# Patient Record
Sex: Female | Born: 1945
Health system: Southern US, Community
[De-identification: ages and names within clinical notes are randomized; demographics above are authoritative.]

## PROBLEM LIST (undated history)

## (undated) DIAGNOSIS — T7840XA Allergy, unspecified, initial encounter: Secondary | ICD-10-CM

## (undated) DIAGNOSIS — R112 Nausea with vomiting, unspecified: Secondary | ICD-10-CM

## (undated) DIAGNOSIS — I700 Atherosclerosis of aorta: Secondary | ICD-10-CM

## (undated) DIAGNOSIS — I10 Essential (primary) hypertension: Secondary | ICD-10-CM

## (undated) DIAGNOSIS — M1990 Unspecified osteoarthritis, unspecified site: Secondary | ICD-10-CM

## (undated) DIAGNOSIS — M5137 Other intervertebral disc degeneration, lumbosacral region: Secondary | ICD-10-CM

## (undated) DIAGNOSIS — M4800 Spinal stenosis, site unspecified: Secondary | ICD-10-CM

## (undated) DIAGNOSIS — Z8679 Personal history of other diseases of the circulatory system: Secondary | ICD-10-CM

## (undated) DIAGNOSIS — Z923 Personal history of irradiation: Secondary | ICD-10-CM

## (undated) DIAGNOSIS — C50919 Malignant neoplasm of unspecified site of unspecified female breast: Secondary | ICD-10-CM

## (undated) DIAGNOSIS — I517 Cardiomegaly: Secondary | ICD-10-CM

## (undated) DIAGNOSIS — Z9889 Other specified postprocedural states: Secondary | ICD-10-CM

## (undated) DIAGNOSIS — M51379 Other intervertebral disc degeneration, lumbosacral region without mention of lumbar back pain or lower extremity pain: Secondary | ICD-10-CM

## (undated) DIAGNOSIS — E0590 Thyrotoxicosis, unspecified without thyrotoxic crisis or storm: Secondary | ICD-10-CM

## (undated) DIAGNOSIS — Z9221 Personal history of antineoplastic chemotherapy: Secondary | ICD-10-CM

## (undated) DIAGNOSIS — I519 Heart disease, unspecified: Secondary | ICD-10-CM

## (undated) HISTORY — PX: OTHER SURGICAL HISTORY: SHX169

## (undated) HISTORY — DX: Unspecified osteoarthritis, unspecified site: M19.90

## (undated) HISTORY — PX: COLONOSCOPY: SHX174

## (undated) HISTORY — DX: Malignant neoplasm of unspecified site of unspecified female breast: C50.919

## (undated) HISTORY — DX: Allergy, unspecified, initial encounter: T78.40XA

---

## 1989-06-22 HISTORY — PX: LUMBAR LAMINECTOMY: SHX95

## 1990-06-22 HISTORY — PX: ABDOMINAL HYSTERECTOMY: SHX81

## 1996-06-22 HISTORY — PX: FOOT ARTHROPLASTY: SHX1657

## 1998-02-15 ENCOUNTER — Ambulatory Visit (HOSPITAL_COMMUNITY): Admission: RE | Admit: 1998-02-15 | Discharge: 1998-02-15 | Payer: Self-pay | Admitting: Podiatry

## 1999-08-22 ENCOUNTER — Other Ambulatory Visit: Admission: RE | Admit: 1999-08-22 | Discharge: 1999-08-22 | Payer: Self-pay | Admitting: Obstetrics & Gynecology

## 2000-07-22 ENCOUNTER — Ambulatory Visit (HOSPITAL_COMMUNITY): Admission: RE | Admit: 2000-07-22 | Discharge: 2000-07-22 | Payer: Self-pay | Admitting: Family Medicine

## 2000-07-22 ENCOUNTER — Encounter: Payer: Self-pay | Admitting: Family Medicine

## 2001-01-11 ENCOUNTER — Other Ambulatory Visit: Admission: RE | Admit: 2001-01-11 | Discharge: 2001-01-11 | Payer: Self-pay | Admitting: Obstetrics & Gynecology

## 2002-02-03 ENCOUNTER — Other Ambulatory Visit: Admission: RE | Admit: 2002-02-03 | Discharge: 2002-02-03 | Payer: Self-pay | Admitting: Obstetrics & Gynecology

## 2003-08-01 ENCOUNTER — Other Ambulatory Visit: Admission: RE | Admit: 2003-08-01 | Discharge: 2003-08-01 | Payer: Self-pay | Admitting: Obstetrics & Gynecology

## 2004-09-29 ENCOUNTER — Other Ambulatory Visit: Admission: RE | Admit: 2004-09-29 | Discharge: 2004-09-29 | Payer: Self-pay | Admitting: Obstetrics & Gynecology

## 2004-10-10 ENCOUNTER — Encounter: Admission: RE | Admit: 2004-10-10 | Discharge: 2004-10-10 | Payer: Self-pay | Admitting: Obstetrics & Gynecology

## 2005-12-09 ENCOUNTER — Encounter: Admission: RE | Admit: 2005-12-09 | Discharge: 2005-12-09 | Payer: Self-pay | Admitting: Obstetrics & Gynecology

## 2008-08-27 ENCOUNTER — Encounter: Admission: RE | Admit: 2008-08-27 | Discharge: 2008-08-27 | Payer: Self-pay | Admitting: Family Medicine

## 2008-09-11 ENCOUNTER — Encounter: Admission: RE | Admit: 2008-09-11 | Discharge: 2008-09-11 | Payer: Self-pay | Admitting: Family Medicine

## 2008-09-15 ENCOUNTER — Encounter: Admission: RE | Admit: 2008-09-15 | Discharge: 2008-09-15 | Payer: Self-pay | Admitting: Family Medicine

## 2009-04-26 ENCOUNTER — Encounter: Admission: RE | Admit: 2009-04-26 | Discharge: 2009-04-26 | Payer: Self-pay | Admitting: Family Medicine

## 2010-06-22 HISTORY — PX: KNEE ARTHROSCOPY: SHX127

## 2010-11-03 ENCOUNTER — Ambulatory Visit (HOSPITAL_BASED_OUTPATIENT_CLINIC_OR_DEPARTMENT_OTHER)
Admission: RE | Admit: 2010-11-03 | Discharge: 2010-11-03 | Disposition: A | Discharge status: Home / Self Care | Payer: BC Managed Care – PPO | Source: Ambulatory Visit | Attending: Specialist | Admitting: Specialist

## 2010-11-03 DIAGNOSIS — M23349 Other meniscus derangements, anterior horn of lateral meniscus, unspecified knee: Secondary | ICD-10-CM | POA: Insufficient documentation

## 2010-11-03 DIAGNOSIS — M23329 Other meniscus derangements, posterior horn of medial meniscus, unspecified knee: Secondary | ICD-10-CM | POA: Insufficient documentation

## 2010-11-03 DIAGNOSIS — M1710 Unilateral primary osteoarthritis, unspecified knee: Secondary | ICD-10-CM | POA: Insufficient documentation

## 2010-11-03 DIAGNOSIS — Z01810 Encounter for preprocedural cardiovascular examination: Secondary | ICD-10-CM | POA: Insufficient documentation

## 2010-11-03 DIAGNOSIS — Z79899 Other long term (current) drug therapy: Secondary | ICD-10-CM | POA: Insufficient documentation

## 2010-11-03 DIAGNOSIS — Z01812 Encounter for preprocedural laboratory examination: Secondary | ICD-10-CM | POA: Insufficient documentation

## 2010-11-03 LAB — POCT I-STAT 4, (NA,K, GLUC, HGB,HCT)
Hemoglobin: 13.9 g/dL (ref 12.0–15.0)
Sodium: 141 mEq/L (ref 135–145)

## 2010-11-09 NOTE — Op Note (Signed)
NAMECESAR, Monica [name]             ACCOUNT NO.:  [id number]  MEDICAL RECORD NO.:  [id number]            PATIENT TYPE:  LOCATION:                                FACILITY:  [facility]  PHYSICIAN:  Jene Every, M.D.    DATE OF BIRTH:  09/28/1945  DATE OF PROCEDURE:  11/03/2010 DATE OF DISCHARGE:                              OPERATIVE REPORT   PREOPERATIVE DIAGNOSES:  Medial and lateral meniscus tears, degenerative joint disease of the right knee.  POSTOPERATIVE DIAGNOSES:  Medial and lateral meniscus tears, degenerative joint disease of the right knee.  PROCEDURES PERFORMED: 1. Right knee arthroscopy. 2. Partial medial and lateral meniscectomy. 3. Chondroplasty of patellofemoral joint.  ANESTHESIA:  General.  ASSISTANT:  None.  BRIEF HISTORY:  This is a pleasant 65 year old female who has had knee pain, locking, and giving way.  MRI indicating meniscus tears both of the anterior horn of lateral meniscus and the medial meniscus with a flap tear.  She has had mechanical symptoms, large effusion, locking and giving way refractory to conservative treatment, indicated for arthroscopic debridement and partial meniscectomy.  Risk and benefits discussed including bleeding, infection, damage to neurovascular structures, no change in symptoms, worsening symptoms, need for repeat debridement, DVT, PE, anesthetic complications, etc.  TECHNIQUE:  The patient in supine position.  After induction of adequate general anesthesia, 2 g of Kefzol, the right lower extremity was prepped and draped in the usual sterile fashion.  A lateral parapatellar portal and a superomedial parapatellar portal was fashioned with a #11 blade. Ingress cannula atraumatically placed.  Irrigant was utilized to insufflate the joint.  Under direct visualization, medial parapatellar portal was fashioned with a #11 blade after localization with 18-gauge needle sparing the medial meniscus.  Noted  immediately was a tear  in the posterior and medial aspects of the medial meniscus.  This was resected with straight basket and further contoured with a 3.5 Cuda shaver to a stable base.  In addition, she had some couple of areas of a couple grade 3 lesions of the medial femoral condyle, near full- thickness measuring 1.5 x 1.5 cm.  We trimmed the edges of that to a stable base.  Next, after re-examining the compartment, the remnant of the meniscus was stable to probe palpation.  We examined the lateral compartment, was fairly tight in the lateral compartment, somewhat difficult to gain entry, we were able to do so though.  She had tearing along the anterior and medial aspects of the meniscus.  This was debrided with a shaver to a stable base.  We resected it to a stable base.  The remnant was stable to probe palpation.  She had some minor grade 3 changes on the femoral condyle and tibial plateau here as well, synovitis in the inner notch. Remnant of meniscus was stable to probe palpation.  Suprapatellar pouch with grade 3 changes of patella, normal patellofemoral tracking.  Light chondroplasty performed here.  We copiously lavaged the knee.  All compartments reexamined.  No further pathology amenable to arthroscopic intervention.  Therefore, we removed all instrumentation.  I flexed and extended the knee, attempted to perform a  McMurray, I had no locking or popping felt particularly in the lateral compartment or medial compartment, therefore closed the portals with 4-0 nylon simple sutures.  0.25% Marcaine with epinephrine was infiltrated in the joint.  Wound was dressed sterilely.  Awoken without difficulty and transported to recovery room in satisfactory condition.  The patient tolerated procedure well.  No complications.  No assistant.     Jene Every, M.D.     Cordelia Pen  D:  11/03/2010  T:  11/03/2010  Job:  [id number]  Electronically Signed by Jene Every M.D. on 11/09/2010 01:30:59 PM

## 2011-06-05 ENCOUNTER — Other Ambulatory Visit: Payer: Self-pay | Admitting: Family Medicine

## 2011-06-05 DIAGNOSIS — M79602 Pain in left arm: Secondary | ICD-10-CM

## 2011-06-09 ENCOUNTER — Ambulatory Visit
Admission: RE | Admit: 2011-06-09 | Discharge: 2011-06-09 | Disposition: A | Discharge status: Home / Self Care | Payer: BC Managed Care – PPO | Source: Ambulatory Visit | Attending: Family Medicine | Admitting: Family Medicine

## 2011-06-09 DIAGNOSIS — M79602 Pain in left arm: Secondary | ICD-10-CM

## 2013-06-22 DIAGNOSIS — Z923 Personal history of irradiation: Secondary | ICD-10-CM

## 2013-06-22 DIAGNOSIS — Z9221 Personal history of antineoplastic chemotherapy: Secondary | ICD-10-CM

## 2013-06-22 HISTORY — DX: Personal history of irradiation: Z92.3

## 2013-06-22 HISTORY — DX: Personal history of antineoplastic chemotherapy: Z92.21

## 2014-01-02 ENCOUNTER — Other Ambulatory Visit: Payer: Self-pay | Admitting: Obstetrics & Gynecology

## 2014-01-02 DIAGNOSIS — N63 Unspecified lump in unspecified breast: Secondary | ICD-10-CM

## 2014-01-05 ENCOUNTER — Other Ambulatory Visit: Payer: Self-pay | Admitting: Obstetrics & Gynecology

## 2014-01-05 ENCOUNTER — Ambulatory Visit
Admission: RE | Admit: 2014-01-05 | Discharge: 2014-01-05 | Disposition: A | Discharge status: Home / Self Care | Payer: Medicare (Managed Care) | Source: Ambulatory Visit | Attending: Obstetrics & Gynecology | Admitting: Obstetrics & Gynecology

## 2014-01-05 DIAGNOSIS — N63 Unspecified lump in unspecified breast: Secondary | ICD-10-CM

## 2014-01-05 DIAGNOSIS — N6320 Unspecified lump in the left breast, unspecified quadrant: Secondary | ICD-10-CM

## 2014-01-08 ENCOUNTER — Other Ambulatory Visit: Payer: Self-pay | Admitting: Obstetrics & Gynecology

## 2014-01-08 DIAGNOSIS — N6320 Unspecified lump in the left breast, unspecified quadrant: Secondary | ICD-10-CM

## 2014-01-10 ENCOUNTER — Ambulatory Visit
Admission: RE | Admit: 2014-01-10 | Discharge: 2014-01-10 | Disposition: A | Discharge status: Home / Self Care | Payer: Medicare (Managed Care) | Source: Ambulatory Visit | Attending: Obstetrics & Gynecology | Admitting: Obstetrics & Gynecology

## 2014-01-10 ENCOUNTER — Ambulatory Visit
Admission: RE | Admit: 2014-01-10 | Discharge: 2014-01-10 | Disposition: A | Discharge status: Home / Self Care | Payer: BC Managed Care – PPO | Source: Ambulatory Visit | Attending: Obstetrics & Gynecology | Admitting: Obstetrics & Gynecology

## 2014-01-10 DIAGNOSIS — N6320 Unspecified lump in the left breast, unspecified quadrant: Secondary | ICD-10-CM

## 2014-01-10 DIAGNOSIS — C50919 Malignant neoplasm of unspecified site of unspecified female breast: Secondary | ICD-10-CM

## 2014-01-10 HISTORY — DX: Malignant neoplasm of unspecified site of unspecified female breast: C50.919

## 2014-01-11 ENCOUNTER — Other Ambulatory Visit: Payer: Self-pay | Admitting: Obstetrics & Gynecology

## 2014-01-11 DIAGNOSIS — C50919 Malignant neoplasm of unspecified site of unspecified female breast: Secondary | ICD-10-CM

## 2014-01-12 ENCOUNTER — Telehealth: Payer: Self-pay | Admitting: *Deleted

## 2014-01-12 DIAGNOSIS — C50512 Malignant neoplasm of lower-outer quadrant of left female breast: Secondary | ICD-10-CM

## 2014-01-12 NOTE — Telephone Encounter (Signed)
Confirmed [facility] for 01/17/14 at 12N .  Instructions and contact information given. 

## 2014-01-16 ENCOUNTER — Ambulatory Visit
Admission: RE | Admit: 2014-01-16 | Discharge: 2014-01-16 | Disposition: A | Discharge status: Home / Self Care | Payer: Medicare (Managed Care) | Source: Ambulatory Visit | Attending: Obstetrics & Gynecology | Admitting: Obstetrics & Gynecology

## 2014-01-16 DIAGNOSIS — C50919 Malignant neoplasm of unspecified site of unspecified female breast: Secondary | ICD-10-CM

## 2014-01-16 MED ORDER — GADOBENATE DIMEGLUMINE 529 MG/ML IV SOLN
20.0000 mL | Freq: Once | INTRAVENOUS | Status: AC | PRN
  Administered 2014-01-16: 20 mL via INTRAVENOUS

## 2014-01-17 ENCOUNTER — Ambulatory Visit
Admission: RE | Admit: 2014-01-17 | Discharge: 2014-01-17 | Disposition: A | Discharge status: Home / Self Care | Payer: BC Managed Care – PPO | Source: Ambulatory Visit | Attending: Radiation Oncology | Admitting: Radiation Oncology

## 2014-01-17 ENCOUNTER — Other Ambulatory Visit (HOSPITAL_BASED_OUTPATIENT_CLINIC_OR_DEPARTMENT_OTHER): Payer: Medicare (Managed Care)

## 2014-01-17 ENCOUNTER — Encounter: Payer: Self-pay | Admitting: Oncology

## 2014-01-17 ENCOUNTER — Ambulatory Visit (HOSPITAL_BASED_OUTPATIENT_CLINIC_OR_DEPARTMENT_OTHER): Payer: BC Managed Care – PPO | Admitting: General Surgery

## 2014-01-17 ENCOUNTER — Ambulatory Visit: Payer: Medicare (Managed Care)

## 2014-01-17 ENCOUNTER — Encounter: Payer: Self-pay | Admitting: *Deleted

## 2014-01-17 ENCOUNTER — Ambulatory Visit (HOSPITAL_BASED_OUTPATIENT_CLINIC_OR_DEPARTMENT_OTHER): Payer: Medicare (Managed Care) | Admitting: Oncology

## 2014-01-17 ENCOUNTER — Ambulatory Visit: Payer: MEDICARE | Attending: General Surgery | Admitting: Physical Therapy

## 2014-01-17 VITALS — BP 148/70 | HR 68 | Temp 98.7°F | Resp 18 | Ht 66.5 in | Wt 253.9 lb

## 2014-01-17 DIAGNOSIS — C50512 Malignant neoplasm of lower-outer quadrant of left female breast: Secondary | ICD-10-CM

## 2014-01-17 DIAGNOSIS — I10 Essential (primary) hypertension: Secondary | ICD-10-CM | POA: Insufficient documentation

## 2014-01-17 DIAGNOSIS — R293 Abnormal posture: Secondary | ICD-10-CM | POA: Diagnosis not present

## 2014-01-17 DIAGNOSIS — IMO0001 Reserved for inherently not codable concepts without codable children: Secondary | ICD-10-CM | POA: Insufficient documentation

## 2014-01-17 DIAGNOSIS — C50519 Malignant neoplasm of lower-outer quadrant of unspecified female breast: Secondary | ICD-10-CM

## 2014-01-17 DIAGNOSIS — C50919 Malignant neoplasm of unspecified site of unspecified female breast: Secondary | ICD-10-CM | POA: Diagnosis not present

## 2014-01-17 DIAGNOSIS — M1710 Unilateral primary osteoarthritis, unspecified knee: Secondary | ICD-10-CM | POA: Insufficient documentation

## 2014-01-17 LAB — CBC WITH DIFFERENTIAL/PLATELET
BASO%: 0.8 % (ref 0.0–2.0)
Basophils Absolute: 0 10*3/uL (ref 0.0–0.1)
EOS ABS: 0.1 10*3/uL (ref 0.0–0.5)
EOS%: 2.3 % (ref 0.0–7.0)
HCT: 39.1 % (ref 34.8–46.6)
HGB: 13 g/dL (ref 11.6–15.9)
LYMPH%: 38.2 % (ref 14.0–49.7)
MCH: 27.7 pg (ref 25.1–34.0)
MCHC: 33.2 g/dL (ref 31.5–36.0)
MCV: 83.4 fL (ref 79.5–101.0)
MONO#: 0.5 10*3/uL (ref 0.1–0.9)
MONO%: 8.9 % (ref 0.0–14.0)
NEUT%: 49.8 % (ref 38.4–76.8)
NEUTROS ABS: 2.6 10*3/uL (ref 1.5–6.5)
Platelets: 286 10*3/uL (ref 145–400)
RBC: 4.69 10*6/uL (ref 3.70–5.45)
RDW: 14.8 % — AB (ref 11.2–14.5)
WBC: 5.2 10*3/uL (ref 3.9–10.3)
lymph#: 2 10*3/uL (ref 0.9–3.3)

## 2014-01-17 LAB — COMPREHENSIVE METABOLIC PANEL (CC13)
ALBUMIN: 3.3 g/dL — AB (ref 3.5–5.0)
ALK PHOS: 82 U/L (ref 40–150)
ALT: 14 U/L (ref 0–55)
AST: 20 U/L (ref 5–34)
Anion Gap: 8 mEq/L (ref 3–11)
BILIRUBIN TOTAL: 0.26 mg/dL (ref 0.20–1.20)
BUN: 12.7 mg/dL (ref 7.0–26.0)
CO2: 27 mEq/L (ref 22–29)
Calcium: 9.5 mg/dL (ref 8.4–10.4)
Chloride: 105 mEq/L (ref 98–109)
Creatinine: 0.9 mg/dL (ref 0.6–1.1)
GLUCOSE: 126 mg/dL (ref 70–140)
POTASSIUM: 3.3 meq/L — AB (ref 3.5–5.1)
SODIUM: 140 meq/L (ref 136–145)
TOTAL PROTEIN: 7.4 g/dL (ref 6.4–8.3)

## 2014-01-17 NOTE — Progress Notes (Signed)
Chief Complaint: New diagnosis of breast cancer  History:    Monica [name] is a 68 y.o. postmenopausal female referred by Dr. Shirlyn Goltz  for evaluation of recently diagnosed carcinoma of the left breast. She recently presented for a screening mamogram revealing 2 apparent masses in the lateral aspect of the left breast.  Subsequent imaging included diagnostic mamogram showing 2 adjacent oval poorly defined masses deep in the lower outer portion of the left breast. and ultrasound showing 2 irregular hypoechoic masses at the 4:00 position of the left breast one 20 cm from the nipple measuring 1.5 cm and another 16 cm from the nipple measuring 1 cm. 2 left axillary lymph nodes were seen with mild cortical thickening..   A ultrasound guided biopsy was performed on 01/12/2014 of the 2 breast masses and one abnormal axillary lymph node with pathology revealing infiltrating ductal carcinoma of the breast In both masses but axillary lymph node biopsy was negative for malignancy. Subsequent bilateral breast MRI was performed showing 2 masses corresponding to the known malignancies and postbiopsy change in the axilla with a mildly prominent left axillary node. The 2 masses or adjacent and total area encompassed is approximately 4 cm.. She is seen now in Breast multidisciplinary clinic for initial treatment planning.  She has experienced No breast symptoms personally..  She does not have a personal history of any previous breast problems.  Findings at that time were the following:  Tumor size: 1.4 and 1.9 cm encompassing a total of 4 cm  Tumor grade: 3  Estrogen Receptor: negative Progesterone Receptor: negative  Her-2 neu: negative  Lymph node status: negative  Past Medical History  Diagnosis Date  . Allergy codeine    rapid heart beat, cold sweat  . Arthritis   . Breast cancer     No past surgical history on file.  Current Outpatient Prescriptions  Medication Sig Dispense Refill  .  amLODipine (NORVASC) 5 MG tablet Take 5 mg by mouth daily.      . Cholecalciferol (VITAMIN D3) 2000 UNITS TABS Take 1 tablet by mouth daily.      [name] docusate sodium (COLACE) 100 MG capsule Take 100 mg by mouth 2 (two) times daily.      [name] estradiol (ESTRACE) 1 MG tablet Take 2 mg by mouth daily.      . Ginkgo Biloba 120 MG CAPS Take 1 capsule by mouth daily.      . meloxicam (MOBIC) 15 MG tablet Take 15 mg by mouth daily as needed for pain.      [name] UNABLE TO FIND Take 1 tablet by mouth daily. OMEGA XL      . valsartan-hydrochlorothiazide (DIOVAN-HCT) 320-25 MG per tablet Take 1 tablet by mouth daily.       No current facility-administered medications for this visit.    Family History  Problem Relation Age of Onset  . Cancer Sister   . Cancer Brother   . Cancer Maternal Aunt   . Cancer Paternal Aunt     History   Social History  . Marital Status: Married    Spouse Name: N/A    Number of Children: N/A  . Years of Education: N/A   Social History Main Topics  . Smoking status: Never Smoker   . Smokeless tobacco: Not on file  . Alcohol Use: No  . Drug Use: Not on file  . Sexual Activity: Not on file   Other Topics Concern  . Not on file   Social  History Narrative  . No narrative on file     Review of Systems Constitutional: negative Respiratory: negative Cardiovascular: negative Gastrointestinal: negative     Objective:  There were no vitals taken for this visit.  General: Alert, Obese African American female, in no distress Skin: Warm and dry without rash or infection. HEENT: No palpable masses or thyromegaly. Sclera nonicteric. Pupils equal round and reactive. Oropharynx clear. Breasts: There is a palpable mass in the extreme lateral aspect of the left breast measuring in total approximately 4-5 cm anterior to posterior. May represent some postbiopsy change. Lymph nodes: No cervical, supraclavicular, or inguinal nodes palpable. Lungs: Breath sounds clear and equal  without increased work of breathing Cardiovascular: Regular rate and rhythm without murmur. No JVD or edema. Peripheral pulses intact. Abdomen: Nondistended. Soft and nontender. No masses palpable. No organomegaly. No palpable hernias. Extremities: No edema or joint swelling or deformity. No chronic venous stasis changes. Neurologic: Alert and fully oriented. Gait normal.   Laboratory data:  CBC:  Lab Results  Component Value Date   WBC 5.2 01/17/2014   RBC 4.69 01/17/2014   HGB 13.0 01/17/2014   HGB 13.9 11/03/2010   HCT 39.1 01/17/2014   HCT 41.0 11/03/2010   PLT 286 01/17/2014  ]  [facility] Labs:  Lab Results  Component Value Date   NA 140 01/17/2014   NA 141 11/03/2010   K 3.3* 01/17/2014   K 3.3* 11/03/2010   CO2 27 01/17/2014   BUN 12.7 01/17/2014   CREATININE 0.9 01/17/2014   CALCIUM 9.5 01/17/2014   PROT 7.4 01/17/2014   BILITOT 0.26 01/17/2014   ALKPHOS 82 01/17/2014   AST 20 01/17/2014   ALT 14 01/17/2014     Assessment  68 y.o. female with a new diagnosis of cancer of the the left breast lower outer quadrant.  Clinical IIA, estrogen receptor negative, progesterone receptor negative and Her2/Neu protein/oncogene negative. Estrogen receptor is going to be repeated although was felt to likely be negative. I discussed with the patient and family members present today initial surgical treatment options. We discussed options of breast conservation with lumpectomy or total mastectomy and sentinal lymph node biopsy/dissection. Options for reconstruction were discussed. Although she has 2 separate masses they are small and adjacent and could be encompassed with a reasonable sized partial mastectomy in an area where there is plenty of breast tissue. After discussion they have elected to proceed with left partial mastectomy.  We discussed the indications and nature of the procedure, and expected recovery, in detail. Surgical risks including anesthetic complications, cardiorespiratory complications,  bleeding, infection, wound healing complications, blood clots, lymphedema, local and distant recurrence and possible need for further surgery based on the final pathology was discussed and understood.  Chemotherapy, hormonal therapy and radiation therapy have been discussed. They have been provided with literature regarding the treatment of breast cancer.  All questions were answered. They understand and agree to proceed and we will go ahead with scheduling.  Plan left partial mastectomy Left axillary sentinel lymph node biopsy. Will ask for the 2 lesions to be bracketed. She may require chemotherapy if she is triple negative and we discussed potentially placing a Port-A-Cath at the time of her breast surgery.  [name] MD, FACS  01/17/2014, 3:34 PM

## 2014-01-17 NOTE — Progress Notes (Signed)
Checked in new patient with no financial issues prior to seeing the dr. She has appt card and I advised of [employer] [facility]-she said may not qualify for it. She has not been out of the country and I gave her [facility] packet.

## 2014-01-17 NOTE — Progress Notes (Signed)
[place]  Telephone:(336) [phone] Fax:(336) [phone]     ID: SAHEJ HAUSWIRTH DOB: 10-01-1945  MR#: [id number]  HBZ#:[id number]  Patient Care Team: Maury Dus, MD as PCP - General (Family Medicine) [name], MD as Consulting Physician (General Surgery) Chauncey Cruel, MD as Consulting Physician (Oncology) Marye Round, MD as Consulting Physician (Radiation Oncology) Maisie Fus, MD as Consulting Physician (Obstetrics and Gynecology)   CHIEF COMPLAINT: Newly diagnosed breast cancer  CURRENT TREATMENT: Awaiting definitive surgery   BREAST CANCER HISTORY: [facility] showing a possible mass in her left breast. On 01/05/2014 left diagnostic mammography and ultrasonography at the breast Center showed 2 adjacent poorly defined masses deep in the lower outer portion of the left breast. On physical exam these were palpable measuring approximately 3 cm altogether, at the 4:00 position 16 cm from the left nipple. There were no palpable left axillary lymph nodes. Ultrasound confirmed to and adjacent irregularly marginated hypoechoic masses in the area in question, measuring 1.5 and 1.0 cm respectively. The total area of by ultrasonography measured 2.6 cm. In addition, to left axillary lymph nodes showed mild cortical thickening in one of them showed loss of the normal fatty hilum.  On 01/10/2014 the patient underwent separate biopsies of both the left breast masses in question as well as the more suspicious lymph node. The final pathology ([id number]) showed the lymph node to be benign. (This was interpreted as concordant). Both of the breast masses, however, were positive for an invasive ductal carcinoma, which was HER-2 not amplified, with the signals ratio of 0.98 and the number per cell being 2.45. Estrogen and progesterone receptor determination is pending but looks negative at this point.  On  01/16/2014 the patient underwent bilateral breast MRI the right breast was unremarkable. In the left breast lower outer quadrant there were 2 oval masses measuring 1.5 and 2.2 cm. Taken together they was approximately 4 cm of disease. There were no findings of concern in the right breast. In the left axilla there was a single mildly prominent lymph node. There was no suspicious internal mammary adenopathy.  The patient's subsequent history is as detailed below  INTERVAL HISTORY: Marcelle was evaluated in the multidisciplinary breast cancer clinic 01/17/2014 accompanied by her husband [name] and her. God-daughter [name]. The patient's case was also discussed at the multidisciplinary breast cancer conference the same morning  REVIEW OF SYSTEMS: There were no specific symptoms leading to the original mammogram, which was routinely scheduled. The patient denies unusual headaches, visual changes, nausea, vomiting, stiff neck, dizziness, or gait imbalance. There has been no cough, phlegm production, or pleurisy, no chest pain or pressure, and no change in bowel or bladder habits. The patient denies fever, rash, bleeding, unexplained fatigue or unexplained weight loss. The patient admits to some joint pain "here and there", which is not more persistent or intense than prior. A detailed review of systems was otherwise entirely negative.  PAST MEDICAL HISTORY: Past Medical History  Diagnosis Date  . Allergy codeine    rapid heart beat, cold sweat  . Arthritis   . Breast cancer     PAST SURGICAL HISTORY: No past surgical history on file.  FAMILY HISTORY Family History  Problem Relation Age of Onset  . Cancer Sister   . Cancer Brother   . Cancer Maternal Aunt   . Cancer Paternal Aunt    the patient's father died at the age of 75 from  a stroke. The patient's mother died at the age of 43 from "multiple causes". The patient had 2 brothers and 2 sisters. The patient's sister was diagnosed with uterine  cancer at the age of 2, and the patient's brother [name] had "bone cancer" and died in his 43s. He was treated at the [facility]. There is no history of breast or ovarian cancer in the family to the patient's knowledge.  GYNECOLOGIC HISTORY:  No LMP recorded. Menarche age 70, the patient is GX P0. She underwent hysterectomy in 1992, with no salpingo-oophorectomy. She has been on estrogen replacement since. 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 Her husband [name] worked for [employer] in [place] as a Facilities manager. The patient's adopted son [name] works as a laborer in [place]. The patient's god-daughter [name] is an Optometrist. The patient has no grandchildren. The patient attends a local church of  [place], and her husband [name] is pastor    ADVANCED DIRECTIVES: In place   HEALTH MAINTENANCE: History  Substance Use Topics  . Smoking status: Never Smoker   . Smokeless tobacco: Not on file  . Alcohol Use: No     Colonoscopy: October 2008  PAP: July 2015  Bone density: 01/01/2014  Lipid panel:  Allergies  Allergen Reactions  . Codeine     Current Outpatient Prescriptions  Medication Sig Dispense Refill  . amLODipine (NORVASC) 5 MG tablet Take 5 mg by mouth daily.      . Cholecalciferol (VITAMIN D3) 2000 UNITS TABS Take 1 tablet by mouth daily.      [name] docusate sodium (COLACE) 100 MG capsule Take 100 mg by mouth 2 (two) times daily.      [name] estradiol (ESTRACE) 1 MG tablet Take 2 mg by mouth daily.      . Ginkgo Biloba 120 MG CAPS Take 1 capsule by mouth daily.      . meloxicam (MOBIC) 15 MG tablet Take 15 mg by mouth daily as needed for pain.      [name] UNABLE TO FIND Take 1 tablet by mouth daily. OMEGA XL      . valsartan-hydrochlorothiazide (DIOVAN-HCT) 320-25 MG per tablet Take 1 tablet by mouth daily.       No current  facility-administered medications for this visit.    OBJECTIVE: Middle-aged [place] American woman in no acute distress Filed Vitals:   01/17/14 1248  BP: 148/70  Pulse: 68  Temp: 98.7 F (37.1 C)  Resp: 18     Body mass index is 40.37 kg/(m^2).    ECOG FS:0 - Asymptomatic  Ocular: Sclerae unicteric, pupils equal, round and reactive to light Ear-nose-throat: Oropharynx clear and moist Lymphatic: No cervical or supraclavicular adenopathy Lungs no rales or rhonchi, good excursion bilaterally Heart regular rate and rhythm, no murmur appreciated Abd soft, obese, nontender, positive bowel sounds MSK no focal spinal tenderness, no joint edema Neuro: non-focal, well-oriented, appropriate affect Breasts: The right breast is unremarkable. There is a small ecchymosis in the left breast lower outer quadrant. I do not palpate a well-defined mass. There are no other skin or nipple changes of concern. The left axilla is benign.   LAB RESULTS:  CMP     Component Value Date/Time   NA 140 01/17/2014 1219   NA 141 11/03/2010 1352   K 3.3* 01/17/2014 1219   K 3.3* 11/03/2010 1352   CO2 27  01/17/2014 1219   GLUCOSE 126 01/17/2014 1219   GLUCOSE 83 11/03/2010 1352   BUN 12.7 01/17/2014 1219   CREATININE 0.9 01/17/2014 1219   CALCIUM 9.5 01/17/2014 1219   PROT 7.4 01/17/2014 1219   ALBUMIN 3.3* 01/17/2014 1219   AST 20 01/17/2014 1219   ALT 14 01/17/2014 1219   ALKPHOS 82 01/17/2014 1219   BILITOT 0.26 01/17/2014 1219    I No results found for this basename: SPEP,  UPEP,   kappa and lambda light chains    Lab Results  Component Value Date   WBC 5.2 01/17/2014   NEUTROABS 2.6 01/17/2014   HGB 13.0 01/17/2014   HCT 39.1 01/17/2014   MCV 83.4 01/17/2014   PLT 286 01/17/2014      Chemistry      Component Value Date/Time   NA 140 01/17/2014 1219   NA 141 11/03/2010 1352   K 3.3* 01/17/2014 1219   K 3.3* 11/03/2010 1352   CO2 27 01/17/2014 1219   BUN 12.7 01/17/2014 1219   CREATININE 0.9 01/17/2014 1219       Component Value Date/Time   CALCIUM 9.5 01/17/2014 1219   ALKPHOS 82 01/17/2014 1219   AST 20 01/17/2014 1219   ALT 14 01/17/2014 1219   BILITOT 0.26 01/17/2014 1219       No results found for this basename: LABCA2    No components found with this basename: LABCA125    No results found for this basename: INR,  in the last 168 hours  Urinalysis No results found for this basename: colorurine,  appearanceur,  labspec,  phurine,  glucoseu,  hgbur,  bilirubinur,  ketonesur,  proteinur,  urobilinogen,  nitrite,  leukocytesur    STUDIES: Mr Breast Bilateral W Wo Contrast  01/16/2014   ADDENDUM REPORT: 01/16/2014 12:16  ADDENDUM: The enhancing masses in the left breast measure 1.4 x 1.5 x 1.6 cm (more anterior lesion) and 1.9 x 2.2 x 1.6 cm (more posterior lesion). Measured together there is approximately 4.0 cm of disease measured anterior to posterior, inclusive of both lesions.   Electronically Signed   By: Shon Hale M.D.   On: 01/16/2014 12:16   01/16/2014   CLINICAL DATA:  Recent left breast biopsy shows grade 3 invasive ductal carcinoma at 2 sites in the left breast 4 o'clock location, 16 and 20 cm from the nipple. A left axillary lymph node was biopsied and was benign.  LABS:  BUN and creatinine were obtained on site at [facility] at  [address]  Results:  BUN 14 mg/dL,  Creatinine 0.9 mg/dL.  EXAM: BILATERAL BREAST MRI WITH AND WITHOUT CONTRAST  TECHNIQUE: Multiplanar, multisequence MR images of both breasts were obtained prior to and following the intravenous administration of 60m of MultiHance.  THREE-DIMENSIONAL MR IMAGE RENDERING ON INDEPENDENT WORKSTATION:  Three-dimensional MR images were rendered by post-processing of the original MR data on an independent workstation. The three-dimensional MR images were interpreted, and findings are reported in the following complete MRI report for this study. Three dimensional images were evaluated at the independent DynaCad  workstation  COMPARISON:  [facility] and earlier  FINDINGS: Breast composition: c:  Heterogeneous fibroglandular tissue  Background parenchymal enhancement: Moderate  Right breast: A skin lesion in the the lower central portion of the right breast shows enhancement, consistent with a skin lesion previously marked mammographically. No suspicious  parenchymal abnormality identified.  Left breast: Within the lower outer quadrant of the left breast there are 2 oval masses with irregular margins. Each of these demonstrates homogeneous internal enhancement and shows rapid wash-in and washout type enhancement kinetics. Each mass contains tissue marker clip artifact from recent core biopsies. No other suspicious enhancement identified within the left breast.  Lymph nodes: There is post biopsy change within the left axilla following recent ultrasound-guided core biopsy of a left axillary lymph node. There is a single mildly prominent left axillary lymph node. Other are nodes have normal morphology. No suspicious internal mammary nodes are identified.  Ancillary findings:  None.  IMPRESSION: 1. Two masses within the lower outer quadrant of the left breast consistent with known malignancy based on recent core biopsies. 2. No evidence for contralateral malignancy.  RECOMMENDATION: Treatment plan is recommended.  BI-RADS CATEGORY  6: Known biopsy-proven malignancy.  Electronically Signed: By: Shon Hale M.D. On: 01/16/2014 11:58   Mm Digital Diagnostic Unilat L  01/10/2014   CLINICAL DATA:  Post left breast ultrasound-guided core biopsies.  EXAM: DIAGNOSTIC left breast MAMMOGRAM POST ULTRASOUND BIOPSY  COMPARISON:  Previous examinations.  FINDINGS: Mammographic images were obtained following ultrasound guided biopsy of left breast masses located at the 4 o'clock position 16 cm and 20 cm from  the nipple. The ribbon shaped clip is in appropriate position associated with the mass located at the 4 o'clock position 16 cm from the nipple. The wing shaped clip is in appropriate position associated with the mass located at the 4 o'clock position 20 cm from the nipple.  IMPRESSION: Appropriate positioning of clips as discussed above following ultrasound-guided core biopsy.  Final Assessment: Post Procedure Mammograms for Marker Placement   Electronically Signed   By: Luberta Robertson M.D.   On: 01/10/2014 13:10   Mm Digital Diagnostic Unilat L  01/05/2014   CLINICAL DATA:  Possible left breast masses at recent screening mammography. The patient also reports that her physician felt a mass in the 5 o'clock position of the left breast following her screening mammogram.  EXAM: DIGITAL DIAGNOSTIC  LEFT MAMMOGRAM WITH CAD  ULTRASOUND LEFT BREAST  COMPARISON:  Previous examinations, including the screening mammogram performed at [facility] on 01/01/2014.  ACR Breast Density Category b: There are scattered areas of fibroglandular density.  FINDINGS: Spot compression, spot tangential and laterally exaggerated craniocaudal views of the left breast were obtained. These demonstrate two adjacent oval, poorly defined masses deep in the lower outer portion of the left breast.  Mammographic images were processed with CAD.  On physical exam, there is an approximately 3 x 1.5 cm palpable mass in the 4 o'clock position of the left breast, 16-20 cm from the nipple. There are no palpable left axillary lymph nodes.  Ultrasound is performed, showing two adjacent rounded, irregularly marginated hypoechoic masses in the 4 o'clock position of the left breast. One is 20 cm from the nipple and measures 1.5 x 1.4 x 1.2 cm. The other is 16 cm from the nipple and measures 1.0 x 0.8 x 0.8 cm. There are also 2 left axillary lymph nodes with mild diffuse cortical thickening. One of these demonstrates loss of the normal  fatty hilum.  IMPRESSION: Two left breast masses highly suspicious for malignancy and two left axillary lymph nodes suspicious for metastatic adenopathy.  RECOMMENDATION: Ultrasound-guided core needle biopsies of the two left breast masses and one of the left axillary lymph nodes. These have been scheduled for 10  a.m. on 01/10/2014.  I have discussed the findings and recommendations with the patient. Results were also provided in writing at the conclusion of the visit. If applicable, a reminder letter will be sent to the patient regarding the next appointment.  BI-RADS CATEGORY  5: Highly suggestive of malignancy.   Electronically Signed   By: Enrique Sack M.D.   On: 01/05/2014 08:31   US Breast Ltd Uni Left Inc Axilla  01/05/2014   CLINICAL DATA:  Possible left breast masses at recent screening mammography. The patient also reports that her physician felt a mass in the 5 o'clock position of the left breast following her screening mammogram.  EXAM: DIGITAL DIAGNOSTIC  LEFT MAMMOGRAM WITH CAD  ULTRASOUND LEFT BREAST  COMPARISON:  Previous examinations, including the screening mammogram performed at [facility] on 01/01/2014.  ACR Breast Density Category b: There are scattered areas of fibroglandular density.  FINDINGS: Spot compression, spot tangential and laterally exaggerated craniocaudal views of the left breast were obtained. These demonstrate two adjacent oval, poorly defined masses deep in the lower outer portion of the left breast.  Mammographic images were processed with CAD.  On physical exam, there is an approximately 3 x 1.5 cm palpable mass in the 4 o'clock position of the left breast, 16-20 cm from the nipple. There are no palpable left axillary lymph nodes.  Ultrasound is performed, showing two adjacent rounded, irregularly marginated hypoechoic masses in the 4 o'clock position of the left breast. One is 20 cm from the nipple and measures 1.5 x 1.4 x 1.2 cm. The other is 16 cm  from the nipple and measures 1.0 x 0.8 x 0.8 cm. There are also 2 left axillary lymph nodes with mild diffuse cortical thickening. One of these demonstrates loss of the normal fatty hilum.  IMPRESSION: Two left breast masses highly suspicious for malignancy and two left axillary lymph nodes suspicious for metastatic adenopathy.  RECOMMENDATION: Ultrasound-guided core needle biopsies of the two left breast masses and one of the left axillary lymph nodes. These have been scheduled for 10 a.m. on 01/10/2014.  I have discussed the findings and recommendations with the patient. Results were also provided in writing at the conclusion of the visit. If applicable, a reminder letter will be sent to the patient regarding the next appointment.  BI-RADS CATEGORY  5: Highly suggestive of malignancy.   Electronically Signed   By: Enrique Sack M.D.   On: 01/05/2014 08:31   [place] Lt Breast Bx W Loc Dev Ea Add Lesion Img Bx Spec US Guide  01/16/2014   ADDENDUM REPORT: 01/15/2014 08:12  ADDENDUM: Pathology revealed grade 3 invasive ductal carcinoma in the left breast 4:00, 16 cm from the nipple and 20 cm from the nipple masses. The left axillary lymph node that was biopsied was benign. This was found to be concordant by Dr. Curlene Dolphin. Pathology was discussed with the patient by telephone and her questions were answered. Post biopsy instructions and care were reviewed. She reported doing well after the biopsies with tenderness at the sites. Appointments have been made for a bilateral breast MRI on January 16, 2014 and [facility] on January 17, 2014. She was encouraged to come to [facility] for educational materials. My number was provided for additional concerns and questions.  Pathology results reported by Susa Raring RN, BSN on January 15, 2014.   Electronically Signed   By: Kathryne Sharper.D.  On: 01/15/2014 08:12   01/16/2014   CLINICAL DATA:  Left breast masses  located at the 4 o'clock position 16 and 20 cm from the nipple and possible left axillary adenopathy. Ultrasound-guided core biopsy recommended.  EXAM: ULTRASOUND GUIDED LEFT AXILLARY LYMPH NODE BIOPSY  COMPARISON:  Previous exams.  FINDINGS: I met with the patient and we discussed the procedure of ultrasound-guided biopsy, including benefits and alternatives. We discussed the high likelihood of a successful procedure. We discussed the risks of the procedure, including infection, bleeding, tissue injury, clip migration, and inadequate sampling. Informed written consent was given. The usual time-out protocol was performed immediately prior to the procedure.  Using sterile technique and 2% Lidocaine as local anesthetic, under direct ultrasound visualization, a 14 gauge spring-loaded device was used to perform biopsy of the prominent level 1 left axillary lymph node with cortical thickening using a lateral approach. No clip was deployed within the lymph node.  IMPRESSION: Ultrasound guided biopsy of the prominent left axillary lymph node (level 1) as discussed above. No apparent complications.  Electronically Signed: By: Luberta Robertson M.D. On: 01/10/2014 13:08    ASSESSMENT: 68 y.o. [place] woman status post left breast biopsy of 2 separate lower outer quadrant masses 01/10/2014 for a multifocal cT2 cNX, stage II invasive ductal carcinoma, grade 3, with an MIB-1 of 90%, HER-2 negative, estrogen and progesterone receptor likely negative but definitive results pending.  (1) a suspicious left axillary lymph node biopsied 01/10/2014 was negative (concordant)  PLAN: We spent the better part of today's hour-long appointment discussing the biology of breast cancer in general, and the specifics of the patient's tumor in particular. We reviewed the difference between local and systemic treatment. From the point of view of local therapy, the patient will need surgery and radiation.   She will need systemic  treatment, and is not a candidate for anti-HER-2 immunotherapy. If the tumor is estrogen receptor positive, we will send an Oncotype to help with the chemotherapy decision. This was discussed extensively with the patient and her family. However if the tumor proves to be triple negative, as we expect, she will definitely need chemotherapy as that would be her only option for systemic treatment.  Accordingly we are setting her up for "chemotherapy school". Once we have the Oncotype report, is it confirms estrogen and progesterone negativity, we will ask that a port be placed at the time of surgery. The patient will see me again in approximately one month. By then we should be able to make a definitive decision regarding her systemic therapy, which will most likely consist of doxorubicin and cyclophosphamide in dose dense fashion x4 followed by weekly paclitaxel and carboplatin x12.  The patient has a good understanding of the overall plan. She agrees with it. She knows the goal of treatment in her case is cure. She will call with any problems that may develop before her next visit here.  Chauncey Cruel, MD   01/17/2014 3:12 PM  Addendum (01/21/2014): The patient's final pathology report confirms her tumor is estrogen receptor negative, progesterone receptor "positive" at 3%, with weak staining intensity. This is essentially a triple negative tumor and will be treated as such. Accordingly Oncotype will not be sent and the patient will need a port at the time of her lumpectomy.

## 2014-01-18 ENCOUNTER — Telehealth: Payer: Self-pay | Admitting: *Deleted

## 2014-01-18 ENCOUNTER — Encounter: Payer: Self-pay | Admitting: Specialist

## 2014-01-18 NOTE — Telephone Encounter (Signed)
Spoke to pt about repeat ER/PR results. Informed pt ER- PR 2%. Discussed the need for chemotherapy per her discussion with Dr. Jana Hakim. Informed we will send her to chemo class and get an echo. Reiterated she will have surgery first then f/u with Dr. Jana Hakim to further discuss the need for chemotherapy, s/e, etc. Received verbal understanding. Pt denies questions or concerns regarding dx or treatment care plan. Encourage pt to call with needs. Contact information given.

## 2014-01-19 ENCOUNTER — Other Ambulatory Visit: Payer: Self-pay | Admitting: *Deleted

## 2014-01-19 DIAGNOSIS — C50512 Malignant neoplasm of lower-outer quadrant of left female breast: Secondary | ICD-10-CM

## 2014-01-22 ENCOUNTER — Telehealth: Payer: Self-pay | Admitting: Oncology

## 2014-01-22 ENCOUNTER — Other Ambulatory Visit: Payer: Self-pay | Admitting: *Deleted

## 2014-01-22 NOTE — Telephone Encounter (Signed)
per response from [facility] in [place] - no preauth needed for echo. called pt and s/w pt re appt for echo 8/12 @ 9am at [facility]. also confirmed appts for 8/12 ched and 8/28 GM. these appts were mailed earlier.

## 2014-01-22 NOTE — Telephone Encounter (Signed)
lmonvm for pt re appts for 8/12 and 8/28. scheduled mailed. echo to [facility] for [place]. appt for 8/28 scheduled at 1pm due to 230pm slot taken.

## 2014-01-23 ENCOUNTER — Other Ambulatory Visit (INDEPENDENT_AMBULATORY_CARE_PROVIDER_SITE_OTHER): Payer: Self-pay | Admitting: General Surgery

## 2014-01-23 DIAGNOSIS — C50912 Malignant neoplasm of unspecified site of left female breast: Secondary | ICD-10-CM

## 2014-01-24 NOTE — Progress Notes (Signed)
Radiation Oncology         ([phone]) [phone] ________________________________  Name: Monica [name] MRN: [id number]  Date: 01/17/2014  DOB: 1946-04-03  XL:KGMWN,UUVOZD ALEXANDER, MD  Hoxworth, Darene Lamer, MD     REFERRING PHYSICIAN: Excell Seltzer Darene Lamer, MD   DIAGNOSIS: The encounter diagnosis was Breast cancer of lower-outer quadrant of left female breast.   HISTORY OF PRESENT ILLNESS::Monica [name] is a 68 y.o. female who is seen for an initial consultation visit. The patient is seen today in multidisciplinary breast clinic for her new diagnosis of left-sided breast cancer. The patient was found to have 2 possible masses within the lateral aspect of the left breast on screening mammography. She therefore proceeded with diagnostic mammogram which showed 2 adjacent masses within the lower outer portion of the left breast. This was confirmed on ultrasound. 2 left axillary lymph nodes were also seen with some mild thickening of the cortex.  An ultrasound guided biopsy was performed. Both of the masses from the for clock position of the left breast returned positive for invasive ductal carcinoma. The biopsy of a left axillary lymph node was negative. Receptor studies are pending with respect to estrogen and progesterone receptor. HER-2/neu is negative.  The patient has proceeded with an MRI scan of the breasts bilaterally. She masses were seen within the lower outer quadrant of the left breast consistent with recent positive biopsies. No evidence of contralateral malignancy. Postbiopsy change was present within the left axilla. A single mildly prominent left axillary lymph node was present. No other suspicious findings with respect to lymph nodes was present.   PREVIOUS RADIATION THERAPY: No   PAST MEDICAL HISTORY:  has a past medical history of Allergy (codeine); Arthritis; and Breast cancer.     PAST SURGICAL HISTORY:No past surgical history on file.   FAMILY HISTORY: family history  includes Cancer in her brother, maternal aunt, paternal aunt, and sister.   SOCIAL HISTORY:  reports that she has never smoked. She does not have any smokeless tobacco history on file. She reports that she does not drink alcohol.   ALLERGIES: Codeine   MEDICATIONS:  Current Outpatient Prescriptions  Medication Sig Dispense Refill  . amLODipine (NORVASC) 5 MG tablet Take 5 mg by mouth daily.      . Cholecalciferol (VITAMIN D3) 2000 UNITS TABS Take 1 tablet by mouth daily.      [name] docusate sodium (COLACE) 100 MG capsule Take 100 mg by mouth 2 (two) times daily.      [name] estradiol (ESTRACE) 1 MG tablet Take 2 mg by mouth daily.      . Ginkgo Biloba 120 MG CAPS Take 1 capsule by mouth daily.      . meloxicam (MOBIC) 15 MG tablet Take 15 mg by mouth daily as needed for pain.      [name] UNABLE TO FIND Take 1 tablet by mouth daily. OMEGA XL      . valsartan-hydrochlorothiazide (DIOVAN-HCT) 320-25 MG per tablet Take 1 tablet by mouth daily.       No current facility-administered medications for this encounter.     REVIEW OF SYSTEMS:  A 15 point review of systems is documented in the electronic medical record. This was obtained by the nursing staff. However, I reviewed this with the patient to discuss relevant findings and make appropriate changes.  Pertinent items are noted in HPI.    PHYSICAL EXAM:  vitals were not taken for this visit.  ECOG = 1  0 - Asymptomatic (Fully  active, able to carry on all predisease activities without restriction)  1 - Symptomatic but completely ambulatory (Restricted in physically strenuous activity but ambulatory and able to carry out work of a light or sedentary nature. For example, light housework, office work)  2 - Symptomatic, <50% in bed during the day (Ambulatory and capable of all self care but unable to carry out any work activities. Up and about more than 50% of waking hours)  3 - Symptomatic, >50% in bed, but not bedbound (Capable of only limited  self-care, confined to bed or chair 50% or more of waking hours)  4 - Bedbound (Completely disabled. Cannot carry on any self-care. Totally confined to bed or chair)  5 - Death   Eustace Pen MM, Creech RH, Tormey DC, et al. [phone]). "Toxicity and response criteria of the [facility]". [place] Oncol. 5 (6): 649-55  General: Well-developed, in no acute distress HEENT: Normocephalic, atraumatic; oral cavity clear Neck: Supple without any lymphadenopathy Cardiovascular: Regular rate and rhythm Respiratory: Clear to auscultation bilaterally Breasts: Palpable mass is present within the lateral aspect of the left breast inferiorly. This measured approximately 4 cm. No left-sided lymphadenopathy is present. Negative breast exam on the right and no suspicious lymph nodes within the right axilla. GI: Soft, nontender, normal bowel sounds Extremities: No edema present Neuro: No focal deficits     LABORATORY DATA:  Lab Results  Component Value Date   WBC 5.2 01/17/2014   HGB 13.0 01/17/2014   HCT 39.1 01/17/2014   MCV 83.4 01/17/2014   PLT 286 01/17/2014   Lab Results  Component Value Date   NA 140 01/17/2014   K 3.3* 01/17/2014   CO2 27 01/17/2014   Lab Results  Component Value Date   ALT 14 01/17/2014   AST 20 01/17/2014   ALKPHOS 82 01/17/2014   BILITOT 0.26 01/17/2014      RADIOGRAPHY: Mr Breast Bilateral W Wo Contrast  01/16/2014   ADDENDUM REPORT: 01/16/2014 12:16  ADDENDUM: The enhancing masses in the left breast measure 1.4 x 1.5 x 1.6 cm (more anterior lesion) and 1.9 x 2.2 x 1.6 cm (more posterior lesion). Measured together there is approximately 4.0 cm of disease measured anterior to posterior, inclusive of both lesions.   Electronically Signed   By: Shon Hale M.D.   On: 01/16/2014 12:16   01/16/2014   CLINICAL DATA:  Recent left breast biopsy shows grade 3 invasive ductal carcinoma at 2 sites in the left breast 4 o'clock location, 16 and 20 cm from the nipple.  A left axillary lymph node was biopsied and was benign.  LABS:  BUN and creatinine were obtained on site at [facility] at  [address]  Results:  BUN 14 mg/dL,  Creatinine 0.9 mg/dL.  EXAM: BILATERAL BREAST MRI WITH AND WITHOUT CONTRAST  TECHNIQUE: Multiplanar, multisequence MR images of both breasts were obtained prior to and following the intravenous administration of 30m of MultiHance.  THREE-DIMENSIONAL MR IMAGE RENDERING ON INDEPENDENT WORKSTATION:  Three-dimensional MR images were rendered by post-processing of the original MR data on an independent workstation. The three-dimensional MR images were interpreted, and findings are reported in the following complete MRI report for this study. Three dimensional images were evaluated at the independent DynaCad workstation  COMPARISON:  [facility] and earlier  FINDINGS: Breast composition: c:  Heterogeneous fibroglandular tissue  Background parenchymal enhancement: Moderate  Right breast: A skin lesion in the the lower central portion of the right breast shows enhancement, consistent with a skin lesion previously marked mammographically. No suspicious parenchymal abnormality identified.  Left breast: Within the lower outer quadrant of the left breast there are 2 oval masses with irregular margins. Each of these demonstrates homogeneous internal enhancement and shows rapid wash-in and washout type enhancement kinetics. Each mass contains tissue marker clip artifact from recent core biopsies. No other suspicious enhancement identified within the left breast.  Lymph nodes: There is post biopsy change within the left axilla following recent ultrasound-guided core biopsy of a left axillary lymph node. There is a single mildly prominent left axillary lymph node. Other are nodes have normal morphology. No suspicious  internal mammary nodes are identified.  Ancillary findings:  None.  IMPRESSION: 1. Two masses within the lower outer quadrant of the left breast consistent with known malignancy based on recent core biopsies. 2. No evidence for contralateral malignancy.  RECOMMENDATION: Treatment plan is recommended.  BI-RADS CATEGORY  6: Known biopsy-proven malignancy.  Electronically Signed: By: Shon Hale M.D. On: 01/16/2014 11:58   Mm Digital Diagnostic Unilat L  01/10/2014   CLINICAL DATA:  Post left breast ultrasound-guided core biopsies.  EXAM: DIAGNOSTIC left breast MAMMOGRAM POST ULTRASOUND BIOPSY  COMPARISON:  Previous examinations.  FINDINGS: Mammographic images were obtained following ultrasound guided biopsy of left breast masses located at the 4 o'clock position 16 cm and 20 cm from the nipple. The ribbon shaped clip is in appropriate position associated with the mass located at the 4 o'clock position 16 cm from the nipple. The wing shaped clip is in appropriate position associated with the mass located at the 4 o'clock position 20 cm from the nipple.  IMPRESSION: Appropriate positioning of clips as discussed above following ultrasound-guided core biopsy.  Final Assessment: Post Procedure Mammograms for Marker Placement   Electronically Signed   By: Luberta Robertson M.D.   On: 01/10/2014 13:10   Mm Digital Diagnostic Unilat L  01/05/2014   CLINICAL DATA:  Possible left breast masses at recent screening mammography. The patient also reports that her physician felt a mass in the 5 o'clock position of the left breast following her screening mammogram.  EXAM: DIGITAL DIAGNOSTIC  LEFT MAMMOGRAM WITH CAD  ULTRASOUND LEFT BREAST  COMPARISON:  Previous examinations, including the screening mammogram performed at [facility] on 01/01/2014.  ACR Breast Density Category b: There are scattered areas of fibroglandular density.  FINDINGS: Spot compression, spot tangential and laterally exaggerated  craniocaudal views of the left breast were obtained. These demonstrate two adjacent oval, poorly defined masses deep in the lower outer portion of the left breast.  Mammographic images were processed with CAD.  On physical exam, there is an approximately 3 x 1.5 cm palpable mass in the 4 o'clock position of the left breast, 16-20 cm from the nipple. There are no palpable left axillary lymph nodes.  Ultrasound is performed, showing two adjacent rounded, irregularly marginated hypoechoic masses in the 4 o'clock position of the left breast. One is 20 cm from the nipple and measures 1.5 x 1.4 x 1.2 cm. The other is 16 cm from the nipple and measures 1.0 x 0.8 x 0.8 cm. There are also 2 left axillary lymph nodes with mild diffuse cortical thickening. One of these demonstrates loss of the normal fatty hilum.  IMPRESSION: Two left breast masses highly suspicious for malignancy and two left axillary  lymph nodes suspicious for metastatic adenopathy.  RECOMMENDATION: Ultrasound-guided core needle biopsies of the two left breast masses and one of the left axillary lymph nodes. These have been scheduled for 10 a.m. on 01/10/2014.  I have discussed the findings and recommendations with the patient. Results were also provided in writing at the conclusion of the visit. If applicable, a reminder letter will be sent to the patient regarding the next appointment.  BI-RADS CATEGORY  5: Highly suggestive of malignancy.   Electronically Signed   By: Enrique Sack M.D.   On: 01/05/2014 08:31   US Breast Ltd Uni Left Inc Axilla  01/05/2014   CLINICAL DATA:  Possible left breast masses at recent screening mammography. The patient also reports that her physician felt a mass in the 5 o'clock position of the left breast following her screening mammogram.  EXAM: DIGITAL DIAGNOSTIC  LEFT MAMMOGRAM WITH CAD  ULTRASOUND LEFT BREAST  COMPARISON:  Previous examinations, including the screening mammogram performed at [facility] on 01/01/2014.  ACR Breast Density Category b: There are scattered areas of fibroglandular density.  FINDINGS: Spot compression, spot tangential and laterally exaggerated craniocaudal views of the left breast were obtained. These demonstrate two adjacent oval, poorly defined masses deep in the lower outer portion of the left breast.  Mammographic images were processed with CAD.  On physical exam, there is an approximately 3 x 1.5 cm palpable mass in the 4 o'clock position of the left breast, 16-20 cm from the nipple. There are no palpable left axillary lymph nodes.  Ultrasound is performed, showing two adjacent rounded, irregularly marginated hypoechoic masses in the 4 o'clock position of the left breast. One is 20 cm from the nipple and measures 1.5 x 1.4 x 1.2 cm. The other is 16 cm from the nipple and measures 1.0 x 0.8 x 0.8 cm. There are also 2 left axillary lymph nodes with mild diffuse cortical thickening. One of these demonstrates loss of the normal fatty hilum.  IMPRESSION: Two left breast masses highly suspicious for malignancy and two left axillary lymph nodes suspicious for metastatic adenopathy.  RECOMMENDATION: Ultrasound-guided core needle biopsies of the two left breast masses and one of the left axillary lymph nodes. These have been scheduled for 10 a.m. on 01/10/2014.  I have discussed the findings and recommendations with the patient. Results were also provided in writing at the conclusion of the visit. If applicable, a reminder letter will be sent to the patient regarding the next appointment.  BI-RADS CATEGORY  5: Highly suggestive of malignancy.   Electronically Signed   By: Enrique Sack M.D.   On: 01/05/2014 08:31   [place] Lt Breast Bx W Loc Dev 1st Lesion Img Bx Spec US Guide  01/16/2014   ADDENDUM REPORT: 01/15/2014 08:08  ADDENDUM: Pathology revealed grade 3 invasive ductal carcinoma in the left breast 4:00, 16 cm from the nipple and 20 cm from the nipple masses. The left axillary  lymph node that was biopsied was benign. This was found to be concordant by Dr. Curlene Dolphin. Pathology was discussed with the patient by telephone and her questions were answered. Post biopsy instructions and care were reviewed. She reported doing well after the biopsies with tenderness at the sites. Appointments have been made for a bilateral breast MRI on January 16, 2014 and [facility] on January 17, 2014. She was encouraged to come to The [place] [facility] for educational materials. My number was  provided for additional concerns and questions.  Pathology results reported by Susa Raring RN, BSN on January 15, 2014.   Electronically Signed   By: Luberta Robertson M.D.   On: 01/15/2014 08:08   01/16/2014   CLINICAL DATA:  Left breast masses located at the 4 o'clock position 16 and 20 cm from the nipple and possible left axillary adenopathy. Ultrasound-guided core biopsy recommended.  EXAM: ULTRASOUND GUIDED left BREAST CORE NEEDLE BIOPSY  COMPARISON:  Previous exams.  FINDINGS: I met with the patient and we discussed the procedure of ultrasound-guided biopsy, including benefits and alternatives. We discussed the high likelihood of a successful procedure. We discussed the risks of the procedure, including infection, bleeding, tissue injury, clip migration, and inadequate sampling. Informed written consent was given. The usual time-out protocol was performed immediately prior to the procedure.  Using sterile technique and 2% Lidocaine as local anesthetic, under direct ultrasound visualization, a 14 gauge spring-loaded device was used to perform biopsy of the mass located within the left breast at 4 o'clock position 16 cm from the nipple using a lateral approach. At the conclusion of the procedure a ribbon shaped tissue marker clip was deployed into the biopsy cavity. Follow up 2 view mammogram was performed and dictated separately.  IMPRESSION: Ultrasound guided biopsy  of the left breast mass located at 4 o'clock position 16 cm from the nipple as discussed above. No apparent complications.  Electronically Signed: By: Luberta Robertson M.D. On: 01/10/2014 13:04   [place] Lt Breast Bx W Loc Dev Ea Add Lesion Img Bx Spec US Guide  01/16/2014   ADDENDUM REPORT: 01/15/2014 08:09  ADDENDUM: Pathology revealed grade 3 invasive ductal carcinoma in the left breast 4:00, 16 cm from the nipple and 20 cm from the nipple masses. The left axillary lymph node that was biopsied was benign. This was found to be concordant by Dr. Curlene Dolphin. Pathology was discussed with the patient by telephone and her questions were answered. Post biopsy instructions and care were reviewed. She reported doing well after the biopsies with tenderness at the sites. Appointments have been made for a bilateral breast MRI on January 16, 2014 and [facility] on January 17, 2014. She was encouraged to come to The [place] for educational materials. My number was provided for additional concerns and questions.  Pathology results reported by Susa Raring RN, BSN on January 15, 2014.   Electronically Signed   By: Luberta Robertson M.D.   On: 01/15/2014 08:09   01/16/2014   CLINICAL DATA:  Left breast masses located at the 4 o'clock position 16 and 20 cm from the nipple and possible left axillary adenopathy. Ultrasound-guided core biopsy recommended.  EXAM: ULTRASOUND GUIDED left BREAST CORE NEEDLE BIOPSY  COMPARISON:  Previous exams.  FINDINGS: I met with the patient and we discussed the procedure of ultrasound-guided biopsy, including benefits and alternatives. We discussed the high likelihood of a successful procedure. We discussed the risks of the procedure, including infection, bleeding, tissue injury, clip migration, and inadequate sampling. Informed written consent was given. The usual time-out protocol was performed immediately prior to the procedure.  Using sterile  technique and 2% Lidocaine as local anesthetic, under direct ultrasound visualization, a 14 gauge spring-loaded device was used to perform biopsy of the mass located within the left breast at the 4 o'clock position 20 cm from the nipple using a lateral approach. At the conclusion of the procedure a wing shaped tissue marker  clip was deployed into the biopsy cavity. Follow up 2 view mammogram was performed and dictated separately.  IMPRESSION: Ultrasound guided biopsy of the left breast mass located at the 4 o'clock position 20 cm from the nipple as discussed above. No apparent complications.  Electronically Signed: By: Luberta Robertson M.D. On: 01/10/2014 13:06   [place] Lt Breast Bx W Loc Dev Ea Add Lesion Img Bx Spec US Guide  01/16/2014   ADDENDUM REPORT: 01/15/2014 08:12  ADDENDUM: Pathology revealed grade 3 invasive ductal carcinoma in the left breast 4:00, 16 cm from the nipple and 20 cm from the nipple masses. The left axillary lymph node that was biopsied was benign. This was found to be concordant by Dr. Curlene Dolphin. Pathology was discussed with the patient by telephone and her questions were answered. Post biopsy instructions and care were reviewed. She reported doing well after the biopsies with tenderness at the sites. Appointments have been made for a bilateral breast MRI on January 16, 2014 and [facility] on January 17, 2014. She was encouraged to come to The [place] for educational materials. My number was provided for additional concerns and questions.  Pathology results reported by Susa Raring RN, BSN on January 15, 2014.   Electronically Signed   By: Luberta Robertson M.D.   On: 01/15/2014 08:12   01/16/2014   CLINICAL DATA:  Left breast masses located at the 4 o'clock position 16 and 20 cm from the nipple and possible left axillary adenopathy. Ultrasound-guided core biopsy recommended.  EXAM: ULTRASOUND GUIDED LEFT AXILLARY LYMPH NODE BIOPSY   COMPARISON:  Previous exams.  FINDINGS: I met with the patient and we discussed the procedure of ultrasound-guided biopsy, including benefits and alternatives. We discussed the high likelihood of a successful procedure. We discussed the risks of the procedure, including infection, bleeding, tissue injury, clip migration, and inadequate sampling. Informed written consent was given. The usual time-out protocol was performed immediately prior to the procedure.  Using sterile technique and 2% Lidocaine as local anesthetic, under direct ultrasound visualization, a 14 gauge spring-loaded device was used to perform biopsy of the prominent level 1 left axillary lymph node with cortical thickening using a lateral approach. No clip was deployed within the lymph node.  IMPRESSION: Ultrasound guided biopsy of the prominent left axillary lymph node (level 1) as discussed above. No apparent complications.  Electronically Signed: By: Luberta Robertson M.D. On: 01/10/2014 13:08       IMPRESSION: The patient has a new diagnosis of invasive ductal carcinoma of the left breast with 2 adjacent masses within the left lateral breast. Clinically this appears to represent a T2, N0, M0 tumor. The patient is seen today in multidisciplinary breast clinic. She is felt to be a good candidate for breast conservation treatment and Dr. Tobie Poet work as discussed proceeding with a lumpectomy and sentinel lymph node biopsy. The patient also has been seen by medical oncology. An Oncotype test has been recommended.  I discussed with the patient the recommendation to proceed with adjuvant radiation treatment at the appropriate time as part of her overall breast conservation treatment strategy. I discussed with the patient the rationale for treatment to improve local/regional control. We also discussed the possible side effects and risks of treatment as well. All of her questions were answered.   PLAN: The patient will proceed initially with a sentinel  lymph node biopsy/lumpectomy. In appetite test will be performed and I look forward to seeing  the patient postoperatively to further discuss and coordinate an anticipated course of adjuvant radiation treatment.    I spent 60 minutes face to face with the patient and more than 50% of that time was spent in counseling and/or coordination of care.    ________________________________   Jodelle Gross, MD, PhD   **Disclaimer: This note was dictated with voice recognition software. Similar sounding words can inadvertently be transcribed and this note may contain transcription errors which may not have been corrected upon publication of note.**

## 2014-01-31 ENCOUNTER — Other Ambulatory Visit: Payer: Medicare (Managed Care)

## 2014-01-31 ENCOUNTER — Encounter: Payer: Self-pay | Admitting: General Practice

## 2014-01-31 ENCOUNTER — Encounter: Payer: Self-pay | Admitting: *Deleted

## 2014-01-31 ENCOUNTER — Ambulatory Visit (HOSPITAL_COMMUNITY)
Admission: RE | Admit: 2014-01-31 | Discharge: 2014-01-31 | Disposition: A | Discharge status: Home / Self Care | Payer: MEDICARE | Source: Ambulatory Visit | Attending: Oncology | Admitting: Oncology

## 2014-01-31 DIAGNOSIS — C50519 Malignant neoplasm of lower-outer quadrant of unspecified female breast: Secondary | ICD-10-CM | POA: Diagnosis not present

## 2014-01-31 DIAGNOSIS — I369 Nonrheumatic tricuspid valve disorder, unspecified: Secondary | ICD-10-CM

## 2014-01-31 DIAGNOSIS — C50512 Malignant neoplasm of lower-outer quadrant of left female breast: Secondary | ICD-10-CM

## 2014-01-31 NOTE — Progress Notes (Signed)
  Echocardiogram 2D Echocardiogram has been performed.  Monica [name] 01/31/2014, 10:03 AM

## 2014-01-31 NOTE — Progress Notes (Signed)
Followed up with Monica [name] and her goddaughter in Chemo Class after meeting in Breast Clinic.  She was in good spirits and appreciative of pastoral presence and reminder of chaplain availability for ongoing spiritual/emotional support.  [place], [place]

## 2014-02-01 ENCOUNTER — Telehealth: Payer: Self-pay | Admitting: *Deleted

## 2014-02-01 NOTE — Telephone Encounter (Signed)
Pt called asking about being able to get shots in her knees q 3 wks x 3, stating that she has surgery for lumpectomy 8/24 & then see Dr Jana Hakim 02/16/14.  She also plans to go out of town in November & wants to make sure she can do this.  Encouraged her to discuss all of this with Dr. Jana Hakim when she sees him.

## 2014-02-01 NOTE — Telephone Encounter (Signed)
Received voice message from patient stating she had a question about a surgery on August 28 th. This RN has called and left two messages for patient to return my phone call. Instructed patient to call [phone].

## 2014-02-06 ENCOUNTER — Encounter (HOSPITAL_BASED_OUTPATIENT_CLINIC_OR_DEPARTMENT_OTHER): Payer: Self-pay | Admitting: *Deleted

## 2014-02-06 NOTE — Progress Notes (Signed)
To come in for ekg- 

## 2014-02-06 NOTE — Progress Notes (Signed)
02/06/14 1337  OBSTRUCTIVE SLEEP APNEA  Have you ever been diagnosed with sleep apnea through a sleep study? No  Do you snore loudly (loud enough to be heard through closed doors)?  1  Do you often feel tired, fatigued, or sleepy during the daytime? 0  Has anyone observed you stop breathing during your sleep? 0  Do you have, or are you being treated for high blood pressure? 1  BMI more than 35 kg/m2? 1  Age over 67 years old? 1  Neck circumference greater than 40 cm/16 inches? 1  Gender: 0  Obstructive Sleep Apnea Score 5  Score 4 or greater  Results sent to PCP

## 2014-02-07 ENCOUNTER — Encounter (HOSPITAL_BASED_OUTPATIENT_CLINIC_OR_DEPARTMENT_OTHER)
Admission: RE | Admit: 2014-02-07 | Discharge: 2014-02-07 | Disposition: A | Discharge status: Home / Self Care | Payer: MEDICARE | Source: Ambulatory Visit | Attending: General Surgery | Admitting: General Surgery

## 2014-02-07 DIAGNOSIS — C50519 Malignant neoplasm of lower-outer quadrant of unspecified female breast: Secondary | ICD-10-CM | POA: Insufficient documentation

## 2014-02-07 DIAGNOSIS — Z01818 Encounter for other preprocedural examination: Secondary | ICD-10-CM | POA: Diagnosis present

## 2014-02-12 ENCOUNTER — Ambulatory Visit (HOSPITAL_COMMUNITY): Payer: MEDICARE

## 2014-02-12 ENCOUNTER — Ambulatory Visit (HOSPITAL_BASED_OUTPATIENT_CLINIC_OR_DEPARTMENT_OTHER)
Admission: RE | Admit: 2014-02-12 | Discharge: 2014-02-12 | Disposition: A | Discharge status: Home / Self Care | Payer: MEDICARE | Source: Ambulatory Visit | Attending: General Surgery | Admitting: General Surgery

## 2014-02-12 ENCOUNTER — Encounter (HOSPITAL_BASED_OUTPATIENT_CLINIC_OR_DEPARTMENT_OTHER): Payer: MEDICARE | Admitting: Anesthesiology

## 2014-02-12 ENCOUNTER — Ambulatory Visit
Admission: RE | Admit: 2014-02-12 | Discharge: 2014-02-12 | Disposition: A | Discharge status: Home / Self Care | Payer: Medicare (Managed Care) | Source: Ambulatory Visit | Attending: General Surgery | Admitting: General Surgery

## 2014-02-12 ENCOUNTER — Encounter (HOSPITAL_BASED_OUTPATIENT_CLINIC_OR_DEPARTMENT_OTHER)
Admission: RE | Disposition: A | Discharge status: Home / Self Care | Payer: Self-pay | Source: Ambulatory Visit | Attending: General Surgery

## 2014-02-12 ENCOUNTER — Other Ambulatory Visit (INDEPENDENT_AMBULATORY_CARE_PROVIDER_SITE_OTHER): Payer: Self-pay | Admitting: General Surgery

## 2014-02-12 ENCOUNTER — Encounter (HOSPITAL_COMMUNITY)
Admission: RE | Admit: 2014-02-12 | Discharge: 2014-02-12 | Disposition: A | Discharge status: Home / Self Care | Payer: MEDICARE | Source: Ambulatory Visit | Attending: General Surgery | Admitting: General Surgery

## 2014-02-12 ENCOUNTER — Ambulatory Visit (HOSPITAL_BASED_OUTPATIENT_CLINIC_OR_DEPARTMENT_OTHER): Payer: MEDICARE | Admitting: Anesthesiology

## 2014-02-12 ENCOUNTER — Encounter (HOSPITAL_BASED_OUTPATIENT_CLINIC_OR_DEPARTMENT_OTHER): Payer: Self-pay | Admitting: Anesthesiology

## 2014-02-12 DIAGNOSIS — Z79899 Other long term (current) drug therapy: Secondary | ICD-10-CM | POA: Insufficient documentation

## 2014-02-12 DIAGNOSIS — Z171 Estrogen receptor negative status [ER-]: Secondary | ICD-10-CM | POA: Diagnosis not present

## 2014-02-12 DIAGNOSIS — C50519 Malignant neoplasm of lower-outer quadrant of unspecified female breast: Secondary | ICD-10-CM | POA: Insufficient documentation

## 2014-02-12 DIAGNOSIS — C50912 Malignant neoplasm of unspecified site of left female breast: Secondary | ICD-10-CM

## 2014-02-12 DIAGNOSIS — K4090 Unilateral inguinal hernia, without obstruction or gangrene, not specified as recurrent: Secondary | ICD-10-CM

## 2014-02-12 DIAGNOSIS — C50919 Malignant neoplasm of unspecified site of unspecified female breast: Secondary | ICD-10-CM

## 2014-02-12 DIAGNOSIS — C50512 Malignant neoplasm of lower-outer quadrant of left female breast: Secondary | ICD-10-CM

## 2014-02-12 DIAGNOSIS — I10 Essential (primary) hypertension: Secondary | ICD-10-CM | POA: Insufficient documentation

## 2014-02-12 DIAGNOSIS — Z8679 Personal history of other diseases of the circulatory system: Secondary | ICD-10-CM

## 2014-02-12 HISTORY — DX: Personal history of other diseases of the circulatory system: Z86.79

## 2014-02-12 HISTORY — PX: BREAST LUMPECTOMY: SHX2

## 2014-02-12 HISTORY — PX: PORTACATH PLACEMENT: SHX2246

## 2014-02-12 HISTORY — PX: BREAST LUMPECTOMY WITH NEEDLE LOCALIZATION AND AXILLARY SENTINEL LYMPH NODE BX: SHX5760

## 2014-02-12 HISTORY — DX: Essential (primary) hypertension: I10

## 2014-02-12 SURGERY — BREAST LUMPECTOMY WITH NEEDLE LOCALIZATION AND AXILLARY SENTINEL LYMPH NODE BX
Anesthesia: Regional | Site: Chest | Laterality: Right

## 2014-02-12 MED ORDER — CEFAZOLIN SODIUM-DEXTROSE 2-3 GM-% IV SOLR
2.0000 g | INTRAVENOUS | Status: AC
  Administered 2014-02-12: 2 g via INTRAVENOUS

## 2014-02-12 MED ORDER — HYDROMORPHONE HCL PF 1 MG/ML IJ SOLN
0.2500 mg | INTRAMUSCULAR | Status: DC | PRN
  Administered 2014-02-12 (×2): 0.5 mg via INTRAVENOUS

## 2014-02-12 MED ORDER — DEXAMETHASONE SODIUM PHOSPHATE 4 MG/ML IJ SOLN
INTRAMUSCULAR | Status: DC | PRN
  Administered 2014-02-12: 10 mg via INTRAVENOUS

## 2014-02-12 MED ORDER — LIDOCAINE HCL (CARDIAC) 20 MG/ML IV SOLN
INTRAVENOUS | Status: DC | PRN
  Administered 2014-02-12: 60 mg via INTRAVENOUS

## 2014-02-12 MED ORDER — CHLORHEXIDINE GLUCONATE 4 % EX LIQD: 1.0000 "application " | Freq: Once | CUTANEOUS | Status: DC

## 2014-02-12 MED ORDER — FENTANYL CITRATE 0.05 MG/ML IJ SOLN
INTRAMUSCULAR | Status: DC | PRN
  Administered 2014-02-12 (×3): 25 ug via INTRAVENOUS

## 2014-02-12 MED ORDER — OXYCODONE HCL 5 MG PO TABS: 5.0000 mg | ORAL_TABLET | Freq: Once | ORAL | Status: DC | PRN

## 2014-02-12 MED ORDER — MIDAZOLAM HCL 2 MG/2ML IJ SOLN
INTRAMUSCULAR | Status: AC
  Filled 2014-02-12: qty 2

## 2014-02-12 MED ORDER — LACTATED RINGERS IV SOLN
INTRAVENOUS | Status: DC
  Administered 2014-02-12 (×2): via INTRAVENOUS

## 2014-02-12 MED ORDER — PROMETHAZINE HCL 25 MG/ML IJ SOLN
INTRAMUSCULAR | Status: AC
  Filled 2014-02-12: qty 1

## 2014-02-12 MED ORDER — MIDAZOLAM HCL 2 MG/2ML IJ SOLN
1.0000 mg | INTRAMUSCULAR | Status: DC | PRN
  Administered 2014-02-12 (×2): 1 mg via INTRAVENOUS

## 2014-02-12 MED ORDER — ROPIVACAINE HCL 5 MG/ML IJ SOLN
INTRAMUSCULAR | Status: DC | PRN
  Administered 2014-02-12: 35 mL

## 2014-02-12 MED ORDER — PROMETHAZINE HCL 25 MG/ML IJ SOLN
6.2500 mg | Freq: Once | INTRAMUSCULAR | Status: AC
  Administered 2014-02-12: 6.25 mg via INTRAVENOUS

## 2014-02-12 MED ORDER — FENTANYL CITRATE 0.05 MG/ML IJ SOLN
INTRAMUSCULAR | Status: AC
  Filled 2014-02-12: qty 2

## 2014-02-12 MED ORDER — HEPARIN SOD (PORK) LOCK FLUSH 100 UNIT/ML IV SOLN
INTRAVENOUS | Status: DC | PRN
  Administered 2014-02-12: 500 [IU] via INTRAVENOUS

## 2014-02-12 MED ORDER — SCOPOLAMINE 1 MG/3DAYS TD PT72
1.0000 | MEDICATED_PATCH | Freq: Once | TRANSDERMAL | Status: DC
  Administered 2014-02-12: 1.5 mg via TRANSDERMAL

## 2014-02-12 MED ORDER — HYDROMORPHONE HCL PF 1 MG/ML IJ SOLN
INTRAMUSCULAR | Status: AC
  Filled 2014-02-12: qty 1

## 2014-02-12 MED ORDER — PROPOFOL 10 MG/ML IV BOLUS
INTRAVENOUS | Status: DC | PRN
  Administered 2014-02-12: 200 mg via INTRAVENOUS

## 2014-02-12 MED ORDER — FENTANYL CITRATE 0.05 MG/ML IJ SOLN
50.0000 ug | INTRAMUSCULAR | Status: DC | PRN
  Administered 2014-02-12 (×2): 50 ug via INTRAVENOUS

## 2014-02-12 MED ORDER — TECHNETIUM TC 99M SULFUR COLLOID FILTERED
1.0000 | Freq: Once | INTRAVENOUS | Status: AC | PRN
  Administered 2014-02-12: 1 via INTRADERMAL

## 2014-02-12 MED ORDER — CEFAZOLIN SODIUM-DEXTROSE 2-3 GM-% IV SOLR: 2.0000 g | INTRAVENOUS | Status: DC

## 2014-02-12 MED ORDER — FENTANYL CITRATE 0.05 MG/ML IJ SOLN
INTRAMUSCULAR | Status: AC
  Filled 2014-02-12: qty 4

## 2014-02-12 MED ORDER — CEFAZOLIN SODIUM-DEXTROSE 2-3 GM-% IV SOLR
INTRAVENOUS | Status: AC
  Filled 2014-02-12: qty 50

## 2014-02-12 MED ORDER — HEPARIN (PORCINE) IN NACL 2-0.9 UNIT/ML-% IJ SOLN
INTRAMUSCULAR | Status: DC | PRN
  Administered 2014-02-12: 1 via INTRAVENOUS

## 2014-02-12 MED ORDER — BUPIVACAINE-EPINEPHRINE 0.5% -1:200000 IJ SOLN
INTRAMUSCULAR | Status: DC | PRN
  Administered 2014-02-12: 16 mL

## 2014-02-12 MED ORDER — ONDANSETRON HCL 4 MG/2ML IJ SOLN
INTRAMUSCULAR | Status: DC | PRN
  Administered 2014-02-12: 4 mg via INTRAVENOUS

## 2014-02-12 MED ORDER — EPHEDRINE SULFATE 50 MG/ML IJ SOLN
INTRAMUSCULAR | Status: DC | PRN
  Administered 2014-02-12: 15 mg via INTRAVENOUS

## 2014-02-12 MED ORDER — SODIUM CHLORIDE 0.9 % IJ SOLN
INTRAMUSCULAR | Status: DC | PRN
  Administered 2014-02-12: 13:00:00

## 2014-02-12 MED ORDER — SCOPOLAMINE 1 MG/3DAYS TD PT72
MEDICATED_PATCH | TRANSDERMAL | Status: AC
  Filled 2014-02-12: qty 1

## 2014-02-12 MED ORDER — OXYCODONE HCL 5 MG/5ML PO SOLN: 5.0000 mg | Freq: Once | ORAL | Status: DC | PRN

## 2014-02-12 MED ORDER — 0.9 % SODIUM CHLORIDE (POUR BTL) OPTIME
TOPICAL | Status: DC | PRN
  Administered 2014-02-12: 500 mL

## 2014-02-12 MED ORDER — OXYCODONE HCL 5 MG PO TABS: 5.0000 mg | ORAL_TABLET | ORAL | Status: DC | PRN

## 2014-02-12 SURGICAL SUPPLY — 73 items
APPLIER CLIP 11 MED OPEN (CLIP)
BAG DECANTER FOR FLEXI CONT (MISCELLANEOUS) ×3 IMPLANT
BANDAGE ADH SHEER 1  50/CT (GAUZE/BANDAGES/DRESSINGS) ×3 IMPLANT
BLADE SURG 10 STRL SS (BLADE) ×3 IMPLANT
BLADE SURG 15 STRL LF DISP TIS (BLADE) ×4 IMPLANT
BLADE SURG 15 STRL SS (BLADE) ×2
CANISTER SUCT 1200ML W/VALVE (MISCELLANEOUS) ×3 IMPLANT
CHLORAPREP W/TINT 26ML (MISCELLANEOUS) ×6 IMPLANT
CLIP APPLIE 11 MED OPEN (CLIP) IMPLANT
CLIP TI WIDE RED SMALL 6 (CLIP) ×3 IMPLANT
COVER MAYO STAND STRL (DRAPES) ×6 IMPLANT
COVER PROBE W GEL 5X96 (DRAPES) ×3 IMPLANT
COVER TABLE BACK 60X90 (DRAPES) ×6 IMPLANT
DECANTER SPIKE VIAL GLASS SM (MISCELLANEOUS) IMPLANT
DERMABOND ADVANCED (GAUZE/BANDAGES/DRESSINGS) ×2
DERMABOND ADVANCED .7 DNX12 (GAUZE/BANDAGES/DRESSINGS) ×4 IMPLANT
DEVICE DUBIN W/COMP PLATE 8390 (MISCELLANEOUS) ×3 IMPLANT
DRAIN CHANNEL 19F RND (DRAIN) IMPLANT
DRAIN HEMOVAC 1/8 X 5 (WOUND CARE) IMPLANT
DRAPE C-ARM 42X72 X-RAY (DRAPES) ×3 IMPLANT
DRAPE LAPAROSCOPIC ABDOMINAL (DRAPES) ×3 IMPLANT
DRAPE LAPAROTOMY T 102X78X121 (DRAPES) ×3 IMPLANT
DRAPE UTILITY XL STRL (DRAPES) ×6 IMPLANT
ELECT COATED BLADE 2.86 ST (ELECTRODE) ×6 IMPLANT
ELECT REM PT RETURN 9FT ADLT (ELECTROSURGICAL) ×3
ELECTRODE REM PT RTRN 9FT ADLT (ELECTROSURGICAL) ×2 IMPLANT
EVACUATOR SILICONE 100CC (DRAIN) IMPLANT
GLOVE BIO SURGEON STRL SZ 6.5 (GLOVE) ×9 IMPLANT
GLOVE BIOGEL PI IND STRL 7.0 (GLOVE) ×2 IMPLANT
GLOVE BIOGEL PI IND STRL 8 (GLOVE) ×4 IMPLANT
GLOVE BIOGEL PI INDICATOR 7.0 (GLOVE) ×1
GLOVE BIOGEL PI INDICATOR 8 (GLOVE) ×2
GLOVE EXAM NITRILE MD LF STRL (GLOVE) ×3 IMPLANT
GLOVE SS BIOGEL STRL SZ 7.5 (GLOVE) ×6 IMPLANT
GLOVE SUPERSENSE BIOGEL SZ 7.5 (GLOVE) ×3
GOWN STRL REUS W/ TWL LRG LVL3 (GOWN DISPOSABLE) ×6 IMPLANT
GOWN STRL REUS W/ TWL XL LVL3 (GOWN DISPOSABLE) ×4 IMPLANT
GOWN STRL REUS W/TWL LRG LVL3 (GOWN DISPOSABLE) ×3
GOWN STRL REUS W/TWL XL LVL3 (GOWN DISPOSABLE) ×2
IV CATH PLACEMENT UNIT 16 GA (IV SOLUTION) IMPLANT
IV KIT MINILOC 20X1 SAFETY (NEEDLE) IMPLANT
KIT BARDPORT ISP (Port) IMPLANT
KIT MARKER MARGIN INK (KITS) ×3 IMPLANT
KIT PORT POWER 8FR ISP CVUE (Catheter) ×3 IMPLANT
NDL SAFETY ECLIPSE 18X1.5 (NEEDLE) ×2 IMPLANT
NEEDLE HYPO 18GX1.5 SHARP (NEEDLE) ×1
NEEDLE HYPO 22GX1.5 SAFETY (NEEDLE) ×6 IMPLANT
NEEDLE HYPO 25X1 1.5 SAFETY (NEEDLE) ×3 IMPLANT
NS IRRIG 1000ML POUR BTL (IV SOLUTION) ×3 IMPLANT
PACK BASIN DAY SURGERY FS (CUSTOM PROCEDURE TRAY) ×3 IMPLANT
PAD ALCOHOL SWAB (MISCELLANEOUS) ×6 IMPLANT
PENCIL BUTTON HOLSTER BLD 10FT (ELECTRODE) ×6 IMPLANT
PIN SAFETY STERILE (MISCELLANEOUS) IMPLANT
SET SHEATH INTRODUCER 10FR (MISCELLANEOUS) IMPLANT
SHEATH COOK PEEL AWAY SET 9F (SHEATH) IMPLANT
SLEEVE SCD COMPRESS KNEE MED (MISCELLANEOUS) ×3 IMPLANT
SPONGE LAP 18X18 X RAY DECT (DISPOSABLE) ×3 IMPLANT
SPONGE LAP 4X18 X RAY DECT (DISPOSABLE) ×3 IMPLANT
SUT ETHILON 3 0 FSL (SUTURE) IMPLANT
SUT MON AB 4-0 PC3 18 (SUTURE) ×9 IMPLANT
SUT MON AB 5-0 PS2 18 (SUTURE) IMPLANT
SUT PROLENE 2 0 CT2 30 (SUTURE) ×3 IMPLANT
SUT SILK 3 0 SH 30 (SUTURE) IMPLANT
SUT SILK 4 0 TIES 17X18 (SUTURE) IMPLANT
SUT VIC AB 3-0 SH 27 (SUTURE)
SUT VIC AB 3-0 SH 27X BRD (SUTURE) IMPLANT
SUT VICRYL 3-0 CR8 SH (SUTURE) ×3 IMPLANT
SYR 5ML LUER SLIP (SYRINGE) ×6 IMPLANT
SYR CONTROL 10ML LL (SYRINGE) ×9 IMPLANT
TOWEL OR 17X24 6PK STRL BLUE (TOWEL DISPOSABLE) ×6 IMPLANT
TOWEL OR NON WOVEN STRL DISP B (DISPOSABLE) ×3 IMPLANT
TUBE CONNECTING 20X1/4 (TUBING) ×3 IMPLANT
YANKAUER SUCT BULB TIP NO VENT (SUCTIONS) ×3 IMPLANT

## 2014-02-12 NOTE — H&P (View-Only) (Signed)
Chief Complaint: New diagnosis of breast cancer  History:    Monica [name] is a 67 y.o. postmenopausal female referred by Dr. F. Randoph Jackson  for evaluation of recently diagnosed carcinoma of the left breast. She recently presented for a screening mamogram revealing 2 apparent masses in the lateral aspect of the left breast.  Subsequent imaging included diagnostic mamogram showing 2 adjacent oval poorly defined masses deep in the lower outer portion of the left breast. and ultrasound showing 2 irregular hypoechoic masses at the 4:00 position of the left breast one 20 cm from the nipple measuring 1.5 cm and another 16 cm from the nipple measuring 1 cm. 2 left axillary lymph nodes were seen with mild cortical thickening..   A ultrasound guided biopsy was performed on 01/12/2014 of the 2 breast masses and one abnormal axillary lymph node with pathology revealing infiltrating ductal carcinoma of the breast In both masses but axillary lymph node biopsy was negative for malignancy. Subsequent bilateral breast MRI was performed showing 2 masses corresponding to the known malignancies and postbiopsy change in the axilla with a mildly prominent left axillary node. The 2 masses or adjacent and total area encompassed is approximately 4 cm.. She is seen now in Breast multidisciplinary clinic for initial treatment planning.  She has experienced No breast symptoms personally..  She does not have a personal history of any previous breast problems.  Findings at that time were the following:  Tumor size: 1.4 and 1.9 cm encompassing a total of 4 cm  Tumor grade: 3  Estrogen Receptor: negative Progesterone Receptor: negative  Her-2 neu: negative  Lymph node status: negative  Past Medical History  Diagnosis Date  . Allergy codeine    rapid heart beat, cold sweat  . Arthritis   . Breast cancer     No past surgical history on file.  Current Outpatient Prescriptions  Medication Sig Dispense Refill  .  amLODipine (NORVASC) 5 MG tablet Take 5 mg by mouth daily.      . Cholecalciferol (VITAMIN D3) 2000 UNITS TABS Take 1 tablet by mouth daily.      . docusate sodium (COLACE) 100 MG capsule Take 100 mg by mouth 2 (two) times daily.      . estradiol (ESTRACE) 1 MG tablet Take 2 mg by mouth daily.      . Ginkgo Biloba 120 MG CAPS Take 1 capsule by mouth daily.      . meloxicam (MOBIC) 15 MG tablet Take 15 mg by mouth daily as needed for pain.      . UNABLE TO FIND Take 1 tablet by mouth daily. OMEGA XL      . valsartan-hydrochlorothiazide (DIOVAN-HCT) 320-25 MG per tablet Take 1 tablet by mouth daily.       No current facility-administered medications for this visit.    Family History  Problem Relation Age of Onset  . Cancer Sister   . Cancer Brother   . Cancer Maternal Aunt   . Cancer Paternal Aunt     History   Social History  . Marital Status: Married    Spouse Name: N/A    Number of Children: N/A  . Years of Education: N/A   Social History Main Topics  . Smoking status: Never Smoker   . Smokeless tobacco: Not on file  . Alcohol Use: No  . Drug Use: Not on file  . Sexual Activity: Not on file   Other Topics Concern  . Not on file   Social   History Narrative  . No narrative on file     Review of Systems Constitutional: negative Respiratory: negative Cardiovascular: negative Gastrointestinal: negative     Objective:  There were no vitals taken for this visit.  General: Alert, Obese African American female, in no distress Skin: Warm and dry without rash or infection. HEENT: No palpable masses or thyromegaly. Sclera nonicteric. Pupils equal round and reactive. Oropharynx clear. Breasts: There is a palpable mass in the extreme lateral aspect of the left breast measuring in total approximately 4-5 cm anterior to posterior. May represent some postbiopsy change. Lymph nodes: No cervical, supraclavicular, or inguinal nodes palpable. Lungs: Breath sounds clear and equal  without increased work of breathing Cardiovascular: Regular rate and rhythm without murmur. No JVD or edema. Peripheral pulses intact. Abdomen: Nondistended. Soft and nontender. No masses palpable. No organomegaly. No palpable hernias. Extremities: No edema or joint swelling or deformity. No chronic venous stasis changes. Neurologic: Alert and fully oriented. Gait normal.   Laboratory data:  CBC:  Lab Results  Component Value Date   WBC 5.2 01/17/2014   RBC 4.69 01/17/2014   HGB 13.0 01/17/2014   HGB 13.9 11/03/2010   HCT 39.1 01/17/2014   HCT 41.0 11/03/2010   PLT 286 01/17/2014  ]  [facility] Labs:  Lab Results  Component Value Date   NA 140 01/17/2014   NA 141 11/03/2010   K 3.3* 01/17/2014   K 3.3* 11/03/2010   CO2 27 01/17/2014   BUN 12.7 01/17/2014   CREATININE 0.9 01/17/2014   CALCIUM 9.5 01/17/2014   PROT 7.4 01/17/2014   BILITOT 0.26 01/17/2014   ALKPHOS 82 01/17/2014   AST 20 01/17/2014   ALT 14 01/17/2014     Assessment  67 y.o. female with a new diagnosis of cancer of the the left breast lower outer quadrant.  Clinical IIA, estrogen receptor negative, progesterone receptor negative and Her2/Neu protein/oncogene negative. Estrogen receptor is going to be repeated although was felt to likely be negative. I discussed with the patient and family members present today initial surgical treatment options. We discussed options of breast conservation with lumpectomy or total mastectomy and sentinal lymph node biopsy/dissection. Options for reconstruction were discussed. Although she has 2 separate masses they are small and adjacent and could be encompassed with a reasonable sized partial mastectomy in an area where there is plenty of breast tissue. After discussion they have elected to proceed with left partial mastectomy.  We discussed the indications and nature of the procedure, and expected recovery, in detail. Surgical risks including anesthetic complications, cardiorespiratory complications,  bleeding, infection, wound healing complications, blood clots, lymphedema, local and distant recurrence and possible need for further surgery based on the final pathology was discussed and understood.  Chemotherapy, hormonal therapy and radiation therapy have been discussed. They have been provided with literature regarding the treatment of breast cancer.  All questions were answered. They understand and agree to proceed and we will go ahead with scheduling.  Plan left partial mastectomy Left axillary sentinel lymph node biopsy. Will ask for the 2 lesions to be bracketed. She may require chemotherapy if she is triple negative and we discussed potentially placing a Port-A-Cath at the time of her breast surgery.  Rosemond Lyttle T Aariyana Manz MD, FACS  01/17/2014, 3:34 PM 

## 2014-02-12 NOTE — Anesthesia Procedure Notes (Addendum)
Anesthesia Regional Block:  Pectoralis block  Pre-Anesthetic Checklist: ,, timeout performed, Correct Patient, Correct Site, Correct Laterality, Correct Procedure, Correct Position, site marked, Risks and benefits discussed, pre-op evaluation, post-op pain management  Laterality: Left  Prep: Maximum Sterile Barrier Precautions used and chloraprep       Needles:  Injection technique: Single-shot  Needle Type: Echogenic Stimulator Needle     Needle Length: 9cm 9 cm Needle Gauge: 21 and 21 G    Additional Needles:  Procedures: ultrasound guided (picture in chart) Pectoralis block Narrative:  Start time: 02/12/2014 11:57 AM End time: 02/12/2014 12:08 PM Injection made incrementally with aspirations every 5 mL. Anesthesiologist: Fitzgerald,MD  Additional Notes: 2% Lidocaine skin wheel.   Procedure Name: LMA Insertion Performed by: Terrance Mass Pre-anesthesia Checklist: Patient identified, Timeout performed, Emergency Drugs available, Suction available and Patient being monitored Patient Re-evaluated:Patient Re-evaluated prior to inductionOxygen Delivery Method: Circle system utilized Preoxygenation: Pre-oxygenation with 100% oxygen Intubation Type: IV induction Ventilation: Mask ventilation without difficulty LMA: LMA with gastric port inserted LMA Size: 4.0 Number of attempts: 1 Placement Confirmation: positive ETCO2 Tube secured with: Tape Dental Injury: Teeth and Oropharynx as per pre-operative assessment

## 2014-02-12 NOTE — Discharge Instructions (Signed)
[facility] °Office Phone Number [phone] ° °BREAST BIOPSY/ PARTIAL MASTECTOMY: POST OP INSTRUCTIONS ° °Always review your discharge instruction sheet given to you by the facility where your surgery was performed. ° °IF YOU HAVE DISABILITY OR FAMILY LEAVE FORMS, YOU MUST BRING THEM TO THE OFFICE FOR PROCESSING.  DO NOT GIVE THEM TO YOUR DOCTOR. ° °1. A prescription for pain medication may be given to you upon discharge.  Take your pain medication as prescribed, if needed.  If narcotic pain medicine is not needed, then you may take acetaminophen (Tylenol) or ibuprofen (Advil) as needed. °2. Take your usually prescribed medications unless otherwise directed °3. If you need a refill on your pain medication, please contact your pharmacy.  They will contact our office to request authorization.  Prescriptions will not be filled after 5pm or on week-ends. °4. You should eat very light the first 24 hours after surgery, such as soup, crackers, pudding, etc.  Resume your normal diet the day after surgery. °5. Most patients will experience some swelling and bruising in the breast.  Ice packs and a good support bra will help.  Swelling and bruising can take several days to resolve.  °6. It is common to experience some constipation if taking pain medication after surgery.  Increasing fluid intake and taking a stool softener will usually help or prevent this problem from occurring.  A mild laxative (Milk of Magnesia or Miralax) should be taken according to package directions if there are no bowel movements after 48 hours. °7. Unless discharge instructions indicate otherwise, you may remove your bandages 24-48 hours after surgery, and you may shower at that time.  You may have steri-strips (small skin tapes) in place directly over the incision.  These strips should be left on the skin for 7-10 days.  If your surgeon used skin glue on the incision, you may shower in 24 hours.  The glue will flake off over the  next 2-3 weeks.  Any sutures or staples will be removed at the office during your follow-up visit. °8. ACTIVITIES:  You may resume regular daily activities (gradually increasing) beginning the next day.  Wearing a good support bra or sports bra minimizes pain and swelling.  You may have sexual intercourse when it is comfortable. °a. You may drive when you no longer are taking prescription pain medication, you can comfortably wear a seatbelt, and you can safely maneuver your car and apply brakes. °b. RETURN TO WORK:  ______________________________________________________________________________________ °9. You should see your doctor in the office for a follow-up appointment approximately two weeks after your surgery.  Your doctor’s nurse will typically make your follow-up appointment when she calls you with your pathology report.  Expect your pathology report 2-3 business days after your surgery.  You may call to check if you do not hear from us after three days. °10. OTHER INSTRUCTIONS: _______________________________________________________________________________________________ _____________________________________________________________________________________________________________________________________ °_____________________________________________________________________________________________________________________________________ °_____________________________________________________________________________________________________________________________________ ° °WHEN TO CALL YOUR DOCTOR: °1. Fever over 101.0 °2. Nausea and/or vomiting. °3. Extreme swelling or bruising. °4. Continued bleeding from incision. °5. Increased pain, redness, or drainage from the incision. ° °The clinic staff is available to answer your questions during regular business hours.  Please don’t hesitate to call and ask to speak to one of the nurses for clinical concerns.  If you have a medical emergency, go to the nearest  emergency room or call 911.  A surgeon from [facility] is always on call at the hospital. ° °For further questions, please visit [email or web address]  ° ° ° ° °  PORT-A-CATH: POST OP INSTRUCTIONS ° °Always review your discharge instruction sheet given to you by the facility where your surgery was performed.  ° °1. A prescription for pain medication may be given to you upon discharge. Take your pain medication as prescribed, if needed. If narcotic pain medicine is not needed, then you make take acetaminophen (Tylenol) or ibuprofen (Advil) as needed.  °2. Take your usually prescribed medications unless otherwise directed. °3. If you need a refill on your pain medication, please contact our office. All narcotic pain medicine now requires a paper prescription.  Phoned in and fax refills are no longer allowed by law.  Prescriptions will not be filled after 5 pm or on weekends.  °4. You should follow a light diet for the remainder of the day after your procedure. °5. Most patients will experience some mild swelling and/or bruising in the area of the incision. It may take several days to resolve. °6. It is common to experience some constipation if taking pain medication after surgery. Increasing fluid intake and taking a stool softener (such as Colace) will usually help or prevent this problem from occurring. A mild laxative (Milk of Magnesia or Miralax) should be taken according to package directions if there are no bowel movements after 48 hours.  °7. Unless discharge instructions indicate otherwise, you may remove your bandages 48 hours after surgery, and you may shower at that time. You may have steri-strips (small white skin tapes) in place directly over the incision.  These strips should be left on the skin for 7-10 days.  If your surgeon used Dermabond (skin glue) on the incision, you may shower in 24 hours.  The glue will flake off over the next 2-3 weeks.  °8. If your port is left accessed at the end  of surgery (needle left in port), the dressing cannot get wet and should only by changed by a healthcare professional. When the port is no longer accessed (when the needle has been removed), follow step 7.   °9. ACTIVITIES:  Limit activity involving your arms for the next 72 hours. Do no strenuous exercise or activity for 1 week. You may drive when you are no longer taking prescription pain medication, you can comfortably wear a seatbelt, and you can maneuver your car. °10.You may need to see your doctor in the office for a follow-up appointment.  Please °      check with your doctor.  °11.When you receive a new Port-a-Cath, you will get a product guide and  °      ID card.  Please keep them in case you need them. ° °WHEN TO CALL YOUR DOCTOR ([phone]): °1. Fever over 101.0 °2. Chills °3. Continued bleeding from incision °4. Increased redness and tenderness at the site °5. Shortness of breath, difficulty breathing ° ° °The clinic staff is available to answer your questions during regular business hours. Please don’t hesitate to call and ask to speak to one of the nurses or medical assistants for clinical concerns. If you have a medical emergency, go to the nearest emergency room or call 911.  A surgeon from [facility] is always on call at the hospital.  ° ° ° °For further information, please visit [email or web address] ° ° ° ° °Post Anesthesia Home Care Instructions ° °Activity: °Get plenty of rest for the remainder of the day. A responsible adult should stay with you for 24 hours following the procedure.  °For the next 24 hours, DO NOT: °-Drive a car °-  Operate machinery °-Drink alcoholic beverages °-Take any medication unless instructed by your physician °-Make any legal decisions or sign important papers. ° °Meals: °Start with liquid foods such as gelatin or soup. Progress to regular foods as tolerated. Avoid greasy, spicy, heavy foods. If nausea and/or vomiting occur, drink only clear  liquids until the nausea and/or vomiting subsides. Call your physician if vomiting continues. ° °Special Instructions/Symptoms: °Your throat may feel dry or sore from the anesthesia or the breathing tube placed in your throat during surgery. If this causes discomfort, gargle with warm salt water. The discomfort should disappear within 24 hours. ° °

## 2014-02-12 NOTE — Transfer of Care (Signed)
Immediate Anesthesia Transfer of Care Note  Patient: Monica [name]  Procedure(s) Performed: Procedure(s): LEFT BREAST LUMPECTOMY WITH NEEDLE LOCALIZATION AND LEFT AXILLARY SENTINEL LYMPH NODE BX (Left) INSERTION PORT-A-CATH (Right)  Patient Location: PACU  Anesthesia Type:General  Level of Consciousness: awake and sedated  Airway & Oxygen Therapy: Patient Spontanous Breathing and Patient connected to face mask oxygen  Post-op Assessment: Report given to PACU RN and Post -op Vital signs reviewed and stable  Post vital signs: Reviewed and stable  Complications: No apparent anesthesia complications

## 2014-02-12 NOTE — Anesthesia Postprocedure Evaluation (Signed)
  Anesthesia Post-op Note  Patient: Monica [name]  Procedure(s) Performed: Procedure(s): LEFT BREAST LUMPECTOMY WITH NEEDLE LOCALIZATION AND LEFT AXILLARY SENTINEL LYMPH NODE BX (Left) INSERTION PORT-A-CATH (Right)  Patient Location: PACU  Anesthesia Type:General and block  Level of Consciousness: awake and alert   Airway and Oxygen Therapy: Patient Spontanous Breathing  Post-op Pain: moderate  Post-op Assessment: Post-op Vital signs reviewed, Patient's Cardiovascular Status Stable and Respiratory Function Stable  Post-op Vital Signs: Reviewed  Filed Vitals:   02/12/14 1545  BP: 127/67  Pulse: 70  Temp:   Resp: 17    Complications: No apparent anesthesia complications

## 2014-02-12 NOTE — Op Note (Signed)
Preoperative Diagnosis: left breast cancer  Postoprative Diagnosis: left breast cancer  Procedure: Procedure(s): Blue dye injection left breast, LEFT BREAST LUMPECTOMY WITH NEEDLE LOCALIZATION AND LEFT AXILLARY SENTINEL LYMPH NODE BX INSERTION PORT-A-CATH   Surgeon: Excell Seltzer T   Assistants: None  Anesthesia:  General LMA anesthesia  Indications: Patient is a 68 year old female with a recent diagnosis of multifocal triple negative invasive ductal carcinoma of the lower outer left breast. We have elected to proceed with needle localized left breast lumpectomy with left axillary sentinel lymph node biopsy and placement of Port-A-Cath as her initial surgical therapy. We discussed all these procedures in detail documented extensively elsewhere including indications the nature of the surgery and risks. Following successful needle localization and following injection of 1 mCi of technetium sulfur colloid intradermally around the left nipple prior to surgery the patient is brought to the operating room for these procedures.    Procedure Detail:  Patient was brought to the operating room, placed in the supine position on the operating table, a laryngeal mask general anesthesia induced. The left breast and axilla and upper arm were widely sterilely prepped and draped. Just prior to this under sterile technique after patient time out I injected 5 cc of dilute methylene blue subcutaneously beneath the left nipple and massaged this for several minutes. After full prepping and draping there were 2 localizing wires in the lower outer left breast bracketing and anterior and posterior mass the total area measuring about 4.5-5 cm. I made a curvilinear incision in the inferior breast between the 2 wire insertion sites and dissection was carried down into the subcutaneous tissue. Short skin and subcutaneous flaps were then raised superiorly and inferiorly and the localized wire brought into the incision. The  dissection was broadened in the lateral plane. The dissection was then deepened medially into the breast tissue in the anterior inferior and superior and posterior plane staying around both wires and at this point the 2 palpable masses. I continued medially in the halls well past the tips of the wires and the specimen was excised. The specimen mammogram was obtained showing the 2 intact localization wires and clips and 2 masses within the specimen. Grossly and by imaging the lateral margin clearly was the closest, particularly on the more inferior mass. I therefore further excised the complete lateral margin which was oriented with suture and sent as a separate margin. The excision did go down essentially to the chest wall and I took some posterior fascia as a permanent margin for permanent section. At this point the lateral margin was just skin and subcutaneous tissue. The wound was irrigated and complete hemostasis obtained. The cavity was marked with clips. The deep breast and subcutaneous tissue was closed with interrupted 3-0 Vicryl and the skin with subcuticular 4-0 Monocryl. Attention was turned to the sentinel node. A definite hot area in the left axilla was identified and a small transverse incision made over this point. Dissection was carried down to the subcutaneous tissue with cautery and the clavipectoral fascia incised and the axilla proper entered. Using the appropriate guide I dissected down onto a slightly enlarged blue node with very high counts. This was completely excised with cautery and ex vivo had counts of approximately 3000. There was still some background in the axilla to 300. Initially 2 small areas of tissue were excised and ex vivo had minimal counts and these were sent as axillary tissue. As I dissected little more superiorly I came down onto a node was slight  blue color and high counts and completely excise this with cautery. Ex vivo this node had counts of about 1000 and was sent as  hot blue axillary sentinel lymph node #2. At this point there was no significant background counts. No palpable adenopathy. Hemostasis was obtained in this wound after irrigation. The deep and subcutaneous tissues was closed with interrupted 3-0 Vicryl the skin with subcuticular 4-0 Monocryl and Dermabond on this and the breast incision.. The patient was then repositioned Arms tucked and a roll behind her shoulder blades for Port-A-Cath placement and the entire chest and neck were widely sterilely prepped and draped. Patient time was again performed and crit procedure verified. I initially approached the right subclavian vein but could not get blood return. I therefore approached the right internal jugular vein and this was cannulated with a needle and guidewire without difficulty confirmed by fluoroscopy with the guidewire going down below the diaphragm. The introducer was placed via the guidewire and the flush catheter placed via the introducer which was stripped away. The tip of the catheter was positioned in the distal superior vena cava. A site on the anterior chest wall was chosen for the port a small transverse incision made the saphenous pocket created. The catheter was tunneled subcutaneously to the pocket and trimmed to length and attached to the port which was sutured to the anterior chest wall with 2 interrupted 2-0 Prolene sutures. Fluoroscopy showed the catheter in good position. The subcutaneous was closed with interrupted 4-0 Monocryl the skin was with 4-0 Monocryl and Dermabond.The catheter flushed and aspirated easily and was left flushed with concentrated heparin solution.Sponge needle and Instrument counts were correct.    Findings: As above  Estimated Blood Loss:  Minimal         Drains: none  Blood Given: none          Specimens: #1 left breast lumpectomy    #2 further lateral margin oriented with suture     #3 further shaved not oriented posterior margin     #4 hot blue left  axillary sentinel lymph node    #5 left axillary tissue    #6 hot blue left axillary sentinel lymph node        Complications:  * No complications entered in OR log *         Disposition: PACU - hemodynamically stable.         Condition: stable

## 2014-02-12 NOTE — Progress Notes (Signed)
Assisted Dr. Ola Spurr with left, ultrasound guided, pectoralis block. Side rails up, monitors on throughout procedure. See vital signs in flow sheet. Tolerated Procedure well.

## 2014-02-12 NOTE — Anesthesia Preprocedure Evaluation (Addendum)
Anesthesia Evaluation  Patient identified by MRN, date of birth, ID band Patient awake    Reviewed: Allergy & Precautions, H&P , NPO status , Patient's Chart, lab work & pertinent test results  Airway Mallampati: II TM Distance: >3 FB Neck ROM: Full    Dental no notable dental hx. (+) Teeth Intact, Dental Advisory Given   Pulmonary neg pulmonary ROS,  breath sounds clear to auscultation  Pulmonary exam normal       Cardiovascular hypertension, Pt. on medications Rhythm:Regular Rate:Normal     Neuro/Psych negative neurological ROS  negative psych ROS   GI/Hepatic negative GI ROS, Neg liver ROS,   Endo/Other  Morbid obesity  Renal/GU negative Renal ROS  negative genitourinary   Musculoskeletal   Abdominal   Peds  Hematology negative hematology ROS (+)   Anesthesia Other Findings   Reproductive/Obstetrics negative OB ROS                          Anesthesia Physical Anesthesia Plan  ASA: III  Anesthesia Plan: General and Regional   Post-op Pain Management:    Induction: Intravenous  Airway Management Planned: LMA  Additional Equipment:   Intra-op Plan:   Post-operative Plan: Extubation in OR  Informed Consent: I have reviewed the patients History and Physical, chart, labs and discussed the procedure including the risks, benefits and alternatives for the proposed anesthesia with the patient or authorized representative who has indicated his/her understanding and acceptance.   Dental advisory given  Plan Discussed with: CRNA  Anesthesia Plan Comments:         Anesthesia Quick Evaluation

## 2014-02-12 NOTE — Interval H&P Note (Signed)
History and Physical Interval Note:  02/12/2014 11:39 AM  Monica [name]  has presented today for surgery, with the diagnosis of left breast cancer  The various methods of treatment have been discussed with the patient and family. After consideration of risks, benefits and other options for treatment, the patient has consented to  Procedure(s): LEFT BREAST LUMPECTOMY WITH NEEDLE LOCALIZATION AND LEFT AXILLARY SENTINEL LYMPH NODE BX (Left) INSERTION PORT-A-CATH (N/A) as a surgical intervention .  The patient's history has been reviewed, patient examined, no change in status, stable for surgery.  I have reviewed the patient's chart and labs.  Questions were answered to the patient's satisfaction.     Josie Mesa T

## 2014-02-13 ENCOUNTER — Encounter (HOSPITAL_BASED_OUTPATIENT_CLINIC_OR_DEPARTMENT_OTHER): Payer: Self-pay | Admitting: General Surgery

## 2014-02-15 ENCOUNTER — Telehealth (INDEPENDENT_AMBULATORY_CARE_PROVIDER_SITE_OTHER): Payer: Self-pay

## 2014-02-16 ENCOUNTER — Telehealth: Payer: Self-pay | Admitting: Oncology

## 2014-02-16 ENCOUNTER — Ambulatory Visit (HOSPITAL_BASED_OUTPATIENT_CLINIC_OR_DEPARTMENT_OTHER): Payer: Medicare (Managed Care) | Admitting: Oncology

## 2014-02-16 VITALS — BP 133/59 | HR 60 | Temp 98.1°F | Resp 18 | Ht 66.0 in | Wt 255.5 lb

## 2014-02-16 DIAGNOSIS — C50519 Malignant neoplasm of lower-outer quadrant of unspecified female breast: Secondary | ICD-10-CM

## 2014-02-16 DIAGNOSIS — C50512 Malignant neoplasm of lower-outer quadrant of left female breast: Secondary | ICD-10-CM

## 2014-02-16 DIAGNOSIS — Z171 Estrogen receptor negative status [ER-]: Secondary | ICD-10-CM

## 2014-02-16 MED ORDER — DEXAMETHASONE 4 MG PO TABS: ORAL_TABLET | ORAL | Status: DC

## 2014-02-16 MED ORDER — LIDOCAINE-PRILOCAINE 2.5-2.5 % EX CREA: 1.0000 "application " | TOPICAL_CREAM | CUTANEOUS | Status: DC | PRN

## 2014-02-16 MED ORDER — LORAZEPAM 0.5 MG PO TABS: 0.5000 mg | ORAL_TABLET | Freq: Every evening | ORAL | Status: DC | PRN

## 2014-02-16 MED ORDER — PROCHLORPERAZINE MALEATE 10 MG PO TABS: 10.0000 mg | ORAL_TABLET | Freq: Four times a day (QID) | ORAL | Status: DC | PRN

## 2014-02-16 MED ORDER — ONDANSETRON HCL 8 MG PO TABS: 8.0000 mg | ORAL_TABLET | Freq: Two times a day (BID) | ORAL | Status: DC | PRN

## 2014-02-16 NOTE — Progress Notes (Signed)
[place]  [phone] [phone]     ID: Monica [name] DOB: 1946/06/11  MR#: [id number]  DXA#:[id number]  Patient Care Team: Maury Dus, MD as PCP - General (Family Medicine) [name], MD as Consulting Physician (General Surgery) Chauncey Cruel, MD as Consulting Physician (Oncology) Marye Round, MD as Consulting Physician (Radiation Oncology) Maisie Fus, MD as Consulting Physician (Obstetrics and Gynecology)   CHIEF COMPLAINT: Newly diagnosed breast cancer  CURRENT TREATMENT: Starting adjuvant chemotherapy   BREAST CANCER HISTORY: [name] showing a possible mass in her left breast. On 01/05/2014 left diagnostic mammography and ultrasonography at the breast Center showed 2 adjacent poorly defined masses deep in the lower outer portion of the left breast. On physical exam these were palpable measuring approximately 3 cm altogether, at the 4:00 position 16 cm from the left nipple. There were no palpable left axillary lymph nodes. Ultrasound confirmed to and adjacent irregularly marginated hypoechoic masses in the area in question, measuring 1.5 and 1.0 cm respectively. The total area of by ultrasonography measured 2.6 cm. In addition, to left axillary lymph nodes showed mild cortical thickening in one of them showed loss of the normal fatty hilum.  On 01/10/2014 the patient underwent separate biopsies of both the left breast masses in question as well as the more suspicious lymph node. The final pathology ([id number]) showed the lymph node to be benign. (This was interpreted as concordant). Both of the breast masses, however, were positive for an invasive ductal carcinoma, which was HER-2 not amplified, with the signals ratio of 0.98 and the number per cell being 2.45. Estrogen and progesterone receptor determination is pending but looks negative at this point.  On  01/16/2014 the patient underwent bilateral breast MRI the right breast was unremarkable. In the left breast lower outer quadrant there were 2 oval masses measuring 1.5 and 2.2 cm. Taken together they was approximately 4 cm of disease. There were no findings of concern in the right breast. In the left axilla there was a single mildly prominent lymph node. There was no suspicious internal mammary adenopathy.  The patient's subsequent history is as detailed below  INTERVAL HISTORY: Monica [name] returns today for followup of her breast cancer accompanied by her husband Monica [name], sister Monica [name], and goddaughter Monica [name]. Since her last visit here she underwent left lumpectomy and sentinel lymph node sampling [on 02/12/2014]. The final pathology ([id number]) showed invasive ductal carcinoma in 2 separate nodules measuring 2.2 cm and 1.2 cm, both tumorsbeing grade 3. Both sentinel lymph nodes were negative. Margins were clear. HER-2/neu is being repeated in one of the lymph nodes but is not yet available  REVIEW OF SYSTEMS: Monica [name] did well with her surgery, without unusual pain, fever, or bleeding. The biggest problem she is having is knee discomfort. This keeps her from walking which has been her chief form of exercise. A detailed review of systems today was otherwise noncontributory  PAST MEDICAL HISTORY: Past Medical History  Diagnosis Date  . Allergy codeine    rapid heart beat, cold sweat  . Arthritis   . Breast cancer   . Hypertension     PAST SURGICAL HISTORY: Past Surgical History  Procedure Laterality Date  . Knee arthroscopy  2012    right  . Lumbar laminectomy  1991  . Colonoscopy    . Foot arthroplasty  1998    right  . Abdominal hysterectomy  1992  . Breast  lumpectomy with needle localization and axillary sentinel lymph node bx Left 02/12/2014    Procedure: LEFT BREAST LUMPECTOMY WITH NEEDLE LOCALIZATION AND LEFT AXILLARY SENTINEL LYMPH NODE BX;  Surgeon: [name], MD;   Location: [facility];  Service: General;  Laterality: Left;  . Portacath placement Right 02/12/2014    Procedure: INSERTION PORT-A-CATH;  Surgeon: [name], MD;  Location: [place];  Service: General;  Laterality: Right;    FAMILY HISTORY Family History  Problem Relation Age of Onset  . Cancer Sister   . Cancer Brother   . Cancer Maternal Aunt   . Cancer Paternal Aunt    the patient's father died at the age of 39 from a stroke. The patient's mother died at the age of 37 from "multiple causes". The patient had 2 brothers and 2 sisters. The patient's sister was diagnosed with uterine cancer at the age of 15, and the patient's brother Monica [name] had "bone cancer" and died in his 56s. He was treated at the [facility]. There is no history of breast or ovarian cancer in the family to the patient's knowledge.  GYNECOLOGIC HISTORY:  No LMP recorded. Patient has had a hysterectomy. Menarche age 64, the patient is GX P0. She underwent hysterectomy in 1992, with no salpingo-oophorectomy. She has been on estrogen replacement since. 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 Her husband Monica [name] worked for [employer] in [place] as a Facilities manager. The patient's adopted son Monica [name] works as a laborer in [place]. The patient's god-daughter Monica [name] is an Optometrist. The patient has no grandchildren. The patient attends a local church of  [employer], and her husband Monica [name] is pastor    ADVANCED DIRECTIVES: In place   HEALTH MAINTENANCE: History  Substance Use Topics  . Smoking status: Never Smoker   . Smokeless tobacco: Not on file  . Alcohol Use: No     Colonoscopy: October 2008  PAP: July 2015  Bone density: 01/01/2014  Lipid panel:  Allergies  Allergen Reactions  . Codeine     Heart fast    Current Outpatient  Prescriptions  Medication Sig Dispense Refill  . amLODipine (NORVASC) 5 MG tablet Take 5 mg by mouth daily.      . Cholecalciferol (VITAMIN D3) 2000 UNITS TABS Take 1 tablet by mouth daily.      [name] docusate sodium (COLACE) 100 MG capsule Take 100 mg by mouth 2 (two) times daily.      . Ginkgo Biloba 120 MG CAPS Take 1 capsule by mouth daily.      . meloxicam (MOBIC) 15 MG tablet Take 15 mg by mouth daily as needed for pain.      [name] oxyCODONE (OXY IR/ROXICODONE) 5 MG immediate release tablet Take 1-2 tablets (5-10 mg total) by mouth every 4 (four) hours as needed for moderate pain, severe pain or breakthrough pain.  40 tablet  0  . UNABLE TO FIND Take 1 tablet by mouth daily. OMEGA XL      . valsartan-hydrochlorothiazide (DIOVAN-HCT) 320-25 MG per tablet Take 1 tablet by mouth daily.       No current facility-administered medications for this visit.    OBJECTIVE: Middle-aged [place] American woman who appears stated age 19 Vitals:   02/16/14 1310  BP: 133/59  Pulse: 60  Temp: 98.1 F (36.7 C)  Resp: 18  Body mass index is 41.26 kg/(m^2).    ECOG FS:1 - Symptomatic but completely ambulatory  Ocular: Sclerae unicteric, EOMs intact Ear-nose-throat: Oropharynx clear, teeth in fair repair Lymphatic: No cervical or supraclavicular adenopathy Lungs no rales or rhonchi Heart regular rate and rhythm Abd soft, obese, nontender, positive bowel sounds MSK no focal spinal tenderness, no joint edema Neuro: non-focal, well-oriented, appropriate affect Breasts: The right breast is unremarkable. The left breast is status post recent lumpectomy and sentinel lymph node sampling. The incisions are healing nicely. The cosmetic result is excellent. The left axilla is benign.   LAB RESULTS:  CMP     Component Value Date/Time   NA 140 01/17/2014 1219   NA 141 11/03/2010 1352   K 3.3* 01/17/2014 1219   K 3.3* 11/03/2010 1352   CO2 27 01/17/2014 1219   GLUCOSE 126 01/17/2014 1219   GLUCOSE 83  11/03/2010 1352   BUN 12.7 01/17/2014 1219   CREATININE 0.9 01/17/2014 1219   CALCIUM 9.5 01/17/2014 1219   PROT 7.4 01/17/2014 1219   ALBUMIN 3.3* 01/17/2014 1219   AST 20 01/17/2014 1219   ALT 14 01/17/2014 1219   ALKPHOS 82 01/17/2014 1219   BILITOT 0.26 01/17/2014 1219    I No results found for this basename: SPEP,  UPEP,   kappa and lambda light chains    Lab Results  Component Value Date   WBC 5.2 01/17/2014   NEUTROABS 2.6 01/17/2014   HGB 13.0 01/17/2014   HCT 39.1 01/17/2014   MCV 83.4 01/17/2014   PLT 286 01/17/2014      Chemistry      Component Value Date/Time   NA 140 01/17/2014 1219   NA 141 11/03/2010 1352   K 3.3* 01/17/2014 1219   K 3.3* 11/03/2010 1352   CO2 27 01/17/2014 1219   BUN 12.7 01/17/2014 1219   CREATININE 0.9 01/17/2014 1219      Component Value Date/Time   CALCIUM 9.5 01/17/2014 1219   ALKPHOS 82 01/17/2014 1219   AST 20 01/17/2014 1219   ALT 14 01/17/2014 1219   BILITOT 0.26 01/17/2014 1219       No results found for this basename: LABCA2    No components found with this basename: LABCA125    No results found for this basename: INR,  in the last 168 hours  Urinalysis No results found for this basename: colorurine,  appearanceur,  labspec,  phurine,  glucoseu,  hgbur,  bilirubinur,  ketonesur,  proteinur,  urobilinogen,  nitrite,  leukocytesur    STUDIES: Nm Sentinel Node Inj-no Rpt (breast)  02/12/2014   CLINICAL DATA: Cancer left breast   Sulfur colloid was injected intradermally by the nuclear medicine  technologist for breast cancer sentinel node localization.    Mm Breast Surgical Specimen  02/12/2014   CLINICAL DATA:  Status post excision of 2 left breast masses falling mammographically guided needle localization.  EXAM: SPECIMEN RADIOGRAPH OF THE LEFT BREAST  COMPARISON:  Previous exam(s)  FINDINGS: Status post excision of the left breast. The wire tips and biopsy marker clips are present and are marked for pathology.  IMPRESSION: Specimen  radiograph of the left breast.   Electronically Signed   By: Lajean Manes M.D.   On: 02/12/2014 13:31   Dg Chest Port 1 View  02/12/2014   CLINICAL DATA:  Post port a cath placement.  EXAM: PORTABLE CHEST - 1 VIEW  COMPARISON:  None.  FINDINGS: Right IJ power Port-A-Cath is present. The distal tip projects  over the junction of the superior vena cava and right atrium. Heart size appears enlarged on this portable chest radiograph. Pulmonary vascularity is congested. Lung volumes are low. There is mild diffuse bilateral interstitial prominence. Streaky left basilar atelectasis. No visible pneumothorax. No evidence of pleural effusion.  Lucencies project over the soft tissues of the left axillary region/axillary tail of the left breast.  IMPRESSION: 1. Satisfactory position of right IJ power Port-A-Cath. 2. No visualized pneumothorax. 3. Possible subcutaneous gas along the contralateral (left) axillary region/axillary tail of the left breast. Suggest clinical correlation. 4. Cardiomegaly with pulmonary vascular congestion. Interstitial prominence may be accentuated by low lung volumes, but interstitial pulmonary edema is not excluded. These results will be called to the ordering clinician or representative by the Radiologist Assistant, and communication documented in the PACS or zVision Dashboard.   Electronically Signed   By: Curlene Dolphin M.D.   On: 02/12/2014 15:41   Dg Fluoro Guide Cv Line-no Report  02/12/2014   CLINICAL DATA: P-A-C   FLOURO GUIDE CV LINE  Fluoroscopy was utilized by the requesting physician.  No radiographic  interpretation.    Mm Lt Plc Breast Loc Dev   1st Lesion  Inc Mammo Guide  02/12/2014   CLINICAL DATA:  Patient presents for needle localization of 2 closely approximated invasive ductal carcinomas in the lateral left breast.  EXAM: NEEDLE LOCALIZATION OF THE LEFT BREAST WITH MAMMO GUIDANCE  COMPARISON:  Previous exams.  FINDINGS: Patient presents for needle localization prior to  surgical excision. I met with the patient and we discussed the procedure of needle localization including benefits and alternatives. We discussed the high likelihood of a successful procedure. We discussed the risks of the procedure, including infection, bleeding, tissue injury, and further surgery. Informed, written consent was given. The usual time-out protocol was performed immediately prior to the procedure.  Using mammographic guidance, sterile technique, 2% lidocaine and 2 7 cm modified Kopans needles, both biopsy clips in the lateral left breast were localized using lateral approach. The films were marked for Dr. Excell Seltzer.  IMPRESSION: Needle localization left breast. No apparent complications.   Electronically Signed   By: Lajean Manes M.D.   On: 02/12/2014 10:57   ASSESSMENT: 68 y.o. [place] woman status post left breast biopsy of 2 separate lower outer quadrant masses 01/10/2014 for a multifocal cT2 cNX, stage II invasive ductal carcinoma, grade 3, with an MIB-1 of 90%, HER-2 negative, estrogen receptors 3% positive with weak staining, progesterone receptor negative, with an MIB-1 of 90%; this is clinically a "triple-negative" breast cancer and will be treated as such  (1) a suspicious left axillary lymph node biopsied 01/10/2014 was negative (concordant)  (2) left lumpectomy and sentinel lymph node sampling 02/12/2014 showed an mpT2 pN0, stage IIA invasive ductal carcinoma grade 3, repeat HER-2 pending  (3) adjuvant chemotherapy starting 03/12/2014 to consist of dose dense doxorubicin and cyclophosphamide x4, with Neulasta support, followed by weekly paclitaxel and carboplatin x12  (4) adjuvant radiation to follow chemotherapy  PLAN: We went over Monica [name]'s situation in detail today. As previously discussed, we would not expect any significant benefit from antiestrogens in this case, and so her tumor is being treated as triple negative. The benefit of chemotherapy, as assessed through the  adjuvant! Program, shows a 7% absolute decrease in mortality and a 12% absolute decrease in risk of relapse with the current standard of care chemotherapy. The plan will be for dose dense doxorubicin and cyclophosphamide x4 followed by weekly paclitaxel and carboplatin  x12. This will be followed by radiation to complete her local therapy.  In general with triple-negative tumors we prefer to start adjuvant therapy within a month, so the target start dated September 21. Today I gave her a "roadmap" on how to take her supportive medications, and wrote all the relevant prescriptions for her. I entered the chemotherapy orders and I entered the followup appointments for the first 2 cycles. Monica [name] already had an echocardiogram with excellent ejection fraction, already came to chemotherapy school, and a ready has a port in place.  She has a good understanding of the overall plan. She agrees with it. She knows the goal of treatment in her case is cure. She will begin an exercise program to the level that she is capable of, and I gave her information on the [facility] program for her to explore. She will call with any problems that may develop before her next visit here.  Chauncey Cruel, MD   02/16/2014 1:39 PM

## 2014-02-16 NOTE — Telephone Encounter (Signed)
gv pt appt schedule for sept thru nov

## 2014-02-20 ENCOUNTER — Other Ambulatory Visit: Payer: Self-pay | Admitting: *Deleted

## 2014-02-21 ENCOUNTER — Encounter: Payer: Self-pay | Admitting: *Deleted

## 2014-02-23 ENCOUNTER — Telehealth (INDEPENDENT_AMBULATORY_CARE_PROVIDER_SITE_OTHER): Payer: Self-pay | Admitting: General Surgery

## 2014-02-23 NOTE — Telephone Encounter (Signed)
Call the patient and discussed pathology report. She is doing reasonably well from surgery with gradually decreasing pain.

## 2014-02-28 ENCOUNTER — Telehealth (INDEPENDENT_AMBULATORY_CARE_PROVIDER_SITE_OTHER): Payer: Self-pay

## 2014-02-28 NOTE — Telephone Encounter (Signed)
Please give patient a prescription for Vicodin 5/325 1- 2 every 4 hours as needed #30.  I will be in the hospital all week so someone will have to write it for me.

## 2014-02-28 NOTE — Telephone Encounter (Signed)
(  msg also in allscripts) pt called in requesting pain medication. She had a lumpectomy with SNLN bx on 8/24. She called and spoke to someone in triage last week and was told to take advil. She states the advil is not working. She has shooting pain in her breast going down to nipple and from axilla down her arm with tingling and numbness. Advised with her type of surgery this is common because the sensory nerves are in same area to get to the lymph nodes. It may get better but some patients will have tingling and numbness on and off permanelty. Pt still requesting Dr Excell Seltzer refill her pain medication. She was given oxy 5 mg after surgery but said she would take something not as strong if he was ok with it. Please advise.Marland KitchenMarland Kitchen

## 2014-03-12 ENCOUNTER — Ambulatory Visit (HOSPITAL_BASED_OUTPATIENT_CLINIC_OR_DEPARTMENT_OTHER): Payer: Medicare (Managed Care)

## 2014-03-12 ENCOUNTER — Ambulatory Visit (HOSPITAL_BASED_OUTPATIENT_CLINIC_OR_DEPARTMENT_OTHER): Payer: Medicare (Managed Care) | Admitting: Nurse Practitioner

## 2014-03-12 ENCOUNTER — Encounter: Payer: Self-pay | Admitting: Nurse Practitioner

## 2014-03-12 ENCOUNTER — Other Ambulatory Visit (HOSPITAL_BASED_OUTPATIENT_CLINIC_OR_DEPARTMENT_OTHER): Payer: Medicare (Managed Care)

## 2014-03-12 ENCOUNTER — Other Ambulatory Visit: Payer: Self-pay | Admitting: Oncology

## 2014-03-12 VITALS — BP 156/77 | HR 82 | Temp 99.4°F | Resp 18 | Ht 66.0 in | Wt 248.6 lb

## 2014-03-12 DIAGNOSIS — C50512 Malignant neoplasm of lower-outer quadrant of left female breast: Secondary | ICD-10-CM

## 2014-03-12 DIAGNOSIS — C50519 Malignant neoplasm of lower-outer quadrant of unspecified female breast: Secondary | ICD-10-CM

## 2014-03-12 DIAGNOSIS — Z5111 Encounter for antineoplastic chemotherapy: Secondary | ICD-10-CM

## 2014-03-12 DIAGNOSIS — Z171 Estrogen receptor negative status [ER-]: Secondary | ICD-10-CM

## 2014-03-12 LAB — CBC WITH DIFFERENTIAL/PLATELET
BASO%: 1.8 % (ref 0.0–2.0)
Basophils Absolute: 0.1 10*3/uL (ref 0.0–0.1)
EOS%: 2.5 % (ref 0.0–7.0)
Eosinophils Absolute: 0.1 10*3/uL (ref 0.0–0.5)
HCT: 41 % (ref 34.8–46.6)
HEMOGLOBIN: 13.1 g/dL (ref 11.6–15.9)
LYMPH#: 2 10*3/uL (ref 0.9–3.3)
LYMPH%: 35.9 % (ref 14.0–49.7)
MCH: 27.3 pg (ref 25.1–34.0)
MCHC: 31.9 g/dL (ref 31.5–36.0)
MCV: 85.6 fL (ref 79.5–101.0)
MONO#: 0.5 10*3/uL (ref 0.1–0.9)
MONO%: 8.1 % (ref 0.0–14.0)
NEUT#: 2.9 10*3/uL (ref 1.5–6.5)
NEUT%: 51.7 % (ref 38.4–76.8)
Platelets: 310 10*3/uL (ref 145–400)
RBC: 4.79 10*6/uL (ref 3.70–5.45)
RDW: 14.4 % (ref 11.2–14.5)
WBC: 5.6 10*3/uL (ref 3.9–10.3)

## 2014-03-12 LAB — COMPREHENSIVE METABOLIC PANEL (CC13)
ALBUMIN: 3.4 g/dL — AB (ref 3.5–5.0)
ALT: 19 U/L (ref 0–55)
ANION GAP: 9 meq/L (ref 3–11)
AST: 17 U/L (ref 5–34)
Alkaline Phosphatase: 83 U/L (ref 40–150)
BUN: 15.1 mg/dL (ref 7.0–26.0)
CO2: 26 mEq/L (ref 22–29)
CREATININE: 0.9 mg/dL (ref 0.6–1.1)
Calcium: 9.7 mg/dL (ref 8.4–10.4)
Chloride: 103 mEq/L (ref 98–109)
Glucose: 101 mg/dl (ref 70–140)
POTASSIUM: 3.7 meq/L (ref 3.5–5.1)
Sodium: 139 mEq/L (ref 136–145)
TOTAL PROTEIN: 7.9 g/dL (ref 6.4–8.3)
Total Bilirubin: 0.37 mg/dL (ref 0.20–1.20)

## 2014-03-12 MED ORDER — DEXAMETHASONE SODIUM PHOSPHATE 20 MG/5ML IJ SOLN
INTRAMUSCULAR | Status: AC
  Filled 2014-03-12: qty 5

## 2014-03-12 MED ORDER — PALONOSETRON HCL INJECTION 0.25 MG/5ML
INTRAVENOUS | Status: AC
  Filled 2014-03-12: qty 5

## 2014-03-12 MED ORDER — DEXAMETHASONE SODIUM PHOSPHATE 20 MG/5ML IJ SOLN
12.0000 mg | Freq: Once | INTRAMUSCULAR | Status: AC
  Administered 2014-03-12: 12 mg via INTRAVENOUS

## 2014-03-12 MED ORDER — SODIUM CHLORIDE 0.9 % IV SOLN
Freq: Once | INTRAVENOUS | Status: AC
  Administered 2014-03-12: 10:00:00 via INTRAVENOUS

## 2014-03-12 MED ORDER — SODIUM CHLORIDE 0.9 % IV SOLN
150.0000 mg | Freq: Once | INTRAVENOUS | Status: AC
  Administered 2014-03-12: 150 mg via INTRAVENOUS
  Filled 2014-03-12: qty 5

## 2014-03-12 MED ORDER — PALONOSETRON HCL INJECTION 0.25 MG/5ML
0.2500 mg | Freq: Once | INTRAVENOUS | Status: AC
  Administered 2014-03-12: 0.25 mg via INTRAVENOUS

## 2014-03-12 MED ORDER — DOXORUBICIN HCL CHEMO IV INJECTION 2 MG/ML
60.0000 mg/m2 | Freq: Once | INTRAVENOUS | Status: AC
  Administered 2014-03-12: 140 mg via INTRAVENOUS
  Filled 2014-03-12: qty 70

## 2014-03-12 MED ORDER — SODIUM CHLORIDE 0.9 % IV SOLN
600.0000 mg/m2 | Freq: Once | INTRAVENOUS | Status: AC
  Administered 2014-03-12: 1400 mg via INTRAVENOUS
  Filled 2014-03-12: qty 70

## 2014-03-12 MED ORDER — HEPARIN SOD (PORK) LOCK FLUSH 100 UNIT/ML IV SOLN
500.0000 [IU] | Freq: Once | INTRAVENOUS | Status: DC | PRN
  Filled 2014-03-12: qty 5

## 2014-03-12 MED ORDER — SODIUM CHLORIDE 0.9 % IJ SOLN
10.0000 mL | INTRAMUSCULAR | Status: DC | PRN
  Filled 2014-03-12: qty 10

## 2014-03-12 NOTE — Progress Notes (Signed)
[place]  [phone] [phone]     ID: Monica [name] DOB: 1946/04/27  MR#: [id number]  HYQ#:[id number]  Patient Care Team: Maury Dus, MD as PCP - General (Family Medicine) Excell Seltzer, MD as Consulting Physician (General Surgery) Chauncey Cruel, MD as Consulting Physician (Oncology) Marye Round, MD as Consulting Physician (Radiation Oncology) Maisie Fus, MD as Consulting Physician (Obstetrics and Gynecology)   CHIEF COMPLAINT: Newly diagnosed breast cancer  CURRENT TREATMENT: Starting adjuvant chemotherapy   BREAST CANCER HISTORY: [name] showing a possible mass in her left breast. On 01/05/2014 left diagnostic mammography and ultrasonography at the breast Center showed 2 adjacent poorly defined masses deep in the lower outer portion of the left breast. On physical exam these were palpable measuring approximately 3 cm altogether, at the 4:00 position 16 cm from the left nipple. There were no palpable left axillary lymph nodes. Ultrasound confirmed to and adjacent irregularly marginated hypoechoic masses in the area in question, measuring 1.5 and 1.0 cm respectively. The total area of by ultrasonography measured 2.6 cm. In addition, to left axillary lymph nodes showed mild cortical thickening in one of them showed loss of the normal fatty hilum.  On 01/10/2014 the patient underwent separate biopsies of both the left breast masses in question as well as the more suspicious lymph node. The final pathology ([id number]) showed the lymph node to be benign. (This was interpreted as concordant). Both of the breast masses, however, were positive for an invasive ductal carcinoma, which was HER-2 not amplified, with the signals ratio of 0.98 and the number per cell being 2.45. Estrogen and progesterone receptor determination is pending but looks negative at this point.  On  01/16/2014 the patient underwent bilateral breast MRI the right breast was unremarkable. In the left breast lower outer quadrant there were 2 oval masses measuring 1.5 and 2.2 cm. Taken together they was approximately 4 cm of disease. There were no findings of concern in the right breast. In the left axilla there was a single mildly prominent lymph node. There was no suspicious internal mammary adenopathy.  The patient's subsequent history is as detailed below  INTERVAL HISTORY: Monica [name] returns today for followup of her breast cancer accompanied by her goddaughter Monica [name]. Today is day 1, cycle 1 of 4 planned cycles doxorubicin and cyclophosphamide, with neulasta given on day 2 for granulocyte support.   REVIEW OF SYSTEMS: Monica [name] is still tender from her left lumpectomy. She is taking ibuprofen only PRN, the oxycodone is no longer necessary. A detailed review of systems today was otherwise noncontributory  PAST MEDICAL HISTORY: Past Medical History  Diagnosis Date  . Allergy codeine    rapid heart beat, cold sweat  . Arthritis   . Breast cancer   . Hypertension     PAST SURGICAL HISTORY: Past Surgical History  Procedure Laterality Date  . Knee arthroscopy  2012    right  . Lumbar laminectomy  1991  . Colonoscopy    . Foot arthroplasty  1998    right  . Abdominal hysterectomy  1992  . Breast lumpectomy with needle localization and axillary sentinel lymph node bx Left 02/12/2014    Procedure: LEFT BREAST LUMPECTOMY WITH NEEDLE LOCALIZATION AND LEFT AXILLARY SENTINEL LYMPH NODE BX;  Surgeon: [name], MD;  Location: [facility] [facility];  Service: General;  Laterality: Left;  . Portacath placement Right 02/12/2014    Procedure: INSERTION PORT-A-CATH;  Surgeon: [name], MD;  Location: [facility];  Service: General;  Laterality: Right;    FAMILY HISTORY Family History  Problem Relation Age of Onset  . Cancer Sister   . Cancer Brother   .  Cancer Maternal Aunt   . Cancer Paternal Aunt    the patient's father died at the age of 14 from a stroke. The patient's mother died at the age of 78 from "multiple causes". The patient had 2 brothers and 2 sisters. The patient's sister was diagnosed with uterine cancer at the age of 41, and the patient's brother [name] had "bone cancer" and died in his 90s. He was treated at the [facility]. There is no history of breast or ovarian cancer in the family to the patient's knowledge.  GYNECOLOGIC HISTORY:  No LMP recorded. Patient has had a hysterectomy. Menarche age 7, the patient is GX P0. She underwent hysterectomy in 1992, with no salpingo-oophorectomy. She has been on estrogen replacement since. 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 Her husband Monica [name] worked for [employer] in [place] as a Facilities manager. 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 The patient's god-daughter Monica [name] is an Optometrist. The patient has no grandchildren. The patient attends a local church of  [place], and her husband Monica [name] is pastor    ADVANCED DIRECTIVES: In place   HEALTH MAINTENANCE: History  Substance Use Topics  . Smoking status: Never Smoker   . Smokeless tobacco: Not on file  . Alcohol Use: No     Colonoscopy: October 2008  PAP: July 2015  Bone density: 01/01/2014  Lipid panel:  Allergies  Allergen Reactions  . Codeine     Heart fast    Current Outpatient Prescriptions  Medication Sig Dispense Refill  . amLODipine (NORVASC) 5 MG tablet Take 5 mg by mouth daily.      . Cholecalciferol (VITAMIN D3) 2000 UNITS TABS Take 1 tablet by mouth daily.      [name] dexamethasone (DECADRON) 4 MG tablet Take 2 tablets by mouth once a day on the day after chemotherapy and then take 2 tablets two times a day for 2 days. Take with food.  30  tablet  1  . docusate sodium (COLACE) 100 MG capsule Take 100 mg by mouth 2 (two) times daily.      [name] lidocaine-prilocaine (EMLA) cream Apply 1 application topically as needed. Apply over port site 1-2 hours before chemo and cover with plastic wrap  30 g  0  . LORazepam (ATIVAN) 0.5 MG tablet Take 1 tablet (0.5 mg total) by mouth at bedtime as needed (Nausea or vomiting).  30 tablet  0  . nystatin-triamcinolone ointment (MYCOLOG)       . ondansetron (ZOFRAN) 8 MG tablet Take 1 tablet (8 mg total) by mouth 2 (two) times daily as needed. Start on the third day after chemotherapy.  30 tablet  1  . prochlorperazine (COMPAZINE) 10 MG tablet Take 1 tablet (10 mg total) by mouth every 6 (six) hours as needed (Nausea or vomiting).  30 tablet  1  . UNABLE TO FIND Take 1 tablet by mouth daily. OMEGA XL      . valsartan-hydrochlorothiazide (DIOVAN-HCT) 320-25 MG per tablet Take 1 tablet by mouth daily.      . Ginkgo Biloba 120 MG CAPS Take 1 capsule by  mouth daily.      [name] HYDROcodone-acetaminophen (NORCO/VICODIN) 5-325 MG per tablet       . meloxicam (MOBIC) 15 MG tablet Take 15 mg by mouth daily as needed for pain.      [name] oxyCODONE (OXY IR/ROXICODONE) 5 MG immediate release tablet Take 1-2 tablets (5-10 mg total) by mouth every 4 (four) hours as needed for moderate pain, severe pain or breakthrough pain.  40 tablet  0   No current facility-administered medications for this visit.    OBJECTIVE: Middle-aged [place] American woman who appears stated age 24 Vitals:   03/12/14 0901  BP: 156/77  Pulse: 82  Temp: 99.4 F (37.4 C)  Resp: 18     Body mass index is 40.14 kg/(m^2).    ECOG FS:1 - Symptomatic but completely ambulatory  Skin: warm, dry  HEENT: sclerae anicteric, conjunctivae pink, oropharynx clear. No thrush or mucositis.  Lymph Nodes: No cervical or supraclavicular lymphadenopathy  Lungs: clear to auscultation bilaterally, no rales, wheezes, or rhonci  Heart: regular rate and rhythm    Abdomen: round, soft, non tender, positive bowel sounds  Musculoskeletal: No focal spinal tenderness, no peripheral edema  Neuro: non focal, well oriented, positive affect  Breasts: deferred  LAB RESULTS:  CMP     Component Value Date/Time   NA 139 03/12/2014 0846   NA 141 11/03/2010 1352   K 3.7 03/12/2014 0846   K 3.3* 11/03/2010 1352   CO2 26 03/12/2014 0846   GLUCOSE 101 03/12/2014 0846   GLUCOSE 83 11/03/2010 1352   BUN 15.1 03/12/2014 0846   CREATININE 0.9 03/12/2014 0846   CALCIUM 9.7 03/12/2014 0846   PROT 7.9 03/12/2014 0846   ALBUMIN 3.4* 03/12/2014 0846   AST 17 03/12/2014 0846   ALT 19 03/12/2014 0846   ALKPHOS 83 03/12/2014 0846   BILITOT 0.37 03/12/2014 0846    I No results found for this basename: SPEP,  UPEP,   kappa and lambda light chains    Lab Results  Component Value Date   WBC 5.6 03/12/2014   NEUTROABS 2.9 03/12/2014   HGB 13.1 03/12/2014   HCT 41.0 03/12/2014   MCV 85.6 03/12/2014   PLT 310 03/12/2014      Chemistry      Component Value Date/Time   NA 139 03/12/2014 0846   NA 141 11/03/2010 1352   K 3.7 03/12/2014 0846   K 3.3* 11/03/2010 1352   CO2 26 03/12/2014 0846   BUN 15.1 03/12/2014 0846   CREATININE 0.9 03/12/2014 0846      Component Value Date/Time   CALCIUM 9.7 03/12/2014 0846   ALKPHOS 83 03/12/2014 0846   AST 17 03/12/2014 0846   ALT 19 03/12/2014 0846   BILITOT 0.37 03/12/2014 0846       No results found for this basename: LABCA2    No components found with this basename: LABCA125    No results found for this basename: INR,  in the last 168 hours  Urinalysis No results found for this basename: colorurine,  appearanceur,  labspec,  phurine,  glucoseu,  hgbur,  bilirubinur,  ketonesur,  proteinur,  urobilinogen,  nitrite,  leukocytesur    STUDIES: Nm Sentinel Node Inj-no Rpt (breast)  02/12/2014   CLINICAL DATA: Cancer left breast   Sulfur colloid was injected intradermally by the nuclear medicine  technologist for breast cancer sentinel  node localization.    Mm Breast Surgical Specimen  02/12/2014   CLINICAL DATA:  Status post excision of 2  left breast masses falling mammographically guided needle localization.  EXAM: SPECIMEN RADIOGRAPH OF THE LEFT BREAST  COMPARISON:  Previous exam(s)  FINDINGS: Status post excision of the left breast. The wire tips and biopsy marker clips are present and are marked for pathology.  IMPRESSION: Specimen radiograph of the left breast.   Electronically Signed   By: Lajean Manes M.D.   On: 02/12/2014 13:31   Dg Chest Port 1 View  02/12/2014   CLINICAL DATA:  Post port a cath placement.  EXAM: PORTABLE CHEST - 1 VIEW  COMPARISON:  None.  FINDINGS: Right IJ power Port-A-Cath is present. The distal tip projects over the junction of the superior vena cava and right atrium. Heart size appears enlarged on this portable chest radiograph. Pulmonary vascularity is congested. Lung volumes are low. There is mild diffuse bilateral interstitial prominence. Streaky left basilar atelectasis. No visible pneumothorax. No evidence of pleural effusion.  Lucencies project over the soft tissues of the left axillary region/axillary tail of the left breast.  IMPRESSION: 1. Satisfactory position of right IJ power Port-A-Cath. 2. No visualized pneumothorax. 3. Possible subcutaneous gas along the contralateral (left) axillary region/axillary tail of the left breast. Suggest clinical correlation. 4. Cardiomegaly with pulmonary vascular congestion. Interstitial prominence may be accentuated by low lung volumes, but interstitial pulmonary edema is not excluded. These results will be called to the ordering clinician or representative by the Radiologist Assistant, and communication documented in the PACS or zVision Dashboard.   Electronically Signed   By: Curlene Dolphin M.D.   On: 02/12/2014 15:41   Dg Fluoro Guide Cv Line-no Report  02/12/2014   CLINICAL DATA: P-A-C   FLOURO GUIDE CV LINE  Fluoroscopy was utilized by the requesting  physician.  No radiographic  interpretation.    Mm Lt Plc Breast Loc Dev   1st Lesion  Inc Mammo Guide  02/12/2014   CLINICAL DATA:  Patient presents for needle localization of 2 closely approximated invasive ductal carcinomas in the lateral left breast.  EXAM: NEEDLE LOCALIZATION OF THE LEFT BREAST WITH MAMMO GUIDANCE  COMPARISON:  Previous exams.  FINDINGS: Patient presents for needle localization prior to surgical excision. I met with the patient and we discussed the procedure of needle localization including benefits and alternatives. We discussed the high likelihood of a successful procedure. We discussed the risks of the procedure, including infection, bleeding, tissue injury, and further surgery. Informed, written consent was given. The usual time-out protocol was performed immediately prior to the procedure.  Using mammographic guidance, sterile technique, 2% lidocaine and 2 7 cm modified Kopans needles, both biopsy clips in the lateral left breast were localized using lateral approach. The films were marked for Dr. Excell Seltzer.  IMPRESSION: Needle localization left breast. No apparent complications.   Electronically Signed   By: Lajean Manes M.D.   On: 02/12/2014 10:57   ASSESSMENT: 67 y.o. [place] woman status post left breast biopsy of 2 separate lower outer quadrant masses 01/10/2014 for a multifocal cT2 cNX, stage II invasive ductal carcinoma, grade 3, with an MIB-1 of 90%, HER-2 negative, estrogen receptors 3% positive with weak staining, progesterone receptor negative, with an MIB-1 of 90%; this is clinically a "triple-negative" breast cancer and will be treated as such  (1) a suspicious left axillary lymph node biopsied 01/10/2014 was negative (concordant)  (2) left lumpectomy and sentinel lymph node sampling 02/12/2014 showed an mpT2 pN0, stage IIA invasive ductal carcinoma grade 3, repeat HER-2 pending  (3) adjuvant chemotherapy starting 03/12/2014 to consist  of dose dense doxorubicin  and cyclophosphamide x4, with Neulasta support, followed by weekly paclitaxel and carboplatin x12  (4) adjuvant radiation to follow chemotherapy  PLAN: Ronneisha has significant support from her goddaughter who has typed up her antiemetic schedule into a calendar that also details her chemotherapy appointments. They both feel confident in following these instructions and are ready to begin with treatment today. The labs were reviewed in detail and were stable.   We will proceed with day 1, cycle 1 of doxorubicin and cyclophosphamide today. They will return next week for a nadir visit with Dr. Jana Hakim. Equilla understands and agrees with this plan. She knows the goal of treatment in her case is cure. She has been encouraged to call with any issues that might arise before her next visit here.   Marcelino Duster, NP   03/12/2014 9:48 AM

## 2014-03-12 NOTE — Patient Instructions (Signed)
[facility] Discharge Instructions for Patients Receiving Chemotherapy  Today you received the following chemotherapy agents  Ardiamycin and Cytoxan  To help prevent nausea and vomiting after your treatment, we encourage you to take your nausea medication    Compazine and Zofran as directedIf you develop nausea and vomiting that is not controlled by your nausea medication, call the clinic.   BELOW ARE SYMPTOMS THAT SHOULD BE REPORTED IMMEDIATELY:  *FEVER GREATER THAN 100.5 F  *CHILLS WITH OR WITHOUT FEVER  NAUSEA AND VOMITING THAT IS NOT CONTROLLED WITH YOUR NAUSEA MEDICATION  *UNUSUAL SHORTNESS OF BREATH  *UNUSUAL BRUISING OR BLEEDING  TENDERNESS IN MOUTH AND THROAT WITH OR WITHOUT PRESENCE OF ULCERS  *URINARY PROBLEMS  *BOWEL PROBLEMS  UNUSUAL RASH Items with * indicate a potential emergency and should be followed up as soon as possible.  Feel free to call the clinic you have any questions or concerns. The clinic phone number is [phone].

## 2014-03-13 ENCOUNTER — Telehealth: Payer: Self-pay | Admitting: *Deleted

## 2014-03-13 ENCOUNTER — Ambulatory Visit (HOSPITAL_BASED_OUTPATIENT_CLINIC_OR_DEPARTMENT_OTHER): Payer: Medicare (Managed Care)

## 2014-03-13 VITALS — BP 157/68 | HR 87 | Temp 98.2°F

## 2014-03-13 DIAGNOSIS — Z5189 Encounter for other specified aftercare: Secondary | ICD-10-CM

## 2014-03-13 DIAGNOSIS — C50519 Malignant neoplasm of lower-outer quadrant of unspecified female breast: Secondary | ICD-10-CM

## 2014-03-13 DIAGNOSIS — C50512 Malignant neoplasm of lower-outer quadrant of left female breast: Secondary | ICD-10-CM

## 2014-03-13 MED ORDER — PEGFILGRASTIM INJECTION 6 MG/0.6ML
6.0000 mg | Freq: Once | SUBCUTANEOUS | Status: AC
  Administered 2014-03-13: 6 mg via SUBCUTANEOUS
  Filled 2014-03-13: qty 0.6

## 2014-03-13 NOTE — Patient Instructions (Signed)
Pegfilgrastim injection What is this medicine? PEGFILGRASTIM (peg fil GRA stim) is a long-acting granulocyte colony-stimulating factor that stimulates the growth of neutrophils, a type of white blood cell important in the body's fight against infection. It is used to reduce the incidence of fever and infection in patients with certain types of cancer who are receiving chemotherapy that affects the bone marrow. This medicine may be used for other purposes; ask your health care provider or pharmacist if you have questions. COMMON BRAND NAME(S): Neulasta What should I tell my health care provider before I take this medicine? They need to know if you have any of these conditions: -latex allergy -ongoing radiation therapy -sickle cell disease -skin reactions to acrylic adhesives (On-Body Injector only) -an unusual or allergic reaction to pegfilgrastim, filgrastim, other medicines, foods, dyes, or preservatives -pregnant or trying to get pregnant -breast-feeding How should I use this medicine? This medicine is for injection under the skin. If you get this medicine at home, you will be taught how to prepare and give the pre-filled syringe or how to use the On-body Injector. Refer to the patient Instructions for Use for detailed instructions. Use exactly as directed. Take your medicine at regular intervals. Do not take your medicine more often than directed. It is important that you put your used needles and syringes in a special sharps container. Do not put them in a trash can. If you do not have a sharps container, call your pharmacist or healthcare provider to get one. Talk to your pediatrician regarding the use of this medicine in children. Special care may be needed. Overdosage: If you think you have taken too much of this medicine contact a poison control center or emergency room at once. NOTE: This medicine is only for you. Do not share this medicine with others. What if I miss a dose? It is  important not to miss your dose. Call your doctor or health care professional if you miss your dose. If you miss a dose due to an On-body Injector failure or leakage, a new dose should be administered as soon as possible using a single prefilled syringe for manual use. What may interact with this medicine? Interactions have not been studied. Give your health care provider a list of all the medicines, herbs, non-prescription drugs, or dietary supplements you use. Also tell them if you smoke, drink alcohol, or use illegal drugs. Some items may interact with your medicine. This list may not describe all possible interactions. Give your health care provider a list of all the medicines, herbs, non-prescription drugs, or dietary supplements you use. Also tell them if you smoke, drink alcohol, or use illegal drugs. Some items may interact with your medicine. What should I watch for while using this medicine? You may need blood work done while you are taking this medicine. If you are going to need a MRI, CT scan, or other procedure, tell your doctor that you are using this medicine (On-Body Injector only). What side effects may I notice from receiving this medicine? Side effects that you should report to your doctor or health care professional as soon as possible: -allergic reactions like skin rash, itching or hives, swelling of the face, lips, or tongue -dizziness -fever -pain, redness, or irritation at site where injected -pinpoint red spots on the skin -shortness of breath or breathing problems -stomach or side pain, or pain at the shoulder -swelling -tiredness -trouble passing urine Side effects that usually do not require medical attention (report to your doctor   or health care professional if they continue or are bothersome): -bone pain -muscle pain This list may not describe all possible side effects. Call your doctor for medical advice about side effects. You may report side effects to FDA at  1-800-FDA-1088. Where should I keep my medicine? Keep out of the reach of children. Store pre-filled syringes in a refrigerator between 2 and 8 degrees C (36 and 46 degrees F). Do not freeze. Keep in carton to protect from light. Throw away this medicine if it is left out of the refrigerator for more than 48 hours. Throw away any unused medicine after the expiration date. NOTE: This sheet is a summary. It may not cover all possible information. If you have questions about this medicine, talk to your doctor, pharmacist, or health care provider.  2015, Elsevier/Gold Standard. (2013-09-07 16:14:05)  

## 2014-03-13 NOTE — Telephone Encounter (Signed)
Telecia here for Neulasta injection following 1st adria/cyt chemo treatment.  States that she is doing well.  Had some queasiness this morning and took her antiemetic which relieved it.  No vomiting or diarrhea.  Is drinking her fluids and eating some.  All questions answered.  Knows to call if she has any problems or concerns.

## 2014-03-15 ENCOUNTER — Telehealth: Payer: Self-pay | Admitting: Nurse Practitioner

## 2014-03-15 NOTE — Telephone Encounter (Signed)
, °

## 2014-03-20 ENCOUNTER — Other Ambulatory Visit (HOSPITAL_BASED_OUTPATIENT_CLINIC_OR_DEPARTMENT_OTHER): Payer: Medicare (Managed Care)

## 2014-03-20 ENCOUNTER — Other Ambulatory Visit: Payer: Self-pay | Admitting: *Deleted

## 2014-03-20 ENCOUNTER — Ambulatory Visit (HOSPITAL_BASED_OUTPATIENT_CLINIC_OR_DEPARTMENT_OTHER): Payer: Medicare (Managed Care) | Admitting: Oncology

## 2014-03-20 VITALS — BP 164/69 | HR 80 | Temp 99.1°F | Resp 18 | Ht 66.0 in | Wt 248.9 lb

## 2014-03-20 DIAGNOSIS — B370 Candidal stomatitis: Secondary | ICD-10-CM

## 2014-03-20 DIAGNOSIS — Z171 Estrogen receptor negative status [ER-]: Secondary | ICD-10-CM

## 2014-03-20 DIAGNOSIS — C50519 Malignant neoplasm of lower-outer quadrant of unspecified female breast: Secondary | ICD-10-CM

## 2014-03-20 DIAGNOSIS — C50512 Malignant neoplasm of lower-outer quadrant of left female breast: Secondary | ICD-10-CM

## 2014-03-20 LAB — CBC WITH DIFFERENTIAL/PLATELET
BASO%: 0.6 % (ref 0.0–2.0)
BASOS ABS: 0 10*3/uL (ref 0.0–0.1)
EOS ABS: 0.1 10*3/uL (ref 0.0–0.5)
EOS%: 3.4 % (ref 0.0–7.0)
HCT: 38 % (ref 34.8–46.6)
HEMOGLOBIN: 12.2 g/dL (ref 11.6–15.9)
LYMPH#: 1 10*3/uL (ref 0.9–3.3)
LYMPH%: 55.2 % — ABNORMAL HIGH (ref 14.0–49.7)
MCH: 27.4 pg (ref 25.1–34.0)
MCHC: 32.2 g/dL (ref 31.5–36.0)
MCV: 85.3 fL (ref 79.5–101.0)
MONO#: 0.1 10*3/uL (ref 0.1–0.9)
MONO%: 6.6 % (ref 0.0–14.0)
NEUT#: 0.6 10*3/uL — ABNORMAL LOW (ref 1.5–6.5)
NEUT%: 34.2 % — ABNORMAL LOW (ref 38.4–76.8)
Platelets: 160 10*3/uL (ref 145–400)
RBC: 4.46 10*6/uL (ref 3.70–5.45)
RDW: 14 % (ref 11.2–14.5)
WBC: 1.8 10*3/uL — AB (ref 3.9–10.3)

## 2014-03-20 LAB — COMPREHENSIVE METABOLIC PANEL (CC13)
ALT: 14 U/L (ref 0–55)
AST: 12 U/L (ref 5–34)
Albumin: 3.1 g/dL — ABNORMAL LOW (ref 3.5–5.0)
Alkaline Phosphatase: 97 U/L (ref 40–150)
Anion Gap: 6 mEq/L (ref 3–11)
BUN: 14.1 mg/dL (ref 7.0–26.0)
CHLORIDE: 101 meq/L (ref 98–109)
CO2: 31 mEq/L — ABNORMAL HIGH (ref 22–29)
CREATININE: 0.9 mg/dL (ref 0.6–1.1)
Calcium: 9.8 mg/dL (ref 8.4–10.4)
GLUCOSE: 137 mg/dL (ref 70–140)
Potassium: 3.9 mEq/L (ref 3.5–5.1)
Sodium: 138 mEq/L (ref 136–145)
Total Bilirubin: 0.45 mg/dL (ref 0.20–1.20)
Total Protein: 7 g/dL (ref 6.4–8.3)

## 2014-03-20 MED ORDER — FLUCONAZOLE 100 MG PO TABS: 100.0000 mg | ORAL_TABLET | Freq: Every day | ORAL | Status: DC

## 2014-03-20 NOTE — Progress Notes (Signed)
[place]  Telephone:(336) [phone] Fax:(336) [phone]     ID: Monica [name] DOB: 1946-05-24  MR#: [id number]  VHQ#:[id number]  Patient Care Team: Maury Dus, MD as PCP - General (Family Medicine) Excell Seltzer, MD as Consulting Physician (General Surgery) Chauncey Cruel, MD as Consulting Physician (Oncology) Marye Round, MD as Consulting Physician (Radiation Oncology) Maisie Fus, MD as Consulting Physician (Obstetrics and Gynecology)   CHIEF COMPLAINT: Newly diagnosed breast cancer  CURRENT TREATMENT:  On adjuvant chemotherapy   BREAST CANCER HISTORY: From the original intake note:  [facility]ica [name] [facility] showing a possible mass in her left breast. On 01/05/2014 left diagnostic mammography and ultrasonography at the breast Center showed 2 adjacent poorly defined masses deep in the lower outer portion of the left breast. On physical exam these were palpable measuring approximately 3 cm altogether, at the 4:00 position 16 cm from the left nipple. There were no palpable left axillary lymph nodes. Ultrasound confirmed to and adjacent irregularly marginated hypoechoic masses in the area in question, measuring 1.5 and 1.0 cm respectively. The total area of by ultrasonography measured 2.6 cm. In addition, to left axillary lymph nodes showed mild cortical thickening in one of them showed loss of the normal fatty hilum.  On 01/10/2014 the patient underwent separate biopsies of both the left breast masses in question as well as the more suspicious lymph node. The final pathology ([id number]) showed the lymph node to be benign. (This was interpreted as concordant). Both of the breast masses, however, were positive for an invasive ductal carcinoma, which was HER-2 not amplified, with the signals ratio of 0.98 and the number per cell being 2.45. Estrogen and progesterone receptor determination is pending but looks negative  at this point.  On 01/16/2014 the patient underwent bilateral breast MRI the right breast was unremarkable. In the left breast lower outer quadrant there were 2 oval masses measuring 1.5 and 2.2 cm. Taken together they was approximately 4 cm of disease. There were no findings of concern in the right breast. In the left axilla there was a single mildly prominent lymph node. There was no suspicious internal mammary adenopathy.  The patient's subsequent history is as detailed below  INTERVAL HISTORY: Monica [name] returns today for followup of her breast cancer accompanied by her goddaughter [name]. Today is day 8, cycle 1 of 4 planned cycles doxorubicin and cyclophosphamide, with neulasta given on day 2 for granulocyte support, to be followed by 12 doses of weekly paclitaxel/carboplatin.[name]   REVIEW OF SYSTEMS: [name] did very well the first 3 days of treatment. On day 4, the day she stopped the Decadron in the morning, she started feeling fatigued. She had nausea, although no vomiting. The nausea persists. She also experienced some "chills". She is having hot flashes, of course, but the chills or different. They are not quite rigors, and she never had a temperature above 99.1. She did have muscle pain from the Neulasta. This is keeping her from sleeping. She did take her Claritin as instructed. She is not using the ondansetron for late onset nausea. In addition her mouth feels "horrible" and she has significantly prefer to taste. She is having perhaps 3 or 4 soft bowel movements a day, which is a change for her. There has been no blood and no black or tarry looking stools. A detailed review of systems was otherwise stable  PAST MEDICAL HISTORY: Past Medical History  Diagnosis Date  . Allergy codeine  rapid heart beat, cold sweat  . Arthritis   . Breast cancer   . Hypertension     PAST SURGICAL HISTORY: Past Surgical History  Procedure Laterality Date  . Knee arthroscopy  2012    right  . Lumbar  laminectomy  1991  . Colonoscopy    . Foot arthroplasty  1998    right  . Abdominal hysterectomy  1992  . Breast lumpectomy with needle localization and axillary sentinel lymph node bx Left 02/12/2014    Procedure: LEFT BREAST LUMPECTOMY WITH NEEDLE LOCALIZATION AND LEFT AXILLARY SENTINEL LYMPH NODE BX;  Surgeon: [name], MD;  Location: [facility];  Service: General;  Laterality: Left;  . Portacath placement Right 02/12/2014    Procedure: INSERTION PORT-A-CATH;  Surgeon: [name], MD;  Location: [facility];  Service: General;  Laterality: Right;    FAMILY HISTORY Family History  Problem Relation Age of Onset  . Cancer Sister   . Cancer Brother   . Cancer Maternal Aunt   . Cancer Paternal Aunt    the patient's father died at the age of 67 from a stroke. The patient's mother died at the age of 27 from "multiple causes". The patient had 2 brothers and 2 sisters. The patient's sister was diagnosed with uterine cancer at the age of 52, and the patient's brother [name] had "bone cancer" and died in his 11s. He was treated at the [facility]. There is no history of breast or ovarian cancer in the family to the patient's knowledge.  GYNECOLOGIC HISTORY:  No LMP recorded. Patient has had a hysterectomy. Menarche age 8, the patient is GX P0. She underwent hysterectomy in 1992, with no salpingo-oophorectomy. She has been on estrogen replacement since. 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 Her husband [name] worked for [employer] in [place] as a Facilities manager. The patient's adopted son [name] works as a laborer in [place]. The patient's god-daughter [name] is an Optometrist. The patient has no grandchildren. The patient attends a local church of  [place], and her husband [name] is pastor    ADVANCED  DIRECTIVES: In place   HEALTH MAINTENANCE: History  Substance Use Topics  . Smoking status: Never Smoker   . Smokeless tobacco: Not on file  . Alcohol Use: No     Colonoscopy: October 2008  PAP: July 2015  Bone density: 01/01/2014  Lipid panel:  Allergies  Allergen Reactions  . Codeine     Heart fast    Current Outpatient Prescriptions  Medication Sig Dispense Refill  . amLODipine (NORVASC) 5 MG tablet Take 5 mg by mouth daily.      . Cholecalciferol (VITAMIN D3) 2000 UNITS TABS Take 1 tablet by mouth daily.      [name] dexamethasone (DECADRON) 4 MG tablet Take 2 tablets by mouth once a day on the day after chemotherapy and then take 2 tablets two times a day for 2 days. Take with food.  30 tablet  1  . docusate sodium (COLACE) 100 MG capsule Take 100 mg by mouth 2 (two) times daily.      . Ginkgo Biloba 120 MG CAPS Take 1 capsule by mouth daily.      [name] HYDROcodone-acetaminophen (NORCO/VICODIN) 5-325 MG per tablet       . lidocaine-prilocaine (EMLA) cream Apply 1 application topically as needed. Apply over  port site 1-2 hours before chemo and cover with plastic wrap  30 g  0  . LORazepam (ATIVAN) 0.5 MG tablet Take 1 tablet (0.5 mg total) by mouth at bedtime as needed (Nausea or vomiting).  30 tablet  0  . meloxicam (MOBIC) 15 MG tablet Take 15 mg by mouth daily as needed for pain.      [name] nystatin-triamcinolone ointment (MYCOLOG)       . ondansetron (ZOFRAN) 8 MG tablet Take 1 tablet (8 mg total) by mouth 2 (two) times daily as needed. Start on the third day after chemotherapy.  30 tablet  1  . oxyCODONE (OXY IR/ROXICODONE) 5 MG immediate release tablet Take 1-2 tablets (5-10 mg total) by mouth every 4 (four) hours as needed for moderate pain, severe pain or breakthrough pain.  40 tablet  0  . prochlorperazine (COMPAZINE) 10 MG tablet Take 1 tablet (10 mg total) by mouth every 6 (six) hours as needed (Nausea or vomiting).  30 tablet  1  . UNABLE TO FIND Take 1 tablet by mouth daily.  OMEGA XL      . valsartan-hydrochlorothiazide (DIOVAN-HCT) 320-25 MG per tablet Take 1 tablet by mouth daily.       No current facility-administered medications for this visit.    OBJECTIVE: Middle-aged [place] American woman who appears stated age 67 Vitals:   03/20/14 0932  BP: 164/69  Pulse: 80  Temp: 99.1 F (37.3 C)  Resp: 18     Body mass index is 40.19 kg/(m^2).    ECOG FS:1 - Symptomatic but completely ambulatory  Sclerae unicteric, pupils are round and equal Oropharynx shows obvious thrush No cervical or supraclavicular adenopathy Lungs no rales or rhonchi Heart regular rate and rhythm Abd soft, nontender, positive bowel sounds MSK no focal spinal tenderness, no upper extremity lymphedema Neuro: nonfocal, well oriented, appropriate affect Breasts: Deferred    LAB RESULTS:  CMP     Component Value Date/Time   NA 139 03/12/2014 0846   NA 141 11/03/2010 1352   K 3.7 03/12/2014 0846   K 3.3* 11/03/2010 1352   CO2 26 03/12/2014 0846   GLUCOSE 101 03/12/2014 0846   GLUCOSE 83 11/03/2010 1352   BUN 15.1 03/12/2014 0846   CREATININE 0.9 03/12/2014 0846   CALCIUM 9.7 03/12/2014 0846   PROT 7.9 03/12/2014 0846   ALBUMIN 3.4* 03/12/2014 0846   AST 17 03/12/2014 0846   ALT 19 03/12/2014 0846   ALKPHOS 83 03/12/2014 0846   BILITOT 0.37 03/12/2014 0846    I No results found for this basename: SPEP,  UPEP,   kappa and lambda light chains    Lab Results  Component Value Date   WBC 1.8* 03/20/2014   NEUTROABS 0.6* 03/20/2014   HGB 12.2 03/20/2014   HCT 38.0 03/20/2014   MCV 85.3 03/20/2014   PLT 160 03/20/2014      Chemistry      Component Value Date/Time   NA 139 03/12/2014 0846   NA 141 11/03/2010 1352   K 3.7 03/12/2014 0846   K 3.3* 11/03/2010 1352   CO2 26 03/12/2014 0846   BUN 15.1 03/12/2014 0846   CREATININE 0.9 03/12/2014 0846      Component Value Date/Time   CALCIUM 9.7 03/12/2014 0846   ALKPHOS 83 03/12/2014 0846   AST 17 03/12/2014 0846   ALT 19 03/12/2014 0846    BILITOT 0.37 03/12/2014 0846       No results found for this basename: LABCA2  No components found with this basename: LABCA125    No results found for this basename: INR,  in the last 168 hours  Urinalysis No results found for this basename: colorurine,  appearanceur,  labspec,  phurine,  glucoseu,  hgbur,  bilirubinur,  ketonesur,  proteinur,  urobilinogen,  nitrite,  leukocytesur    STUDIES: No results found.  ASSESSMENT: 68 y.o. [place] woman status post left breast biopsy of 2 separate lower outer quadrant masses 01/10/2014 for a multifocal cT2 cNX, stage II invasive ductal carcinoma, grade 3, with an MIB-1 of 90%, HER-2 negative, estrogen receptors 3% positive with weak staining, progesterone receptor negative, with an MIB-1 of 90%; this is clinically a "triple-negative" breast cancer and will be treated as such  (1) a suspicious left axillary lymph node biopsied 01/10/2014 was negative (concordant)  (2) left lumpectomy and sentinel lymph node sampling 02/12/2014 showed an mpT2 pN0, stage IIA invasive ductal carcinoma grade 3, repeat HER-2 pending  (3) adjuvant chemotherapy starting 03/12/2014 to consist of dose dense doxorubicin and cyclophosphamide x4, with Neulasta support, followed by weekly paclitaxel and carboplatin x12  (4) adjuvant radiation to follow chemotherapy  PLAN: Vanette did moderately well with her first cycle but it was "rough". We want to do better with cycle 2. First of all I am putting in an order for Diflucan for her to take for the next several days until her oral thrush have completely cleared. Second I have asked her to take Zofran 1 tablet twice daily for the next 2 days or until her nausea completely results. For sleep she will try lorazepam at bedtime. We also discussed diet issues and she is going to try adding a few drops of lemon juice or vinegar to her food and see if that improves the taste.    Her neutrophil count today is 600. She is  already completing an antibiotic course prescribed by surgery. I don't think she needs additional coverage at this point. She will return October 5 for cycle 2. I am not making any changes on her chemotherapy orders but they do hope with the changes above we can get her from a "B." to an " A" with cycle 2    Rekha Hobbins C, MD   03/20/2014 9:34 AM

## 2014-03-26 ENCOUNTER — Ambulatory Visit (HOSPITAL_BASED_OUTPATIENT_CLINIC_OR_DEPARTMENT_OTHER): Payer: Medicare (Managed Care)

## 2014-03-26 ENCOUNTER — Encounter: Payer: Self-pay | Admitting: Nurse Practitioner

## 2014-03-26 ENCOUNTER — Ambulatory Visit (HOSPITAL_BASED_OUTPATIENT_CLINIC_OR_DEPARTMENT_OTHER): Payer: Medicare (Managed Care) | Admitting: Nurse Practitioner

## 2014-03-26 ENCOUNTER — Telehealth: Payer: Self-pay | Admitting: Nurse Practitioner

## 2014-03-26 ENCOUNTER — Other Ambulatory Visit (HOSPITAL_BASED_OUTPATIENT_CLINIC_OR_DEPARTMENT_OTHER): Payer: Medicare (Managed Care)

## 2014-03-26 VITALS — BP 151/71 | HR 90 | Temp 98.6°F | Resp 19 | Ht 66.0 in | Wt 248.6 lb

## 2014-03-26 DIAGNOSIS — R110 Nausea: Secondary | ICD-10-CM

## 2014-03-26 DIAGNOSIS — R12 Heartburn: Secondary | ICD-10-CM

## 2014-03-26 DIAGNOSIS — B370 Candidal stomatitis: Secondary | ICD-10-CM

## 2014-03-26 DIAGNOSIS — C50512 Malignant neoplasm of lower-outer quadrant of left female breast: Secondary | ICD-10-CM

## 2014-03-26 DIAGNOSIS — Z5111 Encounter for antineoplastic chemotherapy: Secondary | ICD-10-CM

## 2014-03-26 LAB — COMPREHENSIVE METABOLIC PANEL (CC13)
ALK PHOS: 94 U/L (ref 40–150)
ALT: 20 U/L (ref 0–55)
AST: 15 U/L (ref 5–34)
Albumin: 3.2 g/dL — ABNORMAL LOW (ref 3.5–5.0)
Anion Gap: 8 mEq/L (ref 3–11)
BUN: 11 mg/dL (ref 7.0–26.0)
CO2: 29 mEq/L (ref 22–29)
CREATININE: 0.9 mg/dL (ref 0.6–1.1)
Calcium: 9.9 mg/dL (ref 8.4–10.4)
Chloride: 104 mEq/L (ref 98–109)
Glucose: 182 mg/dl — ABNORMAL HIGH (ref 70–140)
Potassium: 3.7 mEq/L (ref 3.5–5.1)
Sodium: 140 mEq/L (ref 136–145)
Total Bilirubin: <0.2 mg/dL (ref 0.20–1.20)
Total Protein: 7.2 g/dL (ref 6.4–8.3)

## 2014-03-26 LAB — CBC WITH DIFFERENTIAL/PLATELET
BASO%: 1 % (ref 0.0–2.0)
BASOS ABS: 0.1 10*3/uL (ref 0.0–0.1)
EOS%: 0.3 % (ref 0.0–7.0)
Eosinophils Absolute: 0 10*3/uL (ref 0.0–0.5)
HCT: 37.8 % (ref 34.8–46.6)
HEMOGLOBIN: 12.2 g/dL (ref 11.6–15.9)
LYMPH#: 1.6 10*3/uL (ref 0.9–3.3)
LYMPH%: 25.5 % (ref 14.0–49.7)
MCH: 27.7 pg (ref 25.1–34.0)
MCHC: 32.3 g/dL (ref 31.5–36.0)
MCV: 85.9 fL (ref 79.5–101.0)
MONO#: 0.3 10*3/uL (ref 0.1–0.9)
MONO%: 5.2 % (ref 0.0–14.0)
NEUT#: 4.3 10*3/uL (ref 1.5–6.5)
NEUT%: 68 % (ref 38.4–76.8)
Platelets: 231 10*3/uL (ref 145–400)
RBC: 4.4 10*6/uL (ref 3.70–5.45)
RDW: 14.3 % (ref 11.2–14.5)
WBC: 6.3 10*3/uL (ref 3.9–10.3)

## 2014-03-26 MED ORDER — PALONOSETRON HCL INJECTION 0.25 MG/5ML
INTRAVENOUS | Status: AC
  Filled 2014-03-26: qty 5

## 2014-03-26 MED ORDER — HEPARIN SOD (PORK) LOCK FLUSH 100 UNIT/ML IV SOLN
500.0000 [IU] | Freq: Once | INTRAVENOUS | Status: AC | PRN
  Administered 2014-03-26: 500 [IU]
  Filled 2014-03-26: qty 5

## 2014-03-26 MED ORDER — DEXAMETHASONE SODIUM PHOSPHATE 20 MG/5ML IJ SOLN
12.0000 mg | Freq: Once | INTRAMUSCULAR | Status: AC
  Administered 2014-03-26: 12 mg via INTRAVENOUS

## 2014-03-26 MED ORDER — SODIUM CHLORIDE 0.9 % IV SOLN
Freq: Once | INTRAVENOUS | Status: AC
  Administered 2014-03-26: 10:00:00 via INTRAVENOUS

## 2014-03-26 MED ORDER — PALONOSETRON HCL INJECTION 0.25 MG/5ML
0.2500 mg | Freq: Once | INTRAVENOUS | Status: AC
  Administered 2014-03-26: 0.25 mg via INTRAVENOUS

## 2014-03-26 MED ORDER — DEXAMETHASONE SODIUM PHOSPHATE 20 MG/5ML IJ SOLN
INTRAMUSCULAR | Status: AC
  Filled 2014-03-26: qty 5

## 2014-03-26 MED ORDER — SODIUM CHLORIDE 0.9 % IV SOLN
600.0000 mg/m2 | Freq: Once | INTRAVENOUS | Status: AC
  Administered 2014-03-26: 1400 mg via INTRAVENOUS
  Filled 2014-03-26: qty 70

## 2014-03-26 MED ORDER — SODIUM CHLORIDE 0.9 % IV SOLN
150.0000 mg | Freq: Once | INTRAVENOUS | Status: AC
  Administered 2014-03-26: 150 mg via INTRAVENOUS
  Filled 2014-03-26: qty 5

## 2014-03-26 MED ORDER — SODIUM CHLORIDE 0.9 % IJ SOLN
10.0000 mL | INTRAMUSCULAR | Status: DC | PRN
  Administered 2014-03-26: 10 mL
  Filled 2014-03-26: qty 10

## 2014-03-26 MED ORDER — DOXORUBICIN HCL CHEMO IV INJECTION 2 MG/ML
60.0000 mg/m2 | Freq: Once | INTRAVENOUS | Status: AC
  Administered 2014-03-26: 140 mg via INTRAVENOUS
  Filled 2014-03-26: qty 70

## 2014-03-26 NOTE — Telephone Encounter (Signed)
, °

## 2014-03-26 NOTE — Progress Notes (Signed)
[place]  Telephone:(336) [phone] Fax:(336) [phone]     ID: Monica [name] DOB: 1946-03-17  MR#: [id number]  WLS#:[id number]  Patient Care Team: Monica Dus, MD as PCP - General (Family Medicine) Monica Seltzer, MD as Consulting Physician (General Surgery) Monica Cruel, MD as Consulting Physician (Oncology) Monica Round, MD as Consulting Physician (Radiation Oncology) Monica Fus, MD as Consulting Physician (Obstetrics and Gynecology)   CHIEF COMPLAINT: Newly diagnosed breast cancer  CURRENT TREATMENT:  On adjuvant chemotherapy   BREAST CANCER HISTORY: From the original intake note:  [name] showing a possible mass in her left breast. On 01/05/2014 left diagnostic mammography and ultrasonography at the breast Center showed 2 adjacent poorly defined masses deep in the lower outer portion of the left breast. On physical exam these were palpable measuring approximately 3 cm altogether, at the 4:00 position 16 cm from the left nipple. There were no palpable left axillary lymph nodes. Ultrasound confirmed to and adjacent irregularly marginated hypoechoic masses in the area in question, measuring 1.5 and 1.0 cm respectively. The total area of by ultrasonography measured 2.6 cm. In addition, to left axillary lymph nodes showed mild cortical thickening in one of them showed loss of the normal fatty hilum.  On 01/10/2014 the patient underwent separate biopsies of both the left breast masses in question as well as the more suspicious lymph node. The final pathology ([id number]) showed the lymph node to be benign. (This was interpreted as concordant). Both of the breast masses, however, were positive for an invasive ductal carcinoma, which was HER-2 not amplified, with the signals ratio of 0.98 and the number per cell being 2.45. Estrogen and progesterone receptor determination is pending but looks negative  at this point.  On 01/16/2014 the patient underwent bilateral breast MRI the right breast was unremarkable. In the left breast lower outer quadrant there were 2 oval masses measuring 1.5 and 2.2 cm. Taken together they was approximately 4 cm of disease. There were no findings of concern in the right breast. In the left axilla there was a single mildly prominent lymph node. There was no suspicious internal mammary adenopathy.  The patient's subsequent history is as detailed below  INTERVAL HISTORY: Monica [name] returns today for followup of her breast cancer accompanied by her niece. Today is day 1, cycle 2 of 4 planned cycles doxorubicin and cyclophosphamide, with neulasta given on day 2 for granulocyte support.  REVIEW OF SYSTEMS: Monica [name] is feeling better this week. She has no pain, fevers, chills, nausea, or vomiting currently. She had some diarrhea on day 7, but she stopped her stool softeners and this has resolved. She continues to have heartburn daily, but takes some baking soda mixed with water to relieve the acid. She continues to experience thrush, but still has a few days of her diflucan left. Her appetite is fine, but her sense of taste changed acutely after chemo. Her fatigue is manageable, however she has difficulty sleeping and has not tried the lorazepam yet. She has no shortness of breath, chest pain, cough, or fatigue.   PAST MEDICAL HISTORY: Past Medical History  Diagnosis Date  . Allergy codeine    rapid heart beat, cold sweat  . Arthritis   . Breast cancer   . Hypertension     PAST SURGICAL HISTORY: Past Surgical History  Procedure Laterality Date  . Knee arthroscopy  2012    right  . Lumbar laminectomy  1991  .  Colonoscopy    . Foot arthroplasty  1998    right  . Abdominal hysterectomy  1992  . Breast lumpectomy with needle localization and axillary sentinel lymph node bx Left 02/12/2014    Procedure: LEFT BREAST LUMPECTOMY WITH NEEDLE LOCALIZATION AND LEFT AXILLARY  SENTINEL LYMPH NODE BX;  Surgeon: Monica Jolly, MD;  Location: [facility];  Service: General;  Laterality: Left;  . Portacath placement Right 02/12/2014    Procedure: INSERTION PORT-A-CATH;  Surgeon: Monica Jolly, MD;  Location: [facility];  Service: General;  Laterality: Right;    FAMILY HISTORY Family History  Problem Relation Age of Onset  . Cancer Sister   . Cancer Brother   . Cancer Maternal Aunt   . Cancer Paternal Aunt    the patient's father died at the age of 58 from a stroke. The patient's mother died at the age of 33 from "multiple causes". The patient had 2 brothers and 2 sisters. The patient's sister was diagnosed with uterine cancer at the age of 33, and the patient's brother Monica [name] had "bone cancer" and died in his 66s. He was treated at the [facility]. There is no history of breast or ovarian cancer in the family to the patient's knowledge.  GYNECOLOGIC HISTORY:  No LMP recorded. Patient has had a hysterectomy. Menarche age 10, the patient is GX P0. She underwent hysterectomy in 1992, with no salpingo-oophorectomy. She has been on estrogen replacement since. 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 Her husband Monica [name] worked for [employer] in [place] as a Facilities manager. The patient's adopted son Monica [name] works as a laborer in [place]. The patient's god-daughter Monica [name] is an Optometrist. The patient has no grandchildren. The patient attends a local church of  [place], and her husband Monica [name] is pastor    ADVANCED DIRECTIVES: In place   HEALTH MAINTENANCE: History  Substance Use Topics  . Smoking status: Never Smoker   . Smokeless tobacco: Not on file  . Alcohol Use: No     Colonoscopy: October 2008  PAP: July 2015  Bone density: 01/01/2014  Lipid panel:  Allergies  Allergen  Reactions  . Codeine     Heart fast    Current Outpatient Prescriptions  Medication Sig Dispense Refill  . amLODipine (NORVASC) 5 MG tablet Take 5 mg by mouth daily.      . Cholecalciferol (VITAMIN D3) 2000 UNITS TABS Take 1 tablet by mouth daily.      [name] dexamethasone (DECADRON) 4 MG tablet Take 2 tablets by mouth once a day on the day after chemotherapy and then take 2 tablets two times a day for 2 days. Take with food.  30 tablet  1  . docusate sodium (COLACE) 100 MG capsule Take 100 mg by mouth 2 (two) times daily.      . fluconazole (DIFLUCAN) 100 MG tablet Take 1 tablet (100 mg total) by mouth daily.  10 tablet  0  . Ginkgo Biloba 120 MG CAPS Take 1 capsule by mouth daily.      [name] HYDROcodone-acetaminophen (NORCO/VICODIN) 5-325 MG per tablet       . lidocaine-prilocaine (EMLA) cream Apply 1 application topically as needed. Apply over port site 1-2 hours before chemo and cover with plastic wrap  30 g  0  . LORazepam (ATIVAN) 0.5 MG tablet Take 1 tablet (0.5 mg  total) by mouth at bedtime as needed (Nausea or vomiting).  30 tablet  0  . meloxicam (MOBIC) 15 MG tablet Take 15 mg by mouth daily as needed for pain.      [name] nystatin-triamcinolone ointment (MYCOLOG)       . ondansetron (ZOFRAN) 8 MG tablet Take 1 tablet (8 mg total) by mouth 2 (two) times daily as needed. Start on the third day after chemotherapy.  30 tablet  1  . oxyCODONE (OXY IR/ROXICODONE) 5 MG immediate release tablet Take 1-2 tablets (5-10 mg total) by mouth every 4 (four) hours as needed for moderate pain, severe pain or breakthrough pain.  40 tablet  0  . prochlorperazine (COMPAZINE) 10 MG tablet Take 1 tablet (10 mg total) by mouth every 6 (six) hours as needed (Nausea or vomiting).  30 tablet  1  . UNABLE TO FIND Take 1 tablet by mouth daily. OMEGA XL      . valsartan-hydrochlorothiazide (DIOVAN-HCT) 320-25 MG per tablet Take 1 tablet by mouth daily.       No current facility-administered medications for this visit.     OBJECTIVE: Middle-aged [place] American woman who appears stated age 68 Vitals:   03/26/14 0852  BP: 151/71  Pulse: 90  Temp: 98.6 F (37 C)  Resp: 19     Body mass index is 40.14 kg/(m^2).    ECOG FS:1 - Symptomatic but completely ambulatory  Skin: warm, dry  HEENT: sclerae anicteric, conjunctivae pink. Mild thrush on back of tongue Lymph Nodes: No cervical or supraclavicular lymphadenopathy  Lungs: clear to auscultation bilaterally, no rales, wheezes, or rhonci  Heart: regular rate and rhythm  Abdomen: [name], soft, non tender, positive bowel sounds  Musculoskeletal: No focal spinal tenderness, no peripheral edema  Neuro: non focal, well oriented, positive affect  Breasts: deferred  LAB RESULTS:  CMP     Component Value Date/Time   NA 138 03/20/2014 0906   NA 141 11/03/2010 1352   K 3.9 03/20/2014 0906   K 3.3* 11/03/2010 1352   CO2 31* 03/20/2014 0906   GLUCOSE 137 03/20/2014 0906   GLUCOSE 83 11/03/2010 1352   BUN 14.1 03/20/2014 0906   CREATININE 0.9 03/20/2014 0906   CALCIUM 9.8 03/20/2014 0906   PROT 7.0 03/20/2014 0906   ALBUMIN 3.1* 03/20/2014 0906   AST 12 03/20/2014 0906   ALT 14 03/20/2014 0906   ALKPHOS 97 03/20/2014 0906   BILITOT 0.45 03/20/2014 0906    I No results found for this basename: SPEP,  UPEP,   kappa and lambda light chains    Lab Results  Component Value Date   WBC 6.3 03/26/2014   NEUTROABS 4.3 03/26/2014   HGB 12.2 03/26/2014   HCT 37.8 03/26/2014   MCV 85.9 03/26/2014   PLT 231 03/26/2014      Chemistry      Component Value Date/Time   NA 138 03/20/2014 0906   NA 141 11/03/2010 1352   K 3.9 03/20/2014 0906   K 3.3* 11/03/2010 1352   CO2 31* 03/20/2014 0906   BUN 14.1 03/20/2014 0906   CREATININE 0.9 03/20/2014 0906      Component Value Date/Time   CALCIUM 9.8 03/20/2014 0906   ALKPHOS 97 03/20/2014 0906   AST 12 03/20/2014 0906   ALT 14 03/20/2014 0906   BILITOT 0.45 03/20/2014 0906       No results found for this basename: LABCA2     No components found with this basename: [id number]  No results found for this basename: INR,  in the last 168 hours  Urinalysis No results found for this basename: colorurine,  appearanceur,  labspec,  phurine,  glucoseu,  hgbur,  bilirubinur,  ketonesur,  proteinur,  urobilinogen,  nitrite,  leukocytesur    STUDIES: No results found.  ASSESSMENT: 68 y.o. [place] woman status post left breast biopsy of 2 separate lower outer quadrant masses 01/10/2014 for a multifocal cT2 cNX, stage II invasive ductal carcinoma, grade 3, with an MIB-1 of 90%, HER-2 negative, estrogen receptors 3% positive with weak staining, progesterone receptor negative, with an MIB-1 of 90%; this is clinically a "triple-negative" breast cancer and will be treated as such  (1) a suspicious left axillary lymph node biopsied 01/10/2014 was negative (concordant)  (2) left lumpectomy and sentinel lymph node sampling 02/12/2014 showed an mpT2 pN0, stage IIA invasive ductal carcinoma grade 3, repeat HER-2 pending  (3) adjuvant chemotherapy starting 03/12/2014 to consist of dose dense doxorubicin and cyclophosphamide x4, with Neulasta support, followed by weekly paclitaxel and carboplatin x12  (4) adjuvant radiation to follow chemotherapy  PLAN: The labs were reviewed in detail and Monica [name]'s ANC has rebounded nicely. She will extend her anti-emetic schedule to using ondansetron BID take care of her delayed nausea. She will continue the diflucan to get rid of her thrush. For sleep she will try lorazepam at bedtime.   Monica [name] will proceed with day 1, cycle 2 of AC today. She will return next week for her nadir visit. She understands and agrees with the plan. She knows the goal of treatment in her case is cure. She has been encouraged to call with any issues that might arise before her next visit here.    Marcelino Duster, NP   03/26/2014 9:12 AM

## 2014-03-26 NOTE — Patient Instructions (Signed)
[facility] Discharge Instructions for Patients Receiving Chemotherapy  Today you received the following chemotherapy agents:  Adriamycin and Cytoxan  To help prevent nausea and vomiting after your treatment, we encourage you to take your nausea medication as ordered per MD.   If you develop nausea and vomiting that is not controlled by your nausea medication, call the clinic.   BELOW ARE SYMPTOMS THAT SHOULD BE REPORTED IMMEDIATELY:  *FEVER GREATER THAN 100.5 F  *CHILLS WITH OR WITHOUT FEVER  NAUSEA AND VOMITING THAT IS NOT CONTROLLED WITH YOUR NAUSEA MEDICATION  *UNUSUAL SHORTNESS OF BREATH  *UNUSUAL BRUISING OR BLEEDING  TENDERNESS IN MOUTH AND THROAT WITH OR WITHOUT PRESENCE OF ULCERS  *URINARY PROBLEMS  *BOWEL PROBLEMS  UNUSUAL RASH Items with * indicate a potential emergency and should be followed up as soon as possible.  Feel free to call the clinic you have any questions or concerns. The clinic phone number is [phone].    

## 2014-03-27 ENCOUNTER — Ambulatory Visit (HOSPITAL_BASED_OUTPATIENT_CLINIC_OR_DEPARTMENT_OTHER): Payer: Medicare (Managed Care)

## 2014-03-27 VITALS — BP 154/73 | HR 84 | Temp 98.5°F

## 2014-03-27 DIAGNOSIS — Z5189 Encounter for other specified aftercare: Secondary | ICD-10-CM

## 2014-03-27 DIAGNOSIS — C50512 Malignant neoplasm of lower-outer quadrant of left female breast: Secondary | ICD-10-CM

## 2014-03-27 MED ORDER — PEGFILGRASTIM INJECTION 6 MG/0.6ML
6.0000 mg | Freq: Once | SUBCUTANEOUS | Status: AC
  Administered 2014-03-27: 6 mg via SUBCUTANEOUS
  Filled 2014-03-27: qty 0.6

## 2014-04-02 ENCOUNTER — Encounter: Payer: Self-pay | Admitting: Adult Health

## 2014-04-02 ENCOUNTER — Other Ambulatory Visit: Payer: Self-pay

## 2014-04-02 ENCOUNTER — Ambulatory Visit (HOSPITAL_BASED_OUTPATIENT_CLINIC_OR_DEPARTMENT_OTHER): Payer: MEDICARE | Admitting: Adult Health

## 2014-04-02 ENCOUNTER — Other Ambulatory Visit: Payer: Self-pay | Admitting: *Deleted

## 2014-04-02 ENCOUNTER — Other Ambulatory Visit (HOSPITAL_BASED_OUTPATIENT_CLINIC_OR_DEPARTMENT_OTHER): Payer: Medicare (Managed Care)

## 2014-04-02 ENCOUNTER — Telehealth: Payer: Self-pay | Admitting: *Deleted

## 2014-04-02 ENCOUNTER — Telehealth: Payer: Self-pay | Admitting: Adult Health

## 2014-04-02 VITALS — BP 134/66 | HR 89 | Temp 89.0°F | Resp 20 | Ht 66.0 in | Wt 249.4 lb

## 2014-04-02 DIAGNOSIS — C50512 Malignant neoplasm of lower-outer quadrant of left female breast: Secondary | ICD-10-CM

## 2014-04-02 DIAGNOSIS — K1230 Oral mucositis (ulcerative), unspecified: Secondary | ICD-10-CM

## 2014-04-02 DIAGNOSIS — G4700 Insomnia, unspecified: Secondary | ICD-10-CM

## 2014-04-02 DIAGNOSIS — R002 Palpitations: Secondary | ICD-10-CM

## 2014-04-02 LAB — COMPREHENSIVE METABOLIC PANEL (CC13)
ALT: 20 U/L (ref 0–55)
AST: 11 U/L (ref 5–34)
Albumin: 3.1 g/dL — ABNORMAL LOW (ref 3.5–5.0)
Alkaline Phosphatase: 120 U/L (ref 40–150)
Anion Gap: 8 mEq/L (ref 3–11)
BILIRUBIN TOTAL: 0.42 mg/dL (ref 0.20–1.20)
BUN: 20.1 mg/dL (ref 7.0–26.0)
CALCIUM: 9.6 mg/dL (ref 8.4–10.4)
CO2: 30 mEq/L — ABNORMAL HIGH (ref 22–29)
CREATININE: 0.9 mg/dL (ref 0.6–1.1)
Chloride: 101 mEq/L (ref 98–109)
Glucose: 143 mg/dl — ABNORMAL HIGH (ref 70–140)
Potassium: 3.7 mEq/L (ref 3.5–5.1)
Sodium: 138 mEq/L (ref 136–145)
Total Protein: 6.6 g/dL (ref 6.4–8.3)

## 2014-04-02 LAB — CBC WITH DIFFERENTIAL/PLATELET
BASO%: 0.9 % (ref 0.0–2.0)
Basophils Absolute: 0 10*3/uL (ref 0.0–0.1)
EOS%: 0.7 % (ref 0.0–7.0)
Eosinophils Absolute: 0 10*3/uL (ref 0.0–0.5)
HEMATOCRIT: 38.3 % (ref 34.8–46.6)
HEMOGLOBIN: 12.5 g/dL (ref 11.6–15.9)
LYMPH%: 33.2 % (ref 14.0–49.7)
MCH: 27.8 pg (ref 25.1–34.0)
MCHC: 32.7 g/dL (ref 31.5–36.0)
MCV: 85.2 fL (ref 79.5–101.0)
MONO#: 0.2 10*3/uL (ref 0.1–0.9)
MONO%: 7.1 % (ref 0.0–14.0)
NEUT#: 1.5 10*3/uL (ref 1.5–6.5)
NEUT%: 58.1 % (ref 38.4–76.8)
PLATELETS: 300 10*3/uL (ref 145–400)
RBC: 4.49 10*6/uL (ref 3.70–5.45)
RDW: 14.2 % (ref 11.2–14.5)
WBC: 2.6 10*3/uL — ABNORMAL LOW (ref 3.9–10.3)
lymph#: 0.9 10*3/uL (ref 0.9–3.3)

## 2014-04-02 MED ORDER — FIRST-DUKES MOUTHWASH MT SUSP: OROMUCOSAL | Status: DC

## 2014-04-02 MED ORDER — PROCHLORPERAZINE MALEATE 10 MG PO TABS: 10.0000 mg | ORAL_TABLET | Freq: Four times a day (QID) | ORAL | Status: DC | PRN

## 2014-04-02 NOTE — Progress Notes (Signed)
Monica [name]  [phone] [phone]     ID: Monica [name] DOB: 1946/02/21  MR#: [id number]  FAO#:[id number]  Patient Care Team: Monica Dus, MD as PCP - General (Family Medicine) Monica Seltzer, MD as Consulting Physician (General Surgery) Monica Cruel, MD as Consulting Physician (Oncology) Monica Round, MD as Consulting Physician (Radiation Oncology) Monica Fus, MD as Consulting Physician (Obstetrics and Gynecology)   CHIEF COMPLAINT: Newly diagnosed breast cancer  CURRENT TREATMENT:  On adjuvant chemotherapy   BREAST CANCER HISTORY: From the original intake note:  [name] showing a possible mass in her left breast. On 01/05/2014 left diagnostic mammography and ultrasonography at the breast Center showed 2 adjacent poorly defined masses deep in the lower outer portion of the left breast. On physical exam these were palpable measuring approximately 3 cm altogether, at the 4:00 position 16 cm from the left nipple. There were no palpable left axillary lymph nodes. Ultrasound confirmed to and adjacent irregularly marginated hypoechoic masses in the area in question, measuring 1.5 and 1.0 cm respectively. The total area of by ultrasonography measured 2.6 cm. In addition, to left axillary lymph nodes showed mild cortical thickening in one of them showed loss of the normal fatty hilum.  On 01/10/2014 the patient underwent separate biopsies of both the left breast masses in question as well as the more suspicious lymph node. The final pathology ([id number]) showed the lymph node to be benign. (This was interpreted as concordant). Both of the breast masses, however, were positive for an invasive ductal carcinoma, which was HER-2 not amplified, with the signals ratio of 0.98 and the number per cell being 2.45. Estrogen and progesterone receptor determination is pending but looks negative  at this point.  On 01/16/2014 the patient underwent bilateral breast MRI the right breast was unremarkable. In the left breast lower outer quadrant there were 2 oval masses measuring 1.5 and 2.2 cm. Taken together they was approximately 4 cm of disease. There were no findings of concern in the right breast. In the left axilla there was a single mildly prominent lymph node. There was no suspicious internal mammary adenopathy.  The patient's subsequent history is as detailed below  INTERVAL HISTORY: Monica [name] returns today for followup of her breast cancer accompanied by her god-daughter, Monica [name]. Today is day 8, cycle 2 of 4 planned cycles doxorubicin and cyclophosphamide, with neulasta given on day 2 for granulocyte support.  She tolerated chemotherapy moderately well.  She experienced extreme hot flashes after chemotherapy last week.  She developed bone pain on Tuesday of last week, this resolved, she did not take anything for this pain.  On Wednesday of last week, she began to experience chest fluttering.  This was accompanied by shortness of breath.  She did experience chest discomfort when this happened.  She was drinking "pretty good" with water.  She drinks 32 ounces BID, and drinks other liquids in between.  She did drink two cokes on her birthday, however doesn't drink excessive caffeine. She took Dexamethasone for two extra days following chemotherapy due to severe nausea she experienced after the first cycle.  She is also concerned about not sleeping.  This started on Thursday evening.  She is waking up in the middle of the night and is unable to go back to sleep.  She tried taking Hydrocodone which didn't help.  She also tried taking Lorazepam which didn't help.  Her mouth is also concerning.  She is taking Fluconazole 173m daily and it isn't helping.  Her tongue is becoming black and her nails are hyperpigmented.    REVIEW OF SYSTEMS: A 10 point review of systems was conducted and is  otherwise negative except for what is noted above.     PAST MEDICAL HISTORY: Past Medical History  Diagnosis Date  . Allergy codeine    rapid heart beat, cold sweat  . Arthritis   . Breast cancer   . Hypertension     PAST SURGICAL HISTORY: Past Surgical History  Procedure Laterality Date  . Knee arthroscopy  2012    right  . Lumbar laminectomy  1991  . Colonoscopy    . Foot arthroplasty  1998    right  . Abdominal hysterectomy  1992  . Breast lumpectomy with needle localization and axillary sentinel lymph node bx Left 02/12/2014    Procedure: LEFT BREAST LUMPECTOMY WITH NEEDLE LOCALIZATION AND LEFT AXILLARY SENTINEL LYMPH NODE BX;  Surgeon: BEdward Jolly MD;  [place] [facility]  Service: General;  Laterality: Left;  . Portacath placement Right 02/12/2014    Procedure: INSERTION PORT-A-CATH;  Surgeon: BEdward Jolly MD;  [place] [facility]  Service: General;  Laterality: Right;    FAMILY HISTORY Family History  Problem Relation Age of Onset  . Cancer Sister   . Cancer Brother   . Cancer Maternal Aunt   . Cancer Paternal Aunt    the patient's father died at the age of 855from a stroke. The patient's mother died at the age of 773from "multiple causes". The patient had 2 brothers and 2 sisters. The patient's sister was diagnosed with uterine cancer at the age of 751 and the patient's brother Monica [name] "bone cancer" and died in his 770s He [facility] There is no history of breast or ovarian cancer in the family to the patient's knowledge.  GYNECOLOGIC HISTORY:  No LMP recorded. Patient has had a hysterectomy. Menarche age 68 the patient is GX P0. She underwent hysterectomy in 1992, with no salpingo-oophorectomy. She has been on estrogen replacement since. 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 Her husband CJuanda Crumbleworked for [employer] a [place]ilities manager The patient's adopted son JKalman [name]"MRonalee [name] GRiki Ruskworks as a laborer [place] The patient's god-daughter CRosine Abeis an aOptometrist The patient has no grandchildren. The patient attends a local church of  [place] and her husband CJuanda Crumbleis pastor   ADVANCED DIRECTIVES: In place   HEALTH MAINTENANCE: History  Substance Use Topics  . Smoking status: Never Smoker   . Smokeless tobacco: Not on file  . Alcohol Use: No     Colonoscopy: October 2008  PAP: July 2015  Bone density: 01/01/2014  Lipid panel:  Allergies  Allergen Reactions  . Codeine     Heart fast    Current Outpatient Prescriptions  Medication Sig Dispense Refill  . amLODipine (NORVASC) 5 MG tablet Take 5 mg by mouth daily.      . Cholecalciferol (VITAMIN D3) 2000 UNITS TABS Take 1 tablet by mouth daily.      .Marland Kitchendexamethasone (DECADRON) 4 MG tablet Take 2 tablets by mouth once a day on the day after chemotherapy and then take 2 tablets two times a day for 2 days. Take with food.  30 tablet  1  .  docusate sodium (COLACE) 100 MG capsule Take 100 mg by mouth 2 (two) times daily.      . fluconazole (DIFLUCAN) 100 MG tablet Take 1 tablet (100 mg total) by mouth daily.  10 tablet  0  . Ginkgo Biloba 120 MG CAPS Take 1 capsule by mouth daily.      [name] HYDROcodone-acetaminophen (NORCO/VICODIN) 5-325 MG per tablet       . lidocaine-prilocaine (EMLA) cream Apply 1 application topically as needed. Apply over port site 1-2 hours before chemo and cover with plastic wrap  30 g  0  . LORazepam (ATIVAN) 0.5 MG tablet Take 1 tablet (0.5 mg total) by mouth at bedtime as needed (Nausea or vomiting).  30 tablet  0  . meloxicam (MOBIC) 15 MG tablet Take 15 mg by mouth daily as needed for pain.      [name] nystatin-triamcinolone ointment (MYCOLOG)       . ondansetron (ZOFRAN) 8 MG tablet Take 1 tablet (8 mg total) by mouth 2 (two) times daily as  needed. Start on the third day after chemotherapy.  30 tablet  1  . oxyCODONE (OXY IR/ROXICODONE) 5 MG immediate release tablet Take 1-2 tablets (5-10 mg total) by mouth every 4 (four) hours as needed for moderate pain, severe pain or breakthrough pain.  40 tablet  0  . prochlorperazine (COMPAZINE) 10 MG tablet Take 1 tablet (10 mg total) by mouth every 6 (six) hours as needed (Nausea or vomiting).  30 tablet  1  . UNABLE TO FIND Take 1 tablet by mouth daily. OMEGA XL      . valsartan-hydrochlorothiazide (DIOVAN-HCT) 320-25 MG per tablet Take 1 tablet by mouth daily.       No current facility-administered medications for this visit.    OBJECTIVE: Middle-aged [place] American woman who appears stated age 68 Vitals:   04/02/14 0925  BP: 134/66  Pulse: 89  Temp: 89 F (31.7 C)  Resp: 20     Body mass index is 40.27 kg/(m^2).    ECOG FS:1 - Symptomatic but completely ambulatory  GENERAL: Patient is a well appearing female in no acute distress HEENT:  Sclerae anicteric.  Oropharynx clear and moist. No thrush noted, mild breakdown in upper palate. Neck is supple.  NODES:  No cervical, supraclavicular, or axillary lymphadenopathy palpated.  BREAST EXAM:  Deferred. LUNGS:  Clear to auscultation bilaterally.  No wheezes or rhonchi. HEART:  Regular rate and rhythm. No murmur appreciated. ABDOMEN:  Soft, nontender.  Positive, normoactive bowel sounds. No organomegaly palpated. MSK:  No focal spinal tenderness to palpation. Full range of motion bilaterally in the upper extremities. EXTREMITIES:  No peripheral edema.   SKIN:  Clear with no obvious rashes or skin changes. No nail dyscrasia. NEURO:  Nonfocal. Well oriented.  Appropriate affect.   LAB RESULTS:  CMP     Component Value Date/Time   NA 138 04/02/2014 0915   NA 141 11/03/2010 1352   K 3.7 04/02/2014 0915   K 3.3* 11/03/2010 1352   CO2 30* 04/02/2014 0915   GLUCOSE 143* 04/02/2014 0915   GLUCOSE 83 11/03/2010 1352   BUN 20.1  04/02/2014 0915   CREATININE 0.9 04/02/2014 0915   CALCIUM 9.6 04/02/2014 0915   PROT 6.6 04/02/2014 0915   ALBUMIN 3.1* 04/02/2014 0915   AST 11 04/02/2014 0915   ALT 20 04/02/2014 0915   ALKPHOS 120 04/02/2014 0915   BILITOT 0.42 04/02/2014 0915    I No results found for this basename: SPEP,  UPEP,   kappa and lambda light chains    Lab Results  Component Value Date   WBC 2.6* 04/02/2014   NEUTROABS 1.5 04/02/2014   HGB 12.5 04/02/2014   HCT 38.3 04/02/2014   MCV 85.2 04/02/2014   PLT 300 04/02/2014      Chemistry      Component Value Date/Time   NA 138 04/02/2014 0915   NA 141 11/03/2010 1352   K 3.7 04/02/2014 0915   K 3.3* 11/03/2010 1352   CO2 30* 04/02/2014 0915   BUN 20.1 04/02/2014 0915   CREATININE 0.9 04/02/2014 0915      Component Value Date/Time   CALCIUM 9.6 04/02/2014 0915   ALKPHOS 120 04/02/2014 0915   AST 11 04/02/2014 0915   ALT 20 04/02/2014 0915   BILITOT 0.42 04/02/2014 0915       No results found for this basename: LABCA2    No components found with this basename: LABCA125    No results found for this basename: INR,  in the last 168 hours  Urinalysis No results found for this basename: colorurine,  appearanceur,  labspec,  phurine,  glucoseu,  hgbur,  bilirubinur,  ketonesur,  proteinur,  urobilinogen,  nitrite,  leukocytesur    STUDIES: No results found.  ASSESSMENT: 68 y.o. [place] woman status post left breast biopsy of 2 separate lower outer quadrant masses 01/10/2014 for a multifocal cT2 cNX, stage II invasive ductal carcinoma, grade 3, with an MIB-1 of 90%, HER-2 negative, estrogen receptors 3% positive with weak staining, progesterone receptor negative, with an MIB-1 of 90%; this is clinically a "triple-negative" breast cancer and will be treated as such  (1) a suspicious left axillary lymph node biopsied 01/10/2014 was negative (concordant)  (2) left lumpectomy and sentinel lymph node sampling 02/12/2014 showed an mpT2  pN0, stage IIA invasive ductal carcinoma grade 3, repeat HER-2 pending  (3) adjuvant chemotherapy starting 03/12/2014 to consist of dose dense doxorubicin and cyclophosphamide x4, with Neulasta support, followed by weekly paclitaxel and carboplatin x12  (4) adjuvant radiation to follow chemotherapy  PLAN: Jeffrey is doing moderately well following chemotherapy.  Her nausea is slightly better this time.  I reviewed her lab work with her in detail.  She is not neutropenic, or anemic due to chemotherapy.    Insomnia: This is likely due to stress and the Dexamethasone.  We discussed short walks a couple of times per day in addition to good sleep habits such as avoiding caffeine and TV at bed time.  She will take Lorazepam prior to sleep.    Nail dyscrasia: I recommended she apply tea tree oil to her nails BID.    Palpitations: EKG in office.  NSR, no abnormalities noted.  She does not appear dehydrated today.  She is taking a lot of anti-emetics and Dexamethasone which could possibly be the cause.  It could also be an element of stress and anxiety.  Due to her receiving Adriamycin, we will send her to cardiology to evaluate further.    Mucositis:  She has no thrush noted on exam.  She does have some mild mucosal and gum breakdown in her upper palate.  I prescribed Magic Mouthwash 54m swish and spit four times per day.  [place]    She understands and agrees with the plan. She knows the goal of treatment in her case is cure. She has been encouraged to call with any issues that might arise before her next visit here.  I spent 25 minutes counseling the patient face to face.  The total time spent in the appointment was 30 minutes.    Minette Headland, [place] [phone] 04/02/2014 10:27 AM

## 2014-04-02 NOTE — Patient Instructions (Signed)
Insomnia Insomnia is frequent trouble falling and/or staying asleep. Insomnia can be a long term problem or a short term problem. Both are common. Insomnia can be a short term problem when the wakefulness is related to a certain stress or worry. Long term insomnia is often related to ongoing stress during waking hours and/or poor sleeping habits. Overtime, sleep deprivation itself can make the problem worse. Every little thing feels more severe because you are overtired and your ability to cope is decreased. CAUSES   Stress, anxiety, and depression.  Poor sleeping habits.  Distractions such as TV in the bedroom.  Naps close to bedtime.  Engaging in emotionally charged conversations before bed.  Technical reading before sleep.  Alcohol and other sedatives. They may make the problem worse. They can hurt normal sleep patterns and normal dream activity.  Stimulants such as caffeine for several hours prior to bedtime.  Pain syndromes and shortness of breath can cause insomnia.  Exercise late at night.  Changing time zones may cause sleeping problems (jet lag). It is sometimes helpful to have someone observe your sleeping patterns. They should look for periods of not breathing during the night (sleep apnea). They should also look to see how long those periods last. If you live alone or observers are uncertain, you can also be observed at a sleep clinic where your sleep patterns will be professionally monitored. Sleep apnea requires a checkup and treatment. Give your caregivers your medical history. Give your caregivers observations your family has made about your sleep.  SYMPTOMS   Not feeling rested in the morning.  Anxiety and restlessness at bedtime.  Difficulty falling and staying asleep. TREATMENT   Your caregiver may prescribe treatment for an underlying medical disorders. Your caregiver can give advice or help if you are using alcohol or other drugs for self-medication. Treatment  of underlying problems will usually eliminate insomnia problems.  Medications can be prescribed for short time use. They are generally not recommended for lengthy use.  Over-the-counter sleep medicines are not recommended for lengthy use. They can be habit forming.  You can promote easier sleeping by making lifestyle changes such as:  Using relaxation techniques that help with breathing and reduce muscle tension.  Exercising earlier in the day.  Changing your diet and the time of your last meal. No night time snacks.  Establish a regular time to go to bed.  Counseling can help with stressful problems and worry.  Soothing music and white noise may be helpful if there are background noises you cannot remove.  Stop tedious detailed work at least one hour before bedtime. HOME CARE INSTRUCTIONS   Keep a diary. Inform your caregiver about your progress. This includes any medication side effects. See your caregiver regularly. Take note of:  Times when you are asleep.  Times when you are awake during the night.  The quality of your sleep.  How you feel the next day. This information will help your caregiver care for you.  Get out of bed if you are still awake after 15 minutes. Read or do some quiet activity. Keep the lights down. Wait until you feel sleepy and go back to bed.  Keep regular sleeping and waking hours. Avoid naps.  Exercise regularly.  Avoid distractions at bedtime. Distractions include watching television or engaging in any intense or detailed activity like attempting to balance the household checkbook.  Develop a bedtime ritual. Keep a familiar routine of bathing, brushing your teeth, climbing into bed at the same   time each night, listening to soothing music. Routines increase the success of falling to sleep faster.  Use relaxation techniques. This can be using breathing and muscle tension release routines. It can also include visualizing peaceful scenes. You can  also help control troubling or intruding thoughts by keeping your mind occupied with boring or repetitive thoughts like the old concept of counting sheep. You can make it more creative like imagining planting one beautiful flower after another in your backyard garden.  During your day, work to eliminate stress. When this is not possible use some of the previous suggestions to help reduce the anxiety that accompanies stressful situations. MAKE SURE YOU:   Understand these instructions.  Will watch your condition.  Will get help right away if you are not doing well or get worse. Document Released: 06/05/2000 Document Revised: 08/31/2011 Document Reviewed: 07/06/2007 ExitCare Patient Information 2015 [employer]. This information is not intended to replace advice given to you by your health care provider. Make sure you discuss any questions you have with your health care provider.  

## 2014-04-02 NOTE — Telephone Encounter (Signed)
per pof to sch pt appt-gave pt copy of sch °

## 2014-04-02 NOTE — Telephone Encounter (Signed)
Called to inform pt of [facility] Pt will have an echo@1 :00p and will see Dr. Aundra Dubin @2 :20p. Appt was confirmed with Nira Conn, RN at [facility]. Pt's goddaughter whom accompanied pt at appt this morning in our office verbalized understanding about this appt and said she will let pt know. Message to be forwarded to [place].

## 2014-04-03 ENCOUNTER — Other Ambulatory Visit (HOSPITAL_COMMUNITY): Payer: Self-pay | Admitting: Vascular Surgery

## 2014-04-03 ENCOUNTER — Ambulatory Visit (HOSPITAL_COMMUNITY)
Admission: RE | Admit: 2014-04-03 | Discharge: 2014-04-03 | Disposition: A | Discharge status: Home / Self Care | Payer: MEDICARE | Source: Ambulatory Visit | Attending: Oncology | Admitting: Oncology

## 2014-04-03 ENCOUNTER — Ambulatory Visit (HOSPITAL_BASED_OUTPATIENT_CLINIC_OR_DEPARTMENT_OTHER)
Admission: RE | Admit: 2014-04-03 | Discharge: 2014-04-03 | Disposition: A | Discharge status: Home / Self Care | Payer: MEDICARE | Source: Ambulatory Visit | Attending: Cardiology | Admitting: Cardiology

## 2014-04-03 VITALS — BP 116/64 | HR 87 | Ht 66.0 in | Wt 251.8 lb

## 2014-04-03 DIAGNOSIS — C50912 Malignant neoplasm of unspecified site of left female breast: Secondary | ICD-10-CM | POA: Diagnosis not present

## 2014-04-03 DIAGNOSIS — R002 Palpitations: Secondary | ICD-10-CM

## 2014-04-03 DIAGNOSIS — I5189 Other ill-defined heart diseases: Secondary | ICD-10-CM

## 2014-04-03 DIAGNOSIS — C50512 Malignant neoplasm of lower-outer quadrant of left female breast: Secondary | ICD-10-CM

## 2014-04-03 DIAGNOSIS — I10 Essential (primary) hypertension: Secondary | ICD-10-CM | POA: Insufficient documentation

## 2014-04-03 DIAGNOSIS — I517 Cardiomegaly: Secondary | ICD-10-CM

## 2014-04-03 HISTORY — DX: Cardiomegaly: I51.7

## 2014-04-03 HISTORY — DX: Other ill-defined heart diseases: I51.89

## 2014-04-03 NOTE — Progress Notes (Signed)
  Echocardiogram 2D Echocardiogram has been performed.  Donata Clay 04/03/2014, 2:41 PM

## 2014-04-03 NOTE — Patient Instructions (Signed)
Your physician has recommended that you wear a holter monitor. Holter monitors are medical devices that record the heart's electrical activity. Doctors most often use these monitors to diagnose arrhythmias. Arrhythmias are problems with the speed or rhythm of the heartbeat. The monitor is a small, portable device. You can wear one while you do your normal daily activities. This is usually used to diagnose what is causing palpitations/syncope (passing out).  Your physician recommends that you schedule a follow-up appointment in: 3 months with echocardiogram

## 2014-04-04 DIAGNOSIS — R002 Palpitations: Secondary | ICD-10-CM | POA: Insufficient documentation

## 2014-04-04 NOTE — Progress Notes (Signed)
Patient ID: ABBIGAEL DETLEFSEN, female   DOB: 1946/05/08, 68 y.o.   MRN: [id number] Primary oncologist: Dr. Jana Hakim  68 yo with HTN and breast cancer presents for cardiology evaluation of palpitations.  Patient has had no known cardiac problems.  She was diagnosed with breast cancer (ER+/PR-/HER2-) in 7/15 and has had lumpectomy.  She is now getting chemotherapy with doxorubicin and cyclophosphamide.  A couple of days after she gets her chemo infusion, she has been noting bothersome chest fluttering. This will go on for several days then resolve.  No lightheadedness, syncope, chest pain, or dyspnea.  Currently, she is noting any palpitations.  She has some generalized weakness but no exertional dyspnea.    ECG: NSR, normal  Labs (10/15): K 3.7, creatinine 0.9  PMH: 1. HTN 2. Breast cancer: Left breast mass found 7/15, biopsy showed adenocarcinoma (ER+/PR-/HER2-).  She had lumpectomy and is now getting adjuvant chemo with doxorubicin and cyclophosphamide.  She will be getting adjuvant radiation.  - Echo (8/15): EF 55-60%, GLS -19.6%.  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   FH: Father with CVA.   ROS: All systems reviewed and negative except as per HPI.   Current Outpatient Prescriptions  Medication Sig Dispense Refill  . amLODipine (NORVASC) 5 MG tablet Take 5 mg by mouth daily.      . Cholecalciferol (VITAMIN D3) 2000 UNITS TABS Take 1 tablet by mouth daily.      [name] dexamethasone (DECADRON) 4 MG tablet Take 2 tablets by mouth once a day on the day after chemotherapy and then take 2 tablets two times a day for 2 days. Take with food.  30 tablet  1  . Diphenhyd-Hydrocort-Nystatin (FIRST-DUKES MOUTHWASH) SUSP 5-10 ml qid PRN SWISH SWALLOW OR SPIT  240 mL  1  . docusate sodium (COLACE) 100 MG capsule Take 100 mg by mouth 2 (two) times daily.      . fluconazole (DIFLUCAN) 100 MG tablet Take 1  tablet (100 mg total) by mouth daily.  10 tablet  0  . Ginkgo Biloba 120 MG CAPS Take 1 capsule by mouth daily.      [name] HYDROcodone-acetaminophen (NORCO/VICODIN) 5-325 MG per tablet       . lidocaine-prilocaine (EMLA) cream Apply 1 application topically as needed. Apply over port site 1-2 hours before chemo and cover with plastic wrap  30 g  0  . LORazepam (ATIVAN) 0.5 MG tablet Take 1 tablet (0.5 mg total) by mouth at bedtime as needed (Nausea or vomiting).  30 tablet  0  . meloxicam (MOBIC) 15 MG tablet Take 15 mg by mouth daily as needed for pain.      [name] nystatin-triamcinolone ointment (MYCOLOG)       . ondansetron (ZOFRAN) 8 MG tablet Take 1 tablet (8 mg total) by mouth 2 (two) times daily as needed. Start on the third day after chemotherapy.  30 tablet  1  . oxyCODONE (OXY IR/ROXICODONE) 5 MG immediate release tablet Take 1-2 tablets (5-10 mg total) by mouth every 4 (four) hours as needed for moderate pain, severe pain or breakthrough pain.  40 tablet  0  . prochlorperazine (COMPAZINE) 10 MG tablet Take 1 tablet (10 mg total) by mouth every 6 (six) hours as needed (Nausea or vomiting).  30 tablet  1  . UNABLE TO FIND Take 3 tablets by mouth 2 (two) times daily. OMEGA XL      .  valsartan-hydrochlorothiazide (DIOVAN-HCT) 320-25 MG per tablet Take 1 tablet by mouth daily.       No current facility-administered medications for this encounter.    BP 116/64  Pulse 87  Ht _0  (1.676 m)  Wt 251 lb 12.8 oz (114.216 kg)  BMI 40.66 kg/m2  SpO2 97% General: NAD Neck: No JVD, no thyromegaly or thyroid nodule.  Lungs: Clear to auscultation bilaterally with normal respiratory effort. CV: Nondisplaced PMI.  Heart regular S1/S2, no S3/S4, no murmur.  No peripheral edema.  No carotid bruit.  Normal pedal pulses.  Abdomen: Soft, nontender, no hepatosplenomegaly, no distention.  Skin: Intact without lesions or rashes.  Neurologic: Alert and oriented x 3.  Psych: Normal affect. Extremities: No  clubbing or cyanosis.  HEENT: Normal.   Assessment/Plan: 1. Palpitations: Patient will get palpitations for several days starting about 2 days after chemotherapy infusion.  These sound like PACs versus PVCs.  I will have her wear a 48 hour holter starting 2 days after her next chemo session next week.   2. HTN: BP is under good control.  3. Doxorubicin-based chemotherapy: Normal baseline echo, normal echo today after 2 cycles of chemo (4 total).  I will repeat an echo in 3 months after she has completed doxorubicin to reassess EF given risk of cardiac toxicity with doxorubicin.   Loralie Champagne 04/04/2014

## 2014-04-09 ENCOUNTER — Other Ambulatory Visit: Payer: Medicare (Managed Care)

## 2014-04-09 ENCOUNTER — Encounter: Payer: Self-pay | Admitting: Nurse Practitioner

## 2014-04-09 ENCOUNTER — Other Ambulatory Visit: Payer: Self-pay | Admitting: *Deleted

## 2014-04-09 ENCOUNTER — Ambulatory Visit (HOSPITAL_BASED_OUTPATIENT_CLINIC_OR_DEPARTMENT_OTHER): Payer: MEDICARE | Admitting: Nurse Practitioner

## 2014-04-09 ENCOUNTER — Other Ambulatory Visit (HOSPITAL_BASED_OUTPATIENT_CLINIC_OR_DEPARTMENT_OTHER): Payer: MEDICARE

## 2014-04-09 ENCOUNTER — Ambulatory Visit (HOSPITAL_BASED_OUTPATIENT_CLINIC_OR_DEPARTMENT_OTHER): Payer: MEDICARE

## 2014-04-09 DIAGNOSIS — H579 Unspecified disorder of eye and adnexa: Secondary | ICD-10-CM | POA: Insufficient documentation

## 2014-04-09 DIAGNOSIS — C50512 Malignant neoplasm of lower-outer quadrant of left female breast: Secondary | ICD-10-CM

## 2014-04-09 DIAGNOSIS — R6889 Other general symptoms and signs: Secondary | ICD-10-CM

## 2014-04-09 DIAGNOSIS — R002 Palpitations: Secondary | ICD-10-CM

## 2014-04-09 DIAGNOSIS — G4700 Insomnia, unspecified: Secondary | ICD-10-CM

## 2014-04-09 DIAGNOSIS — Z5111 Encounter for antineoplastic chemotherapy: Secondary | ICD-10-CM

## 2014-04-09 LAB — CBC WITH DIFFERENTIAL/PLATELET
BASO%: 1.4 % (ref 0.0–2.0)
Basophils Absolute: 0.1 10*3/uL (ref 0.0–0.1)
EOS ABS: 0 10*3/uL (ref 0.0–0.5)
EOS%: 0.1 % (ref 0.0–7.0)
HEMATOCRIT: 36.2 % (ref 34.8–46.6)
HGB: 11.7 g/dL (ref 11.6–15.9)
LYMPH%: 12.2 % — AB (ref 14.0–49.7)
MCH: 27.8 pg (ref 25.1–34.0)
MCHC: 32.2 g/dL (ref 31.5–36.0)
MCV: 86.2 fL (ref 79.5–101.0)
MONO#: 0.9 10*3/uL (ref 0.1–0.9)
MONO%: 9.1 % (ref 0.0–14.0)
NEUT%: 77.2 % — AB (ref 38.4–76.8)
NEUTROS ABS: 7.5 10*3/uL — AB (ref 1.5–6.5)
PLATELETS: 251 10*3/uL (ref 145–400)
RBC: 4.2 10*6/uL (ref 3.70–5.45)
RDW: 14.6 % — ABNORMAL HIGH (ref 11.2–14.5)
WBC: 9.7 10*3/uL (ref 3.9–10.3)
lymph#: 1.2 10*3/uL (ref 0.9–3.3)

## 2014-04-09 LAB — COMPREHENSIVE METABOLIC PANEL (CC13)
ALK PHOS: 92 U/L (ref 40–150)
ALT: 25 U/L (ref 0–55)
AST: 17 U/L (ref 5–34)
Albumin: 3.2 g/dL — ABNORMAL LOW (ref 3.5–5.0)
Anion Gap: 9 mEq/L (ref 3–11)
BILIRUBIN TOTAL: 0.2 mg/dL (ref 0.20–1.20)
BUN: 12.2 mg/dL (ref 7.0–26.0)
CO2: 27 meq/L (ref 22–29)
Calcium: 9.9 mg/dL (ref 8.4–10.4)
Chloride: 106 mEq/L (ref 98–109)
Creatinine: 0.9 mg/dL (ref 0.6–1.1)
GLUCOSE: 114 mg/dL (ref 70–140)
Potassium: 3.8 mEq/L (ref 3.5–5.1)
SODIUM: 141 meq/L (ref 136–145)
TOTAL PROTEIN: 6.8 g/dL (ref 6.4–8.3)

## 2014-04-09 MED ORDER — SODIUM CHLORIDE 0.9 % IV SOLN
600.0000 mg/m2 | Freq: Once | INTRAVENOUS | Status: AC
  Administered 2014-04-09: 1400 mg via INTRAVENOUS
  Filled 2014-04-09: qty 70

## 2014-04-09 MED ORDER — PALONOSETRON HCL INJECTION 0.25 MG/5ML
INTRAVENOUS | Status: AC
  Filled 2014-04-09: qty 5

## 2014-04-09 MED ORDER — FOSAPREPITANT DIMEGLUMINE INJECTION 150 MG
150.0000 mg | Freq: Once | INTRAVENOUS | Status: AC
Start: 2014-04-09 — End: 2014-04-09
  Administered 2014-04-09: 150 mg via INTRAVENOUS
  Filled 2014-04-09: qty 5

## 2014-04-09 MED ORDER — SODIUM CHLORIDE 0.9 % IV SOLN
Freq: Once | INTRAVENOUS | Status: AC
  Administered 2014-04-09: 10:00:00 via INTRAVENOUS

## 2014-04-09 MED ORDER — DOXORUBICIN HCL CHEMO IV INJECTION 2 MG/ML
60.0000 mg/m2 | Freq: Once | INTRAVENOUS | Status: AC
  Administered 2014-04-09: 140 mg via INTRAVENOUS
  Filled 2014-04-09: qty 70

## 2014-04-09 MED ORDER — DEXAMETHASONE 4 MG PO TABS: ORAL_TABLET | ORAL | Status: DC

## 2014-04-09 MED ORDER — DEXAMETHASONE SODIUM PHOSPHATE 20 MG/5ML IJ SOLN
INTRAMUSCULAR | Status: AC
  Filled 2014-04-09: qty 5

## 2014-04-09 MED ORDER — HEPARIN SOD (PORK) LOCK FLUSH 100 UNIT/ML IV SOLN
500.0000 [IU] | Freq: Once | INTRAVENOUS | Status: AC | PRN
  Administered 2014-04-09: 500 [IU]
  Filled 2014-04-09: qty 5

## 2014-04-09 MED ORDER — LORAZEPAM 0.5 MG PO TABS: 0.5000 mg | ORAL_TABLET | Freq: Every evening | ORAL | Status: DC | PRN

## 2014-04-09 MED ORDER — PALONOSETRON HCL INJECTION 0.25 MG/5ML
0.2500 mg | Freq: Once | INTRAVENOUS | Status: AC
  Administered 2014-04-09: 0.25 mg via INTRAVENOUS

## 2014-04-09 MED ORDER — DEXAMETHASONE SODIUM PHOSPHATE 20 MG/5ML IJ SOLN
12.0000 mg | Freq: Once | INTRAMUSCULAR | Status: AC
  Administered 2014-04-09: 12 mg via INTRAVENOUS

## 2014-04-09 MED ORDER — SODIUM CHLORIDE 0.9 % IJ SOLN
10.0000 mL | INTRAMUSCULAR | Status: DC | PRN
  Administered 2014-04-09: 10 mL
  Filled 2014-04-09: qty 10

## 2014-04-09 NOTE — Progress Notes (Signed)
[facility]  Telephone:([phone]) [phone] Fax:([phone]) [phone]     ID: Monica [name] DOB: March 29, 1946  MR#: [id number]  JYN#:[id number]  Patient Care Team: Maury Dus, MD as PCP - General (Family Medicine) Excell Seltzer, MD as Consulting Physician (General Surgery) Chauncey Cruel, MD as Consulting Physician (Oncology) Marye Round, MD as Consulting Physician (Radiation Oncology) Maisie Fus, MD as Consulting Physician (Obstetrics and Gynecology)   CHIEF COMPLAINT: Newly diagnosed breast cancer  CURRENT TREATMENT:  On adjuvant chemotherapy   BREAST CANCER HISTORY: From the original intake note:  Monica [name] showing a possible mass in her left breast. On 01/05/2014 left diagnostic mammography and ultrasonography at the breast Center showed 2 adjacent poorly defined masses deep in the lower outer portion of the left breast. On physical exam these were palpable measuring approximately 3 cm altogether, at the 4:00 position 16 cm from the left nipple. There were no palpable left axillary lymph nodes. Ultrasound confirmed to and adjacent irregularly marginated hypoechoic masses in the area in question, measuring 1.5 and 1.0 cm respectively. The total area of by ultrasonography measured 2.6 cm. In addition, to left axillary lymph nodes showed mild cortical thickening in one of them showed loss of the normal fatty hilum.  On 01/10/2014 the patient underwent separate biopsies of both the left breast masses in question as well as the more suspicious lymph node. The final pathology ([id number]) showed the lymph node to be benign. (This was interpreted as concordant). Both of the breast masses, however, were positive for an invasive ductal carcinoma, which was HER-2 not amplified, with the signals ratio of 0.98 and the number per cell being 2.45. Estrogen and progesterone receptor determination is pending but looks negative  at this point.  On 01/16/2014 the patient underwent bilateral breast MRI the right breast was unremarkable. In the left breast lower outer quadrant there were 2 oval masses measuring 1.5 and 2.2 cm. Taken together they was approximately 4 cm of disease. There were no findings of concern in the right breast. In the left axilla there was a single mildly prominent lymph node. There was no suspicious internal mammary adenopathy.  The patient's subsequent history is as detailed below  INTERVAL HISTORY: Monica [name] returns today for followup of her breast cancer accompanied by her god-daughter, [name]. Today is day 1, cycle 3 of 4 planned cycles doxorubicin and cyclophosphamide, with neulasta given on day 2 for granulocyte support. The interval history is significant for work up of her post-chemotherapy palpitations by Dr. Aundra Dubin. She will return this Wednesday for the placement of a holter monitory to check for irregular rhythms. Her ejection fraction is well preserved at 60-65%  REVIEW OF SYSTEMS: Monica [name] denies fevers, chills, nausea, vomiting, or changes in bowel or bladder habits. She stays well hydrated and her appetite is healthy. Her mucositis has healed with the use of magic mouth wash. She continues to struggle with insomnia, sleeping on average 4hrs per night, despite experiencing fatigue. She sometimes becomes short of breath when the palpations hit, but she denies chest pain or cough. Her eyes are red and irritated, possibly due to allergies during the change in season.  A detailed review of systems is otherwise noncontributory.   PAST MEDICAL HISTORY: Past Medical History  Diagnosis Date  . Allergy codeine    rapid heart beat, cold sweat  . Arthritis   . Breast cancer   . Hypertension     PAST SURGICAL HISTORY:  Past Surgical History  Procedure Laterality Date  . Knee arthroscopy  2012    right  . Lumbar laminectomy  1991  . Colonoscopy    . Foot arthroplasty  1998    right  .  Abdominal hysterectomy  1992  . Breast lumpectomy with needle localization and axillary sentinel lymph node bx Left 02/12/2014    Procedure: LEFT BREAST LUMPECTOMY WITH NEEDLE LOCALIZATION AND LEFT AXILLARY SENTINEL LYMPH NODE BX;  Surgeon: [name], MD;  Location: [place];  Service: General;  Laterality: Left;  . Portacath placement Right 02/12/2014    Procedure: INSERTION PORT-A-CATH;  Surgeon: [name], MD;  Location: [place];  Service: General;  Laterality: Right;    FAMILY HISTORY Family History  Problem Relation Age of Onset  . Cancer Sister   . Cancer Brother   . Cancer Maternal Aunt   . Cancer Paternal Aunt    the patient's father died at the age of 69 from a stroke. The patient's mother died at the age of 14 from "multiple causes". The patient had 2 brothers and 2 sisters. The patient's sister was diagnosed with uterine cancer at the age of 84, and the patient's brother [name] had "bone cancer" and died in his 18s. He was treated at the [facility]. There is no history of breast or ovarian cancer in the family to the patient's knowledge.  GYNECOLOGIC HISTORY:  No LMP recorded. Patient has had a hysterectomy. Menarche age 87, the patient is GX P0. She underwent hysterectomy in 1992, with no salpingo-oophorectomy. She has been on estrogen replacement since. 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 Her husband Monica [name] worked for [employer] in [place] as a Facilities manager. The patient's adopted son Monica [name] works as a laborer in [place]. The patient's god-daughter Monica [name] is an Optometrist. The patient has no grandchildren. The patient attends a local church of  [place], and her husband Monica [name] is pastor   ADVANCED DIRECTIVES: In place   HEALTH MAINTENANCE: History  Substance Use Topics  .  Smoking status: Never Smoker   . Smokeless tobacco: Not on file  . Alcohol Use: No     Colonoscopy: October 2008  PAP: July 2015  Bone density: 01/01/2014  Lipid panel:  Allergies  Allergen Reactions  . Codeine     Heart fast    Current Outpatient Prescriptions  Medication Sig Dispense Refill  . amLODipine (NORVASC) 5 MG tablet Take 5 mg by mouth daily.      . Cholecalciferol (VITAMIN D3) 2000 UNITS TABS Take 1 tablet by mouth daily.      . Diphenhyd-Hydrocort-Nystatin (FIRST-DUKES MOUTHWASH) SUSP 5-10 ml qid PRN SWISH SWALLOW OR SPIT  240 mL  1  . docusate sodium (COLACE) 100 MG capsule Take 100 mg by mouth 2 (two) times daily.      . Ginkgo Biloba 120 MG CAPS Take 1 capsule by mouth daily.      [name] HYDROcodone-acetaminophen (NORCO/VICODIN) 5-325 MG per tablet       . lidocaine-prilocaine (EMLA) cream Apply 1 application topically as needed. Apply over port site 1-2 hours before chemo and cover with plastic wrap  30 g  0  . ondansetron (ZOFRAN) 8 MG tablet Take 1 tablet (8 mg total) by mouth 2 (two) times daily as needed. Start on the third day after chemotherapy.  Encinal  tablet  1  . prochlorperazine (COMPAZINE) 10 MG tablet Take 1 tablet (10 mg total) by mouth every 6 (six) hours as needed (Nausea or vomiting).  30 tablet  1  . UNABLE TO FIND Take 3 tablets by mouth 2 (two) times daily. OMEGA XL      . valsartan-hydrochlorothiazide (DIOVAN-HCT) 320-25 MG per tablet Take 1 tablet by mouth daily.      [name] dexamethasone (DECADRON) 4 MG tablet Take 2 tablets by mouth once a day on the day after chemotherapy and then take 2 tablets two times a day for 2 days. Take with food.  30 tablet  1  . fluconazole (DIFLUCAN) 100 MG tablet Take 1 tablet (100 mg total) by mouth daily.  10 tablet  0  . LORazepam (ATIVAN) 0.5 MG tablet Take 1 tablet (0.5 mg total) by mouth at bedtime as needed (Nausea or vomiting).  30 tablet  0  . meloxicam (MOBIC) 15 MG tablet Take 15 mg by mouth daily as needed for pain.       [name] nystatin-triamcinolone ointment (MYCOLOG)       . oxyCODONE (OXY IR/ROXICODONE) 5 MG immediate release tablet Take 1-2 tablets (5-10 mg total) by mouth every 4 (four) hours as needed for moderate pain, severe pain or breakthrough pain.  40 tablet  0   No current facility-administered medications for this visit.    OBJECTIVE: Middle-aged [place] American woman who appears stated age There were no vitals filed for this visit.   There is no weight on file to calculate BMI.    ECOG FS:1 - Symptomatic but completely ambulatory  GENERAL: Patient is a well appearing female in no acute distress Skin: warm, dry HEENT: sclerae pink. No thrush or mucositis.  Lymph Nodes: No cervical or supraclavicular lymphadenopathy  Lungs: clear to auscultation bilaterally, no rales, wheezes, or rhonci  Heart: regular rate and rhythm  Abdomen: round, soft, non tender, positive bowel sounds  Musculoskeletal: No focal spinal tenderness, no peripheral edema  Neuro: non focal, well oriented, positive affect  Breasts: deferred, bilateral axillae benign  LAB RESULTS:  CMP     Component Value Date/Time   NA 141 04/09/2014 0818   NA 141 11/03/2010 1352   K 3.8 04/09/2014 0818   K 3.3* 11/03/2010 1352   CO2 27 04/09/2014 0818   GLUCOSE 114 04/09/2014 0818   GLUCOSE 83 11/03/2010 1352   BUN 12.2 04/09/2014 0818   CREATININE 0.9 04/09/2014 0818   CALCIUM 9.9 04/09/2014 0818   PROT 6.8 04/09/2014 0818   ALBUMIN 3.2* 04/09/2014 0818   AST 17 04/09/2014 0818   ALT 25 04/09/2014 0818   ALKPHOS 92 04/09/2014 0818   BILITOT 0.20 04/09/2014 0818    I No results found for this basename: SPEP,  UPEP,   kappa and lambda light chains    Lab Results  Component Value Date   WBC 9.7 04/09/2014   NEUTROABS 7.5* 04/09/2014   HGB 11.7 04/09/2014   HCT 36.2 04/09/2014   MCV 86.2 04/09/2014   PLT 251 04/09/2014      Chemistry      Component Value Date/Time   NA 141 04/09/2014 0818   NA 141 11/03/2010 1352    K 3.8 04/09/2014 0818   K 3.3* 11/03/2010 1352   CO2 27 04/09/2014 0818   BUN 12.2 04/09/2014 0818   CREATININE 0.9 04/09/2014 0818      Component Value Date/Time   CALCIUM 9.9 04/09/2014 0818   ALKPHOS 92 04/09/2014 0818  AST 17 04/09/2014 0818   ALT 25 04/09/2014 0818   BILITOT 0.20 04/09/2014 0818       No results found for this basename: LABCA2    No components found with this basename: LABCA125    No results found for this basename: INR,  in the last 168 hours  Urinalysis No results found for this basename: colorurine,  appearanceur,  labspec,  phurine,  glucoseu,  hgbur,  bilirubinur,  ketonesur,  proteinur,  urobilinogen,  nitrite,  leukocytesur    STUDIES: Most recent echocardiogram on 140/13/15 showed a well preserved ejection fraction of 60-65%   ASSESSMENT: 68 y.o. [place] woman status post left breast biopsy of 2 separate lower outer quadrant masses 01/10/2014 for a multifocal cT2 cNX, stage II invasive ductal carcinoma, grade 3, with an MIB-1 of 90%, HER-2 negative, estrogen receptors 3% positive with weak staining, progesterone receptor negative, with an MIB-1 of 90%; this is clinically a "triple-negative" breast cancer and will be treated as such  (1) a suspicious left axillary lymph node biopsied 01/10/2014 was negative (concordant)  (2) left lumpectomy and sentinel lymph node sampling 02/12/2014 showed an mpT2 pN0, stage IIA invasive ductal carcinoma grade 3, repeat HER-2 pending  (3) adjuvant chemotherapy starting 03/12/2014 to consist of dose dense doxorubicin and cyclophosphamide x4, with Neulasta support, followed by weekly paclitaxel and carboplatin x12  (4) adjuvant radiation to follow chemotherapy  PLAN: Solaris is doing well today. The labs were reviewed in detail and were entirely stable, no neutropenia or anemia noted. We will proceed with day 1, cycle 3 doxorubicin and cyclophosphamide today.   We discussed her insomnia and I suggested she try  melatonin 7m QHS as a natural option for sleep. At this time she was uninterested in a prescription strength intervention. She will try extending her Claritin use daily for her itchy red eyes, and will start using Refresh Allergy drops for additional relief.   EShenellwill return next week for her nadir visit. She understands and agrees with this plan. She knows the goal of treatment in her case is cure. She has been encouraged to call with any issues that might arise before her next visit here.    FMarcelino Duster [id number]/19/2015 9:29 AM

## 2014-04-09 NOTE — Patient Instructions (Signed)
[facility] Discharge Instructions for Patients Receiving Chemotherapy  Today you received the following chemotherapy agents Adriamycin,cytoxan To help prevent nausea and vomiting after your treatment, we encourage you to take your nausea medication as prescribed. If you develop nausea and vomiting that is not controlled by your nausea medication, call the clinic.   BELOW ARE SYMPTOMS THAT SHOULD BE REPORTED IMMEDIATELY:  *FEVER GREATER THAN 100.5 F  *CHILLS WITH OR WITHOUT FEVER  NAUSEA AND VOMITING THAT IS NOT CONTROLLED WITH YOUR NAUSEA MEDICATION  *UNUSUAL SHORTNESS OF BREATH  *UNUSUAL BRUISING OR BLEEDING  TENDERNESS IN MOUTH AND THROAT WITH OR WITHOUT PRESENCE OF ULCERS  *URINARY PROBLEMS  *BOWEL PROBLEMS  UNUSUAL RASH Items with * indicate a potential emergency and should be followed up as soon as possible.  Feel free to call the clinic you have any questions or concerns. The clinic phone number is [phone].    

## 2014-04-10 ENCOUNTER — Ambulatory Visit (HOSPITAL_BASED_OUTPATIENT_CLINIC_OR_DEPARTMENT_OTHER): Payer: MEDICARE

## 2014-04-10 VITALS — BP 143/75 | HR 87 | Temp 97.8°F

## 2014-04-10 DIAGNOSIS — Z5189 Encounter for other specified aftercare: Secondary | ICD-10-CM

## 2014-04-10 DIAGNOSIS — C50512 Malignant neoplasm of lower-outer quadrant of left female breast: Secondary | ICD-10-CM

## 2014-04-10 MED ORDER — PEGFILGRASTIM INJECTION 6 MG/0.6ML
6.0000 mg | Freq: Once | SUBCUTANEOUS | Status: AC
  Administered 2014-04-10: 6 mg via SUBCUTANEOUS
  Filled 2014-04-10: qty 0.6

## 2014-04-10 NOTE — Patient Instructions (Signed)
Pegfilgrastim injection What is this medicine? PEGFILGRASTIM (peg fil GRA stim) is a long-acting granulocyte colony-stimulating factor that stimulates the growth of neutrophils, a type of white blood cell important in the body's fight against infection. It is used to reduce the incidence of fever and infection in patients with certain types of cancer who are receiving chemotherapy that affects the bone marrow. This medicine may be used for other purposes; ask your health care provider or pharmacist if you have questions. COMMON BRAND NAME(S): Neulasta What should I tell my health care provider before I take this medicine? They need to know if you have any of these conditions: -latex allergy -ongoing radiation therapy -sickle cell disease -skin reactions to acrylic adhesives (On-Body Injector only) -an unusual or allergic reaction to pegfilgrastim, filgrastim, other medicines, foods, dyes, or preservatives -pregnant or trying to get pregnant -breast-feeding How should I use this medicine? This medicine is for injection under the skin. If you get this medicine at home, you will be taught how to prepare and give the pre-filled syringe or how to use the On-body Injector. Refer to the patient Instructions for Use for detailed instructions. Use exactly as directed. Take your medicine at regular intervals. Do not take your medicine more often than directed. It is important that you put your used needles and syringes in a special sharps container. Do not put them in a trash can. If you do not have a sharps container, call your pharmacist or healthcare provider to get one. Talk to your pediatrician regarding the use of this medicine in children. Special care may be needed. Overdosage: If you think you have taken too much of this medicine contact a poison control center or emergency room at once. NOTE: This medicine is only for you. Do not share this medicine with others. What if I miss a dose? It is  important not to miss your dose. Call your doctor or health care professional if you miss your dose. If you miss a dose due to an On-body Injector failure or leakage, a new dose should be administered as soon as possible using a single prefilled syringe for manual use. What may interact with this medicine? Interactions have not been studied. Give your health care provider a list of all the medicines, herbs, non-prescription drugs, or dietary supplements you use. Also tell them if you smoke, drink alcohol, or use illegal drugs. Some items may interact with your medicine. This list may not describe all possible interactions. Give your health care provider a list of all the medicines, herbs, non-prescription drugs, or dietary supplements you use. Also tell them if you smoke, drink alcohol, or use illegal drugs. Some items may interact with your medicine. What should I watch for while using this medicine? You may need blood work done while you are taking this medicine. If you are going to need a MRI, CT scan, or other procedure, tell your doctor that you are using this medicine (On-Body Injector only). What side effects may I notice from receiving this medicine? Side effects that you should report to your doctor or health care professional as soon as possible: -allergic reactions like skin rash, itching or hives, swelling of the face, lips, or tongue -dizziness -fever -pain, redness, or irritation at site where injected -pinpoint red spots on the skin -shortness of breath or breathing problems -stomach or side pain, or pain at the shoulder -swelling -tiredness -trouble passing urine Side effects that usually do not require medical attention (report to your doctor   or health care professional if they continue or are bothersome): -bone pain -muscle pain This list may not describe all possible side effects. Call your doctor for medical advice about side effects. You may report side effects to FDA at  1-800-FDA-1088. Where should I keep my medicine? Keep out of the reach of children. Store pre-filled syringes in a refrigerator between 2 and 8 degrees C (36 and 46 degrees F). Do not freeze. Keep in carton to protect from light. Throw away this medicine if it is left out of the refrigerator for more than 48 hours. Throw away any unused medicine after the expiration date. NOTE: This sheet is a summary. It may not cover all possible information. If you have questions about this medicine, talk to your doctor, pharmacist, or health care provider.  2015, Elsevier/Gold Standard. (2013-09-07 16:14:05)  

## 2014-04-11 ENCOUNTER — Encounter: Payer: Self-pay | Admitting: *Deleted

## 2014-04-11 ENCOUNTER — Encounter (INDEPENDENT_AMBULATORY_CARE_PROVIDER_SITE_OTHER): Payer: MEDICARE

## 2014-04-11 DIAGNOSIS — R002 Palpitations: Secondary | ICD-10-CM

## 2014-04-11 NOTE — Progress Notes (Signed)
Patient ID: Monica [name], female   DOB: 1946-03-18, 68 y.o.   MRN: [id number] [facility] 48 hour holter monitor applied to patient.

## 2014-04-15 ENCOUNTER — Inpatient Hospital Stay (HOSPITAL_COMMUNITY)
Admission: EM | Admit: 2014-04-15 | Discharge: 2014-04-18 | DRG: 809 | Disposition: A | Discharge status: Home / Self Care | Payer: MEDICARE | Attending: Internal Medicine | Admitting: Internal Medicine

## 2014-04-15 ENCOUNTER — Emergency Department (HOSPITAL_COMMUNITY): Payer: MEDICARE

## 2014-04-15 ENCOUNTER — Encounter (HOSPITAL_COMMUNITY): Payer: Self-pay | Admitting: Emergency Medicine

## 2014-04-15 DIAGNOSIS — C50512 Malignant neoplasm of lower-outer quadrant of left female breast: Secondary | ICD-10-CM | POA: Diagnosis present

## 2014-04-15 DIAGNOSIS — R110 Nausea: Secondary | ICD-10-CM

## 2014-04-15 DIAGNOSIS — M1990 Unspecified osteoarthritis, unspecified site: Secondary | ICD-10-CM | POA: Diagnosis present

## 2014-04-15 DIAGNOSIS — R002 Palpitations: Secondary | ICD-10-CM

## 2014-04-15 DIAGNOSIS — I10 Essential (primary) hypertension: Secondary | ICD-10-CM | POA: Diagnosis present

## 2014-04-15 DIAGNOSIS — R509 Fever, unspecified: Secondary | ICD-10-CM | POA: Diagnosis not present

## 2014-04-15 DIAGNOSIS — D61810 Antineoplastic chemotherapy induced pancytopenia: Secondary | ICD-10-CM | POA: Diagnosis present

## 2014-04-15 DIAGNOSIS — N390 Urinary tract infection, site not specified: Secondary | ICD-10-CM

## 2014-04-15 DIAGNOSIS — R12 Heartburn: Secondary | ICD-10-CM

## 2014-04-15 DIAGNOSIS — D709 Neutropenia, unspecified: Secondary | ICD-10-CM | POA: Diagnosis not present

## 2014-04-15 DIAGNOSIS — E876 Hypokalemia: Secondary | ICD-10-CM | POA: Diagnosis present

## 2014-04-15 DIAGNOSIS — Z79899 Other long term (current) drug therapy: Secondary | ICD-10-CM

## 2014-04-15 DIAGNOSIS — R5081 Fever presenting with conditions classified elsewhere: Secondary | ICD-10-CM | POA: Diagnosis present

## 2014-04-15 DIAGNOSIS — J069 Acute upper respiratory infection, unspecified: Secondary | ICD-10-CM

## 2014-04-15 DIAGNOSIS — R6889 Other general symptoms and signs: Secondary | ICD-10-CM

## 2014-04-15 DIAGNOSIS — B370 Candidal stomatitis: Secondary | ICD-10-CM

## 2014-04-15 LAB — URINALYSIS, ROUTINE W REFLEX MICROSCOPIC
Bilirubin Urine: NEGATIVE
GLUCOSE, UA: NEGATIVE mg/dL
HGB URINE DIPSTICK: NEGATIVE
KETONES UR: NEGATIVE mg/dL
Leukocytes, UA: NEGATIVE
Nitrite: NEGATIVE
PH: 7 (ref 5.0–8.0)
Protein, ur: NEGATIVE mg/dL
SPECIFIC GRAVITY, URINE: 1.017 (ref 1.005–1.030)
Urobilinogen, UA: 1 mg/dL (ref 0.0–1.0)

## 2014-04-15 LAB — CBC WITH DIFFERENTIAL/PLATELET
BASOS ABS: 0 10*3/uL (ref 0.0–0.1)
Basophils Relative: 0 % (ref 0–1)
EOS ABS: 0 10*3/uL (ref 0.0–0.7)
Eosinophils Relative: 1 % (ref 0–5)
HEMATOCRIT: 32.6 % — AB (ref 36.0–46.0)
Hemoglobin: 10.9 g/dL — ABNORMAL LOW (ref 12.0–15.0)
Lymphocytes Relative: 27 % (ref 12–46)
Lymphs Abs: 0.3 10*3/uL — ABNORMAL LOW (ref 0.7–4.0)
MCH: 28.2 pg (ref 26.0–34.0)
MCHC: 33.4 g/dL (ref 30.0–36.0)
MCV: 84.2 fL (ref 78.0–100.0)
Monocytes Absolute: 0 10*3/uL — ABNORMAL LOW (ref 0.1–1.0)
Monocytes Relative: 3 % (ref 3–12)
Neutro Abs: 0.8 10*3/uL — ABNORMAL LOW (ref 1.7–7.7)
Neutrophils Relative %: 69 % (ref 43–77)
Platelets: 110 10*3/uL — ABNORMAL LOW (ref 150–400)
RBC: 3.87 MIL/uL (ref 3.87–5.11)
RDW: 15.2 % (ref 11.5–15.5)
WBC: 1.1 10*3/uL — CL (ref 4.0–10.5)

## 2014-04-15 LAB — COMPREHENSIVE METABOLIC PANEL
ALBUMIN: 2.9 g/dL — AB (ref 3.5–5.2)
ALK PHOS: 111 U/L (ref 39–117)
ALT: 17 U/L (ref 0–35)
AST: 12 U/L (ref 0–37)
Anion gap: 13 (ref 5–15)
BUN: 15 mg/dL (ref 6–23)
CO2: 26 mEq/L (ref 19–32)
CREATININE: 0.77 mg/dL (ref 0.50–1.10)
Calcium: 9.2 mg/dL (ref 8.4–10.5)
Chloride: 95 mEq/L — ABNORMAL LOW (ref 96–112)
GFR calc Af Amer: >90 mL/min (ref >90)
GFR calc non Af Amer: 86 mL/min — ABNORMAL LOW (ref >90)
Glucose, Bld: 235 mg/dL — ABNORMAL HIGH (ref 70–99)
POTASSIUM: 3.1 meq/L — AB (ref 3.7–5.3)
Sodium: 134 mEq/L — ABNORMAL LOW (ref 137–147)
TOTAL PROTEIN: 6.4 g/dL (ref 6.0–8.3)
Total Bilirubin: 0.6 mg/dL (ref 0.3–1.2)

## 2014-04-15 LAB — I-STAT CG4 LACTIC ACID, ED: Lactic Acid, Venous: 2.36 mmol/L — ABNORMAL HIGH (ref 0.5–2.2)

## 2014-04-15 MED ORDER — SODIUM CHLORIDE 0.9 % IV SOLN
Freq: Once | INTRAVENOUS | Status: AC
  Administered 2014-04-15: 19:00:00 via INTRAVENOUS

## 2014-04-15 MED ORDER — ONDANSETRON HCL 4 MG/2ML IJ SOLN: 4.0000 mg | Freq: Four times a day (QID) | INTRAMUSCULAR | Status: DC | PRN

## 2014-04-15 MED ORDER — VITAMIN D3 25 MCG (1000 UNIT) PO TABS
2000.0000 [IU] | ORAL_TABLET | Freq: Every day | ORAL | Status: DC
  Administered 2014-04-16 – 2014-04-18 (×3): 2000 [IU] via ORAL
  Filled 2014-04-15 (×3): qty 2

## 2014-04-15 MED ORDER — SODIUM CHLORIDE 0.9 % IV SOLN
INTRAVENOUS | Status: DC
  Administered 2014-04-16 – 2014-04-18 (×4): via INTRAVENOUS

## 2014-04-15 MED ORDER — ONDANSETRON HCL 4 MG PO TABS: 4.0000 mg | ORAL_TABLET | Freq: Four times a day (QID) | ORAL | Status: DC | PRN

## 2014-04-15 MED ORDER — ENOXAPARIN SODIUM 60 MG/0.6ML ~~LOC~~ SOLN
60.0000 mg | Freq: Every day | SUBCUTANEOUS | Status: DC
  Administered 2014-04-16 – 2014-04-17 (×3): 60 mg via SUBCUTANEOUS
  Filled 2014-04-15 (×5): qty 0.6

## 2014-04-15 MED ORDER — POTASSIUM CHLORIDE 20 MEQ/15ML (10%) PO LIQD
40.0000 meq | Freq: Once | ORAL | Status: AC
  Administered 2014-04-15: 40 meq via ORAL
  Filled 2014-04-15: qty 30

## 2014-04-15 MED ORDER — VANCOMYCIN HCL IN DEXTROSE 1-5 GM/200ML-% IV SOLN
1000.0000 mg | Freq: Once | INTRAVENOUS | Status: AC
  Administered 2014-04-15: 1000 mg via INTRAVENOUS
  Filled 2014-04-15: qty 200

## 2014-04-15 MED ORDER — IRBESARTAN 300 MG PO TABS
300.0000 mg | ORAL_TABLET | Freq: Every day | ORAL | Status: DC
  Administered 2014-04-16 – 2014-04-18 (×3): 300 mg via ORAL
  Filled 2014-04-15 (×3): qty 1

## 2014-04-15 MED ORDER — PIPERACILLIN-TAZOBACTAM 3.375 G IVPB: 3.3750 g | Freq: Once | INTRAVENOUS | Status: DC

## 2014-04-15 MED ORDER — ENOXAPARIN SODIUM 40 MG/0.4ML ~~LOC~~ SOLN: 40.0000 mg | SUBCUTANEOUS | Status: DC

## 2014-04-15 MED ORDER — VALSARTAN-HYDROCHLOROTHIAZIDE 320-25 MG PO TABS: 1.0000 | ORAL_TABLET | Freq: Every day | ORAL | Status: DC

## 2014-04-15 MED ORDER — ACETAMINOPHEN 325 MG PO TABS
650.0000 mg | ORAL_TABLET | Freq: Once | ORAL | Status: AC
  Administered 2014-04-15: 650 mg via ORAL
  Filled 2014-04-15: qty 2

## 2014-04-15 MED ORDER — MELATONIN 5 MG PO TABS: 1.0000 | ORAL_TABLET | Freq: Every evening | ORAL | Status: DC | PRN

## 2014-04-15 MED ORDER — DOCUSATE SODIUM 100 MG PO CAPS
100.0000 mg | ORAL_CAPSULE | Freq: Two times a day (BID) | ORAL | Status: DC
  Administered 2014-04-16 – 2014-04-17 (×5): 100 mg via ORAL

## 2014-04-15 MED ORDER — HYDROCHLOROTHIAZIDE 25 MG PO TABS
25.0000 mg | ORAL_TABLET | Freq: Every day | ORAL | Status: DC
  Administered 2014-04-16 – 2014-04-18 (×3): 25 mg via ORAL
  Filled 2014-04-15 (×3): qty 1

## 2014-04-15 MED ORDER — AMLODIPINE BESYLATE 5 MG PO TABS
5.0000 mg | ORAL_TABLET | Freq: Every day | ORAL | Status: DC
  Administered 2014-04-16 – 2014-04-18 (×3): 5 mg via ORAL
  Filled 2014-04-15 (×3): qty 1

## 2014-04-15 MED ORDER — VANCOMYCIN HCL IN DEXTROSE 1-5 GM/200ML-% IV SOLN
1000.0000 mg | Freq: Once | INTRAVENOUS | Status: AC
  Administered 2014-04-16: 1000 mg via INTRAVENOUS
  Filled 2014-04-15: qty 200

## 2014-04-15 MED ORDER — VANCOMYCIN HCL IN DEXTROSE 1-5 GM/200ML-% IV SOLN
1000.0000 mg | Freq: Two times a day (BID) | INTRAVENOUS | Status: DC
  Administered 2014-04-16 – 2014-04-17 (×4): 1000 mg via INTRAVENOUS
  Filled 2014-04-15 (×5): qty 200

## 2014-04-15 MED ORDER — ACETAMINOPHEN 325 MG PO TABS
650.0000 mg | ORAL_TABLET | Freq: Four times a day (QID) | ORAL | Status: DC | PRN
  Administered 2014-04-16 – 2014-04-17 (×2): 650 mg via ORAL
  Filled 2014-04-15 (×2): qty 2

## 2014-04-15 MED ORDER — CEFEPIME HCL 2 G IJ SOLR
2.0000 g | Freq: Three times a day (TID) | INTRAMUSCULAR | Status: DC
  Administered 2014-04-15 – 2014-04-18 (×8): 2 g via INTRAVENOUS
  Filled 2014-04-15 (×10): qty 2

## 2014-04-15 MED ORDER — SODIUM CHLORIDE 0.9 % IV BOLUS (SEPSIS)
1000.0000 mL | Freq: Once | INTRAVENOUS | Status: AC
  Administered 2014-04-15: 1000 mL via INTRAVENOUS

## 2014-04-15 NOTE — ED Provider Notes (Signed)
See prior note   Janice Norrie, MD 04/15/14 2231

## 2014-04-15 NOTE — ED Notes (Signed)
Provided patient a cup of ice water, apple juice, and ham sandwich by permission from admitting physician.

## 2014-04-15 NOTE — H&P (Signed)
[facility] History and Physical  Monica [name] WFU:[id number] DOB: [birth year] DOA: 04/15/2014  Referring physician:  PCP: Vena Austria, MD  Specialists:  Chief Complaint: Fever of 1 day duration  HPI: Monica [name] is a 68 y.o. pleasant female with history of hypertension and newly diagnosed left breast cancer in July 2015 status post lumpectomy, now on CHEMOTHERAPY with doxorubicin and cyclophosphamide. Patient came to the emergency department today with complaints of general denies body aches, malaise, fever 101.43F, and the flulike symptoms. Patient's last chemotherapy was Monday (6 days ago), this is the third cycle, she started feeling weak and tired on Friday and today her temperature was 101.43F, she complained of some nasal congestion, sore throat and dry cough. She denies any chest pain or shortness of breath, she denies any pain or redness around the Mediport insertion site. She does have some headaches but relief with Tylenol on this time she is headache free. She denies bone pain. She does have some urinary frequency and urgency, but she denies dysuria, no hematuria. She had total abdominal hysterectomy in 1992. In the emergency department, she was found to have white cell count of 1.1 neutrophil count of 69%, Potassium was 3.1% which was replaced in the ED. Urinalysis is normal and her chest x-ray shows no acute cardiopulmonary disease.  General: The patient denies anorexia, weight loss Cardiac: Denies chest pain, syncope, palpitations, pedal edema  Respiratory: Denies cough, shortness of breath, wheezing GI: Denies severe indigestion/heartburn, abdominal pain, nausea, vomiting, diarrhea and constipation GU: Denies hematuria, incontinence, dysuria  Musculoskeletal: Denies arthritis  Skin: Denies suspicious skin lesions Neurologic: Denies focal weakness or numbness, change in vision  Past Medical History  Diagnosis Date  . Allergy codeine    rapid heart  beat, cold sweat  . Arthritis   . Breast cancer   . Hypertension     Past Surgical History  Procedure Laterality Date  . Knee arthroscopy  2012    right  . Lumbar laminectomy  1991  . Colonoscopy    . Foot arthroplasty  1998    right  . Abdominal hysterectomy  1992  . Breast lumpectomy with needle localization and axillary sentinel lymph node bx Left 02/12/2014    Procedure: LEFT BREAST LUMPECTOMY WITH NEEDLE LOCALIZATION AND LEFT AXILLARY SENTINEL LYMPH NODE BX;  Surgeon: [name], MD;  Location: [place];  Service: General;  Laterality: Left;  . Portacath placement Right 02/12/2014    Procedure: INSERTION PORT-A-CATH;  Surgeon: [name], MD;  Location: [place];  Service: General;  Laterality: Right;    Social History: She does not drink alcohol, she does not smoke cigarettes. Lives at home   Allergies  Allergen Reactions  . Codeine     Heart fast    Family History  Problem Relation Age of Onset  . Cancer Sister   . Cancer Brother   . Cancer Maternal Aunt   . Cancer Paternal Aunt       Prior to Admission medications   Medication Sig Start Date End Date Taking? Authorizing Provider  Alum & Mag Hydroxide-Simeth (MAGIC MOUTHWASH) SOLN Take 5-10 mLs by mouth 4 (four) times daily.   Yes Historical Provider, MD  amLODipine (NORVASC) 5 MG tablet Take 5 mg by mouth daily.   Yes Historical Provider, MD  Cholecalciferol (VITAMIN D3) 2000 UNITS TABS Take 1 tablet by mouth daily.   Yes Historical Provider, MD  dexamethasone (DECADRON) 4 MG tablet Take 4-8 mg by  mouth 2 (two) times daily. Take one tablet the day after chemo and 2 tabs for 4 days after   Yes Historical Provider, MD  docusate sodium (COLACE) 100 MG capsule Take 100 mg by mouth 2 (two) times daily.   Yes Historical Provider, MD  Ginkgo Biloba 120 MG CAPS Take 1 capsule by mouth daily.   Yes Historical Provider, MD  HYDROcodone-acetaminophen (NORCO/VICODIN) 5-325  MG per tablet Take 1 tablet by mouth every 4 (four) hours as needed for moderate pain.  03/02/14  Yes Historical Provider, MD  ibuprofen (ADVIL,MOTRIN) 200 MG tablet Take 400 mg by mouth daily.   Yes Historical Provider, MD  lidocaine-prilocaine (EMLA) cream Apply 1 application topically as needed. Apply over port site 1-2 hours before chemo and cover with plastic wrap 02/16/14  Yes Chauncey Cruel, MD  LORazepam (ATIVAN) 0.5 MG tablet Take 1 tablet (0.5 mg total) by mouth at bedtime as needed (Nausea or vomiting). 04/09/14  Yes Marcelino Duster, NP  Melatonin 5 MG TABS Take 1 tablet by mouth at bedtime as needed (sleep).   Yes Historical Provider, MD  nystatin-triamcinolone ointment Lilyan Gilford)  01/01/14  Yes Historical Provider, MD  ondansetron (ZOFRAN) 8 MG tablet Take 1 tablet (8 mg total) by mouth 2 (two) times daily as needed. Start on the third day after chemotherapy. 02/16/14  Yes Chauncey Cruel, MD  oxyCODONE (OXY IR/ROXICODONE) 5 MG immediate release tablet Take 1-2 tablets (5-10 mg total) by mouth every 4 (four) hours as needed for moderate pain, severe pain or breakthrough pain. 02/12/14  Yes Excell Seltzer, MD  prochlorperazine (COMPAZINE) 10 MG tablet Take 1 tablet (10 mg total) by mouth every 6 (six) hours as needed (Nausea or vomiting). 04/02/14  Yes Minette Headland, NP  UNABLE TO FIND Take 3 tablets by mouth 2 (two) times daily. OMEGA XL   Yes Historical Provider, MD  valsartan-hydrochlorothiazide (DIOVAN-HCT) 320-25 MG per tablet Take 1 tablet by mouth daily.   Yes Historical Provider, MD     Physical Exam: Filed Vitals:   04/15/14 1548 04/15/14 1726 04/15/14 1958 04/15/14 2119  BP: 139/69 140/71 129/69 110/71  Pulse: 103 93 93 94  Temp: 100.4 F (38 C)  99 F (37.2 C) 99.4 F (37.4 C)  TempSrc: Oral  Oral Oral  Resp: 20 18 18 18   SpO2: 96% 97% 95% 95%     General: Very pleasant middle-aged woman, febrile to touch, not pale and anicteric, not in respiratory  distress HEENT: Normocephalic and Atraumatic, Mucous membranes pink                PERRLA; EOM intact,                 Nares: Patent, Oropharynx: Clear, Fair Dentition                 Neck: FROM, no cervical lymphadenopathy, thyromegaly, carotid bruit or JVD;  Breasts: deferred CHEST WALL: MediPort in situ CHEST: Normal respiration, clear to auscultation bilaterally  HEART: Regular rate and rhythm; no murmurs rubs or gallops  BACK: No kyphosis or scoliosis; no CVA tenderness  ABDOMEN: Positive Bowel Sounds, soft, non-tender; no masses, no organomegaly Rectal Exam: deferred EXTREMITIES: No cyanosis, clubbing, or edema Genitalia: not examined  SKIN:  no rash or ulceration  CNS: Alert and Oriented x 4, Nonfocal exam, CN 2-12 intact  Labs on Admission:  Basic Metabolic Panel:  Recent Labs Lab 04/09/14 0818 04/15/14 1653  NA 141 134*  K 3.8  3.1*  CL  --  95*  CO2 27 26  GLUCOSE 114 235*  BUN 12.2 15  CREATININE 0.9 0.77  CALCIUM 9.9 9.2   Liver Function Tests:  Recent Labs Lab 04/09/14 0818 04/15/14 1653  AST 17 12  ALT 25 17  ALKPHOS 92 111  BILITOT 0.20 0.6  PROT 6.8 6.4  ALBUMIN 3.2* 2.9*   No results found for this basename: LIPASE, AMYLASE,  in the last 168 hours No results found for this basename: AMMONIA,  in the last 168 hours CBC:  Recent Labs Lab 04/09/14 0818 04/15/14 1653  WBC 9.7 1.1*  NEUTROABS 7.5* 0.8*  HGB 11.7 10.9*  HCT 36.2 32.6*  MCV 86.2 84.2  PLT 251 110*   Cardiac Enzymes: No results found for this basename: CKTOTAL, CKMB, CKMBINDEX, TROPONINI,  in the last 168 hours  BNP (last 3 results) No results found for this basename: PROBNP,  in the last 8760 hours CBG: No results found for this basename: GLUCAP,  in the last 168 hours  Radiological Exams on Admission: Dg Chest 2 View  04/15/2014   CLINICAL DATA:  Fever. Flu like symptoms. Receiving chemotherapy for breast cancer.  EXAM: CHEST  2 VIEW  COMPARISON:  Chest radiograph  02/12/2014  FINDINGS: Heart size is mildly prominent. Thoracic aorta and hilar contours are stable and normal. There is a right IJ power Port-A-Cath which terminates in the mid superior vena cava. Pulmonary vascularity is normal. The lungs are normally expanded and clear. Negative for airspace disease or pleural effusion. Trachea midline. No acute or suspicious bony abnormality. Upper abdomen unremarkable.  IMPRESSION: No active cardiopulmonary disease.   Electronically Signed   By: Curlene Dolphin M.D.   On: 04/15/2014 16:56    EKG: Independently reviewed.  Assessment/Plan Principal Problem:   Febrile neutropenia Active Problems:   Breast cancer of lower-outer quadrant of left female breast   Hypokalemia   Hypertension  Febrile neutropenia:   IV fluid  Obtain blood culture, urine culture,   IV antibiotics vancomycin and cefepime  Repeat CBC and differential in the morning  Neutropenic precautions  Tylenol when necessary for fever  Monitor very closely  Hypertension: Controlled  Continue home medications  Heart healthy diet  Hypokalemia:  Replaced  Repeat BMP in the morning  Breast cancer status post lumpectomy On CHEMOTHERAPY, third cycle complicated 7 days ago  Consulted: Possible Infectious Disease  Code Status: Full Family Communication: Discussed in detail with daughter at the bedside DVT Prophylaxis: Lovenox  Time spent: 90 min  Kian Gamarra, MD [facility]  If 7PM-7AM, please contact night-coverage [email or web address] 04/15/2014, 10:21 PM

## 2014-04-15 NOTE — ED Provider Notes (Signed)
CSN: [id number]     Arrival date & time 04/15/14  1538 History   First MD Initiated Contact with Patient 04/15/14 1543     Chief Complaint  Patient presents with  . Fever     (Consider location/radiation/quality/duration/timing/severity/associated sxs/prior Treatment) HPI Monica [name] is a 68 y.o. female history of hypertension and newly diagnosed breast cancer. Presents to emergency department with complaint of generalized malaise, fever, upper respiratory tract symptoms. Patient is currently on chemotherapy, last treatment 6 days ago. This was her third treatment. patient states her symptoms began 2 days ago. states having nasal congestion, sore throat, dry cough. Denies any chest pain or shortness of breath.denies any neck pain or stiffness, however is having headaches. She states she did not take her temperature until today, but has had chills for the last 2 days. Today when she checked her temperature her temperature was  101.5. She has not treated herself for the temperature. Patient denies any abdominal pain. No nausea or vomiting. Does admit to lower back pain. Admits to urinary frequency and mild stress incontinence. No diarrhea.  Past Medical History  Diagnosis Date  . Allergy codeine    rapid heart beat, cold sweat  . Arthritis   . Breast cancer   . Hypertension    Past Surgical History  Procedure Laterality Date  . Knee arthroscopy  2012    right  . Lumbar laminectomy  1991  . Colonoscopy    . Foot arthroplasty  1998    right  . Abdominal hysterectomy  1992  . Breast lumpectomy with needle localization and axillary sentinel lymph node bx Left 02/12/2014    Procedure: LEFT BREAST LUMPECTOMY WITH NEEDLE LOCALIZATION AND LEFT AXILLARY SENTINEL LYMPH NODE BX;  Surgeon: [name], MD;  Location: [place];  Service: General;  Laterality: Left;  . Portacath placement Right 02/12/2014    Procedure: INSERTION PORT-A-CATH;  Surgeon: [name], MD;  Location: [place];  Service: General;  Laterality: Right;   Family History  Problem Relation Age of Onset  . Cancer Sister   . Cancer Brother   . Cancer Maternal Aunt   . Cancer Paternal Aunt    History  Substance Use Topics  . Smoking status: Never Smoker   . Smokeless tobacco: Not on file  . Alcohol Use: No   OB History   Grav Para Term Preterm Abortions TAB SAB Ect Mult Living                 Review of Systems  Constitutional: Positive for fever and chills.  Respiratory: Negative for cough, chest tightness and shortness of breath.   Cardiovascular: Negative for chest pain, palpitations and leg swelling.  Gastrointestinal: Positive for abdominal pain. Negative for nausea, vomiting and diarrhea.  Genitourinary: Negative for dysuria, flank pain and pelvic pain.  Musculoskeletal: Positive for back pain and myalgias. Negative for neck pain and neck stiffness.  Skin: Negative for rash.  Neurological: Positive for headaches. Negative for dizziness and weakness.  All other systems reviewed and are negative.     Allergies  Codeine  Home Medications   Prior to Admission medications   Medication Sig Start Date End Date Taking? Authorizing Provider  amLODipine (NORVASC) 5 MG tablet Take 5 mg by mouth daily.    Historical Provider, MD  Cholecalciferol (VITAMIN D3) 2000 UNITS TABS Take 1 tablet by mouth daily.    Historical Provider, MD  dexamethasone (DECADRON) 4 MG tablet Take 2  tablets by mouth once a day on the day after chemotherapy and then take 2 tablets two times a day for 2 days. Take with food. 04/09/14   Marcelino Duster, NP  Diphenhyd-Hydrocort-Nystatin (FIRST-DUKES MOUTHWASH) SUSP 5-10 ml qid PRN SWISH SWALLOW OR SPIT 04/02/14   Minette Headland, NP  docusate sodium (COLACE) 100 MG capsule Take 100 mg by mouth 2 (two) times daily.    Historical Provider, MD  fluconazole (DIFLUCAN) 100 MG tablet Take 1 tablet (100 mg total) by mouth  daily. 03/20/14   Chauncey Cruel, MD  Ginkgo Biloba 120 MG CAPS Take 1 capsule by mouth daily.    Historical Provider, MD  HYDROcodone-acetaminophen (NORCO/VICODIN) 5-325 MG per tablet  03/02/14   Historical Provider, MD  lidocaine-prilocaine (EMLA) cream Apply 1 application topically as needed. Apply over port site 1-2 hours before chemo and cover with plastic wrap 02/16/14   Chauncey Cruel, MD  LORazepam (ATIVAN) 0.5 MG tablet Take 1 tablet (0.5 mg total) by mouth at bedtime as needed (Nausea or vomiting). 04/09/14   Marcelino Duster, NP  meloxicam (MOBIC) 15 MG tablet Take 15 mg by mouth daily as needed for pain.    Historical Provider, MD  nystatin-triamcinolone ointment Lilyan Gilford)  01/01/14   Historical Provider, MD  ondansetron (ZOFRAN) 8 MG tablet Take 1 tablet (8 mg total) by mouth 2 (two) times daily as needed. Start on the third day after chemotherapy. 02/16/14   Chauncey Cruel, MD  oxyCODONE (OXY IR/ROXICODONE) 5 MG immediate release tablet Take 1-2 tablets (5-10 mg total) by mouth every 4 (four) hours as needed for moderate pain, severe pain or breakthrough pain. 02/12/14   Excell Seltzer, MD  prochlorperazine (COMPAZINE) 10 MG tablet Take 1 tablet (10 mg total) by mouth every 6 (six) hours as needed (Nausea or vomiting). 04/02/14   Minette Headland, NP  UNABLE TO FIND Take 3 tablets by mouth 2 (two) times daily. OMEGA XL    Historical Provider, MD  valsartan-hydrochlorothiazide (DIOVAN-HCT) 320-25 MG per tablet Take 1 tablet by mouth daily.    Historical Provider, MD   BP 139/69  Pulse 103  Temp(Src) 100.4 F (38 C) (Oral)  Resp 20  SpO2 96% Physical Exam  Nursing note and vitals reviewed. Constitutional: She is oriented to person, place, and time. She appears well-developed and well-nourished. No distress.  HENT:  Head: Normocephalic and atraumatic.  Right Ear: External ear normal.  Left Ear: External ear normal.  Mouth/Throat: Oropharynx is clear and moist.  Eyes:  Conjunctivae are normal.  Neck: Normal range of motion. Neck supple.  Cardiovascular: Normal rate, regular rhythm and normal heart sounds.   Pulmonary/Chest: Effort normal and breath sounds normal. No respiratory distress. She has no wheezes. She has no rales.  Abdominal: Soft. Bowel sounds are normal. She exhibits no distension. There is no tenderness. There is no rebound.  Musculoskeletal: She exhibits no edema.  Lymphadenopathy:    She has no cervical adenopathy.  Neurological: She is alert and oriented to person, place, and time.  Skin: Skin is warm and dry.  Psychiatric: She has a normal mood and affect. Her behavior is normal.    ED Course  Procedures (including critical care time) Labs Review Labs Reviewed  CBC WITH DIFFERENTIAL - Abnormal; Notable for the following:    WBC 1.1 (*)    Hemoglobin 10.9 (*)    HCT 32.6 (*)    Platelets 110 (*)    Neutro Abs 0.8 (*)  Lymphs Abs 0.3 (*)    Monocytes Absolute 0.0 (*)    All other components within normal limits  COMPREHENSIVE METABOLIC PANEL - Abnormal; Notable for the following:    Sodium 134 (*)    Potassium 3.1 (*)    Chloride 95 (*)    Glucose, Bld 235 (*)    Albumin 2.9 (*)    GFR calc non Af Amer 86 (*)    All other components within normal limits  URINALYSIS, ROUTINE W REFLEX MICROSCOPIC - Abnormal; Notable for the following:    APPearance CLOUDY (*)    All other components within normal limits  I-STAT CG4 LACTIC ACID, ED - Abnormal; Notable for the following:    Lactic Acid, Venous 2.36 (*)    All other components within normal limits  CULTURE, BLOOD (ROUTINE X 2)  CULTURE, BLOOD (ROUTINE X 2)  URINE CULTURE  INFLUENZA PANEL BY PCR (TYPE A & B, H1N1)    Imaging Review Dg Chest 2 View  04/15/2014   CLINICAL DATA:  Fever. Flu like symptoms. Receiving chemotherapy for breast cancer.  EXAM: CHEST  2 VIEW  COMPARISON:  Chest radiograph 02/12/2014  FINDINGS: Heart size is mildly prominent. Thoracic aorta and  hilar contours are stable and normal. There is a right IJ power Port-A-Cath which terminates in the mid superior vena cava. Pulmonary vascularity is normal. The lungs are normally expanded and clear. Negative for airspace disease or pleural effusion. Trachea midline. No acute or suspicious bony abnormality. Upper abdomen unremarkable.  IMPRESSION: No active cardiopulmonary disease.   Electronically Signed   By: Curlene Dolphin M.D.   On: 04/15/2014 16:56     EKG Interpretation None      MDM   Final diagnoses:  Fever  Neutropenic fever  URI (upper respiratory infection)    Patient on chemotherapy for breast cancer, immunocompromise, last treatment 6 days ago.here with fever, chills, URI symptoms. Generalized weakness. Blood labs, lactic acid, blood cultures, IV fluids and Tylenol ordered for her temperature. At this time mild tachycardia, temperature is100.4 here. Patient states temperature was 101.5 at home.   Pt's WBC 1.1. Lactic acid slightly elevated at 2.36. CXR and Urine clear. Influenza panel sent. Given neutropenic fever, antibiotics started.cefepime and vancomycin ordered.patient's blood pressure heart rate remained normal. She has no complaints at this time. Discussed with tried hospitalist will admit.  Filed Vitals:   04/15/14 1548 04/15/14 1726 04/15/14 1958  BP: 139/69 140/71 129/69  Pulse: 103 93 93  Temp: 100.4 F (38 C)  99 F (37.2 C)  TempSrc: Oral  Oral  Resp: 20 18 18   SpO2: 96% 97% 95%     Renold Genta, PA-C 04/15/14 2114

## 2014-04-15 NOTE — Progress Notes (Signed)
Rx Brief Lovenox note Rx adjusted Lovenox to 60mg  daily in pt with BMI =40 and Crcl~88 ml/min  Thanks, Dorrene German 04/15/2014 11:44 PM

## 2014-04-15 NOTE — Progress Notes (Signed)
PHARMACIST - PHYSICIAN ORDER COMMUNICATION  CONCERNING: P&T Medication Policy on Herbal Medications  DESCRIPTION:  This patient's order for: Melatonin  has been noted.  This product(s) is classified as an "herbal" or natural product. Due to a lack of definitive safety studies or FDA approval, nonstandard manufacturing practices, plus the potential risk of unknown drug-drug interactions while on inpatient medications, the Pharmacy and Therapeutics Committee does not permit the use of "herbal" or natural products of this type within [facility]   ACTION TAKEN: The pharmacy department is unable to verify this order at this time and your patient has been informed of this safety policy. Please reevaluate patient's clinical condition at discharge and address if the herbal or natural product(s) should be resumed at that time.    Dorrene German 04/15/2014 11:21 PM

## 2014-04-15 NOTE — ED Provider Notes (Signed)
Pt had chemotherapy 6 days ago. She reports fever up to 101.5 that started 2 days ago with right flank pain, no dysuria, + frequency with chills and headache.   Pt is awake and alert. She has right CVAT but no abdominal pain to palpation.   We discussed she is neutropenic with ANC less than 800 and would need admission.    Medical screening examination/treatment/procedure(s) were conducted as a shared visit with non-physician practitioner(s) and myself.  I personally evaluated the patient during the encounter.   EKG Interpretation None       Rolland Porter, MD, Abram Sander    Janice Norrie, MD 04/15/14 [phone]

## 2014-04-15 NOTE — Progress Notes (Signed)
ANTIBIOTIC CONSULT NOTE - INITIAL  Pharmacy Consult for Vacomycin Indication: Bacteremia/Febrile neutropenia  Allergies  Allergen Reactions  . Codeine     Heart fast    Patient Measurements: Height: 5\' 6"  (167.6 cm) Weight: 248 lb (112.492 kg) IBW/kg (Calculated) : 59.3   Vital Signs: Temp: 100.2 F (37.9 C) (10/25 2226) Temp Source: Oral (10/25 2226) BP: 140/68 mmHg (10/25 2226) Pulse Rate: 86 (10/25 2226) Intake/Output from previous day:   Intake/Output from this shift:    Labs:  Recent Labs  04/15/14 1653  WBC 1.1*  HGB 10.9*  PLT 110*  CREATININE 0.77   Estimated Creatinine Clearance: 88 ml/min (by C-G formula based on Cr of 0.77). No results found for this basename: VANCOTROUGH, VANCOPEAK, VANCORANDOM, GENTTROUGH, GENTPEAK, GENTRANDOM, TOBRATROUGH, TOBRAPEAK, TOBRARND, AMIKACINPEAK, AMIKACINTROU, AMIKACIN,  in the last 72 hours   Microbiology: No results found for this or any previous visit (from the past 720 hour(s)).  Medical History: Past Medical History  Diagnosis Date  . Allergy codeine    rapid heart beat, cold sweat  . Arthritis   . Breast cancer   . Hypertension     Medications:  Scheduled:  . [START ON 04/16/2014] amLODipine  5 mg Oral Daily  . ceFEPime (MAXIPIME) IV  2 g Intravenous 3 times per day  . [START ON 04/16/2014] cholecalciferol  2,000 Units Oral Daily  . docusate sodium  100 mg Oral BID  . enoxaparin (LOVENOX) injection  60 mg Subcutaneous QHS  . [START ON 04/16/2014] irbesartan  300 mg Oral Daily   And  . [START ON 04/16/2014] hydrochlorothiazide  25 mg Oral Daily  . vancomycin  1,000 mg Intravenous Once   Infusions:  . sodium chloride     Assessment: 20 yoF with breast Ca s/p chemo now with febrile neutropenia. Cefepime per MD and Vancomycin per Rx.   Goal of Therapy:  Vancomycin trough level 15-20 mcg/ml  Plan:   Vancomycin 1Gm x1 in ED ~ 2030 then additional 1Gm ~ 0000 = 2Gm load then 1Gm IV q12h  F/u  Scr/levels/cultures as needed  Lawana Pai R 04/15/2014,11:34 PM

## 2014-04-15 NOTE — ED Notes (Addendum)
Applied restriction bractlet to left wrist.

## 2014-04-15 NOTE — ED Notes (Signed)
Attempted to call report to Minette Headland, primary nurse for [phone]. She reports being in a contact room and will give 10 minutes before call back to give report.

## 2014-04-15 NOTE — ED Notes (Signed)
She c/o nagging, low-grade generalized h/a, plus "not feeling good"x 2-3 days.  She states she took her temperature earlier today and found it to be 101.  She is awake and alert and in no distress.

## 2014-04-16 DIAGNOSIS — I10 Essential (primary) hypertension: Secondary | ICD-10-CM

## 2014-04-16 DIAGNOSIS — R5081 Fever presenting with conditions classified elsewhere: Secondary | ICD-10-CM

## 2014-04-16 DIAGNOSIS — C50512 Malignant neoplasm of lower-outer quadrant of left female breast: Secondary | ICD-10-CM

## 2014-04-16 DIAGNOSIS — E876 Hypokalemia: Secondary | ICD-10-CM

## 2014-04-16 DIAGNOSIS — D709 Neutropenia, unspecified: Principal | ICD-10-CM

## 2014-04-16 LAB — COMPREHENSIVE METABOLIC PANEL
ALK PHOS: 90 U/L (ref 39–117)
ALT: 15 U/L (ref 0–35)
AST: 12 U/L (ref 0–37)
Albumin: 2.6 g/dL — ABNORMAL LOW (ref 3.5–5.2)
Anion gap: 11 (ref 5–15)
BILIRUBIN TOTAL: 0.5 mg/dL (ref 0.3–1.2)
BUN: 11 mg/dL (ref 6–23)
CHLORIDE: 97 meq/L (ref 96–112)
CO2: 25 meq/L (ref 19–32)
Calcium: 8.4 mg/dL (ref 8.4–10.5)
Creatinine, Ser: 0.66 mg/dL (ref 0.50–1.10)
GFR calc non Af Amer: >90 mL/min (ref >90)
GLUCOSE: 133 mg/dL — AB (ref 70–99)
POTASSIUM: 3.1 meq/L — AB (ref 3.7–5.3)
Sodium: 133 mEq/L — ABNORMAL LOW (ref 137–147)
TOTAL PROTEIN: 5.8 g/dL — AB (ref 6.0–8.3)

## 2014-04-16 LAB — CBC
HEMATOCRIT: 29.2 % — AB (ref 36.0–46.0)
Hemoglobin: 9.9 g/dL — ABNORMAL LOW (ref 12.0–15.0)
MCH: 28.1 pg (ref 26.0–34.0)
MCHC: 33.9 g/dL (ref 30.0–36.0)
MCV: 83 fL (ref 78.0–100.0)
Platelets: 115 10*3/uL — ABNORMAL LOW (ref 150–400)
RBC: 3.52 MIL/uL — ABNORMAL LOW (ref 3.87–5.11)
RDW: 15 % (ref 11.5–15.5)
WBC: 1.1 10*3/uL — CL (ref 4.0–10.5)

## 2014-04-16 LAB — INFLUENZA PANEL BY PCR (TYPE A & B)
H1N1 flu by pcr: NOT DETECTED
INFLBPCR: NEGATIVE
Influenza A By PCR: NEGATIVE

## 2014-04-16 MED ORDER — POTASSIUM CHLORIDE CRYS ER 20 MEQ PO TBCR
40.0000 meq | EXTENDED_RELEASE_TABLET | Freq: Once | ORAL | Status: AC
  Administered 2014-04-16: 40 meq via ORAL
  Filled 2014-04-16: qty 2

## 2014-04-16 MED ORDER — MAGIC MOUTHWASH
5.0000 mL | Freq: Four times a day (QID) | ORAL | Status: DC | PRN
  Administered 2014-04-16 – 2014-04-17 (×2): 5 mL via ORAL
  Filled 2014-04-16: qty 10

## 2014-04-16 NOTE — Progress Notes (Signed)
Utilization review completed.  

## 2014-04-16 NOTE — Consult Note (Signed)
[place]  Telephone:([phone]) [phone] [phone]     ID: HAIDY KACKLEY DOB: [birth year]  MR#: [id number]  QQI#:[id number]  Patient Care Team: Maury Dus, MD as PCP - General (Family Medicine) Excell Seltzer, MD as Consulting Physician (General Surgery) Chauncey Cruel, MD as Consulting Physician (Oncology) Marye Round, MD as Consulting Physician (Radiation Oncology) Maisie Fus, MD as Consulting Physician (Obstetrics and Gynecology) MD:  Day 8 cycle 3 chemotherapy  Day 2 ceftazidime   BREAST CANCER HISTORY: From the original intake note:  [facility] showing a possible mass in her left breast. On 01/05/2014 left diagnostic mammography and ultrasonography at the breast Center showed 2 adjacent poorly defined masses deep in the lower outer portion of the left breast. On physical exam these were palpable measuring approximately 3 cm altogether, at the 4:00 position 16 cm from the left nipple. There were no palpable left axillary lymph nodes. Ultrasound confirmed to and adjacent irregularly marginated hypoechoic masses in the area in question, measuring 1.5 and 1.0 cm respectively. The total area of by ultrasonography measured 2.6 cm. In addition, to left axillary lymph nodes showed mild cortical thickening in one of them showed loss of the normal fatty hilum.  On 01/10/2014 the patient underwent separate biopsies of both the left breast masses in question as well as the more suspicious lymph node. The final pathology ([id number]) showed the lymph node to be benign. (This was interpreted as concordant). Both of the breast masses, however, were positive for an invasive ductal carcinoma, which was HER-2 not amplified, with the signals ratio of 0.98 and the number per cell being 2.45. Estrogen and progesterone receptor determination is pending but looks negative at this point.  On 01/16/2014 the patient  underwent bilateral breast MRI the right breast was unremarkable. In the left breast lower outer quadrant there were 2 oval masses measuring 1.5 and 2.2 cm. Taken together they was approximately 4 cm of disease. There were no findings of concern in the right breast. In the left axilla there was a single mildly prominent lymph node. There was no suspicious internal mammary adenopathy.  The patient's subsequent history is as detailed below  INTERVAL HISTORY: Taydem received cycle 3 of cyclophosphamide/ doxorubicin 04/09/2014, with neulasta the next day. She did well initially but on 04/13/2014 started to feel "tired." She had no other symptoms initially. She continued to feel poorly but did not take her temperature until 04/15/2014, when she found it to be >101. She was evaluated in the ED and found to be neutropenic. She was admitted, with CXR clear and cultures so far negative.  REVIEW OF SYSTEMS: She has had a dull headache, not accompanied by visual changes, dizzyness or stiff neck. She has had frequence and urinary incontinence, which is new for her. The rest of a detailed ROS today was negative except as noted above.  PAST MEDICAL HISTORY: Past Medical History  Diagnosis Date  . Allergy codeine    rapid heart beat, cold sweat  . Arthritis   . Breast cancer   . Hypertension     PAST SURGICAL HISTORY: Past Surgical History  Procedure Laterality Date  . Knee arthroscopy  2012    right  . Lumbar laminectomy  1991  . Colonoscopy    . Foot arthroplasty  1998    right  . Abdominal hysterectomy  1992  . Breast lumpectomy with needle localization and axillary sentinel lymph node bx Left  02/12/2014    Procedure: LEFT BREAST LUMPECTOMY WITH NEEDLE LOCALIZATION AND LEFT AXILLARY SENTINEL LYMPH NODE BX;  Surgeon: [name], MD;  Location: [facility];  Service: General;  Laterality: Left;  . Portacath placement Right 02/12/2014    Procedure: INSERTION PORT-A-CATH;   Surgeon: [name], MD;  Location: [facility];  Service: General;  Laterality: Right;    FAMILY HISTORY Family History  Problem Relation Age of Onset  . Cancer Sister   . Cancer Brother   . Cancer Maternal Aunt   . Cancer Paternal Aunt   the patient's father died at the age of 54 from a stroke. The patient's mother died at the age of 45 from "multiple causes". The patient had 2 brothers and 2 sisters. The patient's sister was diagnosed with uterine cancer at the age of 56, and the patient's brother [name] had "bone cancer" and died in his 64s. He was treated at the [facility]. There is no history of breast or ovarian cancer in the family to the patient's knowledge.   GYNECOLOGIC HISTORY:  No LMP recorded. Patient has had a hysterectomy. Menarche age 45, the patient is GX P0. She underwent hysterectomy in 1992, with no salpingo-oophorectomy. She has been on estrogen replacement since. 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 Her husband [name] worked for [employer] in [place] as a Facilities manager. The patient's adopted son [name] works as a laborer in [place]. The patient's god-daughter [name] is an Optometrist. The patient has no grandchildren. The patient attends a local church of [employer], and her husband [name] is pastor     ADVANCED DIRECTIVES: in place   HEALTH MAINTENANCE: History  Substance Use Topics  . Smoking status: Never Smoker   . Smokeless tobacco: Never Used  . Alcohol Use: No     Allergies  Allergen Reactions  . Codeine     Heart fast    Current Facility-Administered Medications  Medication Dose Route Frequency Provider Last Rate Last Dose  . 0.9 %  sodium chloride infusion   Intravenous Continuous Tresa Garter, MD 125 mL/hr at 04/16/14 0050    . acetaminophen (TYLENOL) tablet 650 mg   650 mg Oral Q6H PRN Tresa Garter, MD   650 mg at 04/16/14 0518  . amLODipine (NORVASC) tablet 5 mg  5 mg Oral Daily Olugbemiga E Jegede, MD      . ceFEPIme (MAXIPIME) 2 g in dextrose 5 % 50 mL IVPB  2 g Intravenous 3 times per day Tatyana A Kirichenko, PA-C 100 mL/hr at 04/16/14 0518 2 g at 04/16/14 0518  . cholecalciferol (VITAMIN D) tablet 2,000 Units  2,000 Units Oral Daily Tresa Garter, MD   2,000 Units at 04/16/14 1100  . docusate sodium (COLACE) capsule 100 mg  100 mg Oral BID Tresa Garter, MD   100 mg at 04/16/14 1100  . enoxaparin (LOVENOX) injection 60 mg  60 mg Subcutaneous QHS Tresa Garter, MD   60 mg at 04/16/14 0050  . irbesartan (AVAPRO) tablet 300 mg  300 mg Oral Daily Tresa Garter, MD   300 mg at 04/16/14 1100   And  . hydrochlorothiazide (HYDRODIURIL) tablet 25 mg  25 mg Oral Daily Tresa Garter, MD   25 mg at 04/16/14 1100  . ondansetron (ZOFRAN) tablet 4 mg  4 mg  Oral Q6H PRN Tresa Garter, MD       Or  . ondansetron (ZOFRAN) injection 4 mg  4 mg Intravenous Q6H PRN Tresa Garter, MD      . vancomycin (VANCOCIN) IVPB 1000 mg/200 mL premix  1,000 mg Intravenous Q12H Dorrene German, RPH   1,000 mg at 04/16/14 1100    OBJECTIVE: middle aged [place] American woman examined in bed Filed Vitals:   04/16/14 0542  BP: 131/65  Pulse: 87  Temp: 101.7 F (38.7 C)  Resp: 19     Body mass index is 40.05 kg/(m^2).    ECOG FS:2 - Symptomatic, <50% confined to bed  Ocular: Sclerae unicteric, EOMs intact Ear-nose-throat: Oropharynx clear Lymphatic: No cervical or supraclavicular adenopathy Lungs no rales or rhonchi,auscultated anterolaterally Heart regular rate and rhythm Abd soft, obese, nontender, positive bowel sounds MSK no focal spinal tenderness, no joint edema Neuro: non-focal, well-oriented, appropriate affect Breasts: deferred   LAB RESULTS:  CMP     Component Value Date/Time   NA 133* 04/16/2014 0515   NA  141 04/09/2014 0818   K 3.1* 04/16/2014 0515   K 3.8 04/09/2014 0818   CL 97 04/16/2014 0515   CO2 25 04/16/2014 0515   CO2 27 04/09/2014 0818   GLUCOSE 133* 04/16/2014 0515   GLUCOSE 114 04/09/2014 0818   BUN 11 04/16/2014 0515   BUN 12.2 04/09/2014 0818   CREATININE 0.66 04/16/2014 0515   CREATININE 0.9 04/09/2014 0818   CALCIUM 8.4 04/16/2014 0515   CALCIUM 9.9 04/09/2014 0818   PROT 5.8* 04/16/2014 0515   PROT 6.8 04/09/2014 0818   ALBUMIN 2.6* 04/16/2014 0515   ALBUMIN 3.2* 04/09/2014 0818   AST 12 04/16/2014 0515   AST 17 04/09/2014 0818   ALT 15 04/16/2014 0515   ALT 25 04/09/2014 0818   ALKPHOS 90 04/16/2014 0515   ALKPHOS 92 04/09/2014 0818   BILITOT 0.5 04/16/2014 0515   BILITOT 0.20 04/09/2014 0818   GFRNONAA >90 04/16/2014 0515   GFRAA >90 04/16/2014 0515    I No results found for this basename: SPEP, UPEP,  kappa and lambda light chains    Lab Results  Component Value Date   WBC 1.1* 04/16/2014   NEUTROABS 0.8* 04/15/2014   HGB 9.9* 04/16/2014   HCT 29.2* 04/16/2014   MCV 83.0 04/16/2014   PLT 115* 04/16/2014    _0 @  No results found for this basename: LABCA2    No components found with this basename: LABCA125    No results found for this basename: INR,  in the last 168 hours  Urinalysis    Component Value Date/Time   COLORURINE YELLOW 04/15/2014 1814   APPEARANCEUR CLOUDY* 04/15/2014 1814   LABSPEC 1.017 04/15/2014 1814   PHURINE 7.0 04/15/2014 1814   GLUCOSEU NEGATIVE 04/15/2014 1814   HGBUR NEGATIVE 04/15/2014 1814   BILIRUBINUR NEGATIVE 04/15/2014 1814   KETONESUR NEGATIVE 04/15/2014 1814   PROTEINUR NEGATIVE 04/15/2014 1814   UROBILINOGEN 1.0 04/15/2014 1814   NITRITE NEGATIVE 04/15/2014 1814   LEUKOCYTESUR NEGATIVE 04/15/2014 1814    STUDIES: Dg Chest 2 View  04/15/2014   CLINICAL DATA:  Fever. Flu like symptoms. Receiving chemotherapy for breast cancer.  EXAM: CHEST  2 VIEW  COMPARISON:  Chest radiograph  02/12/2014  FINDINGS: Heart size is mildly prominent. Thoracic aorta and hilar contours are stable and normal. There is a right IJ power Port-A-Cath which terminates in the mid superior vena cava. Pulmonary vascularity is normal. The lungs are  normally expanded and clear. Negative for airspace disease or pleural effusion. Trachea midline. No acute or suspicious bony abnormality. Upper abdomen unremarkable.  IMPRESSION: No active cardiopulmonary disease.   Electronically Signed   By: Curlene Dolphin M.D.   On: 04/15/2014 16:56     ASSESSMENT: 68 y.o. [place] woman currently day 8 cycle 3 chemotherapy, admitted 04/15/2014 with febrile neutropenia, likely urinary source  (0) status post left breast biopsy of 2 separate lower outer quadrant masses 01/10/2014 for a multifocal cT2 cNX, stage II invasive ductal carcinoma, grade 3, with an MIB-1 of 90%, HER-2 negative, estrogen receptors 3% positive with weak staining, progesterone receptor negative, with an MIB-1 of 90%; this is clinically a "triple-negative" breast cancer and will be treated as such  (1) a suspicious left axillary lymph node biopsied 01/10/2014 was negative (concordant)  (2) left lumpectomy and sentinel lymph node sampling 02/12/2014 showed an mpT2 pN0, stage IIA invasive ductal carcinoma grade 3, repeat HER-2 pending  (3) adjuvant chemotherapy starting 03/12/2014 to consist of dose dense doxorubicin and cyclophosphamide x4, with Neulasta support, followed by weekly paclitaxel and carboplatin x12  (4) adjuvant radiation to follow chemotherapy  PLAN: Patient received neulasta 04/10/2014 and I anticipate her ANC will rise >1000 within 24-48 hours. It is not unusual for fever to take 24-72 hours to resolve in these cases and so long as she is clinically stable, as is the case, I would not change or add antibiotics.  She was supposed to be treated again a week from today. We will keep that appointment but her chemotherapy will be delayed until  a week after that and her dose will be cut by 20%.  I discussed all this with Clarabel, who has a good understanding of the overall plan. She agrees with it. She knows the goal of treatment in her case is cure. I will follow with you while hospitalized.  Appreciate your excellent care to this patient!  Chauncey Cruel, MD   04/16/2014 1:26 PM

## 2014-04-16 NOTE — Progress Notes (Signed)
[facility]                                                                              Patient Demographics  Monica [name], is a 68 y.o. female, DOB - 01-Jan-1946, LKG:[id number]  Admit date - 04/15/2014   Admitting Physician Tresa Garter, MD  Outpatient Primary MD for the patient is Vena Austria, MD  LOS - 1   Chief Complaint  Patient presents with  . Fever      Interim history 68 year old female with a history of left breast cancer in 2015 currently on chemotherapy presented with body aches, malaise, fever, flulike symptoms. Patient was admitted for neutropenic fever and started on broad-spectrum antibiotics. Workup still pending. Patient is followed by Dr. Marjie Skiff.  Assessment & Plan   Neutropenic fever -Patient started on broad-spectrum antibiotics with vancomycin and cefepime -Likely secondary to recent Neulasta therapy on 04/10/2014. -CXR: No active cardiopulmonary disease -UA also negative for infection -Blood cultures pending -Oncology consultation appreciated. -Oncology, and is not unusual for fever to take 24-72 hours to resolve. -Will continue antibiotics, Tylenol for fever.  Breast cancer -Oncology consultation appreciated -Patient is scheduled for chemotherapy in 7 days, however this will be postponed.  Hypertension -Continue home medications.  Hypokalemia -Will replace and continue to monitor  Pancytopenia -Likely secondary to chemotherapy -Continue monitor CBC  Code Status: Full  Family Communication: None at bedside  Disposition Plan: Admitted  Time Spent in minutes   30 minutes  Procedures  None  Consults   Oncology  DVT Prophylaxis  Lovenox  Lab Results  Component Value Date   PLT 115* 04/16/2014    Medications  Scheduled Meds: . amLODipine  5 mg Oral Daily  . ceFEPime (MAXIPIME) IV  2 g Intravenous 3 times per day  . cholecalciferol  2,000 Units Oral Daily  . docusate sodium  100 mg Oral BID  .  enoxaparin (LOVENOX) injection  60 mg Subcutaneous QHS  . irbesartan  300 mg Oral Daily   And  . hydrochlorothiazide  25 mg Oral Daily  . vancomycin  1,000 mg Intravenous Q12H   Continuous Infusions: . sodium chloride 125 mL/hr at 04/16/14 0050   PRN Meds:.acetaminophen, ondansetron (ZOFRAN) IV, ondansetron  Antibiotics    Anti-infectives   Start     Dose/Rate Route Frequency Ordered Stop   04/16/14 1000  vancomycin (VANCOCIN) IVPB 1000 mg/200 mL premix     1,000 mg 200 mL/hr over 60 Minutes Intravenous Every 12 hours 04/15/14 2340     04/15/14 2330  vancomycin (VANCOCIN) IVPB 1000 mg/200 mL premix     1,000 mg 200 mL/hr over 60 Minutes Intravenous  Once 04/15/14 2301 04/16/14 0150   04/15/14 2200  ceFEPIme (MAXIPIME) 2 g in dextrose 5 % 50 mL IVPB     2 g 100 mL/hr over 30 Minutes Intravenous 3 times per day 04/15/14 1803     04/15/14 1800  vancomycin (VANCOCIN) IVPB 1000 mg/200 mL premix     1,000 mg 200 mL/hr over 60 Minutes Intravenous  Once 04/15/14 1758 04/15/14 2055   04/15/14 1800  piperacillin-tazobactam (ZOSYN) IVPB 3.375 g  Status:  Discontinued     3.375 g 12.5  mL/hr over 240 Minutes Intravenous  Once 04/15/14 1758 04/15/14 1802        Subjective:   Monica [name] seen and examined today.  Patient denies any cough or shortness of breath, Denies any chest pain, abdominal pain, recent ill contacts or travel. Patient states that she no longer feels feverish. She would like to postpone her upcoming chemotherapy for another week.  Objective:   Filed Vitals:   04/15/14 2226 04/15/14 2302 04/16/14 0542 04/16/14 1357  BP: 140/68  131/65 160/74  Pulse: 86  87 93  Temp: 100.2 F (37.9 C)  101.7 F (38.7 C) 99.8 F (37.7 C)  TempSrc: Oral  Oral Oral  Resp: 18  19 18   Height:  5\' 6"  (1.676 m)    Weight:  112.492 kg (248 lb)    SpO2: 98%  98% 99%    Wt Readings from Last 3 Encounters:  04/15/14 112.492 kg (248 lb)  04/03/14 114.216 kg (251 lb 12.8 oz)    04/02/14 113.127 kg (249 lb 6.4 oz)     Intake/Output Summary (Last 24 hours) at 04/16/14 1429 Last data filed at 04/16/14 1300  Gross per 24 hour  Intake   1360 ml  Output    800 ml  Net    560 ml    Exam  General: Well developed, well nourished, NAD, appears stated age  HEENT: NCAT, PERRLA, EOMI, Anicteic Sclera, mucous membranes moist.   Cardiovascular: S1 S2 auscultated, no rubs, murmurs or gallops. Regular rate and rhythm.  Respiratory: Clear to auscultation bilaterally with equal chest rise  Abdomen: Soft, nontender, nondistended, + bowel sounds  Extremities: warm dry without cyanosis clubbing or edema  Neuro: AAOx3, cranial nerves grossly intact. Strength 5/5 in patient's upper and lower extremities bilaterally  Skin: Without rashes exudates or nodules  Psych: Normal affect and demeanor with intact judgement and insight  Data Review   Micro Results No results found for this or any previous visit (from the past 240 hour(s)).  Radiology Reports Dg Chest 2 View  04/15/2014   CLINICAL DATA:  Fever. Flu like symptoms. Receiving chemotherapy for breast cancer.  EXAM: CHEST  2 VIEW  COMPARISON:  Chest radiograph 02/12/2014  FINDINGS: Heart size is mildly prominent. Thoracic aorta and hilar contours are stable and normal. There is a right IJ power Port-A-Cath which terminates in the mid superior vena cava. Pulmonary vascularity is normal. The lungs are normally expanded and clear. Negative for airspace disease or pleural effusion. Trachea midline. No acute or suspicious bony abnormality. Upper abdomen unremarkable.  IMPRESSION: No active cardiopulmonary disease.   Electronically Signed   By: Curlene Dolphin M.D.   On: 04/15/2014 16:56    CBC  Recent Labs Lab 04/15/14 1653 04/16/14 0515  WBC 1.1* 1.1*  HGB 10.9* 9.9*  HCT 32.6* 29.2*  PLT 110* 115*  MCV 84.2 83.0  MCH 28.2 28.1  MCHC 33.4 33.9  RDW 15.2 15.0  LYMPHSABS 0.3*  --   MONOABS 0.0*  --   EOSABS 0.0   --   BASOSABS 0.0  --     Chemistries   Recent Labs Lab 04/15/14 1653 04/16/14 0515  NA 134* 133*  K 3.1* 3.1*  CL 95* 97  CO2 26 25  GLUCOSE 235* 133*  BUN 15 11  CREATININE 0.77 0.66  CALCIUM 9.2 8.4  AST 12 12  ALT 17 15  ALKPHOS 111 90  BILITOT 0.6 0.5   ------------------------------------------------------------------------------------------------------------------ estimated creatinine clearance is 88 ml/min (by C-G  formula based on Cr of 0.66). ------------------------------------------------------------------------------------------------------------------ No results found for this basename: HGBA1C,  in the last 72 hours ------------------------------------------------------------------------------------------------------------------ No results found for this basename: CHOL, HDL, LDLCALC, TRIG, CHOLHDL, LDLDIRECT,  in the last 72 hours ------------------------------------------------------------------------------------------------------------------ No results found for this basename: TSH, T4TOTAL, FREET3, T3FREE, THYROIDAB,  in the last 72 hours ------------------------------------------------------------------------------------------------------------------ No results found for this basename: VITAMINB12, FOLATE, FERRITIN, TIBC, IRON, RETICCTPCT,  in the last 72 hours  Coagulation profile No results found for this basename: INR, PROTIME,  in the last 168 hours  No results found for this basename: DDIMER,  in the last 72 hours  Cardiac Enzymes No results found for this basename: CK, CKMB, TROPONINI, MYOGLOBIN,  in the last 168 hours ------------------------------------------------------------------------------------------------------------------ No components found with this basename: POCBNP,     Monica [name] D.O. on 04/16/2014 at 2:29 PM  Between 7am to 7pm - Pager - [phone]  After 7pm go to [email or web address] - password [email or web address]  And look for the night  coverage person covering for me after hours  [facility] Office  [phone]

## 2014-04-17 ENCOUNTER — Other Ambulatory Visit: Payer: Medicare (Managed Care)

## 2014-04-17 ENCOUNTER — Ambulatory Visit: Payer: Medicare (Managed Care) | Admitting: Adult Health

## 2014-04-17 DIAGNOSIS — N390 Urinary tract infection, site not specified: Secondary | ICD-10-CM

## 2014-04-17 LAB — CBC WITH DIFFERENTIAL/PLATELET
Basophils Absolute: 0.1 10*3/uL (ref 0.0–0.1)
Basophils Relative: 3 % — ABNORMAL HIGH (ref 0–1)
Eosinophils Absolute: 0.1 10*3/uL (ref 0.0–0.7)
Eosinophils Relative: 3 % (ref 0–5)
HCT: 28.7 % — ABNORMAL LOW (ref 36.0–46.0)
Hemoglobin: 9.6 g/dL — ABNORMAL LOW (ref 12.0–15.0)
LYMPHS ABS: 0.5 10*3/uL — AB (ref 0.7–4.0)
Lymphocytes Relative: 25 % (ref 12–46)
MCH: 28.2 pg (ref 26.0–34.0)
MCHC: 33.4 g/dL (ref 30.0–36.0)
MCV: 84.2 fL (ref 78.0–100.0)
MONO ABS: 0.2 10*3/uL (ref 0.1–1.0)
Monocytes Relative: 12 % (ref 3–12)
NEUTROS PCT: 57 % (ref 43–77)
Neutro Abs: 0.9 10*3/uL — ABNORMAL LOW (ref 1.7–7.7)
PLATELETS: 118 10*3/uL — AB (ref 150–400)
RBC: 3.41 MIL/uL — ABNORMAL LOW (ref 3.87–5.11)
RDW: 14.7 % (ref 11.5–15.5)
WBC: 1.8 10*3/uL — AB (ref 4.0–10.5)

## 2014-04-17 LAB — URINE CULTURE
Colony Count: NO GROWTH
Culture: NO GROWTH

## 2014-04-17 LAB — BASIC METABOLIC PANEL
ANION GAP: 12 (ref 5–15)
BUN: 8 mg/dL (ref 6–23)
CALCIUM: 8.6 mg/dL (ref 8.4–10.5)
CO2: 24 mEq/L (ref 19–32)
CREATININE: 0.65 mg/dL (ref 0.50–1.10)
Chloride: 99 mEq/L (ref 96–112)
GFR calc Af Amer: >90 mL/min (ref >90)
Glucose, Bld: 144 mg/dL — ABNORMAL HIGH (ref 70–99)
Potassium: 3.3 mEq/L — ABNORMAL LOW (ref 3.7–5.3)
Sodium: 135 mEq/L — ABNORMAL LOW (ref 137–147)

## 2014-04-17 MED ORDER — POTASSIUM CHLORIDE CRYS ER 20 MEQ PO TBCR
40.0000 meq | EXTENDED_RELEASE_TABLET | Freq: Once | ORAL | Status: AC
  Administered 2014-04-17: 40 meq via ORAL
  Filled 2014-04-17: qty 2

## 2014-04-17 MED ORDER — ZOLPIDEM TARTRATE 5 MG PO TABS
5.0000 mg | ORAL_TABLET | Freq: Every evening | ORAL | Status: DC | PRN
  Administered 2014-04-18: 5 mg via ORAL
  Filled 2014-04-17: qty 1

## 2014-04-17 NOTE — Progress Notes (Signed)
PROGRESS NOTE  KISSY CIELO ZWC:[id number] DOB: Nov 13, 1945 DOA: 04/15/2014 PCP: Vena Austria, MD  Assessment/Plan: Febrile Neutropenia -ANC 1026 -Continue broad-spectrum antibiotics with vancomycin and cefepime  -Likely secondary to recent Neulasta therapy on 04/10/2014.  -CXR: No active cardiopulmonary disease  -UA--no pyuria due to no WBC to mount response -source of fever is urine -Blood cultures--neg to date -Oncology consultation appreciated.  -Will continue antibiotics, Tylenol for fever.  Bacteruria/UTI -urine culture with 100K GNR -continue cefepime pending final culture -d/c vanco in 24 hrs if blood culture remains neg Breast cancer  -Oncology consultation appreciated  -Patient is scheduled for chemotherapy in 7 days, however this will be postponed.  Hypertension  -Continue home medications.  Hypokalemia  -Will replace and continue to monitor  -am mag Pancytopenia  -Likely secondary to chemotherapy  -Continue monitor CBC  Code Status: Full  Family Communication: None at bedside  Disposition Plan: Admitted         Procedures/Studies: Dg Chest 2 View  04/15/2014   CLINICAL DATA:  Fever. Flu like symptoms. Receiving chemotherapy for breast cancer.  EXAM: CHEST  2 VIEW  COMPARISON:  Chest radiograph 02/12/2014  FINDINGS: Heart size is mildly prominent. Thoracic aorta and hilar contours are stable and normal. There is a right IJ power Port-A-Cath which terminates in the mid superior vena cava. Pulmonary vascularity is normal. The lungs are normally expanded and clear. Negative for airspace disease or pleural effusion. Trachea midline. No acute or suspicious bony abnormality. Upper abdomen unremarkable.  IMPRESSION: No active cardiopulmonary disease.   Electronically Signed   By: Curlene Dolphin M.D.   On: 04/15/2014 16:56         Subjective: Patient denies fevers, chills, headache, chest pain, dyspnea, nausea, vomiting, diarrhea,  abdominal pain, dysuria, hematuria Urine urgency is better.  Objective: Filed Vitals:   04/16/14 1357 04/16/14 2125 04/17/14 0530 04/17/14 1500  BP: 160/74 129/60 135/62 113/52  Pulse: 93 87 74 88  Temp: 99.8 F (37.7 C) 99.3 F (37.4 C) 98.7 F (37.1 C) 98.8 F (37.1 C)  TempSrc: Oral Oral Oral Oral  Resp: 18 18 17 18   Height:      Weight:      SpO2: 99% 100% 100% 100%    Intake/Output Summary (Last 24 hours) at 04/17/14 1835 Last data filed at 04/17/14 1400  Gross per 24 hour  Intake   3105 ml  Output   3150 ml  Net    -45 ml   Weight change:  Exam:   General:  Pt is alert, follows commands appropriately, not in acute distress  HEENT: No icterus, No thrush,  [place]/AT  Cardiovascular: RRR, S1/S2, no rubs, no gallops  Respiratory: CTA bilaterally, no wheezing, no crackles, no rhonchi  Abdomen: Soft/+BS, non tender, non distended, no guarding  Extremities: Trace LE edema, No lymphangitis, No petechiae, No rashes, no synovitis  Data Reviewed: Basic Metabolic Panel:  Recent Labs Lab 04/15/14 1653 04/16/14 0515 04/17/14 0448  NA 134* 133* 135*  K 3.1* 3.1* 3.3*  CL 95* 97 99  CO2 26 25 24   GLUCOSE 235* 133* 144*  BUN 15 11 8   CREATININE 0.77 0.66 0.65  CALCIUM 9.2 8.4 8.6   Liver Function Tests:  Recent Labs Lab 04/15/14 1653 04/16/14 0515  AST 12 12  ALT 17 15  ALKPHOS 111 90  BILITOT 0.6 0.5  PROT 6.4 5.8*  ALBUMIN 2.9* 2.6*   No results found for this  basename: LIPASE, AMYLASE,  in the last 168 hours No results found for this basename: AMMONIA,  in the last 168 hours CBC:  Recent Labs Lab 04/15/14 1653 04/16/14 0515 04/17/14 0448  WBC 1.1* 1.1* 1.8*  NEUTROABS 0.8*  --  0.9*  HGB 10.9* 9.9* 9.6*  HCT 32.6* 29.2* 28.7*  MCV 84.2 83.0 84.2  PLT 110* 115* 118*   Cardiac Enzymes: No results found for this basename: CKTOTAL, CKMB, CKMBINDEX, TROPONINI,  in the last 168 hours BNP: No components found with this basename: POCBNP,   CBG: No results found for this basename: GLUCAP,  in the last 168 hours  Recent Results (from the past 240 hour(s))  CULTURE, BLOOD (ROUTINE X 2)     Status: None   Collection Time    04/15/14  4:53 PM      Result Value Ref Range Status   Specimen Description BLOOD RIGHT FOREARM   Final   Special Requests BOTTLES DRAWN AEROBIC AND ANAEROBIC 5CC   Final   Culture  Setup Time     Final   Value: 04/16/2014 01:42     Performed at [facility]   Culture     Final   Value:        BLOOD CULTURE RECEIVED NO GROWTH TO DATE CULTURE WILL BE HELD FOR 5 DAYS BEFORE ISSUING A FINAL NEGATIVE REPORT     Performed at [facility]   Report Status PENDING   Incomplete  CULTURE, BLOOD (ROUTINE X 2)     Status: None   Collection Time    04/15/14  5:12 PM      Result Value Ref Range Status   Specimen Description BLOOD RIGHT ARM  5 ML IN [facility] BOTTLE   Final   Special Requests NONE   Final   Culture  Setup Time     Final   Value: 04/16/2014 01:42     Performed at [facility]   Culture     Final   Value:        BLOOD CULTURE RECEIVED NO GROWTH TO DATE CULTURE WILL BE HELD FOR 5 DAYS BEFORE ISSUING A FINAL NEGATIVE REPORT     Performed at [facility]   Report Status PENDING   Incomplete  URINE CULTURE     Status: None   Collection Time    04/15/14  6:14 PM      Result Value Ref Range Status   Specimen Description URINE, CLEAN CATCH   Final   Special Requests NONE   Final   Culture  Setup Time     Final   Value: 04/16/2014 02:08     Performed at [place]     Final   Value: >=100,000 COLONIES/ML     Performed at [facility]   Culture     Final   Value: [place]     Performed at [facility]   Report Status PENDING   Incomplete  URINE CULTURE     Status: None   Collection Time    04/16/14 12:28 AM      Result Value Ref Range Status   Specimen Description URINE, CLEAN CATCH   Final   Special Requests NONE    Final   Culture  Setup Time     Final   Value: 04/16/2014 08:16     Performed at [place]     Final  Value: NO GROWTH     Performed at [facility]     Final   Value: NO GROWTH     Performed at [facility]   Report Status 04/17/2014 FINAL   Final     Scheduled Meds: . amLODipine  5 mg Oral Daily  . ceFEPime (MAXIPIME) IV  2 g Intravenous 3 times per day  . cholecalciferol  2,000 Units Oral Daily  . docusate sodium  100 mg Oral BID  . enoxaparin (LOVENOX) injection  60 mg Subcutaneous QHS  . irbesartan  300 mg Oral Daily   And  . hydrochlorothiazide  25 mg Oral Daily  . vancomycin  1,000 mg Intravenous Q12H   Continuous Infusions: . sodium chloride 125 mL/hr at 04/17/14 0607     Taylie Helder, DO  [facility] Pager [phone]  If 7PM-7AM, please contact night-coverage [email or web address] Password [email or web address] 04/17/2014, 6:35 PM   LOS: 2 days

## 2014-04-18 DIAGNOSIS — N390 Urinary tract infection, site not specified: Secondary | ICD-10-CM

## 2014-04-18 LAB — CBC WITH DIFFERENTIAL/PLATELET
BASOS ABS: 0.1 10*3/uL (ref 0.0–0.1)
Basophils Relative: 2 % — ABNORMAL HIGH (ref 0–1)
EOS ABS: 0 10*3/uL (ref 0.0–0.7)
Eosinophils Relative: 1 % (ref 0–5)
HCT: 27.9 % — ABNORMAL LOW (ref 36.0–46.0)
Hemoglobin: 9.5 g/dL — ABNORMAL LOW (ref 12.0–15.0)
LYMPHS PCT: 25 % (ref 12–46)
Lymphs Abs: 0.6 10*3/uL — ABNORMAL LOW (ref 0.7–4.0)
MCH: 28 pg (ref 26.0–34.0)
MCHC: 34.1 g/dL (ref 30.0–36.0)
MCV: 82.3 fL (ref 78.0–100.0)
Monocytes Absolute: 0.4 10*3/uL (ref 0.1–1.0)
Monocytes Relative: 15 % — ABNORMAL HIGH (ref 3–12)
NEUTROS PCT: 57 % (ref 43–77)
Neutro Abs: 1.4 10*3/uL — ABNORMAL LOW (ref 1.7–7.7)
PLATELETS: 164 10*3/uL (ref 150–400)
RBC: 3.39 MIL/uL — ABNORMAL LOW (ref 3.87–5.11)
RDW: 14.7 % (ref 11.5–15.5)
WBC: 2.5 10*3/uL — ABNORMAL LOW (ref 4.0–10.5)

## 2014-04-18 LAB — BASIC METABOLIC PANEL
Anion gap: 11 (ref 5–15)
BUN: 6 mg/dL (ref 6–23)
CO2: 24 mEq/L (ref 19–32)
Calcium: 8.7 mg/dL (ref 8.4–10.5)
Chloride: 106 mEq/L (ref 96–112)
Creatinine, Ser: 0.66 mg/dL (ref 0.50–1.10)
Glucose, Bld: 127 mg/dL — ABNORMAL HIGH (ref 70–99)
POTASSIUM: 3.7 meq/L (ref 3.7–5.3)
SODIUM: 141 meq/L (ref 137–147)

## 2014-04-18 LAB — MAGNESIUM: Magnesium: 1.7 mg/dL (ref 1.5–2.5)

## 2014-04-18 LAB — URINE CULTURE

## 2014-04-18 MED ORDER — CIPROFLOXACIN HCL 500 MG PO TABS: 500.0000 mg | ORAL_TABLET | Freq: Two times a day (BID) | ORAL | Status: DC

## 2014-04-18 MED ORDER — CIPROFLOXACIN HCL 500 MG PO TABS
500.0000 mg | ORAL_TABLET | Freq: Two times a day (BID) | ORAL | Status: DC
  Administered 2014-04-18: 500 mg via ORAL
  Filled 2014-04-18 (×2): qty 1

## 2014-04-18 NOTE — Progress Notes (Signed)
Discharged from floor via w/c, family with pt. No changes in assessment. Monica [name]  

## 2014-04-18 NOTE — Discharge Summary (Signed)
Physician Discharge Summary  Monica [name] UXN:[id number] DOB: March 07, 1946 DOA: 04/15/2014  PCP: Vena Austria, MD  Admit date: 04/15/2014 Discharge date: 04/18/2014  Recommendations for Outpatient Follow-up:  1. Pt will need to follow up with PCP in 2 weeks post discharge 2. Dr. Jana Hakim in 1-2 weeks 3. CBC in one week   Discharge Diagnoses:  Febrile Neutropenia  -ANC 1425 on day of discharge  -Initially started broad-spectrum antibiotics with vancomycin and cefepime  -vanco was stopped when blood cultures remained neg -cefepime-->ciprofloxacin x 5 more days to complete 7 days of therapy -recent Neulasta therapy on 04/10/2014.  -CXR: No active cardiopulmonary disease  -UA--no pyuria due to no WBC to mount response  -source of fever is urine  -Blood cultures--neg to date  -Oncology consultation appreciated.  -Will continue antibiotics, Tylenol for fever.  Bacteruria/UTI  -urine culture with 100K Citrobacter  -continue cefepime pending final culture---> ciprofloxacin x 5 more days to complete 7 days of therapy Breast cancer  -Oncology consultation appreciated  -Patient is scheduled for chemotherapy in 7 days, however this will be postponed.  Hypertension  -Continue home medications.  -Patient will go home with amlodipine, valsartan, HCTZ Hypokalemia  -Will replace and continue to monitor  -am mag--1.7 Pancytopenia  -Likely secondary to chemotherapy  -hemoglobin remained stable  -WBC continues to recover  - ANC remained greater than 100048 hours prior to discharge  Code Status: Full     Discharge Condition: stable  Disposition:  discharge home   Diet: Heart healthy  Wt Readings from Last 3 Encounters:  04/15/14 112.492 kg (248 lb)  04/03/14 114.216 kg (251 lb 12.8 oz)  04/02/14 113.127 kg (249 lb 6.4 oz)    History of present illness:  68 y.o. pleasant female with history of hypertension and newly diagnosed left breast cancer in July 2015 status  post lumpectomy, now on CHEMOTHERAPY with doxorubicin and cyclophosphamide. Patient came to the emergency department today with complaints of general denies body aches, malaise, fever 101.68F, and the flulike symptoms. Patient's last chemotherapy was 6 days prior to admission.  this is the third cycle, she started feeling weak and tired on Friday and today her temperature was 101.68F, she complained of some nasal congestion, sore throat and dry cough.   blood cultures were obtained and were negative. Chest x-ray was negative for infiltrates. Initial urinalysis negative for pyuria. However the patient urine culture which grew Citrobacter. As the patient was leukopenic, she was not able to mount a inflammatory response. As result, there was no significant pyuria. Nevertheless, the patient was treated for a urinary tract infection. The patient experienced significant clinical improvement on intravenous antibiotics. The patient remained afebrile. Her ANC remained greater than 1000 x 48 hours. The patient was discharged home in stable condition with ciprofloxacin for 5 additional days which will complete one week of therapy.   Consultants: MedOnc--Magrinat  Discharge Exam: Filed Vitals:   04/18/14 0600  BP: 135/74  Pulse: 80  Temp: 99.1 F (37.3 C)  Resp: 16   Filed Vitals:   04/17/14 0530 04/17/14 1500 04/17/14 2222 04/18/14 0600  BP: 135/62 113/52 116/57 135/74  Pulse: 74 88 81 80  Temp: 98.7 F (37.1 C) 98.8 F (37.1 C) 99.1 F (37.3 C) 99.1 F (37.3 C)  TempSrc: Oral Oral Oral Oral  Resp: 17 18 17 16   Height:      Weight:      SpO2: 100% 100% 100% 100%   General: A&O x 3, NAD, pleasant, cooperative Cardiovascular: RRR,  no rub, no gallop, no S3 Respiratory: CTAB, no wheeze, no rhonchi Abdomen:soft, nontender, nondistended, positive bowel sounds Extremities: No edema, No lymphangitis, no petechiae  Discharge Instructions      Discharge Instructions   Diet - low sodium heart  healthy    Complete by:  As directed      Increase activity slowly    Complete by:  As directed             Medication List         amLODipine 5 MG tablet  Commonly known as:  NORVASC  Take 5 mg by mouth daily.     ciprofloxacin 500 MG tablet  Commonly known as:  CIPRO  Take 1 tablet (500 mg total) by mouth 2 (two) times daily.     dexamethasone 4 MG tablet  Commonly known as:  DECADRON  Take 4-8 mg by mouth 2 (two) times daily. Take one tablet the day after chemo and 2 tabs for 4 days after     docusate sodium 100 MG capsule  Commonly known as:  COLACE  Take 100 mg by mouth 2 (two) times daily.     Ginkgo Biloba 120 MG Caps  Take 1 capsule by mouth daily.     HYDROcodone-acetaminophen 5-325 MG per tablet  Commonly known as:  NORCO/VICODIN  Take 1 tablet by mouth every 4 (four) hours as needed for moderate pain.     ibuprofen 200 MG tablet  Commonly known as:  ADVIL,MOTRIN  Take 400 mg by mouth daily.     lidocaine-prilocaine cream  Commonly known as:  EMLA  Apply 1 application topically as needed. Apply over port site 1-2 hours before chemo and cover with plastic wrap     LORazepam 0.5 MG tablet  Commonly known as:  ATIVAN  Take 1 tablet (0.5 mg total) by mouth at bedtime as needed (Nausea or vomiting).     magic mouthwash Soln  Take 5-10 mLs by mouth 4 (four) times daily.     Melatonin 5 MG Tabs  Take 1 tablet by mouth at bedtime as needed (sleep).     nystatin-triamcinolone ointment  Commonly known as:  MYCOLOG     ondansetron 8 MG tablet  Commonly known as:  ZOFRAN  Take 1 tablet (8 mg total) by mouth 2 (two) times daily as needed. Start on the third day after chemotherapy.     oxyCODONE 5 MG immediate release tablet  Commonly known as:  Oxy IR/ROXICODONE  Take 1-2 tablets (5-10 mg total) by mouth every 4 (four) hours as needed for moderate pain, severe pain or breakthrough pain.     prochlorperazine 10 MG tablet  Commonly known as:  COMPAZINE    Take 1 tablet (10 mg total) by mouth every 6 (six) hours as needed (Nausea or vomiting).     UNABLE TO FIND  Take 3 tablets by mouth 2 (two) times daily. OMEGA XL     valsartan-hydrochlorothiazide 320-25 MG per tablet  Commonly known as:  DIOVAN-HCT  Take 1 tablet by mouth daily.     Vitamin D3 2000 UNITS Tabs  Take 1 tablet by mouth daily.         The results of significant diagnostics from this hospitalization (including imaging, microbiology, ancillary and laboratory) are listed below for reference.    Significant Diagnostic Studies: Dg Chest 2 View  04/15/2014   CLINICAL DATA:  Fever. Flu like symptoms. Receiving chemotherapy for breast cancer.  EXAM: CHEST  2 VIEW  COMPARISON:  Chest radiograph 02/12/2014  FINDINGS: Heart size is mildly prominent. Thoracic aorta and hilar contours are stable and normal. There is a right IJ power Port-A-Cath which terminates in the mid superior vena cava. Pulmonary vascularity is normal. The lungs are normally expanded and clear. Negative for airspace disease or pleural effusion. Trachea midline. No acute or suspicious bony abnormality. Upper abdomen unremarkable.  IMPRESSION: No active cardiopulmonary disease.   Electronically Signed   By: Curlene Dolphin M.D.   On: 04/15/2014 16:56     Microbiology: Recent Results (from the past 240 hour(s))  CULTURE, BLOOD (ROUTINE X 2)     Status: None   Collection Time    04/15/14  4:53 PM      Result Value Ref Range Status   Specimen Description BLOOD RIGHT FOREARM   Final   Special Requests BOTTLES DRAWN AEROBIC AND ANAEROBIC 5CC   Final   Culture  Setup Time     Final   Value: 04/16/2014 01:42     Performed at [facility]   Culture     Final   Value:        BLOOD CULTURE RECEIVED NO GROWTH TO DATE CULTURE WILL BE HELD FOR 5 DAYS BEFORE ISSUING A FINAL NEGATIVE REPORT     Performed at [facility]   Report Status PENDING   Incomplete  CULTURE, BLOOD (ROUTINE X 2)     Status: None    Collection Time    04/15/14  5:12 PM      Result Value Ref Range Status   Specimen Description BLOOD RIGHT ARM  5 ML IN [facility] BOTTLE   Final   Special Requests NONE   Final   Culture  Setup Time     Final   Value: 04/16/2014 01:42     Performed at [facility]   Culture     Final   Value:        BLOOD CULTURE RECEIVED NO GROWTH TO DATE CULTURE WILL BE HELD FOR 5 DAYS BEFORE ISSUING A FINAL NEGATIVE REPORT     Performed at [facility]   Report Status PENDING   Incomplete  URINE CULTURE     Status: None   Collection Time    04/15/14  6:14 PM      Result Value Ref Range Status   Specimen Description URINE, CLEAN CATCH   Final   Special Requests NONE   Final   Culture  Setup Time     Final   Value: 04/16/2014 02:08     Performed at [place]     Final   Value: >=100,000 COLONIES/ML     Performed at [facility]   Culture     Final   Value: CITROBACTER KOSERI     Performed at [facility]   Report Status 04/18/2014 FINAL   Final   Organism ID, Bacteria CITROBACTER KOSERI   Final  URINE CULTURE     Status: None   Collection Time    04/16/14 12:28 AM      Result Value Ref Range Status   Specimen Description URINE, CLEAN CATCH   Final   Special Requests NONE   Final   Culture  Setup Time     Final   Value: 04/16/2014 08:16     Performed at [place]     Final   Value: NO GROWTH  Performed at [facility]     Final   Value: NO GROWTH     Performed at [facility]   Report Status 04/17/2014 FINAL   Final     Labs: Basic Metabolic Panel:  Recent Labs Lab 04/15/14 1653 04/16/14 0515 04/17/14 0448 04/18/14 0444  NA 134* 133* 135* 141  K 3.1* 3.1* 3.3* 3.7  CL 95* 97 99 106  CO2 26 25 24 24   GLUCOSE 235* 133* 144* 127*  BUN 15 11 8 6   CREATININE 0.77 0.66 0.65 0.66  CALCIUM 9.2 8.4 8.6 8.7  MG  --   --   --  1.7   Liver Function Tests:  Recent  Labs Lab 04/15/14 1653 04/16/14 0515  AST 12 12  ALT 17 15  ALKPHOS 111 90  BILITOT 0.6 0.5  PROT 6.4 5.8*  ALBUMIN 2.9* 2.6*   No results found for this basename: LIPASE, AMYLASE,  in the last 168 hours No results found for this basename: AMMONIA,  in the last 168 hours CBC:  Recent Labs Lab 04/15/14 1653 04/16/14 0515 04/17/14 0448 04/18/14 0444  WBC 1.1* 1.1* 1.8* 2.5*  NEUTROABS 0.8*  --  0.9* 1.4*  HGB 10.9* 9.9* 9.6* 9.5*  HCT 32.6* 29.2* 28.7* 27.9*  MCV 84.2 83.0 84.2 82.3  PLT 110* 115* 118* 164   Cardiac Enzymes: No results found for this basename: CKTOTAL, CKMB, CKMBINDEX, TROPONINI,  in the last 168 hours BNP: No components found with this basename: POCBNP,  CBG: No results found for this basename: GLUCAP,  in the last 168 hours  Time coordinating discharge:  Greater than 30 minutes  Signed:  Jaiyanna Safran, DO [facility] Pager: [phone] 04/18/2014, 1:53 PM

## 2014-04-19 ENCOUNTER — Other Ambulatory Visit: Payer: Self-pay | Admitting: Emergency Medicine

## 2014-04-19 NOTE — Progress Notes (Signed)
Discharge summary sent to payer through [facility]  

## 2014-04-22 LAB — CULTURE, BLOOD (ROUTINE X 2)
CULTURE: NO GROWTH
Culture: NO GROWTH

## 2014-04-23 ENCOUNTER — Telehealth: Payer: Self-pay | Admitting: *Deleted

## 2014-04-23 ENCOUNTER — Other Ambulatory Visit: Payer: Medicare (Managed Care)

## 2014-04-23 ENCOUNTER — Encounter: Payer: Self-pay | Admitting: Nurse Practitioner

## 2014-04-23 ENCOUNTER — Ambulatory Visit (HOSPITAL_BASED_OUTPATIENT_CLINIC_OR_DEPARTMENT_OTHER): Payer: MEDICARE | Admitting: Nurse Practitioner

## 2014-04-23 ENCOUNTER — Other Ambulatory Visit (HOSPITAL_BASED_OUTPATIENT_CLINIC_OR_DEPARTMENT_OTHER): Payer: MEDICARE

## 2014-04-23 ENCOUNTER — Ambulatory Visit: Payer: Medicare (Managed Care)

## 2014-04-23 ENCOUNTER — Telehealth: Payer: Self-pay | Admitting: Nurse Practitioner

## 2014-04-23 VITALS — BP 131/71 | HR 79 | Temp 98.5°F | Resp 20 | Ht 66.0 in | Wt 246.2 lb

## 2014-04-23 DIAGNOSIS — C50512 Malignant neoplasm of lower-outer quadrant of left female breast: Secondary | ICD-10-CM

## 2014-04-23 LAB — COMPREHENSIVE METABOLIC PANEL (CC13)
ALK PHOS: 96 U/L (ref 40–150)
ALT: 44 U/L (ref 0–55)
AST: 29 U/L (ref 5–34)
Albumin: 2.9 g/dL — ABNORMAL LOW (ref 3.5–5.0)
Anion Gap: 10 mEq/L (ref 3–11)
BILIRUBIN TOTAL: 0.2 mg/dL (ref 0.20–1.20)
BUN: 7.6 mg/dL (ref 7.0–26.0)
CO2: 28 mEq/L (ref 22–29)
Calcium: 9.7 mg/dL (ref 8.4–10.4)
Chloride: 104 mEq/L (ref 98–109)
Creatinine: 0.8 mg/dL (ref 0.6–1.1)
GLUCOSE: 138 mg/dL (ref 70–140)
Potassium: 3.3 mEq/L — ABNORMAL LOW (ref 3.5–5.1)
SODIUM: 142 meq/L (ref 136–145)
Total Protein: 6.5 g/dL (ref 6.4–8.3)

## 2014-04-23 LAB — CBC WITH DIFFERENTIAL/PLATELET
BASO%: 0.8 % (ref 0.0–2.0)
Basophils Absolute: 0.1 10*3/uL (ref 0.0–0.1)
EOS%: 0.3 % (ref 0.0–7.0)
Eosinophils Absolute: 0 10*3/uL (ref 0.0–0.5)
HCT: 32.9 % — ABNORMAL LOW (ref 34.8–46.6)
HGB: 10.6 g/dL — ABNORMAL LOW (ref 11.6–15.9)
LYMPH%: 15 % (ref 14.0–49.7)
MCH: 27.5 pg (ref 25.1–34.0)
MCHC: 32.2 g/dL (ref 31.5–36.0)
MCV: 85.5 fL (ref 79.5–101.0)
MONO#: 0.8 10*3/uL (ref 0.1–0.9)
MONO%: 11.7 % (ref 0.0–14.0)
NEUT#: 5.1 10*3/uL (ref 1.5–6.5)
NEUT%: 72.2 % (ref 38.4–76.8)
PLATELETS: 307 10*3/uL (ref 145–400)
RBC: 3.85 10*6/uL (ref 3.70–5.45)
RDW: 15.8 % — ABNORMAL HIGH (ref 11.2–14.5)
WBC: 7.1 10*3/uL (ref 3.9–10.3)
lymph#: 1.1 10*3/uL (ref 0.9–3.3)

## 2014-04-23 NOTE — Telephone Encounter (Signed)
Per staff message and POF I have scheduled appts. Advised scheduler of appts. JMW  

## 2014-04-23 NOTE — Progress Notes (Signed)
[place]  [phone] [phone]) [phone]     ID: KIMARIE COOR DOB: 1945/07/03  MR#: [id number]  ONG#:[id number]  Patient Care Team: Maury Dus, MD as PCP - General (Family Medicine) Excell Seltzer, MD as Consulting Physician (General Surgery) Chauncey Cruel, MD as Consulting Physician (Oncology) Marye Round, MD as Consulting Physician (Radiation Oncology) Maisie Fus, MD as Consulting Physician (Obstetrics and Gynecology)   CHIEF COMPLAINT: Newly diagnosed breast cancer  CURRENT TREATMENT:  On adjuvant chemotherapy   BREAST CANCER HISTORY: From the original intake note:  [facility]ica [name] showing a possible mass in her left breast. On 01/05/2014 left diagnostic mammography and ultrasonography at the breast Center showed 2 adjacent poorly defined masses deep in the lower outer portion of the left breast. On physical exam these were palpable measuring approximately 3 cm altogether, at the 4:00 position 16 cm from the left nipple. There were no palpable left axillary lymph nodes. Ultrasound confirmed to and adjacent irregularly marginated hypoechoic masses in the area in question, measuring 1.5 and 1.0 cm respectively. The total area of by ultrasonography measured 2.6 cm. In addition, to left axillary lymph nodes showed mild cortical thickening in one of them showed loss of the normal fatty hilum.  On 01/10/2014 the patient underwent separate biopsies of both the left breast masses in question as well as the more suspicious lymph node. The final pathology ([id number]) showed the lymph node to be benign. (This was interpreted as concordant). Both of the breast masses, however, were positive for an invasive ductal carcinoma, which was HER-2 not amplified, with the signals ratio of 0.98 and the number per cell being 2.45. Estrogen and progesterone receptor determination is pending but looks negative  at this point.  On 01/16/2014 the patient underwent bilateral breast MRI the right breast was unremarkable. In the left breast lower outer quadrant there were 2 oval masses measuring 1.5 and 2.2 cm. Taken together they was approximately 4 cm of disease. There were no findings of concern in the right breast. In the left axilla there was a single mildly prominent lymph node. There was no suspicious internal mammary adenopathy.  The patient's subsequent history is as detailed below  INTERVAL HISTORY: Monica [name] returns today for follow up of her breast cancer. She spent 48 hrs in the hospital from 10/25-10/26 with neutropenic fevers and a UTI. Her last dose of cipro PO is today, and her symptoms are much improved. She experiences some nausea with the antibiotic, but none otherwise. She is regaining her strength as the fatigue subsides.  REVIEW OF SYSTEMS: Since her discharge, Monica [name] denies pain, fevers, or chills. She was constipated this past week, but stool softeners are helping. She is using drops of lemon juice and vinegar to help improve her sense of taste, but this is minimally helpful. Despite the circumstances she remains well hydrated and her appetite is healthy. She denies mouth sores or rashes. She felt numbness to the bottom of her bilateral feet while admitted to the hospital, but this has since resolved. She denies shortness of breath, chest pain, cough, or palpitations this week. A detailed review of systems is otherwise noncontributory.    PAST MEDICAL HISTORY: Past Medical History  Diagnosis Date  . Allergy codeine    rapid heart beat, cold sweat  . Arthritis   . Breast cancer   . Hypertension     PAST SURGICAL HISTORY: Past Surgical History  Procedure Laterality Date  .  Knee arthroscopy  2012    right  . Lumbar laminectomy  1991  . Colonoscopy    . Foot arthroplasty  1998    right  . Abdominal hysterectomy  1992  . Breast lumpectomy with needle localization and axillary  sentinel lymph node bx Left 02/12/2014    Procedure: LEFT BREAST LUMPECTOMY WITH NEEDLE LOCALIZATION AND LEFT AXILLARY SENTINEL LYMPH NODE BX;  Surgeon: [name], MD;  Location: [facility];  Service: General;  Laterality: Left;  . Portacath placement Right 02/12/2014    Procedure: INSERTION PORT-A-CATH;  Surgeon: [name], MD;  Location: [facility];  Service: General;  Laterality: Right;    FAMILY HISTORY Family History  Problem Relation Age of Onset  . Cancer Sister   . Cancer Brother   . Cancer Maternal Aunt   . Cancer Paternal Aunt    the patient's father died at the age of 88 from a stroke. The patient's mother died at the age of 82 from "multiple causes". The patient had 2 brothers and 2 sisters. The patient's sister was diagnosed with uterine cancer at the age of 62, and the patient's brother [name] had "bone cancer" and died in his 65s. He was treated at the [facility]. There is no history of breast or ovarian cancer in the family to the patient's knowledge.  GYNECOLOGIC HISTORY:  No LMP recorded. Patient has had a hysterectomy. Menarche age 68, the patient is GX P0. She underwent hysterectomy in 1992, with no salpingo-oophorectomy. She has been on estrogen replacement since. 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 Her husband Monica [name] worked for [employer] in [place] as a Facilities manager. The patient's adopted son Monica [name] "[name]" Monica [name] works as a laborer in [place]. The patient's god-daughter Monica [name] is an Optometrist. The patient has no grandchildren. The patient attends a local church of  [place], and her husband Monica [name] is pastor   ADVANCED DIRECTIVES: In place   HEALTH MAINTENANCE: History  Substance Use Topics  . Smoking status: Never Smoker   . Smokeless tobacco: Never Used  . Alcohol Use: No      Colonoscopy: October 2008  PAP: July 2015  Bone density: 01/01/2014  Lipid panel:  Allergies  Allergen Reactions  . Codeine     Heart fast    Current Outpatient Prescriptions  Medication Sig Dispense Refill  . Alum & Mag Hydroxide-Simeth (MAGIC MOUTHWASH) SOLN Take 5-10 mLs by mouth 4 (four) times daily.    [name] amLODipine (NORVASC) 5 MG tablet Take 5 mg by mouth daily.    . Cholecalciferol (VITAMIN D3) 2000 UNITS TABS Take 1 tablet by mouth daily.    . ciprofloxacin (CIPRO) 500 MG tablet Take 1 tablet (500 mg total) by mouth 2 (two) times daily. 10 tablet 0  . dexamethasone (DECADRON) 4 MG tablet Take 4-8 mg by mouth 2 (two) times daily. Take one tablet the day after chemo and 2 tabs for 4 days after    . docusate sodium (COLACE) 100 MG capsule Take 100 mg by mouth 2 (two) times daily.    . Ginkgo Biloba 120 MG CAPS Take 1 capsule by mouth daily.    [name] ibuprofen (ADVIL,MOTRIN) 200 MG tablet Take 400 mg by mouth daily.    [name] lidocaine-prilocaine (EMLA) cream Apply 1 application topically as needed. Apply over port site 1-2 hours before chemo and  cover with plastic wrap 30 g 0  . LORazepam (ATIVAN) 0.5 MG tablet Take 1 tablet (0.5 mg total) by mouth at bedtime as needed (Nausea or vomiting). 30 tablet 0  . Melatonin 5 MG TABS Take 1 tablet by mouth at bedtime as needed (sleep).    . nystatin-triamcinolone ointment (MYCOLOG)     . UNABLE TO FIND Take 3 tablets by mouth 2 (two) times daily. OMEGA XL    . valsartan-hydrochlorothiazide (DIOVAN-HCT) 320-25 MG per tablet Take 1 tablet by mouth daily.    [name] HYDROcodone-acetaminophen (NORCO/VICODIN) 5-325 MG per tablet Take 1 tablet by mouth every 4 (four) hours as needed for moderate pain.     [name] ondansetron (ZOFRAN) 8 MG tablet Take 1 tablet (8 mg total) by mouth 2 (two) times daily as needed. Start on the third day after chemotherapy. 30 tablet 1  . oxyCODONE (OXY IR/ROXICODONE) 5 MG immediate release tablet Take 1-2 tablets (5-10 mg total) by  mouth every 4 (four) hours as needed for moderate pain, severe pain or breakthrough pain. 40 tablet 0  . prochlorperazine (COMPAZINE) 10 MG tablet Take 1 tablet (10 mg total) by mouth every 6 (six) hours as needed (Nausea or vomiting). 30 tablet 1   No current facility-administered medications for this visit.    OBJECTIVE: Middle-aged [place] American woman who appears stated age 49 Vitals:   04/23/14 0835  BP: 131/71  Pulse: 79  Temp: 98.5 F (36.9 C)  Resp: 20     Body mass index is 39.76 kg/(m^2).    ECOG FS:1 - Symptomatic but completely ambulatory  GENERAL: Patient is a well appearing female in no acute distress Skin warm, hand excessively dry and hyperpigmented Sclerae unicteric, pupils equal and reactive Oropharynx clear and moist-- no thrush No cervical or supraclavicular adenopathy Lungs no rales or rhonchi Heart regular rate and rhythm Abd soft, nontender, positive bowel sounds MSK no focal spinal tenderness, no upper extremity lymphedema Neuro: nonfocal, well oriented, appropriate affect Breasts:deferred  LAB RESULTS:  CMP     Component Value Date/Time   NA 141 04/18/2014 0444   NA 141 04/09/2014 0818   K 3.7 04/18/2014 0444   K 3.8 04/09/2014 0818   CL 106 04/18/2014 0444   CO2 24 04/18/2014 0444   CO2 27 04/09/2014 0818   GLUCOSE 127* 04/18/2014 0444   GLUCOSE 114 04/09/2014 0818   BUN 6 04/18/2014 0444   BUN 12.2 04/09/2014 0818   CREATININE 0.66 04/18/2014 0444   CREATININE 0.9 04/09/2014 0818   CALCIUM 8.7 04/18/2014 0444   CALCIUM 9.9 04/09/2014 0818   PROT 5.8* 04/16/2014 0515   PROT 6.8 04/09/2014 0818   ALBUMIN 2.6* 04/16/2014 0515   ALBUMIN 3.2* 04/09/2014 0818   AST 12 04/16/2014 0515   AST 17 04/09/2014 0818   ALT 15 04/16/2014 0515   ALT 25 04/09/2014 0818   ALKPHOS 90 04/16/2014 0515   ALKPHOS 92 04/09/2014 0818   BILITOT 0.5 04/16/2014 0515   BILITOT 0.20 04/09/2014 0818   GFRNONAA >90 04/18/2014 0444   GFRAA >90 04/18/2014  0444    I No results found for: SPEP  Lab Results  Component Value Date   WBC 7.1 04/23/2014   NEUTROABS 5.1 04/23/2014   HGB 10.6* 04/23/2014   HCT 32.9* 04/23/2014   MCV 85.5 04/23/2014   PLT 307 04/23/2014      Chemistry      Component Value Date/Time   NA 141 04/18/2014 0444   NA 141 04/09/2014 0818  K 3.7 04/18/2014 0444   K 3.8 04/09/2014 0818   CL 106 04/18/2014 0444   CO2 24 04/18/2014 0444   CO2 27 04/09/2014 0818   BUN 6 04/18/2014 0444   BUN 12.2 04/09/2014 0818   CREATININE 0.66 04/18/2014 0444   CREATININE 0.9 04/09/2014 0818      Component Value Date/Time   CALCIUM 8.7 04/18/2014 0444   CALCIUM 9.9 04/09/2014 0818   ALKPHOS 90 04/16/2014 0515   ALKPHOS 92 04/09/2014 0818   AST 12 04/16/2014 0515   AST 17 04/09/2014 0818   ALT 15 04/16/2014 0515   ALT 25 04/09/2014 0818   BILITOT 0.5 04/16/2014 0515   BILITOT 0.20 04/09/2014 0818       No results found for: LABCA2  No components found for: LABCA125  No results for input(s): INR in the last 168 hours.  Urinalysis    Component Value Date/Time   COLORURINE YELLOW 04/15/2014 1814    STUDIES: Most recent echocardiogram on 04/03/14 showed a well preserved ejection fraction of 60-65%    ASSESSMENT: 68 y.o. [place] woman status post left breast biopsy of 2 separate lower outer quadrant masses 01/10/2014 for a multifocal cT2 cNX, stage II invasive ductal carcinoma, grade 3, with an MIB-1 of 90%, HER-2 negative, estrogen receptors 3% positive with weak staining, progesterone receptor negative, with an MIB-1 of 90%; this is clinically a "triple-negative" breast cancer and will be treated as such  (1) a suspicious left axillary lymph node biopsied 01/10/2014 was negative (concordant)  (2) left lumpectomy and sentinel lymph node sampling 02/12/2014 showed an mpT2 pN0, stage IIA invasive ductal carcinoma grade 3, repeat HER-2 pending  (3) adjuvant chemotherapy starting 03/12/2014 to consist of  dose dense doxorubicin and cyclophosphamide x4, with Neulasta support, followed by weekly paclitaxel and carboplatin x12  (4) adjuvant radiation to follow chemotherapy  PLAN:  Emali is much improved this week. The CBC was reviewed in detail and was stable. Her ANC and treatment related anemia is trending upwards. Per Dr. Jana Hakim we will hold this week of chemotherapy and dose reduce by 20% when we resume next week.   Lurline will return next week for labs, an office visit, and her 4th and final dose of doxorubicin and cyclophosphamide. Then she will begin paclitaxel and carboplatin weekly x 12. She understands and agrees with this plan. She knows the goal of treatment in her case is cure. She has been encouraged to call with any issues that might arise before her next visit here.   Marcelino Duster, [place] [phone] 04/23/2014 9:07 AM

## 2014-04-23 NOTE — Telephone Encounter (Signed)
, °

## 2014-04-24 ENCOUNTER — Ambulatory Visit: Payer: Medicare (Managed Care)

## 2014-05-02 ENCOUNTER — Other Ambulatory Visit: Payer: Self-pay | Admitting: Oncology

## 2014-05-02 ENCOUNTER — Ambulatory Visit (HOSPITAL_BASED_OUTPATIENT_CLINIC_OR_DEPARTMENT_OTHER): Payer: MEDICARE | Admitting: Nurse Practitioner

## 2014-05-02 ENCOUNTER — Other Ambulatory Visit (HOSPITAL_BASED_OUTPATIENT_CLINIC_OR_DEPARTMENT_OTHER): Payer: MEDICARE

## 2014-05-02 ENCOUNTER — Telehealth: Payer: Self-pay | Admitting: Oncology

## 2014-05-02 ENCOUNTER — Encounter: Payer: Self-pay | Admitting: Nurse Practitioner

## 2014-05-02 ENCOUNTER — Ambulatory Visit (HOSPITAL_BASED_OUTPATIENT_CLINIC_OR_DEPARTMENT_OTHER): Payer: MEDICARE

## 2014-05-02 VITALS — BP 144/64 | HR 87 | Temp 98.7°F | Resp 18 | Ht 66.0 in | Wt 246.3 lb

## 2014-05-02 DIAGNOSIS — Z5111 Encounter for antineoplastic chemotherapy: Secondary | ICD-10-CM

## 2014-05-02 DIAGNOSIS — E876 Hypokalemia: Secondary | ICD-10-CM

## 2014-05-02 DIAGNOSIS — G4700 Insomnia, unspecified: Secondary | ICD-10-CM

## 2014-05-02 DIAGNOSIS — C50512 Malignant neoplasm of lower-outer quadrant of left female breast: Secondary | ICD-10-CM

## 2014-05-02 LAB — COMPREHENSIVE METABOLIC PANEL (CC13)
ALT: 28 U/L (ref 0–55)
ANION GAP: 7 meq/L (ref 3–11)
AST: 20 U/L (ref 5–34)
Albumin: 3 g/dL — ABNORMAL LOW (ref 3.5–5.0)
Alkaline Phosphatase: 78 U/L (ref 40–150)
BILIRUBIN TOTAL: 0.2 mg/dL (ref 0.20–1.20)
BUN: 11.9 mg/dL (ref 7.0–26.0)
CO2: 29 meq/L (ref 22–29)
CREATININE: 1 mg/dL (ref 0.6–1.1)
Calcium: 9.5 mg/dL (ref 8.4–10.4)
Chloride: 104 mEq/L (ref 98–109)
Glucose: 213 mg/dl — ABNORMAL HIGH (ref 70–140)
Potassium: 3.3 mEq/L — ABNORMAL LOW (ref 3.5–5.1)
Sodium: 140 mEq/L (ref 136–145)
Total Protein: 6.3 g/dL — ABNORMAL LOW (ref 6.4–8.3)

## 2014-05-02 LAB — CBC WITH DIFFERENTIAL/PLATELET
BASO%: 2.5 % — ABNORMAL HIGH (ref 0.0–2.0)
Basophils Absolute: 0.2 10*3/uL — ABNORMAL HIGH (ref 0.0–0.1)
EOS ABS: 0.1 10*3/uL (ref 0.0–0.5)
EOS%: 2.1 % (ref 0.0–7.0)
HEMATOCRIT: 33 % — AB (ref 34.8–46.6)
HGB: 10.8 g/dL — ABNORMAL LOW (ref 11.6–15.9)
LYMPH%: 16 % (ref 14.0–49.7)
MCH: 28 pg (ref 25.1–34.0)
MCHC: 32.8 g/dL (ref 31.5–36.0)
MCV: 85.3 fL (ref 79.5–101.0)
MONO#: 1 10*3/uL — AB (ref 0.1–0.9)
MONO%: 16.3 % — ABNORMAL HIGH (ref 0.0–14.0)
NEUT#: 3.8 10*3/uL (ref 1.5–6.5)
NEUT%: 63.1 % (ref 38.4–76.8)
Platelets: 568 10*3/uL — ABNORMAL HIGH (ref 145–400)
RBC: 3.86 10*6/uL (ref 3.70–5.45)
RDW: 16 % — ABNORMAL HIGH (ref 11.2–14.5)
WBC: 6 10*3/uL (ref 3.9–10.3)
lymph#: 1 10*3/uL (ref 0.9–3.3)

## 2014-05-02 MED ORDER — SODIUM CHLORIDE 0.9 % IV SOLN
500.0000 mg/m2 | Freq: Once | INTRAVENOUS | Status: AC
  Administered 2014-05-02: 1160 mg via INTRAVENOUS
  Filled 2014-05-02: qty 58

## 2014-05-02 MED ORDER — PALONOSETRON HCL INJECTION 0.25 MG/5ML
INTRAVENOUS | Status: AC
  Filled 2014-05-02: qty 5

## 2014-05-02 MED ORDER — DEXAMETHASONE SODIUM PHOSPHATE 20 MG/5ML IJ SOLN
12.0000 mg | Freq: Once | INTRAMUSCULAR | Status: AC
  Administered 2014-05-02: 12 mg via INTRAVENOUS

## 2014-05-02 MED ORDER — SODIUM CHLORIDE 0.9 % IV SOLN
150.0000 mg | Freq: Once | INTRAVENOUS | Status: AC
  Administered 2014-05-02: 150 mg via INTRAVENOUS
  Filled 2014-05-02: qty 5

## 2014-05-02 MED ORDER — SODIUM CHLORIDE 0.9 % IJ SOLN
10.0000 mL | INTRAMUSCULAR | Status: DC | PRN
  Administered 2014-05-02: 10 mL
  Filled 2014-05-02: qty 10

## 2014-05-02 MED ORDER — DOXORUBICIN HCL CHEMO IV INJECTION 2 MG/ML
50.0000 mg/m2 | Freq: Once | INTRAVENOUS | Status: AC
  Administered 2014-05-02: 116 mg via INTRAVENOUS
  Filled 2014-05-02: qty 58

## 2014-05-02 MED ORDER — TRAZODONE HCL 50 MG PO TABS: 50.0000 mg | ORAL_TABLET | Freq: Every day | ORAL | Status: DC

## 2014-05-02 MED ORDER — HEPARIN SOD (PORK) LOCK FLUSH 100 UNIT/ML IV SOLN
500.0000 [IU] | Freq: Once | INTRAVENOUS | Status: AC | PRN
  Administered 2014-05-02: 500 [IU]
  Filled 2014-05-02: qty 5

## 2014-05-02 MED ORDER — PALONOSETRON HCL INJECTION 0.25 MG/5ML
0.2500 mg | Freq: Once | INTRAVENOUS | Status: AC
  Administered 2014-05-02: 0.25 mg via INTRAVENOUS

## 2014-05-02 MED ORDER — SODIUM CHLORIDE 0.9 % IV SOLN
Freq: Once | INTRAVENOUS | Status: AC
  Administered 2014-05-02: 11:00:00 via INTRAVENOUS

## 2014-05-02 MED ORDER — POTASSIUM CHLORIDE ER 10 MEQ PO CPCR
20.0000 meq | ORAL_CAPSULE | Freq: Every day | ORAL | Status: DC
Start: 2014-05-02 — End: 2014-05-28

## 2014-05-02 MED ORDER — DEXAMETHASONE SODIUM PHOSPHATE 20 MG/5ML IJ SOLN
INTRAMUSCULAR | Status: AC
  Filled 2014-05-02: qty 5

## 2014-05-02 NOTE — Telephone Encounter (Signed)
pt came in to r/s inj...done...pt aware of new d.t

## 2014-05-02 NOTE — Progress Notes (Signed)
Monica [name]  [phone] [phone]     ID: Monica [name] DOB: July 04, 1945  MR#: [id number]  EQA#:[id number]  Patient Care Team: Monica Dus, MD as PCP - General (Family Medicine) Monica Seltzer, MD as Consulting Physician (General Surgery) Monica Cruel, MD as Consulting Physician (Oncology) Monica Round, MD as Consulting Physician (Radiation Oncology) Monica Fus, MD as Consulting Physician (Obstetrics and Gynecology)   CHIEF COMPLAINT: Newly diagnosed breast cancer  CURRENT TREATMENT:  On adjuvant chemotherapy  BREAST CANCER HISTORY: From the original intake note:  [name] showing a possible mass in her left breast. On 01/05/2014 left diagnostic mammography and ultrasonography at the breast Center showed 2 adjacent poorly defined masses deep in the lower outer portion of the left breast. On physical exam these were palpable measuring approximately 3 cm altogether, at the 4:00 position 16 cm from the left nipple. There were no palpable left axillary lymph nodes. Ultrasound confirmed to and adjacent irregularly marginated hypoechoic masses in the area in question, measuring 1.5 and 1.0 cm respectively. The total area of by ultrasonography measured 2.6 cm. In addition, to left axillary lymph nodes showed mild cortical thickening in one of them showed loss of the normal fatty hilum.  On 01/10/2014 the patient underwent separate biopsies of both the left breast masses in question as well as the more suspicious lymph node. The final pathology ([id number]) showed the lymph node to be benign. (This was interpreted as concordant). Both of the breast masses, however, were positive for an invasive ductal carcinoma, which was HER-2 not amplified, with the signals ratio of 0.98 and the number per cell being 2.45. Estrogen and progesterone receptor determination is pending but looks negative  at this point.  On 01/16/2014 the patient underwent bilateral breast MRI the right breast was unremarkable. In the left breast lower outer quadrant there were 2 oval masses measuring 1.5 and 2.2 cm. Taken together they was approximately 4 cm of disease. There were no findings of concern in the right breast. In the left axilla there was a single mildly prominent lymph node. There was no suspicious internal mammary adenopathy.  The patient's subsequent history is as detailed below  INTERVAL HISTORY: Monica [name] returns today for follow up of her breast cancer. Today she is due for her 4th and final cycle of doxorubicin and cyclophosphamide. The interval history is generally unremarkable. She just arrived back in town from a business trip to [place]. She is using "queezy pops" for her intermittent nausea. Her appetite has decreased, secondary to a change in taste. She finds most foods too salty. She uses a drop of vinegar to alleviate the taste in some foods with minimal success. She is staying well hydrated however. In addition, she has had tremendous insomnia. Melatonin and lorazepam have been virtually useless.   REVIEW OF SYSTEMS: Monica [name] denies fevers, chills, or changes in bowel or bladder habits. Her mouth sores have resolved with the use of magic mouthwash an Biotene. She has no rashes, headaches, dizziness, or vision changes. She denies shortness of breath, chest pain, cough, or palpitations, but has some fatigue. A detailed review of systems is otherwise noncontributory.    PAST MEDICAL HISTORY: Past Medical History  Diagnosis Date  . Allergy codeine    rapid heart beat, cold sweat  . Arthritis   . Breast cancer   . Hypertension     PAST SURGICAL HISTORY: Past Surgical History  Procedure  Laterality Date  . Knee arthroscopy  2012    right  . Lumbar laminectomy  1991  . Colonoscopy    . Foot arthroplasty  1998    right  . Abdominal hysterectomy  1992  . Breast lumpectomy with needle  localization and axillary sentinel lymph node bx Left 02/12/2014    Procedure: LEFT BREAST LUMPECTOMY WITH NEEDLE LOCALIZATION AND LEFT AXILLARY SENTINEL LYMPH NODE BX;  Surgeon: Monica Jolly, MD;  Location: [facility];  Service: General;  Laterality: Left;  . Portacath placement Right 02/12/2014    Procedure: INSERTION PORT-A-CATH;  Surgeon: Monica Jolly, MD;  Location: [facility];  Service: General;  Laterality: Right;    FAMILY HISTORY Family History  Problem Relation Age of Onset  . Cancer Sister   . Cancer Brother   . Cancer Maternal Aunt   . Cancer Paternal Aunt    the patient's father died at the age of 75 from a stroke. The patient's mother died at the age of 74 from "multiple causes". The patient had 2 brothers and 2 sisters. The patient's sister was diagnosed with uterine cancer at the age of 71, and the patient's brother Monica [name] had "bone cancer" and died in his 16s. He was treated at the [facility]. There is no history of breast or ovarian cancer in the family to the patient's knowledge.  GYNECOLOGIC HISTORY:  No LMP recorded. Patient has had a hysterectomy. Menarche age 77, the patient is GX P0. She underwent hysterectomy in 1992, with no salpingo-oophorectomy. She has been on estrogen replacement since. 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 Her husband Monica [name] worked for [employer] in [place] as a Facilities manager. The patient's adopted son Monica [name] works as a laborer in [place]. The patient's god-daughter Monica [name] is an Optometrist. The patient has no grandchildren. The patient attends a local church of  [place], and her husband Monica [name] is pastor   ADVANCED DIRECTIVES: In place   HEALTH MAINTENANCE: History  Substance Use Topics  . Smoking status: Never Smoker   . Smokeless tobacco: Never Used   . Alcohol Use: No     Colonoscopy: October 2008  PAP: July 2015  Bone density: 01/01/2014  Lipid panel:  Allergies  Allergen Reactions  . Codeine     Heart fast    Current Outpatient Prescriptions  Medication Sig Dispense Refill  . Alum & Mag Hydroxide-Simeth (MAGIC MOUTHWASH) SOLN Take 5-10 mLs by mouth 4 (four) times daily.    [name] amLODipine (NORVASC) 5 MG tablet Take 5 mg by mouth daily.    . Cholecalciferol (VITAMIN D3) 2000 UNITS TABS Take 1 tablet by mouth daily.    [name] dexamethasone (DECADRON) 4 MG tablet Take 4-8 mg by mouth 2 (two) times daily. Take one tablet the day after chemo and 2 tabs for 4 days after    . docusate sodium (COLACE) 100 MG capsule Take 100 mg by mouth 2 (two) times daily.    . Ginkgo Biloba 120 MG CAPS Take 1 capsule by mouth daily.    [name] ibuprofen (ADVIL,MOTRIN) 200 MG tablet Take 400 mg by mouth daily.    [name] lidocaine-prilocaine (EMLA) cream Apply 1 application topically as needed. Apply over port site 1-2 hours before chemo and cover with plastic wrap 30 g 0  . LORazepam (ATIVAN) 0.5 MG tablet Take 1 tablet (0.5  mg total) by mouth at bedtime as needed (Nausea or vomiting). 30 tablet 0  . Melatonin 5 MG TABS Take 1 tablet by mouth at bedtime as needed (sleep).    . nystatin-triamcinolone ointment (MYCOLOG)     . ondansetron (ZOFRAN) 8 MG tablet Take 1 tablet (8 mg total) by mouth 2 (two) times daily as needed. Start on the third day after chemotherapy. 30 tablet 1  . UNABLE TO FIND Take 3 tablets by mouth 2 (two) times daily. OMEGA XL    . valsartan-hydrochlorothiazide (DIOVAN-HCT) 320-25 MG per tablet Take 1 tablet by mouth daily.    . ciprofloxacin (CIPRO) 500 MG tablet Take 1 tablet (500 mg total) by mouth 2 (two) times daily. 10 tablet 0  . HYDROcodone-acetaminophen (NORCO/VICODIN) 5-325 MG per tablet Take 1 tablet by mouth every 4 (four) hours as needed for moderate pain.     [name] oxyCODONE (OXY IR/ROXICODONE) 5 MG immediate release tablet Take 1-2  tablets (5-10 mg total) by mouth every 4 (four) hours as needed for moderate pain, severe pain or breakthrough pain. 40 tablet 0  . prochlorperazine (COMPAZINE) 10 MG tablet Take 1 tablet (10 mg total) by mouth every 6 (six) hours as needed (Nausea or vomiting). 30 tablet 1  . traZODone (DESYREL) 50 MG tablet Take 1 tablet (50 mg total) by mouth at bedtime. 30 tablet 2   No current facility-administered medications for this visit.    OBJECTIVE: Middle-aged [place] American woman who appears stated age 7 Vitals:   05/02/14 0945  BP: 144/64  Pulse: 87  Temp: 98.7 F (37.1 C)  Resp: 18     Body mass index is 39.77 kg/(m^2).    ECOG FS:1 - Symptomatic but completely ambulatory  GENERAL: Patient is a well appearing female in no acute distress Skin: warm, face and hands excessively dry HEENT: sclerae anicteric, conjunctivae pink, oropharynx clear. No thrush or mucositis.  Lymph Nodes: No cervical or supraclavicular lymphadenopathy  Lungs: clear to auscultation bilaterally, no rales, wheezes, or rhonci  Heart: regular rate and rhythm  Abdomen: [name], soft, non tender, positive bowel sounds  Musculoskeletal: No focal spinal tenderness, no peripheral edema  Neuro: non focal, well oriented, positive affect  Breasts: deferred  LAB RESULTS:  CMP     Component Value Date/Time   NA 140 05/02/2014 0933   NA 141 04/18/2014 0444   K 3.3* 05/02/2014 0933   K 3.7 04/18/2014 0444   CL 106 04/18/2014 0444   CO2 29 05/02/2014 0933   CO2 24 04/18/2014 0444   GLUCOSE 213* 05/02/2014 0933   GLUCOSE 127* 04/18/2014 0444   BUN 11.9 05/02/2014 0933   BUN 6 04/18/2014 0444   CREATININE 1.0 05/02/2014 0933   CREATININE 0.66 04/18/2014 0444   CALCIUM 9.5 05/02/2014 0933   CALCIUM 8.7 04/18/2014 0444   PROT 6.3* 05/02/2014 0933   PROT 5.8* 04/16/2014 0515   ALBUMIN 3.0* 05/02/2014 0933   ALBUMIN 2.6* 04/16/2014 0515   AST 20 05/02/2014 0933   AST 12 04/16/2014 0515   ALT 28 05/02/2014 0933     ALT 15 04/16/2014 0515   ALKPHOS 78 05/02/2014 0933   ALKPHOS 90 04/16/2014 0515   BILITOT 0.20 05/02/2014 0933   BILITOT 0.5 04/16/2014 0515   GFRNONAA >90 04/18/2014 0444   GFRAA >90 04/18/2014 0444    I No results found for: SPEP  Lab Results  Component Value Date   WBC 6.0 05/02/2014   NEUTROABS 3.8 05/02/2014   HGB 10.8* 05/02/2014  HCT 33.0* 05/02/2014   MCV 85.3 05/02/2014   PLT 568* 05/02/2014      Chemistry      Component Value Date/Time   NA 140 05/02/2014 0933   NA 141 04/18/2014 0444   K 3.3* 05/02/2014 0933   K 3.7 04/18/2014 0444   CL 106 04/18/2014 0444   CO2 29 05/02/2014 0933   CO2 24 04/18/2014 0444   BUN 11.9 05/02/2014 0933   BUN 6 04/18/2014 0444   CREATININE 1.0 05/02/2014 0933   CREATININE 0.66 04/18/2014 0444      Component Value Date/Time   CALCIUM 9.5 05/02/2014 0933   CALCIUM 8.7 04/18/2014 0444   ALKPHOS 78 05/02/2014 0933   ALKPHOS 90 04/16/2014 0515   AST 20 05/02/2014 0933   AST 12 04/16/2014 0515   ALT 28 05/02/2014 0933   ALT 15 04/16/2014 0515   BILITOT 0.20 05/02/2014 0933   BILITOT 0.5 04/16/2014 0515       No results found for: LABCA2  No components found for: FFMBW466  No results for input(s): INR in the last 168 hours.  Urinalysis    Component Value Date/Time   COLORURINE YELLOW 04/15/2014 1814    STUDIES: Most recent echocardiogram on 04/03/14 showed a well preserved ejection fraction of 60-65%    ASSESSMENT: 68 y.o. [place] woman status post left breast biopsy of 2 separate lower outer quadrant masses 01/10/2014 for a multifocal cT2 cNX, stage II invasive ductal carcinoma, grade 3, with an MIB-1 of 90%, HER-2 negative, estrogen receptors 3% positive with weak staining, progesterone receptor negative, with an MIB-1 of 90%; this is clinically a "triple-negative" breast cancer and will be treated as such  (1) a suspicious left axillary lymph node biopsied 01/10/2014 was negative (concordant)  (2) left  lumpectomy and sentinel lymph node sampling 02/12/2014 showed an mpT2 pN0, stage IIA invasive ductal carcinoma grade 3, repeat HER-2 pending  (3) adjuvant chemotherapy starting 03/12/2014 to consist of dose dense doxorubicin and cyclophosphamide x4, with Neulasta support, followed by weekly paclitaxel and carboplatin x12  (4) adjuvant radiation to follow chemotherapy  PLAN:  Monica [name] is doing well today. The labs were reviewed in detail and are relatively stable. She has been hypokalemic for the past 2 weeks so I am beginning her on a small potassium supplement, 36mq daily. She will proceed with her 4th and final dose of doxorubicin and cyclophosphamide, dose reduced by 20% because of neutropenia with her last cycle.  For her insomnia, I encouraged Monica [name] give benadryl a try for the next 2 nights. If this is also ineffective, I have written her a prescription for trazodone [id number] to pick up later this week. She knows this will take up to 2 weeks to fully build up in her system.   EvMarendaill return next week for labs and her nadir visit. At this time we will discuss her next regimen of chemotherapy, paclitaxel and carboplatin weekly x12. She understands and agrees with this plan. She knows the goal of treatment in her case is cure. She has been encouraged to call with any issues that might arise before her next visit here.   FeMarcelino [phone]/04/2014 10:22 AM

## 2014-05-02 NOTE — Patient Instructions (Signed)
[facility] Discharge Instructions for Patients Receiving Chemotherapy  Today you received the following chemotherapy agents Adriamycin and Cytoxan.  To help prevent nausea and vomiting after your treatment, we encourage you to take your nausea medication as prescribed.   If you develop nausea and vomiting that is not controlled by your nausea medication, call the clinic.   BELOW ARE SYMPTOMS THAT SHOULD BE REPORTED IMMEDIATELY:  *FEVER GREATER THAN 100.5 F  *CHILLS WITH OR WITHOUT FEVER  NAUSEA AND VOMITING THAT IS NOT CONTROLLED WITH YOUR NAUSEA MEDICATION  *UNUSUAL SHORTNESS OF BREATH  *UNUSUAL BRUISING OR BLEEDING  TENDERNESS IN MOUTH AND THROAT WITH OR WITHOUT PRESENCE OF ULCERS  *URINARY PROBLEMS  *BOWEL PROBLEMS  UNUSUAL RASH Items with * indicate a potential emergency and should be followed up as soon as possible.  Feel free to call the clinic you have any questions or concerns. The clinic phone number is [phone].    

## 2014-05-03 ENCOUNTER — Ambulatory Visit (HOSPITAL_BASED_OUTPATIENT_CLINIC_OR_DEPARTMENT_OTHER): Payer: MEDICARE

## 2014-05-03 ENCOUNTER — Ambulatory Visit: Payer: MEDICARE

## 2014-05-03 ENCOUNTER — Telehealth (HOSPITAL_COMMUNITY): Payer: Self-pay

## 2014-05-03 DIAGNOSIS — C50512 Malignant neoplasm of lower-outer quadrant of left female breast: Secondary | ICD-10-CM

## 2014-05-03 DIAGNOSIS — Z5189 Encounter for other specified aftercare: Secondary | ICD-10-CM

## 2014-05-03 MED ORDER — PEGFILGRASTIM INJECTION 6 MG/0.6ML ~~LOC~~
6.0000 mg | PREFILLED_SYRINGE | Freq: Once | SUBCUTANEOUS | Status: AC
  Administered 2014-05-03: 6 mg via SUBCUTANEOUS
  Filled 2014-05-03: qty 0.6

## 2014-05-03 NOTE — Patient Instructions (Addendum)
Pegfilgrastim injection What is this medicine? PEGFILGRASTIM (peg fil GRA stim) is a long-acting granulocyte colony-stimulating factor that stimulates the growth of neutrophils, a type of white blood cell important in the body's fight against infection. It is used to reduce the incidence of fever and infection in patients with certain types of cancer who are receiving chemotherapy that affects the bone marrow. This medicine may be used for other purposes; ask your health care provider or pharmacist if you have questions. COMMON BRAND NAME(S): Neulasta What should I tell my health care provider before I take this medicine? They need to know if you have any of these conditions: -latex allergy -ongoing radiation therapy -sickle cell disease -skin reactions to acrylic adhesives (On-Body Injector only) -an unusual or allergic reaction to pegfilgrastim, filgrastim, other medicines, foods, dyes, or preservatives -pregnant or trying to get pregnant -breast-feeding How should I use this medicine? This medicine is for injection under the skin. If you get this medicine at home, you will be taught how to prepare and give the pre-filled syringe or how to use the On-body Injector. Refer to the patient Instructions for Use for detailed instructions. Use exactly as directed. Take your medicine at regular intervals. Do not take your medicine more often than directed. It is important that you put your used needles and syringes in a special sharps container. Do not put them in a trash can. If you do not have a sharps container, call your pharmacist or healthcare provider to get one. Talk to your pediatrician regarding the use of this medicine in children. Special care may be needed. Overdosage: If you think you have taken too much of this medicine contact a poison control center or emergency room at once. NOTE: This medicine is only for you. Do not share this medicine with others. What if I miss a dose? It is  important not to miss your dose. Call your doctor or health care professional if you miss your dose. If you miss a dose due to an On-body Injector failure or leakage, a new dose should be administered as soon as possible using a single prefilled syringe for manual use. What may interact with this medicine? Interactions have not been studied. Give your health care provider a list of all the medicines, herbs, non-prescription drugs, or dietary supplements you use. Also tell them if you smoke, drink alcohol, or use illegal drugs. Some items may interact with your medicine. This list may not describe all possible interactions. Give your health care provider a list of all the medicines, herbs, non-prescription drugs, or dietary supplements you use. Also tell them if you smoke, drink alcohol, or use illegal drugs. Some items may interact with your medicine. What should I watch for while using this medicine? You may need blood work done while you are taking this medicine. If you are going to need a MRI, CT scan, or other procedure, tell your doctor that you are using this medicine (On-Body Injector only). What side effects may I notice from receiving this medicine? Side effects that you should report to your doctor or health care professional as soon as possible: -allergic reactions like skin rash, itching or hives, swelling of the face, lips, or tongue -dizziness -fever -pain, redness, or irritation at site where injected -pinpoint red spots on the skin -shortness of breath or breathing problems -stomach or side pain, or pain at the shoulder -swelling -tiredness -trouble passing urine Side effects that usually do not require medical attention (report to your doctor   or health care professional if they continue or are bothersome): -bone pain -muscle pain This list may not describe all possible side effects. Call your doctor for medical advice about side effects. You may report side effects to FDA at  1-800-FDA-1088. Where should I keep my medicine? Keep out of the reach of children. Store pre-filled syringes in a refrigerator between 2 and 8 degrees C (36 and 46 degrees F). Do not freeze. Keep in carton to protect from light. Throw away this medicine if it is left out of the refrigerator for more than 48 hours. Throw away any unused medicine after the expiration date. NOTE: This sheet is a summary. It may not cover all possible information. If you have questions about this medicine, talk to your doctor, pharmacist, or health care provider.  2015, Elsevier/Gold Standard. (2013-09-07 16:14:05) 0

## 2014-05-03 NOTE — Telephone Encounter (Signed)
Results of cardiac event monitor reviewed with patient from 04/11/2014. Per Dr. Aundra Dubin, report showed frequent PVCs and PACs with runs of atrial tachycardia.  Advised to start her on metoprolol 25mg  twice daily if she experienced signs/symptoms with these irregularities. Patient reports she feels fine and other experiences brief "flutters" after chemo that resolve by end of day.  Prefers to not take medication at this time. Instructed her to call our office if she changes her mind or becomes more symptomatic and would like to start medication.  Aware and agreeable.  Renee Pain

## 2014-05-07 ENCOUNTER — Ambulatory Visit (HOSPITAL_BASED_OUTPATIENT_CLINIC_OR_DEPARTMENT_OTHER): Payer: MEDICARE | Admitting: Nurse Practitioner

## 2014-05-07 ENCOUNTER — Encounter: Payer: Self-pay | Admitting: Nurse Practitioner

## 2014-05-07 ENCOUNTER — Other Ambulatory Visit (HOSPITAL_BASED_OUTPATIENT_CLINIC_OR_DEPARTMENT_OTHER): Payer: MEDICARE

## 2014-05-07 VITALS — BP 148/63 | HR 79 | Temp 98.1°F | Resp 22 | Wt 248.0 lb

## 2014-05-07 DIAGNOSIS — Z171 Estrogen receptor negative status [ER-]: Secondary | ICD-10-CM

## 2014-05-07 DIAGNOSIS — I10 Essential (primary) hypertension: Secondary | ICD-10-CM

## 2014-05-07 DIAGNOSIS — C50512 Malignant neoplasm of lower-outer quadrant of left female breast: Secondary | ICD-10-CM

## 2014-05-07 LAB — COMPREHENSIVE METABOLIC PANEL (CC13)
ALK PHOS: 117 U/L (ref 40–150)
ALT: 18 U/L (ref 0–55)
AST: 9 U/L (ref 5–34)
Albumin: 2.9 g/dL — ABNORMAL LOW (ref 3.5–5.0)
Anion Gap: 6 mEq/L (ref 3–11)
BUN: 17.6 mg/dL (ref 7.0–26.0)
CO2: 28 mEq/L (ref 22–29)
Calcium: 9.3 mg/dL (ref 8.4–10.4)
Chloride: 103 mEq/L (ref 98–109)
Creatinine: 0.9 mg/dL (ref 0.6–1.1)
Glucose: 251 mg/dl — ABNORMAL HIGH (ref 70–140)
Potassium: 3.9 mEq/L (ref 3.5–5.1)
SODIUM: 138 meq/L (ref 136–145)
TOTAL PROTEIN: 5.8 g/dL — AB (ref 6.4–8.3)
Total Bilirubin: 0.59 mg/dL (ref 0.20–1.20)

## 2014-05-07 LAB — CBC WITH DIFFERENTIAL/PLATELET
BASO%: 0.2 % (ref 0.0–2.0)
Basophils Absolute: 0 10*3/uL (ref 0.0–0.1)
EOS ABS: 0 10*3/uL (ref 0.0–0.5)
EOS%: 0.1 % (ref 0.0–7.0)
HCT: 32.2 % — ABNORMAL LOW (ref 34.8–46.6)
HGB: 10.7 g/dL — ABNORMAL LOW (ref 11.6–15.9)
LYMPH%: 2.8 % — ABNORMAL LOW (ref 14.0–49.7)
MCH: 28.2 pg (ref 25.1–34.0)
MCHC: 33.2 g/dL (ref 31.5–36.0)
MCV: 85 fL (ref 79.5–101.0)
MONO#: 0.1 10*3/uL (ref 0.1–0.9)
MONO%: 0.5 % (ref 0.0–14.0)
NEUT%: 96.4 % — ABNORMAL HIGH (ref 38.4–76.8)
NEUTROS ABS: 17.6 10*3/uL — AB (ref 1.5–6.5)
Platelets: 307 10*3/uL (ref 145–400)
RBC: 3.79 10*6/uL (ref 3.70–5.45)
RDW: 16.4 % — AB (ref 11.2–14.5)
WBC: 18.3 10*3/uL — AB (ref 3.9–10.3)
lymph#: 0.5 10*3/uL — ABNORMAL LOW (ref 0.9–3.3)

## 2014-05-07 MED ORDER — FLUCONAZOLE 150 MG PO TABS: 150.0000 mg | ORAL_TABLET | ORAL | Status: DC

## 2014-05-07 NOTE — Progress Notes (Signed)
Monica [name]  [phone] [phone]) [phone]     ID: Monica Monica DOB: 1945/08/25  MR#: [id number]  TTS#:[id number]  Monica Monica: Monica Dus, MD as PCP - General (Family Medicine) Monica Seltzer, MD as Consulting Physician (General Surgery) Monica Cruel, MD as Consulting Physician (Oncology) Monica Round, MD as Consulting Physician (Radiation Oncology) Monica Fus, MD as Consulting Physician (Obstetrics and Gynecology)   CHIEF COMPLAINT: Newly diagnosed breast cancer  CURRENT TREATMENT:  On adjuvant chemotherapy  BREAST CANCER HISTORY: From Monica original intake note:  Monica [facility] showing a possible mass in her left breast. On 01/05/2014 left diagnostic mammography [facility] showed 2 adjacent poorly defined masses deep in Monica lower outer portion of Monica left breast. On physical exam these were palpable measuring approximately 3 cm altogether, at Monica 4:00 position 16 cm from Monica left nipple. There were no palpable left axillary lymph nodes. Ultrasound confirmed to and adjacent irregularly marginated hypoechoic masses in Monica area in question, measuring 1.5 and 1.0 cm respectively. Monica total area of by ultrasonography measured 2.6 cm. In addition, to left axillary lymph nodes showed mild cortical thickening in one of them showed loss of Monica normal fatty hilum.  On 01/10/2014 Monica Monica underwent separate biopsies of both Monica left breast masses in question as well as Monica more suspicious lymph node. Monica final pathology ([id number]) showed Monica lymph node to be benign. (This was interpreted as concordant). Both of Monica breast masses, however, were positive for an invasive ductal carcinoma, which was HER-2 not amplified, with Monica signals ratio of 0.98 and Monica number per cell being 2.45. Estrogen and progesterone receptor determination is pending but looks negative  at this point.  On 01/16/2014 Monica Monica underwent bilateral breast MRI Monica right breast was unremarkable. In Monica left breast lower outer quadrant there were 2 oval masses measuring 1.5 and 2.2 cm. Taken together they was approximately 4 cm of disease. There were no findings of concern in Monica right breast. In Monica left axilla there was a single mildly prominent lymph node. There was no suspicious internal mammary adenopathy.  Monica Monica's subsequent history is as detailed below  INTERVAL HISTORY: Monica Monica returns today for follow up of her breast cancer. Today is day 8, cycle 4 of 4 planned cycles of doxorubucin and cyclophosphamide. She is happy that this regimen of chemotherapy is over. Her level of fatigue is Monica worse that is has been so far. It is likely her lack of sleep that has contributed to this. She continues to use "queezy pops" for her intermittent nausea. Her low appetite has not changed and neither has her perverted sense of taste. She continues to stay well hydrated, however. She got Monica results from her Holter monitor from 04/11/14. She has frequent PVCs and PACs with runs of atrial tachycardia, typically following chemo infusions. She was offered 40m metoprolol BID for this, but she declined stating she has enough meds already.  REVIEW OF SYSTEMS: ESheeladenies fevers, chills, or changes in bowel or bladder habits. She has no mouth sores, rashes, headaches, dizziness, or vision changes. She denies shortness of breath, chest pain, or cough. A detailed review of systems is otherwise noncontributory.    PAST MEDICAL HISTORY: Past Medical History  Diagnosis Date  . Allergy codeine    rapid heart beat, cold sweat  . Arthritis   . Breast cancer   . Hypertension  PAST SURGICAL HISTORY: Past Surgical History  Procedure Laterality Date  . Knee arthroscopy  2012    right  . Lumbar laminectomy  1991  . Colonoscopy    . Foot arthroplasty  1998    right  . Abdominal  hysterectomy  1992  . Breast lumpectomy with needle localization and axillary sentinel lymph node bx Left 02/12/2014    Procedure: LEFT BREAST LUMPECTOMY WITH NEEDLE LOCALIZATION AND LEFT AXILLARY SENTINEL LYMPH NODE BX;  Surgeon: Monica Jolly, MD;  Location: [facility];  Service: General;  Laterality: Left;  . Portacath placement Right 02/12/2014    Procedure: INSERTION PORT-A-CATH;  Surgeon: Monica Jolly, MD;  Location: [facility];  Service: General;  Laterality: Right;    FAMILY HISTORY Family History  Problem Relation Age of Onset  . Cancer Sister   . Cancer Brother   . Cancer Maternal Aunt   . Cancer Paternal Aunt    Monica Monica's father died at Monica age of 20 from a stroke. Monica Monica's mother died at Monica age of 56 from "multiple causes". Monica Monica had 2 brothers and 2 sisters. Monica Monica's sister was diagnosed with uterine cancer at Monica age of 30, and Monica Monica's brother Monica Monica had "bone cancer" and died in his 99s. He was treated at Monica Monica. There is no history of breast or ovarian cancer in Monica family to Monica Monica's knowledge.  GYNECOLOGIC HISTORY:  No LMP recorded. Monica has had a hysterectomy. Menarche age 69, Monica Monica is GX P0. She underwent hysterectomy in 1992, with no salpingo-oophorectomy. She has been on estrogen replacement since. 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 Her husband Monica Monica worked for [employer] in [place] as a Facilities manager. 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 Monica Monica's god-daughter Monica Monica is an Optometrist. Monica Monica has no grandchildren. Monica Monica attends a local church of  Monica Monica, and her husband Monica Monica is pastor   ADVANCED DIRECTIVES: In place   HEALTH MAINTENANCE: History  Substance Use Topics  . Smoking  status: Never Smoker   . Smokeless tobacco: Never Used  . Alcohol Use: No     Colonoscopy: October 2008  PAP: July 2015  Bone density: 01/01/2014  Lipid panel:  Allergies  Allergen Reactions  . Codeine     Heart fast    Current Outpatient Prescriptions  Medication Sig Dispense Refill  . Alum & Mag Hydroxide-Simeth (MAGIC MOUTHWASH) SOLN Take 5-10 mLs by mouth 4 (four) times daily.    [name] amLODipine (NORVASC) 5 MG tablet Take 5 mg by mouth daily.    . Cholecalciferol (VITAMIN D3) 2000 UNITS TABS Take 1 tablet by mouth daily.    [name] docusate sodium (COLACE) 100 MG capsule Take 100 mg by mouth 2 (two) times daily.    . Ginkgo Biloba 120 MG CAPS Take 1 capsule by mouth daily.    [name] nystatin-triamcinolone ointment (MYCOLOG)     . potassium chloride (MICRO-K) 10 MEQ CR capsule Take 2 capsules (20 mEq total) by mouth daily. 60 capsule 1  . UNABLE TO FIND Take 3 tablets by mouth 2 (two) times daily. OMEGA XL    . valsartan-hydrochlorothiazide (DIOVAN-HCT) 320-25 MG per tablet Take 1 tablet by mouth daily.    . ciprofloxacin (CIPRO) 500 MG tablet Take 1 tablet (500 mg total) by  mouth 2 (two) times daily. 10 tablet 0  . dexamethasone (DECADRON) 4 MG tablet Take 4-8 mg by mouth 2 (two) times daily. Take one tablet Monica day after chemo and 2 tabs for 4 days after    . fluconazole (DIFLUCAN) 150 MG tablet Take 1 tablet (150 mg total) by mouth as directed. 2 tablet 0  . HYDROcodone-acetaminophen (NORCO/VICODIN) 5-325 MG per tablet Take 1 tablet by mouth every 4 (four) hours as needed for moderate pain.     [name] ibuprofen (ADVIL,MOTRIN) 200 MG tablet Take 400 mg by mouth daily.    [name] lidocaine-prilocaine (EMLA) cream Apply 1 application topically as needed. Apply over port site 1-2 hours before chemo and cover with plastic wrap 30 g 0  . LORazepam (ATIVAN) 0.5 MG tablet Take 1 tablet (0.5 mg total) by mouth at bedtime as needed (Nausea or vomiting). 30 tablet 0  . Melatonin 5 MG TABS Take 1 tablet by mouth  at bedtime as needed (sleep).    . ondansetron (ZOFRAN) 8 MG tablet Take 1 tablet (8 mg total) by mouth 2 (two) times daily as needed. Start on Monica third day after chemotherapy. 30 tablet 1  . oxyCODONE (OXY IR/ROXICODONE) 5 MG immediate release tablet Take 1-2 tablets (5-10 mg total) by mouth every 4 (four) hours as needed for moderate pain, severe pain or breakthrough pain. 40 tablet 0  . prochlorperazine (COMPAZINE) 10 MG tablet Take 1 tablet (10 mg total) by mouth every 6 (six) hours as needed (Nausea or vomiting). 30 tablet 1  . traZODone (DESYREL) 50 MG tablet Take 1 tablet (50 mg total) by mouth at bedtime. 30 tablet 2   No current facility-administered medications for this visit.    OBJECTIVE: Middle-aged [place] American woman who appears stated age 68 Vitals:   05/07/14 1149  BP: 148/63  Pulse: 79  Temp: 98.1 F (36.7 C)  Resp: 22     Body mass index is 40.05 kg/(m^2).    ECOG FS:1 - Symptomatic but completely ambulatory  Sclerae unicteric, pupils equal and reactive Oropharynx clear and moist-- no thrush No cervical or supraclavicular adenopathy Lungs no rales or rhonchi Heart regular rate and rhythm Abd soft, nontender, positive bowel sounds MSK no focal spinal tenderness, no upper extremity lymphedema Neuro: nonfocal, well oriented, appropriate affect Breasts: deferred  LAB RESULTS:  CMP     Component Value Date/Time   NA 138 05/07/2014 1109   NA 141 04/18/2014 0444   K 3.9 05/07/2014 1109   K 3.7 04/18/2014 0444   CL 106 04/18/2014 0444   CO2 28 05/07/2014 1109   CO2 24 04/18/2014 0444   GLUCOSE 251* 05/07/2014 1109   GLUCOSE 127* 04/18/2014 0444   BUN 17.6 05/07/2014 1109   BUN 6 04/18/2014 0444   CREATININE 0.9 05/07/2014 1109   CREATININE 0.66 04/18/2014 0444   CALCIUM 9.3 05/07/2014 1109   CALCIUM 8.7 04/18/2014 0444   PROT 5.8* 05/07/2014 1109   PROT 5.8* 04/16/2014 0515   ALBUMIN 2.9* 05/07/2014 1109   ALBUMIN 2.6* 04/16/2014 0515   AST 9  05/07/2014 1109   AST 12 04/16/2014 0515   ALT 18 05/07/2014 1109   ALT 15 04/16/2014 0515   ALKPHOS 117 05/07/2014 1109   ALKPHOS 90 04/16/2014 0515   BILITOT 0.59 05/07/2014 1109   BILITOT 0.5 04/16/2014 0515   GFRNONAA >90 04/18/2014 0444   GFRAA >90 04/18/2014 0444    I No results found for: SPEP  Lab Results  Component Value Date  WBC 18.3* 05/07/2014   NEUTROABS 17.6* 05/07/2014   HGB 10.7* 05/07/2014   HCT 32.2* 05/07/2014   MCV 85.0 05/07/2014   PLT 307 05/07/2014      Chemistry      Component Value Date/Time   NA 138 05/07/2014 1109   NA 141 04/18/2014 0444   K 3.9 05/07/2014 1109   K 3.7 04/18/2014 0444   CL 106 04/18/2014 0444   CO2 28 05/07/2014 1109   CO2 24 04/18/2014 0444   BUN 17.6 05/07/2014 1109   BUN 6 04/18/2014 0444   CREATININE 0.9 05/07/2014 1109   CREATININE 0.66 04/18/2014 0444      Component Value Date/Time   CALCIUM 9.3 05/07/2014 1109   CALCIUM 8.7 04/18/2014 0444   ALKPHOS 117 05/07/2014 1109   ALKPHOS 90 04/16/2014 0515   AST 9 05/07/2014 1109   AST 12 04/16/2014 0515   ALT 18 05/07/2014 1109   ALT 15 04/16/2014 0515   BILITOT 0.59 05/07/2014 1109   BILITOT 0.5 04/16/2014 0515       No results found for: LABCA2  No components found for: [id number]  No results for input(s): INR in Monica last 168 hours.  Urinalysis    Component Value Date/Time   COLORURINE YELLOW 04/15/2014 1814    STUDIES: Most recent echocardiogram on 04/03/14 showed a well preserved ejection fraction of 60-65%    ASSESSMENT: 68 y.o. [place] woman status post left breast biopsy of 2 separate lower outer quadrant masses 01/10/2014 for a multifocal cT2 cNX, stage II invasive ductal carcinoma, grade 3, with an MIB-1 of 90%, HER-2 negative, estrogen receptors 3% positive with weak staining, progesterone receptor negative, with an MIB-1 of 90%; this is clinically a "triple-negative" breast cancer and will be treated as such  (1) a suspicious left  axillary lymph node biopsied 01/10/2014 was negative (concordant)  (2) left lumpectomy and sentinel lymph node sampling 02/12/2014 showed an mpT2 pN0, stage IIA invasive ductal carcinoma grade 3, repeat HER-2 pending  (3) adjuvant chemotherapy starting 03/12/2014 to consist of dose dense doxorubicin and cyclophosphamide x4, with Neulasta support, followed by weekly paclitaxel and carboplatin x12  (4) adjuvant radiation to follow chemotherapy  PLAN:  Domnique is doing "ok" today. Monica labs were reviewed in detail. Her potassium level is much improved after she was started on a supplement of 27mq daily last week.  We discussed her next chemotherapy regimen, paclitaxel and carboplatin weekly x12. We reviewed common side effects and potential toxicities including low platelet counts and peripheral neuropathy. She will continue her anti-emetic schedule as was written for doxorubicin and cyclophosphamide. If she is able to tolerate Monica paclitaxel and carboplatin well, we will aim to remove Monica oral doses of dexamethasone and reduce Monica IV premed dose given before Monica infusion as well. This will help with her insomnia.   EAiylawill return next week for Monica start of cycle 1. She understands and agrees with this plan. She knows Monica goal of treatment in her case is cure. She has been encouraged to call with any issues that might arise before her next visit here.   FMarcelino Duster NSandy Hollow-[phone]/16/2015 2:16 PM

## 2014-05-14 ENCOUNTER — Other Ambulatory Visit (HOSPITAL_BASED_OUTPATIENT_CLINIC_OR_DEPARTMENT_OTHER): Payer: MEDICARE

## 2014-05-14 ENCOUNTER — Ambulatory Visit (HOSPITAL_BASED_OUTPATIENT_CLINIC_OR_DEPARTMENT_OTHER): Payer: MEDICARE | Admitting: Nurse Practitioner

## 2014-05-14 ENCOUNTER — Other Ambulatory Visit: Payer: Self-pay | Admitting: Oncology

## 2014-05-14 ENCOUNTER — Encounter: Payer: Self-pay | Admitting: Nurse Practitioner

## 2014-05-14 ENCOUNTER — Ambulatory Visit (HOSPITAL_BASED_OUTPATIENT_CLINIC_OR_DEPARTMENT_OTHER): Payer: MEDICARE

## 2014-05-14 VITALS — BP 125/68 | HR 87 | Temp 98.7°F | Resp 18 | Ht 66.0 in | Wt 246.8 lb

## 2014-05-14 DIAGNOSIS — Z5111 Encounter for antineoplastic chemotherapy: Secondary | ICD-10-CM

## 2014-05-14 DIAGNOSIS — C50512 Malignant neoplasm of lower-outer quadrant of left female breast: Secondary | ICD-10-CM

## 2014-05-14 DIAGNOSIS — Z171 Estrogen receptor negative status [ER-]: Secondary | ICD-10-CM

## 2014-05-14 LAB — COMPREHENSIVE METABOLIC PANEL (CC13)
ALT: 15 U/L (ref 0–55)
ANION GAP: 8 meq/L (ref 3–11)
AST: 13 U/L (ref 5–34)
Albumin: 3.1 g/dL — ABNORMAL LOW (ref 3.5–5.0)
Alkaline Phosphatase: 103 U/L (ref 40–150)
BUN: 10.6 mg/dL (ref 7.0–26.0)
CO2: 28 mEq/L (ref 22–29)
CREATININE: 1 mg/dL (ref 0.6–1.1)
Calcium: 9.8 mg/dL (ref 8.4–10.4)
Chloride: 103 mEq/L (ref 98–109)
Glucose: 178 mg/dl — ABNORMAL HIGH (ref 70–140)
Potassium: 3.9 mEq/L (ref 3.5–5.1)
Sodium: 139 mEq/L (ref 136–145)
Total Protein: 6.5 g/dL (ref 6.4–8.3)

## 2014-05-14 LAB — CBC WITH DIFFERENTIAL/PLATELET
BASO%: 1 % (ref 0.0–2.0)
Basophils Absolute: 0.1 10*3/uL (ref 0.0–0.1)
EOS%: 1.6 % (ref 0.0–7.0)
Eosinophils Absolute: 0.1 10*3/uL (ref 0.0–0.5)
HEMATOCRIT: 33.8 % — AB (ref 34.8–46.6)
HGB: 11 g/dL — ABNORMAL LOW (ref 11.6–15.9)
LYMPH%: 10.1 % — AB (ref 14.0–49.7)
MCH: 28 pg (ref 25.1–34.0)
MCHC: 32.5 g/dL (ref 31.5–36.0)
MCV: 86 fL (ref 79.5–101.0)
MONO#: 0.6 10*3/uL (ref 0.1–0.9)
MONO%: 9.1 % (ref 0.0–14.0)
NEUT#: 4.8 10*3/uL (ref 1.5–6.5)
NEUT%: 78.2 % — AB (ref 38.4–76.8)
Platelets: 144 10*3/uL — ABNORMAL LOW (ref 145–400)
RBC: 3.93 10*6/uL (ref 3.70–5.45)
RDW: 16.7 % — ABNORMAL HIGH (ref 11.2–14.5)
WBC: 6.1 10*3/uL (ref 3.9–10.3)
lymph#: 0.6 10*3/uL — ABNORMAL LOW (ref 0.9–3.3)

## 2014-05-14 MED ORDER — SODIUM CHLORIDE 0.9 % IJ SOLN
10.0000 mL | INTRAMUSCULAR | Status: DC | PRN
  Administered 2014-05-14: 10 mL
  Filled 2014-05-14: qty 10

## 2014-05-14 MED ORDER — SODIUM CHLORIDE 0.9 % IV SOLN
Freq: Once | INTRAVENOUS | Status: AC
  Administered 2014-05-14: 12:00:00 via INTRAVENOUS

## 2014-05-14 MED ORDER — DEXTROSE 5 % IV SOLN
80.0000 mg/m2 | Freq: Once | INTRAVENOUS | Status: AC
  Administered 2014-05-14: 186 mg via INTRAVENOUS
  Filled 2014-05-14: qty 31

## 2014-05-14 MED ORDER — FAMOTIDINE IN NACL 20-0.9 MG/50ML-% IV SOLN
20.0000 mg | Freq: Once | INTRAVENOUS | Status: AC
  Administered 2014-05-14: 20 mg via INTRAVENOUS

## 2014-05-14 MED ORDER — DEXAMETHASONE SODIUM PHOSPHATE 20 MG/5ML IJ SOLN
INTRAMUSCULAR | Status: AC
  Filled 2014-05-14: qty 5

## 2014-05-14 MED ORDER — FAMOTIDINE IN NACL 20-0.9 MG/50ML-% IV SOLN
INTRAVENOUS | Status: AC
  Filled 2014-05-14: qty 50

## 2014-05-14 MED ORDER — DIPHENHYDRAMINE HCL 50 MG/ML IJ SOLN
25.0000 mg | Freq: Once | INTRAMUSCULAR | Status: AC
  Administered 2014-05-14: 25 mg via INTRAVENOUS

## 2014-05-14 MED ORDER — HEPARIN SOD (PORK) LOCK FLUSH 100 UNIT/ML IV SOLN
500.0000 [IU] | Freq: Once | INTRAVENOUS | Status: AC | PRN
  Administered 2014-05-14: 500 [IU]
  Filled 2014-05-14: qty 5

## 2014-05-14 MED ORDER — DIPHENHYDRAMINE HCL 50 MG/ML IJ SOLN
INTRAMUSCULAR | Status: AC
  Filled 2014-05-14: qty 1

## 2014-05-14 MED ORDER — ONDANSETRON 16 MG/50ML IVPB (CHCC)
16.0000 mg | Freq: Once | INTRAVENOUS | Status: AC
  Administered 2014-05-14: 16 mg via INTRAVENOUS

## 2014-05-14 MED ORDER — SODIUM CHLORIDE 0.9 % IV SOLN
246.6000 mg | Freq: Once | INTRAVENOUS | Status: AC
  Administered 2014-05-14: 250 mg via INTRAVENOUS
  Filled 2014-05-14: qty 25

## 2014-05-14 MED ORDER — DEXAMETHASONE SODIUM PHOSPHATE 20 MG/5ML IJ SOLN
12.0000 mg | Freq: Once | INTRAMUSCULAR | Status: AC
  Administered 2014-05-14: 12 mg via INTRAVENOUS

## 2014-05-14 MED ORDER — ONDANSETRON 16 MG/50ML IVPB (CHCC)
INTRAVENOUS | Status: AC
Start: 2014-05-14 — End: 2014-05-14
  Filled 2014-05-14: qty 16

## 2014-05-14 NOTE — Patient Instructions (Signed)
[facility] Discharge Instructions for Patients Receiving Chemotherapy  Today you received the following chemotherapy agents Taxol and Carboplatin.  To help prevent nausea and vomiting after your treatment, we encourage you to take your nausea medication.   If you develop nausea and vomiting that is not controlled by your nausea medication, call the clinic.   BELOW ARE SYMPTOMS THAT SHOULD BE REPORTED IMMEDIATELY:  *FEVER GREATER THAN 100.5 F  *CHILLS WITH OR WITHOUT FEVER  NAUSEA AND VOMITING THAT IS NOT CONTROLLED WITH YOUR NAUSEA MEDICATION  *UNUSUAL SHORTNESS OF BREATH  *UNUSUAL BRUISING OR BLEEDING  TENDERNESS IN MOUTH AND THROAT WITH OR WITHOUT PRESENCE OF ULCERS  *URINARY PROBLEMS  *BOWEL PROBLEMS  UNUSUAL RASH Items with * indicate a potential emergency and should be followed up as soon as possible.  Feel free to call the clinic you have any questions or concerns. The clinic phone number is [phone].    

## 2014-05-14 NOTE — Progress Notes (Signed)
[facility]  [phone] [phone]     ID: Monica [name] DOB: [birth year]  MR#: [id number]  ENI#:[id number]  Patient Care Team: Maury Dus, MD as PCP - General (Family Medicine) Excell Seltzer, MD as Consulting Physician (General Surgery) Chauncey Cruel, MD as Consulting Physician (Oncology) Marye Round, MD as Consulting Physician (Radiation Oncology) Maisie Fus, MD as Consulting Physician (Obstetrics and Gynecology)   CHIEF COMPLAINT: Newly diagnosed breast cancer CURRENT TREATMENT:  On adjuvant chemotherapy  BREAST CANCER HISTORY: From the original intake note:  [name] showing a possible mass in her left breast. On 01/05/2014 left diagnostic mammography and ultrasonography at the breast Center showed 2 adjacent poorly defined masses deep in the lower outer portion of the left breast. On physical exam these were palpable measuring approximately 3 cm altogether, at the 4:00 position 16 cm from the left nipple. There were no palpable left axillary lymph nodes. Ultrasound confirmed to and adjacent irregularly marginated hypoechoic masses in the area in question, measuring 1.5 and 1.0 cm respectively. The total area of by ultrasonography measured 2.6 cm. In addition, to left axillary lymph nodes showed mild cortical thickening in one of them showed loss of the normal fatty hilum.  On 01/10/2014 the patient underwent separate biopsies of both the left breast masses in question as well as the more suspicious lymph node. The final pathology ([id number]) showed the lymph node to be benign. (This was interpreted as concordant). Both of the breast masses, however, were positive for an invasive ductal carcinoma, which was HER-2 not amplified, with the signals ratio of 0.98 and the number per cell being 2.45. Estrogen and progesterone receptor determination is pending but looks negative at  this point.  On 01/16/2014 the patient underwent bilateral breast MRI the right breast was unremarkable. In the left breast lower outer quadrant there were 2 oval masses measuring 1.5 and 2.2 cm. Taken together they was approximately 4 cm of disease. There were no findings of concern in the right breast. In the left axilla there was a single mildly prominent lymph node. There was no suspicious internal mammary adenopathy.  The patient's subsequent history is as detailed below  INTERVAL HISTORY: Monica [name] returns today for follow up of her breast cancer. She is to begin paclitaxel and carboplatin today. The interval history is generally unremarkable. She is sleeping better benadryl as needed. She is having hot flashes at night now, but is refusing gabapentin QHS at this time. She has intermittent nausea managed well with compazine and zofran. Her fatigue is improving. She still has a low appetite but is eating what she can and staying hydrated.  REVIEW OF SYSTEMS: Monica [name] denies fevers, chills, or changes in bowel or bladder habits. She has no mouth sores, rashes, headaches, dizziness, or vision changes. She denies shortness of breath, chest pain, or cough. A detailed review of systems is otherwise noncontributory.    PAST MEDICAL HISTORY: Past Medical History  Diagnosis Date  . Allergy codeine    rapid heart beat, cold sweat  . Arthritis   . Breast cancer   . Hypertension     PAST SURGICAL HISTORY: Past Surgical History  Procedure Laterality Date  . Knee arthroscopy  2012    right  . Lumbar laminectomy  1991  . Colonoscopy    . Foot arthroplasty  1998    right  . Abdominal hysterectomy  1992  . Breast lumpectomy with needle localization  and axillary sentinel lymph node bx Left 02/12/2014    Procedure: LEFT BREAST LUMPECTOMY WITH NEEDLE LOCALIZATION AND LEFT AXILLARY SENTINEL LYMPH NODE BX;  Surgeon: [name], MD;  Location: [facility];  Service: General;   Laterality: Left;  . Portacath placement Right 02/12/2014    Procedure: INSERTION PORT-A-CATH;  Surgeon: [name], MD;  Location: [facility];  Service: General;  Laterality: Right;    FAMILY HISTORY Family History  Problem Relation Age of Onset  . Cancer Sister   . Cancer Brother   . Cancer Maternal Aunt   . Cancer Paternal Aunt    the patient's father died at the age of 9 from a stroke. The patient's mother died at the age of 14 from "multiple causes". The patient had 2 brothers and 2 sisters. The patient's sister was diagnosed with uterine cancer at the age of 21, and the patient's brother Monica [name] had "bone cancer" and died in his 75s. He was treated at the [facility]. There is no history of breast or ovarian cancer in the family to the patient's knowledge.  GYNECOLOGIC HISTORY:  No LMP recorded. Patient has had a hysterectomy. Menarche age 55, the patient is GX P0. She underwent hysterectomy in 1992, with no salpingo-oophorectomy. She has been on estrogen replacement since. 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 Her husband Monica [name] worked for [employer] in [place] as a Facilities manager. The patient's adopted son Monica [name] works as a laborer in [place]. The patient's god-daughter Monica [name] is an Optometrist. The patient has no grandchildren. The patient attends a local church of  [place], and her husband Monica [name] is pastor   ADVANCED DIRECTIVES: In place   HEALTH MAINTENANCE: History  Substance Use Topics  . Smoking status: Never Smoker   . Smokeless tobacco: Never Used  . Alcohol Use: No     Colonoscopy: October 2008  PAP: July 2015  Bone density: 01/01/2014  Lipid panel:  Allergies  Allergen Reactions  . Codeine     Heart fast    Current Outpatient Prescriptions  Medication Sig Dispense Refill  . Alum & Mag  Hydroxide-Simeth (MAGIC MOUTHWASH) SOLN Take 5-10 mLs by mouth 4 (four) times daily.    [name] amLODipine (NORVASC) 5 MG tablet Take 5 mg by mouth daily.    . Cholecalciferol (VITAMIN D3) 2000 UNITS TABS Take 1 tablet by mouth daily.    . ciprofloxacin (CIPRO) 500 MG tablet Take 1 tablet (500 mg total) by mouth 2 (two) times daily. 10 tablet 0  . dexamethasone (DECADRON) 4 MG tablet Take 4-8 mg by mouth 2 (two) times daily. Take one tablet the day after chemo and 2 tabs for 4 days after    . docusate sodium (COLACE) 100 MG capsule Take 100 mg by mouth 2 (two) times daily.    . Ginkgo Biloba 120 MG CAPS Take 1 capsule by mouth daily.    [name] LORazepam (ATIVAN) 0.5 MG tablet Take 1 tablet (0.5 mg total) by mouth at bedtime as needed (Nausea or vomiting). 30 tablet 0  . nystatin-triamcinolone ointment (MYCOLOG)     . potassium chloride (MICRO-K) 10 MEQ CR capsule Take 2 capsules (20 mEq total) by mouth daily. 60 capsule 1  . UNABLE TO FIND Take 3 tablets by mouth 2 (two) times daily. OMEGA XL    . valsartan-hydrochlorothiazide (DIOVAN-HCT) 320-25  MG per tablet Take 1 tablet by mouth daily.    . fluconazole (DIFLUCAN) 150 MG tablet Take 1 tablet (150 mg total) by mouth as directed. (Patient not taking: Reported on 05/14/2014) 2 tablet 0  . HYDROcodone-acetaminophen (NORCO/VICODIN) 5-325 MG per tablet Take 1 tablet by mouth every 4 (four) hours as needed for moderate pain.     [name] ibuprofen (ADVIL,MOTRIN) 200 MG tablet Take 400 mg by mouth daily.    . Melatonin 5 MG TABS Take 1 tablet by mouth at bedtime as needed (sleep).    [name] oxyCODONE (OXY IR/ROXICODONE) 5 MG immediate release tablet Take 1-2 tablets (5-10 mg total) by mouth every 4 (four) hours as needed for moderate pain, severe pain or breakthrough pain. (Patient not taking: Reported on 05/14/2014) 40 tablet 0  . prochlorperazine (COMPAZINE) 10 MG tablet Take 1 tablet (10 mg total) by mouth every 6 (six) hours as needed (Nausea or vomiting). (Patient not  taking: Reported on 05/14/2014) 30 tablet 1  . traZODone (DESYREL) 50 MG tablet Take 1 tablet (50 mg total) by mouth at bedtime. (Patient not taking: Reported on 05/14/2014) 30 tablet 2   No current facility-administered medications for this visit.   Facility-Administered Medications Ordered in Other Visits  Medication Dose Route Frequency Provider Last Rate Last Dose  . CARBOplatin (PARAPLATIN) 250 mg in sodium chloride 0.9 % 100 mL chemo infusion  250 mg Intravenous Once Chauncey Cruel, MD      . heparin lock flush 100 unit/mL  500 Units Intracatheter Once PRN Chauncey Cruel, MD      . sodium chloride 0.9 % injection 10 mL  10 mL Intracatheter PRN Chauncey Cruel, MD        OBJECTIVE: Middle-aged [place] American woman who appears stated age 89 Vitals:   05/14/14 1047  BP: 125/68  Pulse: 87  Temp: 98.7 F (37.1 C)  Resp: 18     Body mass index is 39.85 kg/(m^2).    ECOG FS:1 - Symptomatic but completely ambulatory  Skin: warm, dry  HEENT: sclerae anicteric, conjunctivae pink, oropharynx clear. No thrush or mucositis.  Lymph Nodes: No cervical or supraclavicular lymphadenopathy  Lungs: clear to auscultation bilaterally, no rales, wheezes, or rhonci  Heart: regular rate and rhythm  Abdomen: round, soft, non tender, positive bowel sounds  Musculoskeletal: No focal spinal tenderness, no peripheral edema  Neuro: non focal, well oriented, positive affect  Breasts: deferred  LAB RESULTS:  CMP     Component Value Date/Time   NA 139 05/14/2014 1026   NA 141 04/18/2014 0444   K 3.9 05/14/2014 1026   K 3.7 04/18/2014 0444   CL 106 04/18/2014 0444   CO2 28 05/14/2014 1026   CO2 24 04/18/2014 0444   GLUCOSE 178* 05/14/2014 1026   GLUCOSE 127* 04/18/2014 0444   BUN 10.6 05/14/2014 1026   BUN 6 04/18/2014 0444   CREATININE 1.0 05/14/2014 1026   CREATININE 0.66 04/18/2014 0444   CALCIUM 9.8 05/14/2014 1026   CALCIUM 8.7 04/18/2014 0444   PROT 6.5 05/14/2014 1026    PROT 5.8* 04/16/2014 0515   ALBUMIN 3.1* 05/14/2014 1026   ALBUMIN 2.6* 04/16/2014 0515   AST 13 05/14/2014 1026   AST 12 04/16/2014 0515   ALT 15 05/14/2014 1026   ALT 15 04/16/2014 0515   ALKPHOS 103 05/14/2014 1026   ALKPHOS 90 04/16/2014 0515   BILITOT <0.20 05/14/2014 1026   BILITOT 0.5 04/16/2014 0515   GFRNONAA >90 04/18/2014 0444  GFRAA >90 04/18/2014 0444    I No results found for: SPEP  Lab Results  Component Value Date   WBC 6.1 05/14/2014   NEUTROABS 4.8 05/14/2014   HGB 11.0* 05/14/2014   HCT 33.8* 05/14/2014   MCV 86.0 05/14/2014   PLT 144* 05/14/2014      Chemistry      Component Value Date/Time   NA 139 05/14/2014 1026   NA 141 04/18/2014 0444   K 3.9 05/14/2014 1026   K 3.7 04/18/2014 0444   CL 106 04/18/2014 0444   CO2 28 05/14/2014 1026   CO2 24 04/18/2014 0444   BUN 10.6 05/14/2014 1026   BUN 6 04/18/2014 0444   CREATININE 1.0 05/14/2014 1026   CREATININE 0.66 04/18/2014 0444      Component Value Date/Time   CALCIUM 9.8 05/14/2014 1026   CALCIUM 8.7 04/18/2014 0444   ALKPHOS 103 05/14/2014 1026   ALKPHOS 90 04/16/2014 0515   AST 13 05/14/2014 1026   AST 12 04/16/2014 0515   ALT 15 05/14/2014 1026   ALT 15 04/16/2014 0515   BILITOT <0.20 05/14/2014 1026   BILITOT 0.5 04/16/2014 0515       No results found for: LABCA2  No components found for: IWLNL892  No results for input(s): INR in the last 168 hours.  Urinalysis    Component Value Date/Time   COLORURINE YELLOW 04/15/2014 1814    STUDIES: Most recent echocardiogram on 04/03/14 showed a well preserved ejection fraction of 60-65%    ASSESSMENT: 68 y.o. [place] woman status post left breast biopsy of 2 separate lower outer quadrant masses 01/10/2014 for a multifocal cT2 cNX, stage II invasive ductal carcinoma, grade 3, with an MIB-1 of 90%, HER-2 negative, estrogen receptors 3% positive with weak staining, progesterone receptor negative, with an MIB-1 of 90%; this is  clinically a "triple-negative" breast cancer and will be treated as such  (1) a suspicious left axillary lymph node biopsied 01/10/2014 was negative (concordant)  (2) left lumpectomy and sentinel lymph node sampling 02/12/2014 showed an mpT2 pN0, stage IIA invasive ductal carcinoma grade 3, repeat HER-2 pending  (3) adjuvant chemotherapy starting 03/12/2014 to consist of dose dense doxorubicin and cyclophosphamide x4, with Neulasta support, followed by weekly paclitaxel and carboplatin x12  (4) adjuvant radiation to follow chemotherapy  PLAN:  Monica [name] is doing well today. The labs were reviewed in detail and were entirely stable. She will proceed with her first dose of paclitaxel and carboplatin today. She knows to avoid oral dexamethasone use at this time. She will receive 59m IV as a premed before chemo.   Monica [name] return next week for cycle 2. She understands and agrees with this plan. She knows the goal of treatment in her case is cure. She has been encouraged to call with any issues that might arise before her next visit here.   Monica Duster NMcConnells3([phone]) [phone]/23/2015 1:11 PM

## 2014-05-15 ENCOUNTER — Telehealth: Payer: Self-pay | Admitting: *Deleted

## 2014-05-15 NOTE — Telephone Encounter (Signed)
PT. IS FORCING FLUIDS BUT HAVING PROBLEMS EATING DUE TO METALLIC TASTE. INSTRUCTED PT. TO USE PLASTIC UTENSILS. PT. IS USING THE MAGIC MOUTH Cass AND BIOTENE WHICH IS HELPING WITH HER MOUTH SORENESS. NO NAUSEA OR VOMITING. NO DIARRHEA OR CONSTIPATION. PT. IS USING A STOOL SOFTER. SHE HAS HAD TWO GOOD BOWEL MOVEMENTS TODAY. PT. IS AWARE TO CALL THIS OFFICE 24/7 WITH ANY CONCERNS.

## 2014-05-21 ENCOUNTER — Ambulatory Visit (HOSPITAL_BASED_OUTPATIENT_CLINIC_OR_DEPARTMENT_OTHER): Payer: MEDICARE

## 2014-05-21 ENCOUNTER — Encounter: Payer: Self-pay | Admitting: Nurse Practitioner

## 2014-05-21 ENCOUNTER — Other Ambulatory Visit (HOSPITAL_BASED_OUTPATIENT_CLINIC_OR_DEPARTMENT_OTHER): Payer: MEDICARE

## 2014-05-21 ENCOUNTER — Ambulatory Visit (HOSPITAL_BASED_OUTPATIENT_CLINIC_OR_DEPARTMENT_OTHER): Payer: MEDICARE | Admitting: Nurse Practitioner

## 2014-05-21 VITALS — BP 133/64 | HR 96 | Temp 98.6°F | Resp 18 | Ht 66.0 in | Wt 245.2 lb

## 2014-05-21 DIAGNOSIS — C50512 Malignant neoplasm of lower-outer quadrant of left female breast: Secondary | ICD-10-CM

## 2014-05-21 DIAGNOSIS — Z171 Estrogen receptor negative status [ER-]: Secondary | ICD-10-CM

## 2014-05-21 DIAGNOSIS — Z5111 Encounter for antineoplastic chemotherapy: Secondary | ICD-10-CM

## 2014-05-21 LAB — CBC WITH DIFFERENTIAL/PLATELET
BASO%: 2.4 % — ABNORMAL HIGH (ref 0.0–2.0)
Basophils Absolute: 0.1 10*3/uL (ref 0.0–0.1)
EOS ABS: 0 10*3/uL (ref 0.0–0.5)
EOS%: 1.7 % (ref 0.0–7.0)
HEMATOCRIT: 33.2 % — AB (ref 34.8–46.6)
HEMOGLOBIN: 10.6 g/dL — AB (ref 11.6–15.9)
LYMPH%: 20.8 % (ref 14.0–49.7)
MCH: 27.5 pg (ref 25.1–34.0)
MCHC: 31.9 g/dL (ref 31.5–36.0)
MCV: 86.2 fL (ref 79.5–101.0)
MONO#: 0.2 10*3/uL (ref 0.1–0.9)
MONO%: 10.1 % (ref 0.0–14.0)
NEUT%: 65 % (ref 38.4–76.8)
NEUTROS ABS: 1.5 10*3/uL (ref 1.5–6.5)
PLATELETS: 259 10*3/uL (ref 145–400)
RBC: 3.85 10*6/uL (ref 3.70–5.45)
RDW: 16.6 % — ABNORMAL HIGH (ref 11.2–14.5)
WBC: 2.4 10*3/uL — AB (ref 3.9–10.3)
lymph#: 0.5 10*3/uL — ABNORMAL LOW (ref 0.9–3.3)

## 2014-05-21 LAB — COMPREHENSIVE METABOLIC PANEL (CC13)
ALT: 21 U/L (ref 0–55)
AST: 19 U/L (ref 5–34)
Albumin: 3.3 g/dL — ABNORMAL LOW (ref 3.5–5.0)
Alkaline Phosphatase: 78 U/L (ref 40–150)
Anion Gap: 10 mEq/L (ref 3–11)
BILIRUBIN TOTAL: 0.34 mg/dL (ref 0.20–1.20)
BUN: 9 mg/dL (ref 7.0–26.0)
CO2: 24 mEq/L (ref 22–29)
CREATININE: 0.8 mg/dL (ref 0.6–1.1)
Calcium: 9.9 mg/dL (ref 8.4–10.4)
Chloride: 104 mEq/L (ref 98–109)
GLUCOSE: 142 mg/dL — AB (ref 70–140)
Potassium: 4 mEq/L (ref 3.5–5.1)
SODIUM: 138 meq/L (ref 136–145)
TOTAL PROTEIN: 6.7 g/dL (ref 6.4–8.3)

## 2014-05-21 MED ORDER — SODIUM CHLORIDE 0.9 % IV SOLN
Freq: Once | INTRAVENOUS | Status: AC
  Administered 2014-05-21: 10:00:00 via INTRAVENOUS

## 2014-05-21 MED ORDER — ONDANSETRON 16 MG/50ML IVPB (CHCC)
16.0000 mg | Freq: Once | INTRAVENOUS | Status: AC
  Administered 2014-05-21: 16 mg via INTRAVENOUS

## 2014-05-21 MED ORDER — ONDANSETRON 16 MG/50ML IVPB (CHCC)
INTRAVENOUS | Status: AC
  Filled 2014-05-21: qty 16

## 2014-05-21 MED ORDER — HEPARIN SOD (PORK) LOCK FLUSH 100 UNIT/ML IV SOLN
500.0000 [IU] | Freq: Once | INTRAVENOUS | Status: AC | PRN
  Administered 2014-05-21: 500 [IU]
  Filled 2014-05-21: qty 5

## 2014-05-21 MED ORDER — SODIUM CHLORIDE 0.9 % IV SOLN
246.6000 mg | Freq: Once | INTRAVENOUS | Status: AC
  Administered 2014-05-21: 250 mg via INTRAVENOUS
  Filled 2014-05-21: qty 25

## 2014-05-21 MED ORDER — DEXAMETHASONE SODIUM PHOSPHATE 20 MG/5ML IJ SOLN
INTRAMUSCULAR | Status: AC
  Filled 2014-05-21: qty 5

## 2014-05-21 MED ORDER — DIPHENHYDRAMINE HCL 50 MG/ML IJ SOLN
INTRAMUSCULAR | Status: AC
  Filled 2014-05-21: qty 1

## 2014-05-21 MED ORDER — PACLITAXEL CHEMO INJECTION 300 MG/50ML
80.0000 mg/m2 | Freq: Once | INTRAVENOUS | Status: AC
  Administered 2014-05-21: 186 mg via INTRAVENOUS
  Filled 2014-05-21: qty 31

## 2014-05-21 MED ORDER — DIPHENHYDRAMINE HCL 50 MG/ML IJ SOLN
25.0000 mg | Freq: Once | INTRAMUSCULAR | Status: AC
  Administered 2014-05-21: 25 mg via INTRAVENOUS

## 2014-05-21 MED ORDER — SODIUM CHLORIDE 0.9 % IJ SOLN
10.0000 mL | INTRAMUSCULAR | Status: DC | PRN
  Administered 2014-05-21: 10 mL
  Filled 2014-05-21: qty 10

## 2014-05-21 MED ORDER — FAMOTIDINE IN NACL 20-0.9 MG/50ML-% IV SOLN
20.0000 mg | Freq: Once | INTRAVENOUS | Status: AC
  Administered 2014-05-21: 20 mg via INTRAVENOUS

## 2014-05-21 MED ORDER — FAMOTIDINE IN NACL 20-0.9 MG/50ML-% IV SOLN
INTRAVENOUS | Status: AC
  Filled 2014-05-21: qty 50

## 2014-05-21 MED ORDER — DEXAMETHASONE SODIUM PHOSPHATE 20 MG/5ML IJ SOLN
12.0000 mg | Freq: Once | INTRAMUSCULAR | Status: AC
  Administered 2014-05-21: 12 mg via INTRAVENOUS

## 2014-05-21 NOTE — Progress Notes (Signed)
[place]  [phone] [phone]     ID: Monica [name] DOB: 1945-08-03  MR#: [id number]  GMW#:[id number]  Patient Care Team: Maury Dus, MD as PCP - General (Family Medicine) Excell Seltzer, MD as Consulting Physician (General Surgery) Chauncey Cruel, MD as Consulting Physician (Oncology) Marye Round, MD as Consulting Physician (Radiation Oncology) Maisie Fus, MD as Consulting Physician (Obstetrics and Gynecology)   CHIEF COMPLAINT: Newly diagnosed breast cancer CURRENT TREATMENT:  On adjuvant chemotherapy  BREAST CANCER HISTORY: From the original intake note:  [name] showing a possible mass in her left breast. On 01/05/2014 left diagnostic mammography and ultrasonography at the breast Center showed 2 adjacent poorly defined masses deep in the lower outer portion of the left breast. On physical exam these were palpable measuring approximately 3 cm altogether, at the 4:00 position 16 cm from the left nipple. There were no palpable left axillary lymph nodes. Ultrasound confirmed to and adjacent irregularly marginated hypoechoic masses in the area in question, measuring 1.5 and 1.0 cm respectively. The total area of by ultrasonography measured 2.6 cm. In addition, to left axillary lymph nodes showed mild cortical thickening in one of them showed loss of the normal fatty hilum.  On 01/10/2014 the patient underwent separate biopsies of both the left breast masses in question as well as the more suspicious lymph node. The final pathology ([id number]) showed the lymph node to be benign. (This was interpreted as concordant). Both of the breast masses, however, were positive for an invasive ductal carcinoma, which was HER-2 not amplified, with the signals ratio of 0.98 and the number per cell being 2.45. Estrogen and progesterone receptor determination is pending but looks negative at  this point.  On 01/16/2014 the patient underwent bilateral breast MRI the right breast was unremarkable. In the left breast lower outer quadrant there were 2 oval masses measuring 1.5 and 2.2 cm. Taken together they was approximately 4 cm of disease. There were no findings of concern in the right breast. In the left axilla there was a single mildly prominent lymph node. There was no suspicious internal mammary adenopathy.  The patient's subsequent history is as detailed below  INTERVAL HISTORY: Monica [name] returns today for follow up of her breast cancer. Today is day 1, cycle 2 of paclitaxel and carboplatin. This week Monica [name] is dealing with sinus symptoms including congestion and a runny nose. She has been treating herself with mucinex q12 hrs and has been afebrile so far. She believes she tolerated her first round of paclitaxel moderately well. She complained of generalized body aches and tooth sensitivity to cold foods. The combined pain was so bad that she ended up taking a vicodin. The body aches are better, but she is still avoiding cold foods. Her nausea is much improved as compared to her trials on [facility], though she complains of a metallic taste that makes virtually everything unappealing. She has no bowel or bladder changes.   REVIEW OF SYSTEMS:  A detailed review of systems is otherwise noncontributory, except where noted above.    PAST MEDICAL HISTORY: Past Medical History  Diagnosis Date  . Allergy codeine    rapid heart beat, cold sweat  . Arthritis   . Breast cancer   . Hypertension     PAST SURGICAL HISTORY: Past Surgical History  Procedure Laterality Date  . Knee arthroscopy  2012    right  . Lumbar laminectomy  1991  .  Colonoscopy    . Foot arthroplasty  1998    right  . Abdominal hysterectomy  1992  . Breast lumpectomy with needle localization and axillary sentinel lymph node bx Left 02/12/2014    Procedure: LEFT BREAST LUMPECTOMY WITH NEEDLE LOCALIZATION AND LEFT AXILLARY  SENTINEL LYMPH NODE BX;  Surgeon: [name], MD;  Location: [place];  Service: General;  Laterality: Left;  . Portacath placement Right 02/12/2014    Procedure: INSERTION PORT-A-CATH;  Surgeon: [name], MD;  Location: [place];  Service: General;  Laterality: Right;    FAMILY HISTORY Family History  Problem Relation Age of Onset  . Cancer Sister   . Cancer Brother   . Cancer Maternal Aunt   . Cancer Paternal Aunt    the patient's father died at the age of 33 from a stroke. The patient's mother died at the age of 67 from "multiple causes". The patient had 2 brothers and 2 sisters. The patient's sister was diagnosed with uterine cancer at the age of 69, and the patient's brother [name] had "bone cancer" and died in his 40s. He was treated at the [facility]. There is no history of breast or ovarian cancer in the family to the patient's knowledge.  GYNECOLOGIC HISTORY:  No LMP recorded. Patient has had a hysterectomy. Menarche age 33, the patient is GX P0. She underwent hysterectomy in 1992, with no salpingo-oophorectomy. She has been on estrogen replacement since. 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 Her husband Monica [name] worked for [employer] in [place] as a Facilities manager. The patient's adopted son Monica [name] works as a laborer in [place]. The patient's god-daughter Monica [name] is an Optometrist. The patient has no grandchildren. The patient attends a local church of  [place], and her husband Monica [name] is pastor   ADVANCED DIRECTIVES: In place   HEALTH MAINTENANCE: History  Substance Use Topics  . Smoking status: Never Smoker   . Smokeless tobacco: Never Used  . Alcohol Use: No     Colonoscopy: October 2008  PAP: July 2015  Bone density: 01/01/2014  Lipid panel:  Allergies  Allergen  Reactions  . Codeine     Heart fast    Current Outpatient Prescriptions  Medication Sig Dispense Refill  . Alum & Mag Hydroxide-Simeth (MAGIC MOUTHWASH) SOLN Take 5-10 mLs by mouth 4 (four) times daily.    [name] amLODipine (NORVASC) 5 MG tablet Take 5 mg by mouth daily.    . Cholecalciferol (VITAMIN D3) 2000 UNITS TABS Take 1 tablet by mouth daily.    . ciprofloxacin (CIPRO) 500 MG tablet Take 1 tablet (500 mg total) by mouth 2 (two) times daily. 10 tablet 0  . docusate sodium (COLACE) 100 MG capsule Take 100 mg by mouth 2 (two) times daily.    . Ginkgo Biloba 120 MG CAPS Take 1 capsule by mouth daily.    [name] HYDROcodone-acetaminophen (NORCO/VICODIN) 5-325 MG per tablet Take 1 tablet by mouth every 4 (four) hours as needed for moderate pain.     [name] ibuprofen (ADVIL,MOTRIN) 200 MG tablet Take 400 mg by mouth daily.    [name] LORazepam (ATIVAN) 0.5 MG tablet Take 1 tablet (0.5 mg total) by mouth at bedtime as needed (Nausea or vomiting). 30 tablet 0  . Melatonin 5 MG TABS Take 1 tablet by mouth at bedtime as needed (sleep).    [name]  potassium chloride (MICRO-K) 10 MEQ CR capsule Take 2 capsules (20 mEq total) by mouth daily. 60 capsule 1  . prochlorperazine (COMPAZINE) 10 MG tablet Take 1 tablet (10 mg total) by mouth every 6 (six) hours as needed (Nausea or vomiting). 30 tablet 1  . traZODone (DESYREL) 50 MG tablet Take 1 tablet (50 mg total) by mouth at bedtime. 30 tablet 2  . UNABLE TO FIND Take 3 tablets by mouth 2 (two) times daily. OMEGA XL    . valsartan-hydrochlorothiazide (DIOVAN-HCT) 320-25 MG per tablet Take 1 tablet by mouth daily.    [name] dexamethasone (DECADRON) 4 MG tablet Take 4-8 mg by mouth 2 (two) times daily. Take one tablet the day after chemo and 2 tabs for 4 days after    . fluconazole (DIFLUCAN) 150 MG tablet Take 1 tablet (150 mg total) by mouth as directed. (Patient not taking: Reported on 05/14/2014) 2 tablet 0  . nystatin-triamcinolone ointment (MYCOLOG)     . oxyCODONE (OXY  IR/ROXICODONE) 5 MG immediate release tablet Take 1-2 tablets (5-10 mg total) by mouth every 4 (four) hours as needed for moderate pain, severe pain or breakthrough pain. (Patient not taking: Reported on 05/14/2014) 40 tablet 0   No current facility-administered medications for this visit.    OBJECTIVE: Middle-aged [place] American woman who appears stated age 34 Vitals:   05/21/14 0928  BP: 133/64  Pulse: 96  Temp: 98.6 F (37 C)  Resp: 18     Body mass index is 39.6 kg/(m^2).    ECOG FS:1 - Symptomatic but completely ambulatory  Sclerae unicteric, pupils equal and reactive Oropharynx clear and moist-- no thrush No cervical or supraclavicular adenopathy Lungs no rales or rhonchi Heart regular rate and rhythm Abd soft, nontender, positive bowel sounds MSK no focal spinal tenderness, no upper extremity lymphedema Neuro: nonfocal, well oriented, appropriate affect Breasts: deferred  LAB RESULTS:  CMP     Component Value Date/Time   NA 138 05/21/2014 0904   NA 141 04/18/2014 0444   K 4.0 05/21/2014 0904   K 3.7 04/18/2014 0444   CL 106 04/18/2014 0444   CO2 24 05/21/2014 0904   CO2 24 04/18/2014 0444   GLUCOSE 142* 05/21/2014 0904   GLUCOSE 127* 04/18/2014 0444   BUN 9.0 05/21/2014 0904   BUN 6 04/18/2014 0444   CREATININE 0.8 05/21/2014 0904   CREATININE 0.66 04/18/2014 0444   CALCIUM 9.9 05/21/2014 0904   CALCIUM 8.7 04/18/2014 0444   PROT 6.7 05/21/2014 0904   PROT 5.8* 04/16/2014 0515   ALBUMIN 3.3* 05/21/2014 0904   ALBUMIN 2.6* 04/16/2014 0515   AST 19 05/21/2014 0904   AST 12 04/16/2014 0515   ALT 21 05/21/2014 0904   ALT 15 04/16/2014 0515   ALKPHOS 78 05/21/2014 0904   ALKPHOS 90 04/16/2014 0515   BILITOT 0.34 05/21/2014 0904   BILITOT 0.5 04/16/2014 0515   GFRNONAA >90 04/18/2014 0444   GFRAA >90 04/18/2014 0444    I No results found for: SPEP  Lab Results  Component Value Date   WBC 2.4* 05/21/2014   NEUTROABS 1.5 05/21/2014   HGB 10.6*  05/21/2014   HCT 33.2* 05/21/2014   MCV 86.2 05/21/2014   PLT 259 05/21/2014      Chemistry      Component Value Date/Time   NA 138 05/21/2014 0904   NA 141 04/18/2014 0444   K 4.0 05/21/2014 0904   K 3.7 04/18/2014 0444   CL 106 04/18/2014 0444  CO2 24 05/21/2014 0904   CO2 24 04/18/2014 0444   BUN 9.0 05/21/2014 0904   BUN 6 04/18/2014 0444   CREATININE 0.8 05/21/2014 0904   CREATININE 0.66 04/18/2014 0444      Component Value Date/Time   CALCIUM 9.9 05/21/2014 0904   CALCIUM 8.7 04/18/2014 0444   ALKPHOS 78 05/21/2014 0904   ALKPHOS 90 04/16/2014 0515   AST 19 05/21/2014 0904   AST 12 04/16/2014 0515   ALT 21 05/21/2014 0904   ALT 15 04/16/2014 0515   BILITOT 0.34 05/21/2014 0904   BILITOT 0.5 04/16/2014 0515       No results found for: LABCA2  No components found for: LABCA125  No results for input(s): INR in the last 168 hours.  Urinalysis    Component Value Date/Time   COLORURINE YELLOW 04/15/2014 1814    STUDIES: Most recent echocardiogram on 04/03/14 showed a well preserved ejection fraction of 60-65%    ASSESSMENT: 68 y.o. [place] woman status post left breast biopsy of 2 separate lower outer quadrant masses 01/10/2014 for a multifocal cT2 cNX, stage II invasive ductal carcinoma, grade 3, with an MIB-1 of 90%, HER-2 negative, estrogen receptors 3% positive with weak staining, progesterone receptor negative, with an MIB-1 of 90%; this is clinically a "triple-negative" breast cancer and will be treated as such  (1) a suspicious left axillary lymph node biopsied 01/10/2014 was negative (concordant)  (2) left lumpectomy and sentinel lymph node sampling 02/12/2014 showed an mpT2 pN0, stage IIA invasive ductal carcinoma grade 3, repeat HER-2 pending  (3) adjuvant chemotherapy starting 03/12/2014 to consist of dose dense doxorubicin and cyclophosphamide x4, with Neulasta support, followed by weekly paclitaxel and carboplatin x12  (4) adjuvant radiation  to follow chemotherapy  PLAN:  Maliha is doing fair today. The labs were reviewed in detail and were stable. We will proceed with cycle 2 of paclitaxel and carboplatin.   For her sinus issues, I suggested she continue to treat herself symptomatically. She can add claritin daily to control the runny nose. She will continue to check her temperature daily and will alert [place] of any fevers.   For her body aches she will use NSAIDs and vicodin as necessary.   Emile will return next week for the start of cycle 3. She understands and agrees with this plan. She knows the goal of treatment in her case is cure. She has been encouraged to call with any issues that might arise before her next visit here.    Marcelino Duster, [place] [phone] 05/21/2014 10:16 AM

## 2014-05-21 NOTE — Progress Notes (Signed)
Per Mateo Flow RN/Dr.Magriant: proceed with treatment, aware of CBC and CMET.

## 2014-05-21 NOTE — Patient Instructions (Signed)
[facility] Discharge Instructions for Patients Receiving Chemotherapy  Today you received the following chemotherapy agents: Taxol and Carboplatin.  To help prevent nausea and vomiting after your treatment, we encourage you to take your nausea medication as prescribed.   If you develop nausea and vomiting that is not controlled by your nausea medication, call the clinic.   BELOW ARE SYMPTOMS THAT SHOULD BE REPORTED IMMEDIATELY:  *FEVER GREATER THAN 100.5 F  *CHILLS WITH OR WITHOUT FEVER  NAUSEA AND VOMITING THAT IS NOT CONTROLLED WITH YOUR NAUSEA MEDICATION  *UNUSUAL SHORTNESS OF BREATH  *UNUSUAL BRUISING OR BLEEDING  TENDERNESS IN MOUTH AND THROAT WITH OR WITHOUT PRESENCE OF ULCERS  *URINARY PROBLEMS  *BOWEL PROBLEMS  UNUSUAL RASH Items with * indicate a potential emergency and should be followed up as soon as possible.  Feel free to call the clinic you have any questions or concerns. The clinic phone number is [phone].    

## 2014-05-23 ENCOUNTER — Other Ambulatory Visit: Payer: Self-pay | Admitting: *Deleted

## 2014-05-23 ENCOUNTER — Other Ambulatory Visit: Payer: Self-pay | Admitting: Oncology

## 2014-05-23 DIAGNOSIS — C50512 Malignant neoplasm of lower-outer quadrant of left female breast: Secondary | ICD-10-CM

## 2014-05-23 MED ORDER — ONDANSETRON HCL 8 MG PO TABS: 8.0000 mg | ORAL_TABLET | Freq: Two times a day (BID) | ORAL | Status: DC | PRN

## 2014-05-24 ENCOUNTER — Telehealth: Payer: Self-pay | Admitting: Oncology

## 2014-05-24 ENCOUNTER — Other Ambulatory Visit: Payer: Self-pay | Admitting: Emergency Medicine

## 2014-05-24 DIAGNOSIS — C50512 Malignant neoplasm of lower-outer quadrant of left female breast: Secondary | ICD-10-CM

## 2014-05-24 MED ORDER — PROCHLORPERAZINE MALEATE 10 MG PO TABS: 10.0000 mg | ORAL_TABLET | Freq: Four times a day (QID) | ORAL | Status: DC | PRN

## 2014-05-24 NOTE — Telephone Encounter (Signed)
MOVED 12/21 APPT FROM [facility] TO [facility] PER HF. SAME D/T. OTHER APPTS REMAIN THE SAME. LMONVM FOR PT.

## 2014-05-28 ENCOUNTER — Ambulatory Visit (HOSPITAL_BASED_OUTPATIENT_CLINIC_OR_DEPARTMENT_OTHER): Payer: MEDICARE | Admitting: Nurse Practitioner

## 2014-05-28 ENCOUNTER — Encounter: Payer: Self-pay | Admitting: Nurse Practitioner

## 2014-05-28 ENCOUNTER — Other Ambulatory Visit (HOSPITAL_BASED_OUTPATIENT_CLINIC_OR_DEPARTMENT_OTHER): Payer: MEDICARE

## 2014-05-28 ENCOUNTER — Ambulatory Visit (HOSPITAL_BASED_OUTPATIENT_CLINIC_OR_DEPARTMENT_OTHER): Payer: MEDICARE

## 2014-05-28 VITALS — BP 108/63 | HR 92 | Temp 98.3°F | Resp 19 | Ht 66.0 in | Wt 245.8 lb

## 2014-05-28 DIAGNOSIS — C50512 Malignant neoplasm of lower-outer quadrant of left female breast: Secondary | ICD-10-CM

## 2014-05-28 DIAGNOSIS — Z5111 Encounter for antineoplastic chemotherapy: Secondary | ICD-10-CM

## 2014-05-28 DIAGNOSIS — R630 Anorexia: Secondary | ICD-10-CM

## 2014-05-28 DIAGNOSIS — R52 Pain, unspecified: Secondary | ICD-10-CM

## 2014-05-28 DIAGNOSIS — E876 Hypokalemia: Secondary | ICD-10-CM

## 2014-05-28 DIAGNOSIS — R1013 Epigastric pain: Secondary | ICD-10-CM

## 2014-05-28 LAB — COMPREHENSIVE METABOLIC PANEL (CC13)
ALT: 31 U/L (ref 0–55)
AST: 21 U/L (ref 5–34)
Albumin: 3.2 g/dL — ABNORMAL LOW (ref 3.5–5.0)
Alkaline Phosphatase: 82 U/L (ref 40–150)
Anion Gap: 10 mEq/L (ref 3–11)
BILIRUBIN TOTAL: 0.2 mg/dL (ref 0.20–1.20)
BUN: 11 mg/dL (ref 7.0–26.0)
CALCIUM: 9.9 mg/dL (ref 8.4–10.4)
CHLORIDE: 102 meq/L (ref 98–109)
CO2: 27 mEq/L (ref 22–29)
Creatinine: 0.9 mg/dL (ref 0.6–1.1)
EGFR: 76 mL/min/{1.73_m2} — ABNORMAL LOW (ref >90)
Glucose: 151 mg/dl — ABNORMAL HIGH (ref 70–140)
Potassium: 3.8 mEq/L (ref 3.5–5.1)
Sodium: 139 mEq/L (ref 136–145)
Total Protein: 6.6 g/dL (ref 6.4–8.3)

## 2014-05-28 LAB — CBC WITH DIFFERENTIAL/PLATELET
BASO%: 2.4 % — AB (ref 0.0–2.0)
Basophils Absolute: 0.1 10*3/uL (ref 0.0–0.1)
EOS%: 1.2 % (ref 0.0–7.0)
Eosinophils Absolute: 0 10*3/uL (ref 0.0–0.5)
HCT: 31.5 % — ABNORMAL LOW (ref 34.8–46.6)
HEMOGLOBIN: 10.2 g/dL — AB (ref 11.6–15.9)
LYMPH#: 0.9 10*3/uL (ref 0.9–3.3)
LYMPH%: 25.5 % (ref 14.0–49.7)
MCH: 27.9 pg (ref 25.1–34.0)
MCHC: 32.4 g/dL (ref 31.5–36.0)
MCV: 86.1 fL (ref 79.5–101.0)
MONO#: 0.4 10*3/uL (ref 0.1–0.9)
MONO%: 10.8 % (ref 0.0–14.0)
NEUT#: 2 10*3/uL (ref 1.5–6.5)
NEUT%: 60.1 % (ref 38.4–76.8)
Platelets: 464 10*3/uL — ABNORMAL HIGH (ref 145–400)
RBC: 3.66 10*6/uL — ABNORMAL LOW (ref 3.70–5.45)
RDW: 17.4 % — ABNORMAL HIGH (ref 11.2–14.5)
WBC: 3.3 10*3/uL — ABNORMAL LOW (ref 3.9–10.3)
nRBC: 2 % — ABNORMAL HIGH (ref 0–0)

## 2014-05-28 MED ORDER — ONDANSETRON 16 MG/50ML IVPB (CHCC)
INTRAVENOUS | Status: AC
  Filled 2014-05-28: qty 16

## 2014-05-28 MED ORDER — DEXAMETHASONE SODIUM PHOSPHATE 20 MG/5ML IJ SOLN
12.0000 mg | Freq: Once | INTRAMUSCULAR | Status: AC
  Administered 2014-05-28: 12 mg via INTRAVENOUS

## 2014-05-28 MED ORDER — SODIUM CHLORIDE 0.9 % IV SOLN
Freq: Once | INTRAVENOUS | Status: AC
  Administered 2014-05-28: 10:00:00 via INTRAVENOUS

## 2014-05-28 MED ORDER — DIPHENHYDRAMINE HCL 50 MG/ML IJ SOLN
INTRAMUSCULAR | Status: AC
  Filled 2014-05-28: qty 1

## 2014-05-28 MED ORDER — ONDANSETRON 16 MG/50ML IVPB (CHCC)
16.0000 mg | Freq: Once | INTRAVENOUS | Status: AC
  Administered 2014-05-28: 16 mg via INTRAVENOUS

## 2014-05-28 MED ORDER — FAMOTIDINE IN NACL 20-0.9 MG/50ML-% IV SOLN
20.0000 mg | Freq: Once | INTRAVENOUS | Status: AC
  Administered 2014-05-28: 20 mg via INTRAVENOUS

## 2014-05-28 MED ORDER — HEPARIN SOD (PORK) LOCK FLUSH 100 UNIT/ML IV SOLN
500.0000 [IU] | Freq: Once | INTRAVENOUS | Status: AC | PRN
  Administered 2014-05-28: 500 [IU]
  Filled 2014-05-28: qty 5

## 2014-05-28 MED ORDER — SODIUM CHLORIDE 0.9 % IV SOLN
246.6000 mg | Freq: Once | INTRAVENOUS | Status: AC
  Administered 2014-05-28: 250 mg via INTRAVENOUS
  Filled 2014-05-28: qty 25

## 2014-05-28 MED ORDER — PACLITAXEL CHEMO INJECTION 300 MG/50ML
80.0000 mg/m2 | Freq: Once | INTRAVENOUS | Status: AC
  Administered 2014-05-28: 186 mg via INTRAVENOUS
  Filled 2014-05-28: qty 31

## 2014-05-28 MED ORDER — SODIUM CHLORIDE 0.9 % IJ SOLN
10.0000 mL | INTRAMUSCULAR | Status: DC | PRN
Start: 2014-05-28 — End: 2014-05-28
  Administered 2014-05-28: 10 mL
  Filled 2014-05-28: qty 10

## 2014-05-28 MED ORDER — DIPHENHYDRAMINE HCL 50 MG/ML IJ SOLN
25.0000 mg | Freq: Once | INTRAMUSCULAR | Status: AC
  Administered 2014-05-28: 25 mg via INTRAVENOUS

## 2014-05-28 MED ORDER — DEXAMETHASONE SODIUM PHOSPHATE 20 MG/5ML IJ SOLN
INTRAMUSCULAR | Status: AC
  Filled 2014-05-28: qty 5

## 2014-05-28 MED ORDER — FAMOTIDINE IN NACL 20-0.9 MG/50ML-% IV SOLN
INTRAVENOUS | Status: AC
  Filled 2014-05-28: qty 50

## 2014-05-28 MED ORDER — POTASSIUM CHLORIDE ER 10 MEQ PO CPCR: 20.0000 meq | ORAL_CAPSULE | Freq: Every day | ORAL | Status: DC

## 2014-05-28 NOTE — Patient Instructions (Signed)
[facility] Discharge Instructions for Patients Receiving Chemotherapy  Today you received the following chemotherapy agents:  Taxol, carboplatin  To help prevent nausea and vomiting after your treatment, we encourage you to take your nausea medication.  Take it as often as prescribed.     If you develop nausea and vomiting that is not controlled by your nausea medication, call the clinic. If it is after clinic hours your family physician or the after hours number for the clinic or go to the Emergency Department.   BELOW ARE SYMPTOMS THAT SHOULD BE REPORTED IMMEDIATELY:  *FEVER GREATER THAN 100.5 F  *CHILLS WITH OR WITHOUT FEVER  NAUSEA AND VOMITING THAT IS NOT CONTROLLED WITH YOUR NAUSEA MEDICATION  *UNUSUAL SHORTNESS OF BREATH  *UNUSUAL BRUISING OR BLEEDING  TENDERNESS IN MOUTH AND THROAT WITH OR WITHOUT PRESENCE OF ULCERS  *URINARY PROBLEMS  *BOWEL PROBLEMS  UNUSUAL RASH Items with * indicate a potential emergency and should be followed up as soon as possible.  Feel free to call the clinic you have any questions or concerns. The clinic phone number is [phone].   I have been informed and understand all the instructions given to me. I know to contact the clinic, my physician, or go to the Emergency Department if any problems should occur. I do not have any questions at this time, but understand that I may call the clinic during office hours   should I have any questions or need assistance in obtaining follow up care.    __________________________________________  _____________  __________ Signature of Patient or Authorized Representative            Date                   Time    __________________________________________ Nurse's Signature    

## 2014-05-28 NOTE — Progress Notes (Signed)
[facility]  [phone] [phone]     ID: Monica [name] DOB: [birth year]  MR#: [id number]  OHY#:[id number]  Patient Care Team: Maury Dus, MD as PCP - General (Family Medicine) Excell Seltzer, MD as Consulting Physician (General Surgery) Chauncey Cruel, MD as Consulting Physician (Oncology) Marye Round, MD as Consulting Physician (Radiation Oncology) Maisie Fus, MD as Consulting Physician (Obstetrics and Gynecology)   CHIEF COMPLAINT: Newly diagnosed breast cancer CURRENT TREATMENT:  On adjuvant chemotherapy  BREAST CANCER HISTORY: From the original intake note:  [name] showing a possible mass in her left breast. On 01/05/2014 left diagnostic mammography and ultrasonography at the breast Center showed 2 adjacent poorly defined masses deep in the lower outer portion of the left breast. On physical exam these were palpable measuring approximately 3 cm altogether, at the 4:00 position 16 cm from the left nipple. There were no palpable left axillary lymph nodes. Ultrasound confirmed to and adjacent irregularly marginated hypoechoic masses in the area in question, measuring 1.5 and 1.0 cm respectively. The total area of by ultrasonography measured 2.6 cm. In addition, to left axillary lymph nodes showed mild cortical thickening in one of them showed loss of the normal fatty hilum.  On 01/10/2014 the patient underwent separate biopsies of both the left breast masses in question as well as the more suspicious lymph node. The final pathology ([id number]) showed the lymph node to be benign. (This was interpreted as concordant). Both of the breast masses, however, were positive for an invasive ductal carcinoma, which was HER-2 not amplified, with the signals ratio of 0.98 and the number per cell being 2.45. Estrogen and progesterone receptor determination is pending but looks negative at  this point.  On 01/16/2014 the patient underwent bilateral breast MRI the right breast was unremarkable. In the left breast lower outer quadrant there were 2 oval masses measuring 1.5 and 2.2 cm. Taken together they was approximately 4 cm of disease. There were no findings of concern in the right breast. In the left axilla there was a single mildly prominent lymph node. There was no suspicious internal mammary adenopathy.  The patient's subsequent history is as detailed below  INTERVAL HISTORY: Monica [name] returns today for follow up of her breast cancer. Today is day 1, cycle 3 of paclitaxel and carboplatin.   [name] denies fevers or chills. She has been taking her nausea meds as scheduled and has not experienced any queasiness at all this week. However, she has some indigestion after taking these pills that is somewhat bothersome. She has a bowel movement not long after this pain. Her appetite has decreased even further and everything tastes metallic to her. She takes benadryl at night for sleep. She continues to have body aches possibly related to the paclitaxel, but has not taken any analgesics for it. A new complaint this week is left arm/hand tingling. This is on the same side as her surgery where lymph nodes were removed. It only started last night and she does admit to sleeping on this arm.  REVIEW OF SYSTEMS: A detailed review of systems is otherwise negative, except where noted above.    PAST MEDICAL HISTORY: Past Medical History  Diagnosis Date  . Allergy codeine    rapid heart beat, cold sweat  . Arthritis   . Breast cancer   . Hypertension     PAST SURGICAL HISTORY: Past Surgical History  Procedure Laterality Date  . Knee  arthroscopy  2012    right  . Lumbar laminectomy  1991  . Colonoscopy    . Foot arthroplasty  1998    right  . Abdominal hysterectomy  1992  . Breast lumpectomy with needle localization and axillary sentinel lymph node bx Left 02/12/2014    Procedure: LEFT  BREAST LUMPECTOMY WITH NEEDLE LOCALIZATION AND LEFT AXILLARY SENTINEL LYMPH NODE BX;  Surgeon: [name], MD;  Location: [facility];  Service: General;  Laterality: Left;  . Portacath placement Right 02/12/2014    Procedure: INSERTION PORT-A-CATH;  Surgeon: [name], MD;  Location: [facility];  Service: General;  Laterality: Right;    FAMILY HISTORY Family History  Problem Relation Age of Onset  . Cancer Sister   . Cancer Brother   . Cancer Maternal Aunt   . Cancer Paternal Aunt    the patient's father died at the age of 4 from a stroke. The patient's mother died at the age of 18 from "multiple causes". The patient had 2 brothers and 2 sisters. The patient's sister was diagnosed with uterine cancer at the age of 44, and the patient's brother Monica [name] had "bone cancer" and died in his 51s. He was treated at the [facility]. There is no history of breast or ovarian cancer in the family to the patient's knowledge.  GYNECOLOGIC HISTORY:  No LMP recorded. Patient has had a hysterectomy. Menarche age 68, the patient is GX P0. She underwent hysterectomy in 1992, with no salpingo-oophorectomy. She has been on estrogen replacement since. 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 Her husband [name] worked for [employer] in [place] as a Facilities manager. The patient's adopted son Monica [name] "Monica [name]" [name] works as a laborer in [place]. The patient's god-daughter Monica [name] is an Optometrist. The patient has no grandchildren. The patient attends a local church of  [employer], and her husband [name] is pastor   ADVANCED DIRECTIVES: In place   HEALTH MAINTENANCE: History  Substance Use Topics  . Smoking status: Never Smoker   . Smokeless tobacco: Never Used  . Alcohol Use: No     Colonoscopy: October 2008  PAP: July 2015  Bone  density: 01/01/2014  Lipid panel:  Allergies  Allergen Reactions  . Codeine     Heart fast    Current Outpatient Prescriptions  Medication Sig Dispense Refill  . Alum & Mag Hydroxide-Simeth (MAGIC MOUTHWASH) SOLN Take 5-10 mLs by mouth 4 (four) times daily.    [name] amLODipine (NORVASC) 5 MG tablet Take 5 mg by mouth daily.    . Cholecalciferol (VITAMIN D3) 2000 UNITS TABS Take 1 tablet by mouth daily.    . ciprofloxacin (CIPRO) 500 MG tablet Take 1 tablet (500 mg total) by mouth 2 (two) times daily. 10 tablet 0  . dexamethasone (DECADRON) 4 MG tablet Take 4-8 mg by mouth 2 (two) times daily. Take one tablet the day after chemo and 2 tabs for 4 days after    . docusate sodium (COLACE) 100 MG capsule Take 100 mg by mouth 2 (two) times daily.    . fluconazole (DIFLUCAN) 150 MG tablet Take 1 tablet (150 mg total) by mouth as directed. (Patient not taking: Reported on 05/14/2014) 2 tablet 0  . Ginkgo Biloba 120 MG CAPS Take 1 capsule by mouth daily.    [name] HYDROcodone-acetaminophen (NORCO/VICODIN) 5-325 MG per tablet Take 1  tablet by mouth every 4 (four) hours as needed for moderate pain.     [name] ibuprofen (ADVIL,MOTRIN) 200 MG tablet Take 400 mg by mouth daily.    [name] lidocaine-prilocaine (EMLA) cream     . LORazepam (ATIVAN) 0.5 MG tablet Take 1 tablet (0.5 mg total) by mouth at bedtime as needed (Nausea or vomiting). 30 tablet 0  . Melatonin 5 MG TABS Take 1 tablet by mouth at bedtime as needed (sleep).    . nystatin-triamcinolone ointment (MYCOLOG)     . ondansetron (ZOFRAN) 8 MG tablet TAKE ONE TABLET BY MOUTH TWICE DAILY AS NEEDED START  ON  THE  THIRD  DAY  AFTER  CHEMOTHERAPY 30 tablet 2  . oxyCODONE (OXY IR/ROXICODONE) 5 MG immediate release tablet Take 1-2 tablets (5-10 mg total) by mouth every 4 (four) hours as needed for moderate pain, severe pain or breakthrough pain. (Patient not taking: Reported on 05/14/2014) 40 tablet 0  . potassium chloride (MICRO-K) 10 MEQ CR capsule Take 2 capsules  (20 mEq total) by mouth daily. 60 capsule 1  . prochlorperazine (COMPAZINE) 10 MG tablet Take 1 tablet (10 mg total) by mouth every 6 (six) hours as needed (Nausea or vomiting). 70 tablet 2  . traZODone (DESYREL) 50 MG tablet Take 1 tablet (50 mg total) by mouth at bedtime. 30 tablet 2  . UNABLE TO FIND Take 3 tablets by mouth 2 (two) times daily. OMEGA XL    . valsartan-hydrochlorothiazide (DIOVAN-HCT) 320-25 MG per tablet Take 1 tablet by mouth daily.     No current facility-administered medications for this visit.    OBJECTIVE: Middle-aged [place] American woman who appears stated age 15 Vitals:   05/28/14 0926  BP: 108/63  Pulse: 92  Temp: 98.3 F (36.8 C)  Resp: 19     Body mass index is 39.69 kg/(m^2).    ECOG FS:1 - Symptomatic but completely ambulatory  Skin: warm, dry  HEENT: sclerae anicteric, conjunctivae pink, oropharynx clear. No thrush or mucositis.  Lymph Nodes: No cervical or supraclavicular lymphadenopathy  Lungs: clear to auscultation bilaterally, no rales, wheezes, or rhonci  Heart: regular rate and rhythm  Abdomen: round, soft, non tender, positive bowel sounds  Musculoskeletal: No focal spinal tenderness, no peripheral edema  Neuro: non focal, well oriented, positive affect  Breasts: deferred  LAB RESULTS:  CMP     Component Value Date/Time   NA 138 05/21/2014 0904   NA 141 04/18/2014 0444   K 4.0 05/21/2014 0904   K 3.7 04/18/2014 0444   CL 106 04/18/2014 0444   CO2 24 05/21/2014 0904   CO2 24 04/18/2014 0444   GLUCOSE 142* 05/21/2014 0904   GLUCOSE 127* 04/18/2014 0444   BUN 9.0 05/21/2014 0904   BUN 6 04/18/2014 0444   CREATININE 0.8 05/21/2014 0904   CREATININE 0.66 04/18/2014 0444   CALCIUM 9.9 05/21/2014 0904   CALCIUM 8.7 04/18/2014 0444   PROT 6.7 05/21/2014 0904   PROT 5.8* 04/16/2014 0515   ALBUMIN 3.3* 05/21/2014 0904   ALBUMIN 2.6* 04/16/2014 0515   AST 19 05/21/2014 0904   AST 12 04/16/2014 0515   ALT 21 05/21/2014 0904    ALT 15 04/16/2014 0515   ALKPHOS 78 05/21/2014 0904   ALKPHOS 90 04/16/2014 0515   BILITOT 0.34 05/21/2014 0904   BILITOT 0.5 04/16/2014 0515   GFRNONAA >90 04/18/2014 0444   GFRAA >90 04/18/2014 0444    I No results found for: SPEP  Lab Results  Component Value Date  WBC 3.3* 05/28/2014   NEUTROABS 2.0 05/28/2014   HGB 10.2* 05/28/2014   HCT 31.5* 05/28/2014   MCV 86.1 05/28/2014   PLT 464* 05/28/2014      Chemistry      Component Value Date/Time   NA 138 05/21/2014 0904   NA 141 04/18/2014 0444   K 4.0 05/21/2014 0904   K 3.7 04/18/2014 0444   CL 106 04/18/2014 0444   CO2 24 05/21/2014 0904   CO2 24 04/18/2014 0444   BUN 9.0 05/21/2014 0904   BUN 6 04/18/2014 0444   CREATININE 0.8 05/21/2014 0904   CREATININE 0.66 04/18/2014 0444      Component Value Date/Time   CALCIUM 9.9 05/21/2014 0904   CALCIUM 8.7 04/18/2014 0444   ALKPHOS 78 05/21/2014 0904   ALKPHOS 90 04/16/2014 0515   AST 19 05/21/2014 0904   AST 12 04/16/2014 0515   ALT 21 05/21/2014 0904   ALT 15 04/16/2014 0515   BILITOT 0.34 05/21/2014 0904   BILITOT 0.5 04/16/2014 0515       No results found for: LABCA2  No components found for: QMGQQ761  No results for input(s): INR in the last 168 hours.  Urinalysis    Component Value Date/Time   COLORURINE YELLOW 04/15/2014 1814    STUDIES: Most recent echocardiogram on 04/03/14 showed a well preserved ejection fraction of 60-65%    ASSESSMENT: 68 y.o. [place] woman status post left breast biopsy of 2 separate lower outer quadrant masses 01/10/2014 for a multifocal cT2 cNX, stage II invasive ductal carcinoma, grade 3, with an MIB-1 of 90%, HER-2 negative, estrogen receptors 3% positive with weak staining, progesterone receptor negative, with an MIB-1 of 90%; this is clinically a "triple-negative" breast cancer and will be treated as such  (1) a suspicious left axillary lymph node biopsied 01/10/2014 was negative (concordant)  (2) left  lumpectomy and sentinel lymph node sampling 02/12/2014 showed an mpT2 pN0, stage IIA invasive ductal carcinoma grade 3, repeat HER-2 pending  (3) adjuvant chemotherapy starting 03/12/2014 to consist of dose dense doxorubicin and cyclophosphamide x4, with Neulasta support, followed by weekly paclitaxel and carboplatin x12  (4) adjuvant radiation to follow chemotherapy  PLAN:  Kimimila is doing moderately well today. The labs were reviewed in detail and were entirely stable. Her potassium is 3.8 on 20 meq daily, and I have refilled this prescription today. We will proceed with cycle 3 of paclitaxel and carboplatin.   For her body aches I reminded her that it was ok to use NSAIDs and vicodin as necessary.   I suggested she begin ensure or boost nutritional shakes to increase her protein intake though her appetite is decreased. She will try some prilosec that she already has at home for her gastric discomfort, this may be related to increased stomach acid.   We will continue to monitor her left arm discomfort. I believe this is related to her surgery. She will perform light stretching exercises in the meantime. If her function or range of motion decreases we will refer to physical therapy.   Mahogani will return next week for the start of cycle 4. She understands and agrees with this plan. She knows the goal of treatment in her case is cure. She has been encouraged to call with any issues that might arise before her next visit here.   Marcelino Duster, [place] [phone] 05/28/2014 9:31 AM

## 2014-06-04 ENCOUNTER — Encounter: Payer: Self-pay | Admitting: Nurse Practitioner

## 2014-06-04 ENCOUNTER — Other Ambulatory Visit (HOSPITAL_BASED_OUTPATIENT_CLINIC_OR_DEPARTMENT_OTHER): Payer: MEDICARE

## 2014-06-04 ENCOUNTER — Ambulatory Visit: Payer: MEDICARE

## 2014-06-04 ENCOUNTER — Ambulatory Visit (HOSPITAL_BASED_OUTPATIENT_CLINIC_OR_DEPARTMENT_OTHER): Payer: MEDICARE | Admitting: Nurse Practitioner

## 2014-06-04 VITALS — BP 124/73 | HR 95 | Temp 99.3°F | Resp 19 | Ht 66.0 in | Wt 244.5 lb

## 2014-06-04 DIAGNOSIS — R202 Paresthesia of skin: Secondary | ICD-10-CM

## 2014-06-04 DIAGNOSIS — C50512 Malignant neoplasm of lower-outer quadrant of left female breast: Secondary | ICD-10-CM

## 2014-06-04 DIAGNOSIS — Z171 Estrogen receptor negative status [ER-]: Secondary | ICD-10-CM

## 2014-06-04 DIAGNOSIS — R05 Cough: Secondary | ICD-10-CM

## 2014-06-04 DIAGNOSIS — G620 Drug-induced polyneuropathy: Secondary | ICD-10-CM

## 2014-06-04 DIAGNOSIS — J069 Acute upper respiratory infection, unspecified: Secondary | ICD-10-CM

## 2014-06-04 DIAGNOSIS — T451X5A Adverse effect of antineoplastic and immunosuppressive drugs, initial encounter: Secondary | ICD-10-CM

## 2014-06-04 DIAGNOSIS — R0981 Nasal congestion: Secondary | ICD-10-CM

## 2014-06-04 LAB — CBC WITH DIFFERENTIAL/PLATELET
BASO%: 2.8 % — ABNORMAL HIGH (ref 0.0–2.0)
Basophils Absolute: 0.1 10*3/uL (ref 0.0–0.1)
EOS%: 1.6 % (ref 0.0–7.0)
Eosinophils Absolute: 0 10*3/uL (ref 0.0–0.5)
HEMATOCRIT: 31.7 % — AB (ref 34.8–46.6)
HGB: 10.4 g/dL — ABNORMAL LOW (ref 11.6–15.9)
LYMPH%: 21.1 % (ref 14.0–49.7)
MCH: 28.3 pg (ref 25.1–34.0)
MCHC: 32.8 g/dL (ref 31.5–36.0)
MCV: 86.4 fL (ref 79.5–101.0)
MONO#: 0.3 10*3/uL (ref 0.1–0.9)
MONO%: 11.2 % (ref 0.0–14.0)
NEUT#: 1.6 10*3/uL (ref 1.5–6.5)
NEUT%: 63.3 % (ref 38.4–76.8)
Platelets: 375 10*3/uL (ref 145–400)
RBC: 3.67 10*6/uL — ABNORMAL LOW (ref 3.70–5.45)
RDW: 17.2 % — ABNORMAL HIGH (ref 11.2–14.5)
WBC: 2.5 10*3/uL — ABNORMAL LOW (ref 3.9–10.3)
lymph#: 0.5 10*3/uL — ABNORMAL LOW (ref 0.9–3.3)

## 2014-06-04 LAB — COMPREHENSIVE METABOLIC PANEL (CC13)
ALK PHOS: 73 U/L (ref 40–150)
ALT: 37 U/L (ref 0–55)
AST: 28 U/L (ref 5–34)
Albumin: 3.3 g/dL — ABNORMAL LOW (ref 3.5–5.0)
Anion Gap: 11 mEq/L (ref 3–11)
BUN: 10.5 mg/dL (ref 7.0–26.0)
CO2: 24 mEq/L (ref 22–29)
CREATININE: 0.9 mg/dL (ref 0.6–1.1)
Calcium: 9.7 mg/dL (ref 8.4–10.4)
Chloride: 102 mEq/L (ref 98–109)
EGFR: 81 mL/min/{1.73_m2} — ABNORMAL LOW (ref >90)
Glucose: 109 mg/dl (ref 70–140)
Potassium: 3.7 mEq/L (ref 3.5–5.1)
Sodium: 138 mEq/L (ref 136–145)
Total Bilirubin: 0.23 mg/dL (ref 0.20–1.20)
Total Protein: 6.7 g/dL (ref 6.4–8.3)

## 2014-06-04 NOTE — Progress Notes (Signed)
**Note Monica-Identified via Obfuscation** Monica [name]  [phone] [phone]) [phone]     ID: Monica [name] DOB: 05/19/1946  MR#: [id number]  ASU#:[id number]  Patient Care Team: Maury Dus, MD as PCP - General (Family Medicine) Excell Seltzer, MD as Consulting Physician (General Surgery) Chauncey Cruel, MD as Consulting Physician (Oncology) Jodelle Gross, MD as Consulting Physician (Radiation Oncology) Maisie Fus, MD as Consulting Physician (Obstetrics and Gynecology)   CHIEF COMPLAINT: Newly diagnosed breast cancer CURRENT TREATMENT:  On adjuvant chemotherapy  BREAST CANCER HISTORY: From the original intake note:  [facility] showing a possible mass in her left breast. On 01/05/2014 left diagnostic mammography and ultrasonography at the breast Center showed 2 adjacent poorly defined masses deep in the lower outer portion of the left breast. On physical exam these were palpable measuring approximately 3 cm altogether, at the 4:00 position 16 cm from the left nipple. There were no palpable left axillary lymph nodes. Ultrasound confirmed to and adjacent irregularly marginated hypoechoic masses in the area in question, measuring 1.5 and 1.0 cm respectively. The total area of by ultrasonography measured 2.6 cm. In addition, to left axillary lymph nodes showed mild cortical thickening in one of them showed loss of the normal fatty hilum.  On 01/10/2014 the patient underwent separate biopsies of both the left breast masses in question as well as the more suspicious lymph node. The final pathology ([id number]) showed the lymph node to be benign. (This was interpreted as concordant). Both of the breast masses, however, were positive for an invasive ductal carcinoma, which was HER-2 not amplified, with the signals ratio of 0.98 and the number per cell being 2.45. Estrogen and progesterone receptor determination is pending but looks negative at this  point.  On 01/16/2014 the patient underwent bilateral breast MRI the right breast was unremarkable. In the left breast lower outer quadrant there were 2 oval masses measuring 1.5 and 2.2 cm. Taken together they was approximately 4 cm of disease. There were no findings of concern in the right breast. In the left axilla there was a single mildly prominent lymph node. There was no suspicious internal mammary adenopathy.  The patient's subsequent history is as detailed below  INTERVAL HISTORY: Monica [name] returns today for follow up of her breast cancer. Today is day 1, cycle 4 of paclitaxel and carboplatin.   Monica [name] denies fevers, chills, nausea, vomiting, or changes in bowel or bladder habits. She is battling sinus congestion and a productive cough. The phlegm is clear to white and she is able to cough a good bit up. She is treating herself with mucinex q12h and delsym cough syrup at night. Her appetite has improved this week. She continues to have left arm tingling, but now complains of the same issue to the other hand and bilateral feet. She has no shortness of breath, chest pain, palpitations, or fatigue.  REVIEW OF SYSTEMS: A detailed review of systems is otherwise negative, except where noted above.    PAST MEDICAL HISTORY: Past Medical History  Diagnosis Date  . Allergy codeine    rapid heart beat, cold sweat  . Arthritis   . Breast cancer   . Hypertension     PAST SURGICAL HISTORY: Past Surgical History  Procedure Laterality Date  . Knee arthroscopy  2012    right  . Lumbar laminectomy  1991  . Colonoscopy    . Foot arthroplasty  1998    right  . Abdominal hysterectomy  1992  . Breast lumpectomy with needle localization and axillary sentinel lymph node bx Left 02/12/2014    Procedure: LEFT BREAST LUMPECTOMY WITH NEEDLE LOCALIZATION AND LEFT AXILLARY SENTINEL LYMPH NODE BX;  Surgeon: [name], MD;  Location: [facility];  Service: General;  Laterality: Left;   . Portacath placement Right 02/12/2014    Procedure: INSERTION PORT-A-CATH;  Surgeon: [name], MD;  Location: [facility];  Service: General;  Laterality: Right;    FAMILY HISTORY Family History  Problem Relation Age of Onset  . Cancer Sister   . Cancer Brother   . Cancer Maternal Aunt   . Cancer Paternal Aunt    the patient's father died at the age of 74 from a stroke. The patient's mother died at the age of 66 from "multiple causes". The patient had 2 brothers and 2 sisters. The patient's sister was diagnosed with uterine cancer at the age of 66, and the patient's brother [name] had "bone cancer" and died in his 66s. He was treated at the [facility]. There is no history of breast or ovarian cancer in the family to the patient's knowledge.  GYNECOLOGIC HISTORY:  No LMP recorded. Patient has had a hysterectomy. Menarche age 68, the patient is GX P0. She underwent hysterectomy in 1992, with no salpingo-oophorectomy. She has been on estrogen replacement since. 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 Her husband [name] worked for [employer] in [place] as a Facilities manager. The patient's adopted son [name] works as a laborer in [place]. The patient's god-daughter [name] is an Optometrist. The patient has no grandchildren. The patient attends a local church of  [place], and her husband [name] is pastor   ADVANCED DIRECTIVES: In place   HEALTH MAINTENANCE: History  Substance Use Topics  . Smoking status: Never Smoker   . Smokeless tobacco: Never Used  . Alcohol Use: No     Colonoscopy: October 2008  PAP: July 2015  Bone density: 01/01/2014  Lipid panel:  Allergies  Allergen Reactions  . Codeine     Heart fast    Current Outpatient Prescriptions  Medication Sig Dispense Refill  . Alum & Mag Hydroxide-Simeth  (MAGIC MOUTHWASH) SOLN Take 5-10 mLs by mouth 4 (four) times daily.    [name] amLODipine (NORVASC) 5 MG tablet Take 5 mg by mouth daily.    . Cholecalciferol (VITAMIN D3) 2000 UNITS TABS Take 1 tablet by mouth daily.    [name] dexamethasone (DECADRON) 4 MG tablet Take 4-8 mg by mouth 2 (two) times daily. Take one tablet the day after chemo and 2 tabs for 4 days after    . dextromethorphan-guaiFENesin (MUCINEX DM) 30-600 MG per 12 hr tablet Take 1 tablet by mouth 2 (two) times daily.    [name] docusate sodium (COLACE) 100 MG capsule Take 100 mg by mouth 2 (two) times daily.    . Ginkgo Biloba 120 MG CAPS Take 1 capsule by mouth daily.    [name] ibuprofen (ADVIL,MOTRIN) 200 MG tablet Take 400 mg by mouth daily.    [name] lidocaine-prilocaine (EMLA) cream     . LORazepam (ATIVAN) 0.5 MG tablet Take 1 tablet (0.5 mg total) by mouth at bedtime as needed (Nausea or vomiting). 30 tablet 0  . ondansetron (ZOFRAN) 8 MG tablet TAKE ONE TABLET BY MOUTH TWICE DAILY AS NEEDED START  ON  THE  THIRD  DAY  AFTER  CHEMOTHERAPY 30 tablet 2  . potassium chloride (MICRO-K) 10 MEQ CR capsule Take 2 capsules (20 mEq total) by mouth daily. 120 capsule 1  . UNABLE TO FIND Take 3 tablets by mouth 2 (two) times daily. OMEGA XL    . valsartan-hydrochlorothiazide (DIOVAN-HCT) 320-25 MG per tablet Take 1 tablet by mouth daily.    . ciprofloxacin (CIPRO) 500 MG tablet Take 1 tablet (500 mg total) by mouth 2 (two) times daily. (Patient not taking: Reported on 05/28/2014) 10 tablet 0  . fluconazole (DIFLUCAN) 150 MG tablet Take 1 tablet (150 mg total) by mouth as directed. (Patient not taking: Reported on 05/14/2014) 2 tablet 0  . HYDROcodone-acetaminophen (NORCO/VICODIN) 5-325 MG per tablet Take 1 tablet by mouth every 4 (four) hours as needed for moderate pain.     . Melatonin 5 MG TABS Take 1 tablet by mouth at bedtime as needed (sleep).    . nystatin-triamcinolone ointment (MYCOLOG)     . oxyCODONE (OXY IR/ROXICODONE) 5 MG immediate release tablet  Take 1-2 tablets (5-10 mg total) by mouth every 4 (four) hours as needed for moderate pain, severe pain or breakthrough pain. (Patient not taking: Reported on 05/14/2014) 40 tablet 0  . prochlorperazine (COMPAZINE) 10 MG tablet Take 1 tablet (10 mg total) by mouth every 6 (six) hours as needed (Nausea or vomiting). (Patient not taking: Reported on 06/04/2014) 70 tablet 2  . traZODone (DESYREL) 50 MG tablet Take 1 tablet (50 mg total) by mouth at bedtime. (Patient not taking: Reported on 05/28/2014) 30 tablet 2   No current facility-administered medications for this visit.    OBJECTIVE: Middle-aged [place] American woman who appears stated age 68 Vitals:   06/04/14 0919  BP: 124/73  Pulse: 95  Temp: 99.3 F (37.4 C)  Resp: 19     Body mass index is 39.48 kg/(m^2).    ECOG FS:1 - Symptomatic but completely ambulatory  Sclerae unicteric, pupils equal and reactive Oropharynx clear and moist-- no thrush No cervical or supraclavicular adenopathy Lungs no rales or rhonchi Heart regular rate and rhythm Abd soft, nontender, positive bowel sounds MSK no focal spinal tenderness, no upper extremity lymphedema Neuro: nonfocal, well oriented, appropriate affect Breasts: deferred  LAB RESULTS:  CMP     Component Value Date/Time   NA 139 05/28/2014 0916   NA 141 04/18/2014 0444   K 3.8 05/28/2014 0916   K 3.7 04/18/2014 0444   CL 106 04/18/2014 0444   CO2 27 05/28/2014 0916   CO2 24 04/18/2014 0444   GLUCOSE 151* 05/28/2014 0916   GLUCOSE 127* 04/18/2014 0444   BUN 11.0 05/28/2014 0916   BUN 6 04/18/2014 0444   CREATININE 0.9 05/28/2014 0916   CREATININE 0.66 04/18/2014 0444   CALCIUM 9.9 05/28/2014 0916   CALCIUM 8.7 04/18/2014 0444   PROT 6.6 05/28/2014 0916   PROT 5.8* 04/16/2014 0515   ALBUMIN 3.2* 05/28/2014 0916   ALBUMIN 2.6* 04/16/2014 0515   AST 21 05/28/2014 0916   AST 12 04/16/2014 0515   ALT 31 05/28/2014 0916   ALT 15 04/16/2014 0515   ALKPHOS 82 05/28/2014 0916     ALKPHOS 90 04/16/2014 0515   BILITOT 0.20 05/28/2014 0916   BILITOT 0.5 04/16/2014 0515   GFRNONAA >90 04/18/2014 0444   GFRAA >90 04/18/2014 0444    I No results found for: SPEP  Lab Results  Component Value Date   WBC 2.5* 06/04/2014   NEUTROABS 1.6 06/04/2014  HGB 10.4* 06/04/2014   HCT 31.7* 06/04/2014   MCV 86.4 06/04/2014   PLT 375 06/04/2014      Chemistry      Component Value Date/Time   NA 139 05/28/2014 0916   NA 141 04/18/2014 0444   K 3.8 05/28/2014 0916   K 3.7 04/18/2014 0444   CL 106 04/18/2014 0444   CO2 27 05/28/2014 0916   CO2 24 04/18/2014 0444   BUN 11.0 05/28/2014 0916   BUN 6 04/18/2014 0444   CREATININE 0.9 05/28/2014 0916   CREATININE 0.66 04/18/2014 0444      Component Value Date/Time   CALCIUM 9.9 05/28/2014 0916   CALCIUM 8.7 04/18/2014 0444   ALKPHOS 82 05/28/2014 0916   ALKPHOS 90 04/16/2014 0515   AST 21 05/28/2014 0916   AST 12 04/16/2014 0515   ALT 31 05/28/2014 0916   ALT 15 04/16/2014 0515   BILITOT 0.20 05/28/2014 0916   BILITOT 0.5 04/16/2014 0515       No results found for: LABCA2  No components found for: [id number]  No results for input(s): INR in the last 168 hours.  Urinalysis    Component Value Date/Time   COLORURINE YELLOW 04/15/2014 1814    STUDIES: Most recent echocardiogram on 04/03/14 showed a well preserved ejection fraction of 60-65%    ASSESSMENT: 68 y.o. [place] woman status post left breast biopsy of 2 separate lower outer quadrant masses 01/10/2014 for a multifocal cT2 cNX, stage II invasive ductal carcinoma, grade 3, with an MIB-1 of 90%, HER-2 negative, estrogen receptors 3% positive with weak staining, progesterone receptor negative, with an MIB-1 of 90%; this is clinically a "triple-negative" breast cancer and will be treated as such  (1) a suspicious left axillary lymph node biopsied 01/10/2014 was negative (concordant)  (2) left lumpectomy and sentinel lymph node sampling 02/12/2014  showed an mpT2 pN0, stage IIA invasive ductal carcinoma grade 3, repeat HER-2 pending  (3) adjuvant chemotherapy starting 03/12/2014 to consist of dose dense doxorubicin and cyclophosphamide x4, with Neulasta support, followed by weekly paclitaxel and carboplatin x12  (4) adjuvant radiation to follow chemotherapy  PLAN:  The labs were reviewed in detail and were entirely stable. However given the new onset of peripheral neuropathy symptoms we will hold treatment. When she returns next week we will reassess and likely reduce the dose of paclitaxel. If it persists, we will switch out the paclitaxel for a different drug.   Marli's sinus symptoms are likely viral in nature. She will continue to treat herself with OTC meds and stay well hydrated.   Kayzlee will return next week for the restart of cycle 4. She understands and agrees with this plan. She knows the goal of treatment in her case is cure. She has been encouraged to call with any issues that might arise before her next visit here.   Marcelino Duster, [place] [phone] 06/04/2014 9:47 AM

## 2014-06-11 ENCOUNTER — Other Ambulatory Visit: Payer: Self-pay | Admitting: Oncology

## 2014-06-11 ENCOUNTER — Encounter: Payer: Self-pay | Admitting: Oncology

## 2014-06-11 ENCOUNTER — Ambulatory Visit (HOSPITAL_BASED_OUTPATIENT_CLINIC_OR_DEPARTMENT_OTHER): Payer: MEDICARE

## 2014-06-11 ENCOUNTER — Ambulatory Visit (HOSPITAL_BASED_OUTPATIENT_CLINIC_OR_DEPARTMENT_OTHER): Payer: MEDICARE | Admitting: Oncology

## 2014-06-11 VITALS — BP 140/71 | HR 92 | Temp 99.0°F | Resp 19 | Ht 66.0 in | Wt 249.5 lb

## 2014-06-11 DIAGNOSIS — Z171 Estrogen receptor negative status [ER-]: Secondary | ICD-10-CM

## 2014-06-11 DIAGNOSIS — C50512 Malignant neoplasm of lower-outer quadrant of left female breast: Secondary | ICD-10-CM

## 2014-06-11 DIAGNOSIS — Z5111 Encounter for antineoplastic chemotherapy: Secondary | ICD-10-CM

## 2014-06-11 LAB — COMPREHENSIVE METABOLIC PANEL (CC13)
ALT: 32 U/L (ref 0–55)
ANION GAP: 10 meq/L (ref 3–11)
AST: 23 U/L (ref 5–34)
Albumin: 3.2 g/dL — ABNORMAL LOW (ref 3.5–5.0)
Alkaline Phosphatase: 66 U/L (ref 40–150)
BILIRUBIN TOTAL: 0.31 mg/dL (ref 0.20–1.20)
BUN: 13.2 mg/dL (ref 7.0–26.0)
CO2: 26 meq/L (ref 22–29)
Calcium: 9.7 mg/dL (ref 8.4–10.4)
Chloride: 103 mEq/L (ref 98–109)
Creatinine: 0.9 mg/dL (ref 0.6–1.1)
EGFR: 81 mL/min/{1.73_m2} — ABNORMAL LOW (ref >90)
Glucose: 165 mg/dl — ABNORMAL HIGH (ref 70–140)
POTASSIUM: 3.5 meq/L (ref 3.5–5.1)
SODIUM: 139 meq/L (ref 136–145)
TOTAL PROTEIN: 6.6 g/dL (ref 6.4–8.3)

## 2014-06-11 LAB — CBC WITH DIFFERENTIAL/PLATELET
BASO%: 2.6 % — ABNORMAL HIGH (ref 0.0–2.0)
BASOS ABS: 0.1 10*3/uL (ref 0.0–0.1)
EOS ABS: 0.1 10*3/uL (ref 0.0–0.5)
EOS%: 2.9 % (ref 0.0–7.0)
HCT: 32.4 % — ABNORMAL LOW (ref 34.8–46.6)
HGB: 10.7 g/dL — ABNORMAL LOW (ref 11.6–15.9)
LYMPH%: 27.3 % (ref 14.0–49.7)
MCH: 28.4 pg (ref 25.1–34.0)
MCHC: 33 g/dL (ref 31.5–36.0)
MCV: 85.9 fL (ref 79.5–101.0)
MONO#: 0.4 10*3/uL (ref 0.1–0.9)
MONO%: 13.8 % (ref 0.0–14.0)
NEUT#: 1.7 10*3/uL (ref 1.5–6.5)
NEUT%: 53.4 % (ref 38.4–76.8)
Platelets: 278 10*3/uL (ref 145–400)
RBC: 3.77 10*6/uL (ref 3.70–5.45)
RDW: 17.6 % — ABNORMAL HIGH (ref 11.2–14.5)
WBC: 3.1 10*3/uL — ABNORMAL LOW (ref 3.9–10.3)
lymph#: 0.9 10*3/uL (ref 0.9–3.3)

## 2014-06-11 MED ORDER — SODIUM CHLORIDE 0.9 % IV SOLN
Freq: Once | INTRAVENOUS | Status: AC
  Administered 2014-06-11: 11:00:00 via INTRAVENOUS

## 2014-06-11 MED ORDER — DEXAMETHASONE SODIUM PHOSPHATE 10 MG/ML IJ SOLN
10.0000 mg | Freq: Once | INTRAMUSCULAR | Status: AC
  Administered 2014-06-11: 10 mg via INTRAVENOUS

## 2014-06-11 MED ORDER — HEPARIN SOD (PORK) LOCK FLUSH 100 UNIT/ML IV SOLN
500.0000 [IU] | Freq: Once | INTRAVENOUS | Status: AC | PRN
  Administered 2014-06-11: 500 [IU]
  Filled 2014-06-11: qty 5

## 2014-06-11 MED ORDER — ONDANSETRON 8 MG/50ML IVPB (CHCC)
8.0000 mg | Freq: Once | INTRAVENOUS | Status: AC
  Administered 2014-06-11: 8 mg via INTRAVENOUS

## 2014-06-11 MED ORDER — ALTEPLASE 2 MG IJ SOLR
2.0000 mg | Freq: Once | INTRAMUSCULAR | Status: DC | PRN
  Filled 2014-06-11: qty 2

## 2014-06-11 MED ORDER — SODIUM CHLORIDE 0.9 % IV SOLN
800.0000 mg/m2 | Freq: Once | INTRAVENOUS | Status: AC
  Administered 2014-06-11: 1824 mg via INTRAVENOUS
  Filled 2014-06-11: qty 47.97

## 2014-06-11 MED ORDER — SODIUM CHLORIDE 0.9 % IJ SOLN
10.0000 mL | INTRAMUSCULAR | Status: DC | PRN
  Administered 2014-06-11: 10 mL
  Filled 2014-06-11: qty 10

## 2014-06-11 MED ORDER — DEXAMETHASONE SODIUM PHOSPHATE 10 MG/ML IJ SOLN
INTRAMUSCULAR | Status: AC
  Filled 2014-06-11: qty 1

## 2014-06-11 MED ORDER — SODIUM CHLORIDE 0.9 % IV SOLN
247.8000 mg | Freq: Once | INTRAVENOUS | Status: AC
  Administered 2014-06-11: 250 mg via INTRAVENOUS
  Filled 2014-06-11: qty 25

## 2014-06-11 MED ORDER — ONDANSETRON 8 MG/NS 50 ML IVPB
INTRAVENOUS | Status: AC
  Filled 2014-06-11: qty 8

## 2014-06-11 MED ORDER — SODIUM CHLORIDE 0.9 % IJ SOLN
3.0000 mL | INTRAMUSCULAR | Status: DC | PRN
  Filled 2014-06-11: qty 10

## 2014-06-11 MED ORDER — HEPARIN SOD (PORK) LOCK FLUSH 100 UNIT/ML IV SOLN
250.0000 [IU] | Freq: Once | INTRAVENOUS | Status: DC | PRN
  Filled 2014-06-11: qty 5

## 2014-06-11 NOTE — Progress Notes (Signed)
[place]  Telephone:([phone] Fax:(336) [phone]     ID: ALEIGHA GILANI DOB: 1946-01-28  MR#: [id number]  JYN#:[id number]  Patient Care Team: Maury Dus, MD as PCP - General (Family Medicine) Excell Seltzer, MD as Consulting Physician (General Surgery) Chauncey Cruel, MD as Consulting Physician (Oncology) Jodelle Gross, MD as Consulting Physician (Radiation Oncology) Maisie Fus, MD as Consulting Physician (Obstetrics and Gynecology)   CHIEF COMPLAINT: Newly diagnosed breast cancer CURRENT TREATMENT:  On adjuvant chemotherapy  BREAST CANCER HISTORY: From the original intake note:  [facility] showing a possible mass in her left breast. On 01/05/2014 left diagnostic mammography and ultrasonography at the breast Center showed 2 adjacent poorly defined masses deep in the lower outer portion of the left breast. On physical exam these were palpable measuring approximately 3 cm altogether, at the 4:00 position 16 cm from the left nipple. There were no palpable left axillary lymph nodes. Ultrasound confirmed to and adjacent irregularly marginated hypoechoic masses in the area in question, measuring 1.5 and 1.0 cm respectively. The total area of by ultrasonography measured 2.6 cm. In addition, to left axillary lymph nodes showed mild cortical thickening in one of them showed loss of the normal fatty hilum.  On 01/10/2014 the patient underwent separate biopsies of both the left breast masses in question as well as the more suspicious lymph node. The final pathology ([id number]) showed the lymph node to be benign. (This was interpreted as concordant). Both of the breast masses, however, were positive for an invasive ductal carcinoma, which was HER-2 not amplified, with the signals ratio of 0.98 and the number per cell being 2.45. Estrogen and progesterone receptor determination is pending but looks negative at this  point.  On 01/16/2014 the patient underwent bilateral breast MRI the right breast was unremarkable. In the left breast lower outer quadrant there were 2 oval masses measuring 1.5 and 2.2 cm. Taken together they was approximately 4 cm of disease. There were no findings of concern in the right breast. In the left axilla there was a single mildly prominent lymph node. There was no suspicious internal mammary adenopathy.  The patient's subsequent history is as detailed below  INTERVAL HISTORY: Alyson returns today for follow up of her breast cancer. Today is day 1, cycle 4 of paclitaxel and carboplatin.   Chemo was held last week due to neuropathy. Reports neuropathy has improved significnatly, but notes mild tingling in her fingertips from time to time. Corrie denies fevers, chills, nausea, vomiting, or changes in bowel or bladder habits. Her appetite has improved this week. She has no shortness of breath, chest pain, palpitations, or fatigue.  REVIEW OF SYSTEMS: A detailed review of systems is otherwise negative, except where noted above.    PAST MEDICAL HISTORY: Past Medical History  Diagnosis Date  . Allergy codeine    rapid heart beat, cold sweat  . Arthritis   . Breast cancer   . Hypertension     PAST SURGICAL HISTORY: Past Surgical History  Procedure Laterality Date  . Knee arthroscopy  2012    right  . Lumbar laminectomy  1991  . Colonoscopy    . Foot arthroplasty  1998    right  . Abdominal hysterectomy  1992  . Breast lumpectomy with needle localization and axillary sentinel lymph node bx Left 02/12/2014    Procedure: LEFT BREAST LUMPECTOMY WITH NEEDLE LOCALIZATION AND LEFT AXILLARY SENTINEL LYMPH NODE BX;  Surgeon:  [name], MD;  Location: [facility];  Service: General;  Laterality: Left;  . Portacath placement Right 02/12/2014    Procedure: INSERTION PORT-A-CATH;  Surgeon: [name], MD;  Location: [place];  Service:  General;  Laterality: Right;    FAMILY HISTORY Family History  Problem Relation Age of Onset  . Cancer Sister   . Cancer Brother   . Cancer Maternal Aunt   . Cancer Paternal Aunt    the patient's father died at the age of 33 from a stroke. The patient's mother died at the age of 21 from "multiple causes". The patient had 2 brothers and 2 sisters. The patient's sister was diagnosed with uterine cancer at the age of 75, and the patient's brother [name] had "bone cancer" and died in his 31s. He was treated at the [facility]. There is no history of breast or ovarian cancer in the family to the patient's knowledge.  GYNECOLOGIC HISTORY:  No LMP recorded. Patient has had a hysterectomy. Menarche age 54, the patient is GX P0. She underwent hysterectomy in 1992, with no salpingo-oophorectomy. She has been on estrogen replacement since. 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 Her husband [name] worked for [employer] in [place] as a Facilities manager. The patient's adopted son [name] works as a laborer in [place]. The patient's god-daughter [name] is an Optometrist. The patient has no grandchildren. The patient attends a local church of  [employer], and her husband [name] is pastor   ADVANCED DIRECTIVES: In place   HEALTH MAINTENANCE: History  Substance Use Topics  . Smoking status: Never Smoker   . Smokeless tobacco: Never Used  . Alcohol Use: No     Colonoscopy: October 2008  PAP: July 2015  Bone density: 01/01/2014  Lipid panel:  Allergies  Allergen Reactions  . Codeine     Heart fast    Current Outpatient Prescriptions  Medication Sig Dispense Refill  . Alum & Mag Hydroxide-Simeth (MAGIC MOUTHWASH) SOLN Take 5-10 mLs by mouth 4 (four) times daily.    [name] amLODipine (NORVASC) 5 MG tablet Take 5 mg by mouth daily.    . Cholecalciferol  (VITAMIN D3) 2000 UNITS TABS Take 1 tablet by mouth daily.    . ciprofloxacin (CIPRO) 500 MG tablet Take 1 tablet (500 mg total) by mouth 2 (two) times daily. 10 tablet 0  . dexamethasone (DECADRON) 4 MG tablet Take 4-8 mg by mouth 2 (two) times daily. Take one tablet the day after chemo and 2 tabs for 4 days after    . dextromethorphan-guaiFENesin (MUCINEX DM) 30-600 MG per 12 hr tablet Take 1 tablet by mouth 2 (two) times daily.    [name] docusate sodium (COLACE) 100 MG capsule Take 100 mg by mouth 2 (two) times daily.    . fluconazole (DIFLUCAN) 150 MG tablet Take 1 tablet (150 mg total) by mouth as directed. 2 tablet 0  . Ginkgo Biloba 120 MG CAPS Take 1 capsule by mouth daily.    [name] HYDROcodone-acetaminophen (NORCO/VICODIN) 5-325 MG per tablet Take 1 tablet by mouth every 4 (four) hours as needed for moderate pain.     [name] ibuprofen (ADVIL,MOTRIN) 200 MG tablet Take 400 mg by mouth daily.    [name] lidocaine-prilocaine (EMLA) cream     . LORazepam (ATIVAN) 0.5 MG tablet Take 1 tablet (0.5 mg total) by  mouth at bedtime as needed (Nausea or vomiting). 30 tablet 0  . Melatonin 5 MG TABS Take 1 tablet by mouth at bedtime as needed (sleep).    . nystatin-triamcinolone ointment (MYCOLOG)     . ondansetron (ZOFRAN) 8 MG tablet TAKE ONE TABLET BY MOUTH TWICE DAILY AS NEEDED START  ON  THE  THIRD  DAY  AFTER  CHEMOTHERAPY 30 tablet 2  . oxyCODONE (OXY IR/ROXICODONE) 5 MG immediate release tablet Take 1-2 tablets (5-10 mg total) by mouth every 4 (four) hours as needed for moderate pain, severe pain or breakthrough pain. 40 tablet 0  . potassium chloride (MICRO-K) 10 MEQ CR capsule Take 2 capsules (20 mEq total) by mouth daily. 120 capsule 1  . prochlorperazine (COMPAZINE) 10 MG tablet Take 1 tablet (10 mg total) by mouth every 6 (six) hours as needed (Nausea or vomiting). 70 tablet 2  . traZODone (DESYREL) 50 MG tablet Take 1 tablet (50 mg total) by mouth at bedtime. 30 tablet 2  . UNABLE TO FIND Take 3 tablets by  mouth 2 (two) times daily. OMEGA XL    . valsartan-hydrochlorothiazide (DIOVAN-HCT) 320-25 MG per tablet Take 1 tablet by mouth daily.     No current facility-administered medications for this visit.    OBJECTIVE: Middle-aged [place] American woman who appears stated age 34 Vitals:   06/11/14 0921  BP: 140/71  Pulse: 92  Temp: 99 F (37.2 C)  Resp: 19     Body mass index is 40.29 kg/(m^2).    ECOG FS:1 - Symptomatic but completely ambulatory  Sclerae unicteric, pupils equal and reactive Oropharynx clear and moist-- no thrush No cervical or supraclavicular adenopathy Lungs no rales or rhonchi Heart regular rate and rhythm Abd soft, nontender, positive bowel sounds MSK no focal spinal tenderness, no upper extremity lymphedema Neuro: nonfocal, well oriented, appropriate affect Breasts: deferred  LAB RESULTS:  CMP     Component Value Date/Time   NA 139 06/11/2014 0904   NA 141 04/18/2014 0444   K 3.5 06/11/2014 0904   K 3.7 04/18/2014 0444   CL 106 04/18/2014 0444   CO2 26 06/11/2014 0904   CO2 24 04/18/2014 0444   GLUCOSE 165* 06/11/2014 0904   GLUCOSE 127* 04/18/2014 0444   BUN 13.2 06/11/2014 0904   BUN 6 04/18/2014 0444   CREATININE 0.9 06/11/2014 0904   CREATININE 0.66 04/18/2014 0444   CALCIUM 9.7 06/11/2014 0904   CALCIUM 8.7 04/18/2014 0444   PROT 6.6 06/11/2014 0904   PROT 5.8* 04/16/2014 0515   ALBUMIN 3.2* 06/11/2014 0904   ALBUMIN 2.6* 04/16/2014 0515   AST 23 06/11/2014 0904   AST 12 04/16/2014 0515   ALT 32 06/11/2014 0904   ALT 15 04/16/2014 0515   ALKPHOS 66 06/11/2014 0904   ALKPHOS 90 04/16/2014 0515   BILITOT 0.31 06/11/2014 0904   BILITOT 0.5 04/16/2014 0515   GFRNONAA >90 04/18/2014 0444   GFRAA >90 04/18/2014 0444    I No results found for: SPEP  Lab Results  Component Value Date   WBC 3.1* 06/11/2014   NEUTROABS 1.7 06/11/2014   HGB 10.7* 06/11/2014   HCT 32.4* 06/11/2014   MCV 85.9 06/11/2014   PLT 278 06/11/2014       Chemistry      Component Value Date/Time   NA 139 06/11/2014 0904   NA 141 04/18/2014 0444   K 3.5 06/11/2014 0904   K 3.7 04/18/2014 0444   CL 106 04/18/2014 0444   CO2  26 06/11/2014 0904   CO2 24 04/18/2014 0444   BUN 13.2 06/11/2014 0904   BUN 6 04/18/2014 0444   CREATININE 0.9 06/11/2014 0904   CREATININE 0.66 04/18/2014 0444      Component Value Date/Time   CALCIUM 9.7 06/11/2014 0904   CALCIUM 8.7 04/18/2014 0444   ALKPHOS 66 06/11/2014 0904   ALKPHOS 90 04/16/2014 0515   AST 23 06/11/2014 0904   AST 12 04/16/2014 0515   ALT 32 06/11/2014 0904   ALT 15 04/16/2014 0515   BILITOT 0.31 06/11/2014 0904   BILITOT 0.5 04/16/2014 0515       No results found for: LABCA2  No components found for: CHYIF027  No results for input(s): INR in the last 168 hours.  Urinalysis    Component Value Date/Time   COLORURINE YELLOW 04/15/2014 1814    STUDIES: Most recent echocardiogram on 04/03/14 showed a well preserved ejection fraction of 60-65%    ASSESSMENT: 68 y.o. [place] woman status post left breast biopsy of 2 separate lower outer quadrant masses 01/10/2014 for a multifocal cT2 cNX, stage II invasive ductal carcinoma, grade 3, with an MIB-1 of 90%, HER-2 negative, estrogen receptors 3% positive with weak staining, progesterone receptor negative, with an MIB-1 of 90%; this is clinically a "triple-negative" breast cancer and will be treated as such  (1) a suspicious left axillary lymph node biopsied 01/10/2014 was negative (concordant)  (2) left lumpectomy and sentinel lymph node sampling 02/12/2014 showed an mpT2 pN0, stage IIA invasive ductal carcinoma grade 3, repeat HER-2 pending  (3) adjuvant chemotherapy starting 03/12/2014 to consist of dose dense doxorubicin and cyclophosphamide x4, with Neulasta support, followed by weekly paclitaxel and carboplatin x12. Taxol was D/C after 3 cycles due to neuropathy and the patient was switched to Carboplatin and Gemzar given on  day 1 & day 8 of a 21 days cycle for 3 planned cycles. Neulasta given on day 9 of each cycle.   (4) adjuvant radiation to follow chemotherapy  PLAN:  The labs were reviewed in detail and were entirely stable. Her neuropathy has improved and she would like to resume chemo. She is hesitant to resume chemo with Taxol given the history of neuropathy. Patient reviewed with Dr. Jana Hakim. Will switch Ericah to Carboplatin AUC 2 and Gemzar 800 mg/m2 on day 1 & day 8 of a 21 day cycle. Neulasta to be given on day 9 of each cycle. Plan is for 3 cycles. Side effects of Gemzar reviewed and patient handout given. Patient is in agreement to proceeding with this regimen.    Deairra will return next week for cycle 1 day 8 of Carboplatin and Gemzar. She understands and agrees with this plan. She knows the goal of treatment in her case is cure. She has been encouraged to call with any issues that might arise before her next visit here.   Mikey Bussing, [place] [phone] 06/11/2014 4:33 PM

## 2014-06-11 NOTE — Patient Instructions (Addendum)
[facility] Discharge Instructions for Patients Receiving Chemotherapy  Today you received the following chemotherapy agents Gemzar, Carboplatin    If you develop nausea and vomiting that is not controlled by your nausea medication, call the clinic.   BELOW ARE SYMPTOMS THAT SHOULD BE REPORTED IMMEDIATELY:  *FEVER GREATER THAN 100.5 F  *CHILLS WITH OR WITHOUT FEVER  NAUSEA AND VOMITING THAT IS NOT CONTROLLED WITH YOUR NAUSEA MEDICATION  *UNUSUAL SHORTNESS OF BREATH  *UNUSUAL BRUISING OR BLEEDING  TENDERNESS IN MOUTH AND THROAT WITH OR WITHOUT PRESENCE OF ULCERS  *URINARY PROBLEMS  *BOWEL PROBLEMS  UNUSUAL RASH Items with * indicate a potential emergency and should be followed up as soon as possible.  Feel free to call the clinic you have any questions or concerns. The clinic phone number is [phone].   Carboplatin injection What is this medicine? CARBOPLATIN (KAR boe pla tin) is a chemotherapy drug. It targets fast dividing cells, like cancer cells, and causes these cells to die. This medicine is used to treat ovarian cancer and many other cancers. This medicine may be used for other purposes; ask your health care provider or pharmacist if you have questions. COMMON BRAND NAME(S): Paraplatin What should I tell my health care provider before I take this medicine? They need to know if you have any of these conditions: -blood disorders -hearing problems -kidney disease -recent or ongoing radiation therapy -an unusual or allergic reaction to carboplatin, cisplatin, other chemotherapy, other medicines, foods, dyes, or preservatives -pregnant or trying to get pregnant -breast-feeding How should I use this medicine? This drug is usually given as an infusion into a vein. It is administered in a hospital or clinic by a specially trained health care professional. Talk to your pediatrician regarding the use of this medicine in children. Special care may be  needed. Overdosage: If you think you have taken too much of this medicine contact a poison control center or emergency room at once. NOTE: This medicine is only for you. Do not share this medicine with others. What if I miss a dose? It is important not to miss a dose. Call your doctor or health care professional if you are unable to keep an appointment. What may interact with this medicine? -medicines for seizures -medicines to increase blood counts like filgrastim, pegfilgrastim, sargramostim -some antibiotics like amikacin, gentamicin, neomycin, streptomycin, tobramycin -vaccines Talk to your doctor or health care professional before taking any of these medicines: -acetaminophen -aspirin -ibuprofen -ketoprofen -naproxen This list may not describe all possible interactions. Give your health care provider a list of all the medicines, herbs, non-prescription drugs, or dietary supplements you use. Also tell them if you smoke, drink alcohol, or use illegal drugs. Some items may interact with your medicine. What should I watch for while using this medicine? Your condition will be monitored carefully while you are receiving this medicine. You will need important blood work done while you are taking this medicine. This drug may make you feel generally unwell. This is not uncommon, as chemotherapy can affect healthy cells as well as cancer cells. Report any side effects. Continue your course of treatment even though you feel ill unless your doctor tells you to stop. In some cases, you may be given additional medicines to help with side effects. Follow all directions for their use. Call your doctor or health care professional for advice if you get a fever, chills or sore throat, or other symptoms of a cold or flu. Do not treat yourself.  This drug decreases your body's ability to fight infections. Try to avoid being around people who are sick. This medicine may increase your risk to bruise or bleed.  Call your doctor or health care professional if you notice any unusual bleeding. Be careful brushing and flossing your teeth or using a toothpick because you may get an infection or bleed more easily. If you have any dental work done, tell your dentist you are receiving this medicine. Avoid taking products that contain aspirin, acetaminophen, ibuprofen, naproxen, or ketoprofen unless instructed by your doctor. These medicines may hide a fever. Do not become pregnant while taking this medicine. Women should inform their doctor if they wish to become pregnant or think they might be pregnant. There is a potential for serious side effects to an unborn child. Talk to your health care professional or pharmacist for more information. Do not breast-feed an infant while taking this medicine. What side effects may I notice from receiving this medicine? Side effects that you should report to your doctor or health care professional as soon as possible: -allergic reactions like skin rash, itching or hives, swelling of the face, lips, or tongue -signs of infection - fever or chills, cough, sore throat, pain or difficulty passing urine -signs of decreased platelets or bleeding - bruising, pinpoint red spots on the skin, black, tarry stools, nosebleeds -signs of decreased red blood cells - unusually weak or tired, fainting spells, lightheadedness -breathing problems -changes in hearing -changes in vision -chest pain -high blood pressure -low blood counts - This drug may decrease the number of white blood cells, red blood cells and platelets. You may be at increased risk for infections and bleeding. -nausea and vomiting -pain, swelling, redness or irritation at the injection site -pain, tingling, numbness in the hands or feet -problems with balance, talking, walking -trouble passing urine or change in the amount of urine Side effects that usually do not require medical attention (report to your doctor or health  care professional if they continue or are bothersome): -hair loss -loss of appetite -metallic taste in the mouth or changes in taste This list may not describe all possible side effects. Call your doctor for medical advice about side effects. You may report side effects to FDA at 1-800-FDA-1088. Where should I keep my medicine? This drug is given in a hospital or clinic and will not be stored at home. NOTE: This sheet is a summary. It may not cover all possible information. If you have questions about this medicine, talk to your doctor, pharmacist, or health care provider.  2015, [employer]. (2007-09-13 14:38:05) Gemcitabine injection What is this medicine? GEMCITABINE (jem SIT a been) is a chemotherapy drug. This medicine is used to treat many types of cancer like breast cancer, lung cancer, pancreatic cancer, and ovarian cancer. This medicine may be used for other purposes; ask your health care provider or pharmacist if you have questions. COMMON BRAND NAME(S): Gemzar What should I tell my health care provider before I take this medicine? They need to know if you have any of these conditions: -blood disorders -infection -kidney disease -liver disease -recent or ongoing radiation therapy -an unusual or allergic reaction to gemcitabine, other chemotherapy, other medicines, foods, dyes, or preservatives -pregnant or trying to get pregnant -breast-feeding How should I use this medicine? This drug is given as an infusion into a vein. It is administered in a hospital or clinic by a specially trained health care professional. Talk to your pediatrician regarding the use of  this medicine in children. Special care may be needed. Overdosage: If you think you have taken too much of this medicine contact a poison control center or emergency room at once. NOTE: This medicine is only for you. Do not share this medicine with others. What if I miss a dose? It is important not to miss your  dose. Call your doctor or health care professional if you are unable to keep an appointment. What may interact with this medicine? -medicines to increase blood counts like filgrastim, pegfilgrastim, sargramostim -some other chemotherapy drugs like cisplatin -vaccines Talk to your doctor or health care professional before taking any of these medicines: -acetaminophen -aspirin -ibuprofen -ketoprofen -naproxen This list may not describe all possible interactions. Give your health care provider a list of all the medicines, herbs, non-prescription drugs, or dietary supplements you use. Also tell them if you smoke, drink alcohol, or use illegal drugs. Some items may interact with your medicine. What should I watch for while using this medicine? Visit your doctor for checks on your progress. This drug may make you feel generally unwell. This is not uncommon, as chemotherapy can affect healthy cells as well as cancer cells. Report any side effects. Continue your course of treatment even though you feel ill unless your doctor tells you to stop. In some cases, you may be given additional medicines to help with side effects. Follow all directions for their use. Call your doctor or health care professional for advice if you get a fever, chills or sore throat, or other symptoms of a cold or flu. Do not treat yourself. This drug decreases your body's ability to fight infections. Try to avoid being around people who are sick. This medicine may increase your risk to bruise or bleed. Call your doctor or health care professional if you notice any unusual bleeding. Be careful brushing and flossing your teeth or using a toothpick because you may get an infection or bleed more easily. If you have any dental work done, tell your dentist you are receiving this medicine. Avoid taking products that contain aspirin, acetaminophen, ibuprofen, naproxen, or ketoprofen unless instructed by your doctor. These medicines may hide  a fever. Women should inform their doctor if they wish to become pregnant or think they might be pregnant. There is a potential for serious side effects to an unborn child. Talk to your health care professional or pharmacist for more information. Do not breast-feed an infant while taking this medicine. What side effects may I notice from receiving this medicine? Side effects that you should report to your doctor or health care professional as soon as possible: -allergic reactions like skin rash, itching or hives, swelling of the face, lips, or tongue -low blood counts - this medicine may decrease the number of white blood cells, red blood cells and platelets. You may be at increased risk for infections and bleeding. -signs of infection - fever or chills, cough, sore throat, pain or difficulty passing urine -signs of decreased platelets or bleeding - bruising, pinpoint red spots on the skin, black, tarry stools, blood in the urine -signs of decreased red blood cells - unusually weak or tired, fainting spells, lightheadedness -breathing problems -chest pain -mouth sores -nausea and vomiting -pain, swelling, redness at site where injected -pain, tingling, numbness in the hands or feet -stomach pain -swelling of ankles, feet, hands -unusual bleeding Side effects that usually do not require medical attention (report to your doctor or health care professional if they continue or are bothersome): -constipation -  diarrhea -hair loss -loss of appetite -stomach upset This list may not describe all possible side effects. Call your doctor for medical advice about side effects. You may report side effects to FDA at 1-800-FDA-1088. Where should I keep my medicine? This drug is given in a hospital or clinic and will not be stored at home. NOTE: This sheet is a summary. It may not cover all possible information. If you have questions about this medicine, talk to your doctor, pharmacist, or health care  provider.  2015, [employer]. (2007-10-18 18:45:54)

## 2014-06-12 ENCOUNTER — Telehealth: Payer: Self-pay | Admitting: Oncology

## 2014-06-12 NOTE — Telephone Encounter (Signed)
Confirm appt d.t for 06/19/14.

## 2014-06-18 ENCOUNTER — Ambulatory Visit (HOSPITAL_BASED_OUTPATIENT_CLINIC_OR_DEPARTMENT_OTHER): Payer: MEDICARE

## 2014-06-18 ENCOUNTER — Other Ambulatory Visit (HOSPITAL_BASED_OUTPATIENT_CLINIC_OR_DEPARTMENT_OTHER): Payer: MEDICARE

## 2014-06-18 ENCOUNTER — Ambulatory Visit: Payer: MEDICARE | Admitting: Nurse Practitioner

## 2014-06-18 ENCOUNTER — Other Ambulatory Visit: Payer: MEDICARE

## 2014-06-18 ENCOUNTER — Ambulatory Visit (HOSPITAL_BASED_OUTPATIENT_CLINIC_OR_DEPARTMENT_OTHER): Payer: MEDICARE | Admitting: Oncology

## 2014-06-18 VITALS — BP 134/68 | HR 84 | Temp 98.3°F | Resp 20 | Ht 66.0 in | Wt 248.7 lb

## 2014-06-18 DIAGNOSIS — Z5111 Encounter for antineoplastic chemotherapy: Secondary | ICD-10-CM

## 2014-06-18 DIAGNOSIS — C50512 Malignant neoplasm of lower-outer quadrant of left female breast: Secondary | ICD-10-CM

## 2014-06-18 DIAGNOSIS — R52 Pain, unspecified: Secondary | ICD-10-CM

## 2014-06-18 DIAGNOSIS — Z171 Estrogen receptor negative status [ER-]: Secondary | ICD-10-CM

## 2014-06-18 LAB — CBC WITH DIFFERENTIAL/PLATELET
BASO%: 2 % (ref 0.0–2.0)
Basophils Absolute: 0.1 10*3/uL (ref 0.0–0.1)
EOS ABS: 0.1 10*3/uL (ref 0.0–0.5)
EOS%: 2.3 % (ref 0.0–7.0)
HEMATOCRIT: 32.5 % — AB (ref 34.8–46.6)
HGB: 10.5 g/dL — ABNORMAL LOW (ref 11.6–15.9)
LYMPH#: 0.8 10*3/uL — AB (ref 0.9–3.3)
LYMPH%: 30.6 % (ref 14.0–49.7)
MCH: 28.2 pg (ref 25.1–34.0)
MCHC: 32.3 g/dL (ref 31.5–36.0)
MCV: 87.2 fL (ref 79.5–101.0)
MONO#: 0.3 10*3/uL (ref 0.1–0.9)
MONO%: 10.6 % (ref 0.0–14.0)
NEUT#: 1.5 10*3/uL (ref 1.5–6.5)
NEUT%: 54.5 % (ref 38.4–76.8)
Platelets: 166 10*3/uL (ref 145–400)
RBC: 3.73 10*6/uL (ref 3.70–5.45)
RDW: 17.6 % — ABNORMAL HIGH (ref 11.2–14.5)
WBC: 2.8 10*3/uL — ABNORMAL LOW (ref 3.9–10.3)

## 2014-06-18 LAB — COMPREHENSIVE METABOLIC PANEL (CC13)
ALBUMIN: 3.4 g/dL — AB (ref 3.5–5.0)
ALK PHOS: 81 U/L (ref 40–150)
ALT: 49 U/L (ref 0–55)
AST: 34 U/L (ref 5–34)
Anion Gap: 9 mEq/L (ref 3–11)
BUN: 12.8 mg/dL (ref 7.0–26.0)
CO2: 28 meq/L (ref 22–29)
Calcium: 10.1 mg/dL (ref 8.4–10.4)
Chloride: 102 mEq/L (ref 98–109)
Creatinine: 0.9 mg/dL (ref 0.6–1.1)
EGFR: 80 mL/min/{1.73_m2} — ABNORMAL LOW (ref >90)
Glucose: 120 mg/dl (ref 70–140)
Potassium: 4 mEq/L (ref 3.5–5.1)
Sodium: 139 mEq/L (ref 136–145)
TOTAL PROTEIN: 6.9 g/dL (ref 6.4–8.3)
Total Bilirubin: 0.32 mg/dL (ref 0.20–1.20)

## 2014-06-18 MED ORDER — SODIUM CHLORIDE 0.9 % IV SOLN
800.0000 mg/m2 | Freq: Once | INTRAVENOUS | Status: AC
  Administered 2014-06-18: 1824 mg via INTRAVENOUS
  Filled 2014-06-18: qty 47.97

## 2014-06-18 MED ORDER — SODIUM CHLORIDE 0.9 % IV SOLN
Freq: Once | INTRAVENOUS | Status: AC
  Administered 2014-06-18: 11:00:00 via INTRAVENOUS

## 2014-06-18 MED ORDER — ONDANSETRON 8 MG/NS 50 ML IVPB
INTRAVENOUS | Status: AC
  Filled 2014-06-18: qty 8

## 2014-06-18 MED ORDER — SODIUM CHLORIDE 0.9 % IJ SOLN
10.0000 mL | INTRAMUSCULAR | Status: DC | PRN
  Administered 2014-06-18: 10 mL
  Filled 2014-06-18: qty 10

## 2014-06-18 MED ORDER — DEXAMETHASONE SODIUM PHOSPHATE 10 MG/ML IJ SOLN
INTRAMUSCULAR | Status: AC
  Filled 2014-06-18: qty 1

## 2014-06-18 MED ORDER — SODIUM CHLORIDE 0.9 % IV SOLN
247.8000 mg | Freq: Once | INTRAVENOUS | Status: AC
  Administered 2014-06-18: 250 mg via INTRAVENOUS
  Filled 2014-06-18: qty 25

## 2014-06-18 MED ORDER — ONDANSETRON 8 MG/50ML IVPB (CHCC)
8.0000 mg | Freq: Once | INTRAVENOUS | Status: AC
  Administered 2014-06-18: 8 mg via INTRAVENOUS

## 2014-06-18 MED ORDER — HEPARIN SOD (PORK) LOCK FLUSH 100 UNIT/ML IV SOLN
500.0000 [IU] | Freq: Once | INTRAVENOUS | Status: AC | PRN
  Administered 2014-06-18: 500 [IU]
  Filled 2014-06-18: qty 5

## 2014-06-18 MED ORDER — DEXAMETHASONE SODIUM PHOSPHATE 10 MG/ML IJ SOLN
10.0000 mg | Freq: Once | INTRAMUSCULAR | Status: AC
  Administered 2014-06-18: 10 mg via INTRAVENOUS

## 2014-06-18 NOTE — Progress Notes (Signed)
[facility]  [phone] Fax:(336) [phone]     ID: Monica [name] DOB: February 24, 1946  MR#: [id number]  [facility]#:[id number]  Patient Care Team: Maury Dus, MD as PCP - General (Family Medicine) Excell Seltzer, MD as Consulting Physician (General Surgery) Chauncey Cruel, MD as Consulting Physician (Oncology) Jodelle Gross, MD as Consulting Physician (Radiation Oncology) Maisie Fus, MD as Consulting Physician (Obstetrics and Gynecology)   CHIEF COMPLAINT: Newly diagnosed breast cancer  CURRENT TREATMENT:  On adjuvant chemotherapy  BREAST CANCER HISTORY: From the original intake note:  [name] showing a possible mass in her left breast. On 01/05/2014 left diagnostic mammography and ultrasonography at the breast Center showed 2 adjacent poorly defined masses deep in the lower outer portion of the left breast. On physical exam these were palpable measuring approximately 3 cm altogether, at the 4:00 position 16 cm from the left nipple. There were no palpable left axillary lymph nodes. Ultrasound confirmed to and adjacent irregularly marginated hypoechoic masses in the area in question, measuring 1.5 and 1.0 cm respectively. The total area of by ultrasonography measured 2.6 cm. In addition, to left axillary lymph nodes showed mild cortical thickening in one of them showed loss of the normal fatty hilum.  On 01/10/2014 the patient underwent separate biopsies of both the left breast masses in question as well as the more suspicious lymph node. The final pathology ([id number]) showed the lymph node to be benign. (This was interpreted as concordant). Both of the breast masses, however, were positive for an invasive ductal carcinoma, which was HER-2 not amplified, with the signals ratio of 0.98 and the number per cell being 2.45. Estrogen and progesterone receptor determination is pending but looks negative at  this point.  On 01/16/2014 the patient underwent bilateral breast MRI the right breast was unremarkable. In the left breast lower outer quadrant there were 2 oval masses measuring 1.5 and 2.2 cm. Taken together they was approximately 4 cm of disease. There were no findings of concern in the right breast. In the left axilla there was a single mildly prominent lymph node. There was no suspicious internal mammary adenopathy.  The patient's subsequent history is as detailed below  INTERVAL HISTORY: Monica [name] returns today for follow up of her breast cancer accompanied by her daughter. Today is day 8, cycle 1 of 3 cycles plannedof carboplatin and gemcitabine, both drugs given days 1 and 8 of each 21 day cycle, with Neulasta on day 9.    REVIEW OF SYSTEMS: Monica [name] tolerated her new chemotherapy well. The only change she noted was increased fatigue. She does have pain in both legs, left hip more than right. The pain does not radiate down to the knee, but does localize around the hips bilaterally. It is worse at night and when she wakes up in the morning. She is taking hydrocodone for this and it is constipating her. She has a stool softener and a mild laxative, but that has not quite salt the problem. Aside from these issues she continues to have some night sweats, a runny nose, some shortness of breath particularly when walking up a slope or up stairs, and other joint pains and aches which are not more intense or persistent than before. A detailed review of systems was otherwise stable   PAST MEDICAL HISTORY: Past Medical History  Diagnosis Date  . Allergy codeine    rapid heart beat, cold sweat  . Arthritis   .  Breast cancer   . Hypertension     PAST SURGICAL HISTORY: Past Surgical History  Procedure Laterality Date  . Knee arthroscopy  2012    right  . Lumbar laminectomy  1991  . Colonoscopy    . Foot arthroplasty  1998    right  . Abdominal hysterectomy  1992  . Breast lumpectomy with  needle localization and axillary sentinel lymph node bx Left 02/12/2014    Procedure: LEFT BREAST LUMPECTOMY WITH NEEDLE LOCALIZATION AND LEFT AXILLARY SENTINEL LYMPH NODE BX;  Surgeon: [name], MD;  Location: [facility];  Service: General;  Laterality: Left;  . Portacath placement Right 02/12/2014    Procedure: INSERTION PORT-A-CATH;  Surgeon: [name], MD;  Location: [facility];  Service: General;  Laterality: Right;    FAMILY HISTORY Family History  Problem Relation Age of Onset  . Cancer Sister   . Cancer Brother   . Cancer Maternal Aunt   . Cancer Paternal Aunt    the patient's father died at the age of 41 from a stroke. The patient's mother died at the age of 69 from "multiple causes". The patient had 2 brothers and 2 sisters. The patient's sister was diagnosed with uterine cancer at the age of 34, and the patient's brother [name] had "bone cancer" and died in his 22s. He was treated at the [facility]. There is no history of breast or ovarian cancer in the family to the patient's knowledge.  GYNECOLOGIC HISTORY:  No LMP recorded. Patient has had a hysterectomy. Menarche age 10, the patient is GX P0. She underwent hysterectomy in 1992, with no salpingo-oophorectomy. She has been on estrogen replacement since. 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 Her husband Monica [name] worked for [employer] in [place] as a Facilities manager. The patient's adopted son Monica [name] works as a laborer in [place]. The patient's god-daughter [name] is an Optometrist. The patient has no grandchildren. The patient attends a local church of  [place], and her husband Monica [name] is pastor   ADVANCED DIRECTIVES: In place   HEALTH MAINTENANCE: History  Substance Use Topics  . Smoking status: Never Smoker   . Smokeless tobacco:  Never Used  . Alcohol Use: No     Colonoscopy: October 2008  PAP: July 2015  Bone density: 01/01/2014  Lipid panel:  Allergies  Allergen Reactions  . Codeine     Heart fast    Current Outpatient Prescriptions  Medication Sig Dispense Refill  . Alum & Mag Hydroxide-Simeth (MAGIC MOUTHWASH) SOLN Take 5-10 mLs by mouth 4 (four) times daily.    [name] amLODipine (NORVASC) 5 MG tablet Take 5 mg by mouth daily.    . Cholecalciferol (VITAMIN D3) 2000 UNITS TABS Take 1 tablet by mouth daily.    . ciprofloxacin (CIPRO) 500 MG tablet Take 1 tablet (500 mg total) by mouth 2 (two) times daily. 10 tablet 0  . dexamethasone (DECADRON) 4 MG tablet Take 4-8 mg by mouth 2 (two) times daily. Take one tablet the day after chemo and 2 tabs for 4 days after    . dextromethorphan-guaiFENesin (MUCINEX DM) 30-600 MG per 12 hr tablet Take 1 tablet by mouth 2 (two) times daily.    [name] docusate sodium (COLACE) 100 MG capsule Take 100 mg by mouth 2 (two) times daily.    . fluconazole (DIFLUCAN) 150 MG  tablet Take 1 tablet (150 mg total) by mouth as directed. 2 tablet 0  . Ginkgo Biloba 120 MG CAPS Take 1 capsule by mouth daily.    [name] HYDROcodone-acetaminophen (NORCO/VICODIN) 5-325 MG per tablet Take 1 tablet by mouth every 4 (four) hours as needed for moderate pain.     [name] ibuprofen (ADVIL,MOTRIN) 200 MG tablet Take 400 mg by mouth daily.    [name] lidocaine-prilocaine (EMLA) cream     . LORazepam (ATIVAN) 0.5 MG tablet Take 1 tablet (0.5 mg total) by mouth at bedtime as needed (Nausea or vomiting). 30 tablet 0  . Melatonin 5 MG TABS Take 1 tablet by mouth at bedtime as needed (sleep).    . nystatin-triamcinolone ointment (MYCOLOG)     . ondansetron (ZOFRAN) 8 MG tablet TAKE ONE TABLET BY MOUTH TWICE DAILY AS NEEDED START  ON  THE  THIRD  DAY  AFTER  CHEMOTHERAPY 30 tablet 2  . oxyCODONE (OXY IR/ROXICODONE) 5 MG immediate release tablet Take 1-2 tablets (5-10 mg total) by mouth every 4 (four) hours as needed for moderate  pain, severe pain or breakthrough pain. 40 tablet 0  . potassium chloride (MICRO-K) 10 MEQ CR capsule Take 2 capsules (20 mEq total) by mouth daily. 120 capsule 1  . prochlorperazine (COMPAZINE) 10 MG tablet Take 1 tablet (10 mg total) by mouth every 6 (six) hours as needed (Nausea or vomiting). 70 tablet 2  . traZODone (DESYREL) 50 MG tablet Take 1 tablet (50 mg total) by mouth at bedtime. 30 tablet 2  . UNABLE TO FIND Take 3 tablets by mouth 2 (two) times daily. OMEGA XL    . valsartan-hydrochlorothiazide (DIOVAN-HCT) 320-25 MG per tablet Take 1 tablet by mouth daily.     No current facility-administered medications for this visit.    OBJECTIVE: Middle-aged [place] American woman in no acute distress  Filed Vitals:   06/18/14 0940  BP: 134/68  Pulse: 84  Temp: 98.3 F (36.8 C)  Resp: 20     Body mass index is 40.16 kg/(m^2).    ECOG FS:1 - Symptomatic but completely ambulatory  Sclerae unicteric, EOMs intact Oropharynx clear , no thrush or other lesions No cervical or supraclavicular adenopathy Lungs no rales or rhonchi Heart regular rate and rhythm Abd soft, obese,nontender, positive bowel sounds MSK no focal spinal tenderness, no upper extremity lymphedema Neuro: nonfocal, well oriented, appropriate affect Breasts: the right breast was unremarkable. The left breast is status postlumpectomy. There is no evidence of disease recurrence. The left axilla was benign.  LAB RESULTS:  CMP     Component Value Date/Time   NA 139 06/18/2014 0914   NA 141 04/18/2014 0444   K 4.0 06/18/2014 0914   K 3.7 04/18/2014 0444   CL 106 04/18/2014 0444   CO2 28 06/18/2014 0914   CO2 24 04/18/2014 0444   GLUCOSE 120 06/18/2014 0914   GLUCOSE 127* 04/18/2014 0444   BUN 12.8 06/18/2014 0914   BUN 6 04/18/2014 0444   CREATININE 0.9 06/18/2014 0914   CREATININE 0.66 04/18/2014 0444   CALCIUM 10.1 06/18/2014 0914   CALCIUM 8.7 04/18/2014 0444   PROT 6.9 06/18/2014 0914   PROT 5.8*  04/16/2014 0515   ALBUMIN 3.4* 06/18/2014 0914   ALBUMIN 2.6* 04/16/2014 0515   AST 34 06/18/2014 0914   AST 12 04/16/2014 0515   ALT 49 06/18/2014 0914   ALT 15 04/16/2014 0515   ALKPHOS 81 06/18/2014 0914   ALKPHOS 90 04/16/2014 0515   BILITOT  0.32 06/18/2014 0914   BILITOT 0.5 04/16/2014 0515   GFRNONAA >90 04/18/2014 0444   GFRAA >90 04/18/2014 0444    I No results found for: SPEP  Lab Results  Component Value Date   WBC 2.8* 06/18/2014   NEUTROABS 1.5 06/18/2014   HGB 10.5* 06/18/2014   HCT 32.5* 06/18/2014   MCV 87.2 06/18/2014   PLT 166 06/18/2014      Chemistry      Component Value Date/Time   NA 139 06/18/2014 0914   NA 141 04/18/2014 0444   K 4.0 06/18/2014 0914   K 3.7 04/18/2014 0444   CL 106 04/18/2014 0444   CO2 28 06/18/2014 0914   CO2 24 04/18/2014 0444   BUN 12.8 06/18/2014 0914   BUN 6 04/18/2014 0444   CREATININE 0.9 06/18/2014 0914   CREATININE 0.66 04/18/2014 0444      Component Value Date/Time   CALCIUM 10.1 06/18/2014 0914   CALCIUM 8.7 04/18/2014 0444   ALKPHOS 81 06/18/2014 0914   ALKPHOS 90 04/16/2014 0515   AST 34 06/18/2014 0914   AST 12 04/16/2014 0515   ALT 49 06/18/2014 0914   ALT 15 04/16/2014 0515   BILITOT 0.32 06/18/2014 0914   BILITOT 0.5 04/16/2014 0515       No results found for: LABCA2  No components found for: LABCA125  No results for input(s): INR in the last 168 hours.  Urinalysis    Component Value Date/Time   COLORURINE YELLOW 04/15/2014 1814    STUDIES: Most recent echocardiogram on 04/03/14 showed a well preserved ejection fraction of 60-65%    ASSESSMENT: 68 y.o. [place] woman status post left breast biopsy of 2 separate lower outer quadrant masses 01/10/2014 for a multifocal cT2 cNX, stage II invasive ductal carcinoma, grade 3, with an MIB-1 of 90%, HER-2 negative, estrogen receptors 3% positive with weak staining, progesterone receptor negative, with an MIB-1 of 90%; this is clinically a  "triple-negative" breast cancer and will be treated as such  (1) a suspicious left axillary lymph node biopsied 01/10/2014 was negative (concordant)  (2) left lumpectomy and sentinel lymph node sampling 02/12/2014 showed an mpT2 pN0, stage IIA invasive ductal carcinoma grade 3, repeat HER-2 again negative  (3) adjuvant chemotherapy consisted of  (a) dose dense doxorubicin and cyclophosphamide x4 completed 05/02/2014  (b) weekly paclitaxel and carboplatin with 3 of 12 doses given, then stopped because of neuropathy  (c) Carboplatin and Gemzar started 06/11/2014, given on day 1 & day 8 of a 21 days cycle for 3 planned cycles. Neulasta given on day 9 of each cycle.   (4) adjuvant radiation to follow chemotherapy  PLAN:  Shanaya tolerated day 1 of her first cycle of carboplatin and Gemzar well. She will complete her first cycle today with the day 8 treatment.  For some reason she had thought that this was her last chemotherapy. She understands that she has 4 more doses, 2 more cycles to go, which is going to take her into February before she completes her chemo. After that she will see Dr. Lisbeth Renshaw to consider radiation.   We discusseds the constipation she has from hydrocodone and she is already on stool softeners and a mild laxative. I suggested she add Miralax. She will let us know next week whether that is working for her or not.  If the left hip pain continues I will set her up for left hip films. Most likely however this is going to be related to arthritis and not  to her breast cancer.  She has a good understanding of the overall plan. She agrees with it. She knows the goal of treatment in her case is cure. She will call with any problems that may develop before her next visit here.  Chauncey Cruel, [place] [phone] 06/18/2014 10:07 AM

## 2014-06-18 NOTE — Patient Instructions (Signed)
[facility] Discharge Instructions for Patients Receiving Chemotherapy  Today you received the following chemotherapy agents;  Gemzar and Carboplatin.   To help prevent nausea and vomiting after your treatment, we encourage you to take your nausea medication as directed.    If you develop nausea and vomiting that is not controlled by your nausea medication, call the clinic.   BELOW ARE SYMPTOMS THAT SHOULD BE REPORTED IMMEDIATELY:  *FEVER GREATER THAN 100.5 F  *CHILLS WITH OR WITHOUT FEVER  NAUSEA AND VOMITING THAT IS NOT CONTROLLED WITH YOUR NAUSEA MEDICATION  *UNUSUAL SHORTNESS OF BREATH  *UNUSUAL BRUISING OR BLEEDING  TENDERNESS IN MOUTH AND THROAT WITH OR WITHOUT PRESENCE OF ULCERS  *URINARY PROBLEMS  *BOWEL PROBLEMS  UNUSUAL RASH Items with * indicate a potential emergency and should be followed up as soon as possible.  Feel free to call the clinic you have any questions or concerns. The clinic phone number is [phone].    

## 2014-06-19 ENCOUNTER — Other Ambulatory Visit: Payer: Self-pay

## 2014-06-19 ENCOUNTER — Ambulatory Visit: Payer: MEDICARE

## 2014-06-19 ENCOUNTER — Ambulatory Visit (HOSPITAL_BASED_OUTPATIENT_CLINIC_OR_DEPARTMENT_OTHER): Payer: MEDICARE

## 2014-06-19 ENCOUNTER — Other Ambulatory Visit: Payer: Self-pay | Admitting: Oncology

## 2014-06-19 DIAGNOSIS — G620 Drug-induced polyneuropathy: Secondary | ICD-10-CM

## 2014-06-19 DIAGNOSIS — C50512 Malignant neoplasm of lower-outer quadrant of left female breast: Secondary | ICD-10-CM

## 2014-06-19 DIAGNOSIS — Z5189 Encounter for other specified aftercare: Secondary | ICD-10-CM

## 2014-06-19 DIAGNOSIS — T451X5A Adverse effect of antineoplastic and immunosuppressive drugs, initial encounter: Secondary | ICD-10-CM

## 2014-06-19 MED ORDER — PEGFILGRASTIM INJECTION 6 MG/0.6ML
6.0000 mg | Freq: Once | SUBCUTANEOUS | Status: AC
  Administered 2014-06-19: 6 mg via SUBCUTANEOUS
  Filled 2014-06-19: qty 0.6

## 2014-06-19 NOTE — Addendum Note (Signed)
Addended by: Laureen Abrahams on: 06/19/2014 12:23 PM   Modules accepted: Medications

## 2014-06-25 ENCOUNTER — Ambulatory Visit (HOSPITAL_BASED_OUTPATIENT_CLINIC_OR_DEPARTMENT_OTHER): Payer: Medicare Other | Admitting: Nurse Practitioner

## 2014-06-25 ENCOUNTER — Ambulatory Visit: Payer: MEDICARE

## 2014-06-25 ENCOUNTER — Telehealth: Payer: Self-pay | Admitting: Nurse Practitioner

## 2014-06-25 ENCOUNTER — Other Ambulatory Visit (HOSPITAL_BASED_OUTPATIENT_CLINIC_OR_DEPARTMENT_OTHER): Payer: Medicare Other

## 2014-06-25 ENCOUNTER — Encounter: Payer: Self-pay | Admitting: Nurse Practitioner

## 2014-06-25 VITALS — BP 134/68 | HR 92 | Temp 98.2°F | Resp 18 | Ht 66.0 in | Wt 250.3 lb

## 2014-06-25 DIAGNOSIS — D696 Thrombocytopenia, unspecified: Secondary | ICD-10-CM

## 2014-06-25 DIAGNOSIS — C50512 Malignant neoplasm of lower-outer quadrant of left female breast: Secondary | ICD-10-CM

## 2014-06-25 DIAGNOSIS — D6481 Anemia due to antineoplastic chemotherapy: Secondary | ICD-10-CM

## 2014-06-25 DIAGNOSIS — T451X5A Adverse effect of antineoplastic and immunosuppressive drugs, initial encounter: Secondary | ICD-10-CM

## 2014-06-25 LAB — CBC WITH DIFFERENTIAL/PLATELET
BASO%: 0.4 % (ref 0.0–2.0)
Basophils Absolute: 0 10*3/uL (ref 0.0–0.1)
EOS ABS: 0.1 10*3/uL (ref 0.0–0.5)
EOS%: 1.6 % (ref 0.0–7.0)
HEMATOCRIT: 28.4 % — AB (ref 34.8–46.6)
HGB: 9.4 g/dL — ABNORMAL LOW (ref 11.6–15.9)
LYMPH%: 17.8 % (ref 14.0–49.7)
MCH: 28.7 pg (ref 25.1–34.0)
MCHC: 33.1 g/dL (ref 31.5–36.0)
MCV: 86.6 fL (ref 79.5–101.0)
MONO#: 0.3 10*3/uL (ref 0.1–0.9)
MONO%: 4.5 % (ref 0.0–14.0)
NEUT#: 4.2 10*3/uL (ref 1.5–6.5)
NEUT%: 75.7 % (ref 38.4–76.8)
NRBC: 0 % (ref 0–0)
Platelets: 35 10*3/uL — ABNORMAL LOW (ref 145–400)
RBC: 3.28 10*6/uL — ABNORMAL LOW (ref 3.70–5.45)
RDW: 17.1 % — ABNORMAL HIGH (ref 11.2–14.5)
WBC: 5.6 10*3/uL (ref 3.9–10.3)
lymph#: 1 10*3/uL (ref 0.9–3.3)

## 2014-06-25 LAB — COMPREHENSIVE METABOLIC PANEL (CC13)
ALBUMIN: 3.4 g/dL — AB (ref 3.5–5.0)
ALT: 48 U/L (ref 0–55)
AST: 28 U/L (ref 5–34)
Alkaline Phosphatase: 100 U/L (ref 40–150)
Anion Gap: 9 mEq/L (ref 3–11)
BUN: 13.7 mg/dL (ref 7.0–26.0)
CHLORIDE: 103 meq/L (ref 98–109)
CO2: 28 meq/L (ref 22–29)
Calcium: 9.7 mg/dL (ref 8.4–10.4)
Creatinine: 0.8 mg/dL (ref 0.6–1.1)
EGFR: 86 mL/min/{1.73_m2} — AB (ref >90)
Glucose: 150 mg/dl — ABNORMAL HIGH (ref 70–140)
POTASSIUM: 3.9 meq/L (ref 3.5–5.1)
Sodium: 140 mEq/L (ref 136–145)
Total Bilirubin: 0.38 mg/dL (ref 0.20–1.20)
Total Protein: 6.6 g/dL (ref 6.4–8.3)

## 2014-06-25 NOTE — Progress Notes (Signed)
[place]  [phone] [phone]) [phone]     ID: Monica [name] DOB: 12-Feb-1946  MR#: [id number]  KMQ#:[id number]  Patient Care Team: Maury Dus, MD as PCP - General (Family Medicine) Excell Seltzer, MD as Consulting Physician (General Surgery) Chauncey Cruel, MD as Consulting Physician (Oncology) Jodelle Gross, MD as Consulting Physician (Radiation Oncology) Maisie Fus, MD as Consulting Physician (Obstetrics and Gynecology)   CHIEF COMPLAINT: Newly diagnosed breast cancer  CURRENT TREATMENT:  On adjuvant chemotherapy  BREAST CANCER HISTORY: From the original intake note:  [facility]ica [name] showing a possible mass in her left breast. On 01/05/2014 left diagnostic mammography and ultrasonography at the breast Center showed 2 adjacent poorly defined masses deep in the lower outer portion of the left breast. On physical exam these were palpable measuring approximately 3 cm altogether, at the 4:00 position 16 cm from the left nipple. There were no palpable left axillary lymph nodes. Ultrasound confirmed to and adjacent irregularly marginated hypoechoic masses in the area in question, measuring 1.5 and 1.0 cm respectively. The total area of by ultrasonography measured 2.6 cm. In addition, to left axillary lymph nodes showed mild cortical thickening in one of them showed loss of the normal fatty hilum.  On 01/10/2014 the patient underwent separate biopsies of both the left breast masses in question as well as the more suspicious lymph node. The final pathology ([id number]) showed the lymph node to be benign. (This was interpreted as concordant). Both of the breast masses, however, were positive for an invasive ductal carcinoma, which was HER-2 not amplified, with the signals ratio of 0.98 and the number per cell being 2.45. Estrogen and progesterone receptor determination is pending but looks negative at  this point.  On 01/16/2014 the patient underwent bilateral breast MRI the right breast was unremarkable. In the left breast lower outer quadrant there were 2 oval masses measuring 1.5 and 2.2 cm. Taken together they was approximately 4 cm of disease. There were no findings of concern in the right breast. In the left axilla there was a single mildly prominent lymph node. There was no suspicious internal mammary adenopathy.  The patient's subsequent history is as detailed below  INTERVAL HISTORY: Monica [name] returns today for follow up of her breast cancer accompanied by her goddaughter. Today is day 15, cycle 1 of 3 cycles plannedof carboplatin and gemcitabine, both drugs given days 1 and 8 of each 21 day cycle, with Neulasta on day 9.   REVIEW OF SYSTEMS: Monica [name] denies fevers, chills, nausea, or vomiting. She has constipation that she though was due to hydrocodone, but after switching to ibuprofen has continued. She is taking a stool softener BID. Her appetite is low secondary to taste changes but she is making herself eat. She uses Boost as a supplement for when she does not eat much. She has no mouth sore or rashes and her peripheral neuropathy symptoms have resolved. Her fatigue continues and is her main complaint. She denies shortness of breath, chest pain, cough, or palpitations. A detailed review of systems is otherwise negative.    PAST MEDICAL HISTORY: Past Medical History  Diagnosis Date  . Allergy codeine    rapid heart beat, cold sweat  . Arthritis   . Breast cancer   . Hypertension     PAST SURGICAL HISTORY: Past Surgical History  Procedure Laterality Date  . Knee arthroscopy  2012    right  . Lumbar laminectomy  1991  . Colonoscopy    . Foot arthroplasty  1998    right  . Abdominal hysterectomy  1992  . Breast lumpectomy with needle localization and axillary sentinel lymph node bx Left 02/12/2014    Procedure: LEFT BREAST LUMPECTOMY WITH NEEDLE LOCALIZATION AND LEFT AXILLARY  SENTINEL LYMPH NODE BX;  Surgeon: [name], MD;  Location: [facility];  Service: General;  Laterality: Left;  . Portacath placement Right 02/12/2014    Procedure: INSERTION PORT-A-CATH;  Surgeon: [name], MD;  Location: [facility];  Service: General;  Laterality: Right;    FAMILY HISTORY Family History  Problem Relation Age of Onset  . Cancer Sister   . Cancer Brother   . Cancer Maternal Aunt   . Cancer Paternal Aunt    the patient's father died at the age of 37 from a stroke. The patient's mother died at the age of 28 from "multiple causes". The patient had 2 brothers and 2 sisters. The patient's sister was diagnosed with uterine cancer at the age of 16, and the patient's brother [name] had "bone cancer" and died in his 80s. He was treated at the [facility]. There is no history of breast or ovarian cancer in the family to the patient's knowledge.  GYNECOLOGIC HISTORY:  No LMP recorded. Patient has had a hysterectomy. Menarche age 7, the patient is GX P0. She underwent hysterectomy in 1992, with no salpingo-oophorectomy. She has been on estrogen replacement since. 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 Her husband [name] worked for [employer] in [place] as a Facilities manager. The patient's adopted son [name] works as a laborer in [place]. The patient's god-daughter [name] is an Optometrist. The patient has no grandchildren. The patient attends a local church of  [place], and her husband [name] is pastor   ADVANCED DIRECTIVES: In place   HEALTH MAINTENANCE: History  Substance Use Topics  . Smoking status: Never Smoker   . Smokeless tobacco: Never Used  . Alcohol Use: No     Colonoscopy: October 2008  PAP: July 2015  Bone density: 01/01/2014  Lipid panel:  Allergies  Allergen  Reactions  . Codeine     Heart fast    Current Outpatient Prescriptions  Medication Sig Dispense Refill  . Alum & Mag Hydroxide-Simeth (MAGIC MOUTHWASH) SOLN Take 5-10 mLs by mouth 4 (four) times daily.    [name] amLODipine (NORVASC) 5 MG tablet Take 5 mg by mouth daily.    . Cholecalciferol (VITAMIN D3) 2000 UNITS TABS Take 1 tablet by mouth daily.    . ciprofloxacin (CIPRO) 500 MG tablet Take 1 tablet (500 mg total) by mouth 2 (two) times daily. 10 tablet 0  . dexamethasone (DECADRON) 4 MG tablet Take 4-8 mg by mouth 2 (two) times daily. Take one tablet the day after chemo and 2 tabs for 4 days after    . dextromethorphan-guaiFENesin (MUCINEX DM) 30-600 MG per 12 hr tablet Take 1 tablet by mouth 2 (two) times daily.    [name] docusate sodium (COLACE) 100 MG capsule Take 100 mg by mouth 2 (two) times daily.    . fluconazole (DIFLUCAN) 150 MG tablet Take 1 tablet (150 mg total) by mouth as directed. 2 tablet 0  . Ginkgo Biloba 120 MG CAPS Take 1 capsule by mouth daily.    [name] HYDROcodone-acetaminophen (NORCO/VICODIN) 5-325 MG  per tablet Take 1 tablet by mouth every 4 (four) hours as needed for moderate pain.     [name] ibuprofen (ADVIL,MOTRIN) 200 MG tablet Take 400 mg by mouth daily.    [name] lidocaine-prilocaine (EMLA) cream     . LORazepam (ATIVAN) 0.5 MG tablet Take 1 tablet (0.5 mg total) by mouth at bedtime as needed (Nausea or vomiting). 30 tablet 0  . Melatonin 5 MG TABS Take 1 tablet by mouth at bedtime as needed (sleep).    . nystatin-triamcinolone ointment (MYCOLOG)     . ondansetron (ZOFRAN) 8 MG tablet TAKE ONE TABLET BY MOUTH TWICE DAILY AS NEEDED START  ON  THE  THIRD  DAY  AFTER  CHEMOTHERAPY 30 tablet 2  . oxyCODONE (OXY IR/ROXICODONE) 5 MG immediate release tablet Take 1-2 tablets (5-10 mg total) by mouth every 4 (four) hours as needed for moderate pain, severe pain or breakthrough pain. 40 tablet 0  . potassium chloride (MICRO-K) 10 MEQ CR capsule Take 2 capsules (20 mEq total) by mouth daily.  120 capsule 1  . prochlorperazine (COMPAZINE) 10 MG tablet Take 1 tablet (10 mg total) by mouth every 6 (six) hours as needed (Nausea or vomiting). 70 tablet 2  . traZODone (DESYREL) 50 MG tablet Take 1 tablet (50 mg total) by mouth at bedtime. 30 tablet 2  . UNABLE TO FIND Take 3 tablets by mouth 2 (two) times daily. OMEGA XL    . valsartan-hydrochlorothiazide (DIOVAN-HCT) 320-25 MG per tablet Take 1 tablet by mouth daily.     No current facility-administered medications for this visit.    OBJECTIVE: Middle-aged [place] American woman in no acute distress  Filed Vitals:   06/25/14 0930  BP: 134/68  Pulse: 92  Temp: 98.2 F (36.8 C)  Resp: 18     Body mass index is 40.42 kg/(m^2).    ECOG FS:1 - Symptomatic but completely ambulatory  Skin: warm, dry  HEENT: sclerae anicteric, conjunctivae pink, oropharynx clear. No thrush or mucositis.  Lymph Nodes: No cervical or supraclavicular lymphadenopathy  Lungs: clear to auscultation bilaterally, no rales, wheezes, or rhonci  Heart: regular rate and rhythm  Abdomen: round, soft, non tender, positive bowel sounds  Musculoskeletal: No focal spinal tenderness, no peripheral edema  Neuro: non focal, well oriented, positive affect  Breasts: deferred  LAB RESULTS:  CMP     Component Value Date/Time   NA 139 06/18/2014 0914   NA 141 04/18/2014 0444   K 4.0 06/18/2014 0914   K 3.7 04/18/2014 0444   CL 106 04/18/2014 0444   CO2 28 06/18/2014 0914   CO2 24 04/18/2014 0444   GLUCOSE 120 06/18/2014 0914   GLUCOSE 127* 04/18/2014 0444   BUN 12.8 06/18/2014 0914   BUN 6 04/18/2014 0444   CREATININE 0.9 06/18/2014 0914   CREATININE 0.66 04/18/2014 0444   CALCIUM 10.1 06/18/2014 0914   CALCIUM 8.7 04/18/2014 0444   PROT 6.9 06/18/2014 0914   PROT 5.8* 04/16/2014 0515   ALBUMIN 3.4* 06/18/2014 0914   ALBUMIN 2.6* 04/16/2014 0515   AST 34 06/18/2014 0914   AST 12 04/16/2014 0515   ALT 49 06/18/2014 0914   ALT 15 04/16/2014 0515    ALKPHOS 81 06/18/2014 0914   ALKPHOS 90 04/16/2014 0515   BILITOT 0.32 06/18/2014 0914   BILITOT 0.5 04/16/2014 0515   GFRNONAA >90 04/18/2014 0444   GFRAA >90 04/18/2014 0444    I No results found for: SPEP  Lab Results  Component Value Date  WBC 5.6 06/25/2014   NEUTROABS 4.2 06/25/2014   HGB 9.4* 06/25/2014   HCT 28.4* 06/25/2014   MCV 86.6 06/25/2014   PLT 35* 06/25/2014      Chemistry      Component Value Date/Time   NA 139 06/18/2014 0914   NA 141 04/18/2014 0444   K 4.0 06/18/2014 0914   K 3.7 04/18/2014 0444   CL 106 04/18/2014 0444   CO2 28 06/18/2014 0914   CO2 24 04/18/2014 0444   BUN 12.8 06/18/2014 0914   BUN 6 04/18/2014 0444   CREATININE 0.9 06/18/2014 0914   CREATININE 0.66 04/18/2014 0444      Component Value Date/Time   CALCIUM 10.1 06/18/2014 0914   CALCIUM 8.7 04/18/2014 0444   ALKPHOS 81 06/18/2014 0914   ALKPHOS 90 04/16/2014 0515   AST 34 06/18/2014 0914   AST 12 04/16/2014 0515   ALT 49 06/18/2014 0914   ALT 15 04/16/2014 0515   BILITOT 0.32 06/18/2014 0914   BILITOT 0.5 04/16/2014 0515       No results found for: LABCA2  No components found for: LABCA125  No results for input(s): INR in the last 168 hours.  Urinalysis    Component Value Date/Time   COLORURINE YELLOW 04/15/2014 1814    STUDIES: Most recent echocardiogram on 04/03/14 showed a well preserved ejection fraction of 60-65%    ASSESSMENT: 69 y.o. Monica [name] woman status post left breast biopsy of 2 separate lower outer quadrant masses 01/10/2014 for a multifocal cT2 cNX, stage II invasive ductal carcinoma, grade 3, with an MIB-1 of 90%, HER-2 negative, estrogen receptors 3% positive with weak staining, progesterone receptor negative, with an MIB-1 of 90%; this is clinically a "triple-negative" breast cancer and will be treated as such  (1) a suspicious left axillary lymph node biopsied 01/10/2014 was negative (concordant)  (2) left lumpectomy and sentinel lymph  node sampling 02/12/2014 showed an mpT2 pN0, stage IIA invasive ductal carcinoma grade 3, repeat HER-2 again negative  (3) adjuvant chemotherapy consisted of  (a) dose dense doxorubicin and cyclophosphamide x4 completed 05/02/2014  (b) weekly paclitaxel and carboplatin with 3 of 12 doses given, then stopped because of neuropathy  (c) Carboplatin and Gemzar started 06/11/2014, given on day 1 & day 8 of a 21 days cycle for 3 planned cycles. Neulasta given on day 9 of each cycle.   (4) adjuvant radiation to follow chemotherapy  PLAN:  Joelie is tired today. The labs were reviewed in detail and she has treatment related anemia with a hgb of 9.4 as well as a platelet count of 35, likely secondary to the carboplatin. This is her week off from chemo, so we will monitor these labs again next week accordingly. A dose adjustment may need to occur.   Verner will return next week for labs, an office visit, and the start of cycle 2 of carboplatin and gemzar. She understands and agrees with this plan. She knows the goal of treatment in her case is cure. She has been encouraged to call with any issues that might arise before her next visit here.   Marcelino Duster, [place] [phone] 06/25/2014 9:59 AM

## 2014-06-25 NOTE — Patient Instructions (Signed)
Gemcitabine injection What is this medicine? GEMCITABINE (jem SIT a been) is a chemotherapy drug. This medicine is used to treat many types of cancer like breast cancer, lung cancer, pancreatic cancer, and ovarian cancer. This medicine may be used for other purposes; ask your health care provider or pharmacist if you have questions. COMMON BRAND NAME(S): Gemzar What should I tell my health care provider before I take this medicine? They need to know if you have any of these conditions: -blood disorders -infection -kidney disease -liver disease -recent or ongoing radiation therapy -an unusual or allergic reaction to gemcitabine, other chemotherapy, other medicines, foods, dyes, or preservatives -pregnant or trying to get pregnant -breast-feeding How should I use this medicine? This drug is given as an infusion into a vein. It is administered in a hospital or clinic by a specially trained health care professional. Talk to your pediatrician regarding the use of this medicine in children. Special care may be needed. Overdosage: If you think you have taken too much of this medicine contact a poison control center or emergency room at once. NOTE: This medicine is only for you. Do not share this medicine with others. What if I miss a dose? It is important not to miss your dose. Call your doctor or health care professional if you are unable to keep an appointment. What may interact with this medicine? -medicines to increase blood counts like filgrastim, pegfilgrastim, sargramostim -some other chemotherapy drugs like cisplatin -vaccines Talk to your doctor or health care professional before taking any of these medicines: -acetaminophen -aspirin -ibuprofen -ketoprofen -naproxen This list may not describe all possible interactions. Give your health care provider a list of all the medicines, herbs, non-prescription drugs, or dietary supplements you use. Also tell them if you smoke, drink alcohol,  or use illegal drugs. Some items may interact with your medicine. What should I watch for while using this medicine? Visit your doctor for checks on your progress. This drug may make you feel generally unwell. This is not uncommon, as chemotherapy can affect healthy cells as well as cancer cells. Report any side effects. Continue your course of treatment even though you feel ill unless your doctor tells you to stop. In some cases, you may be given additional medicines to help with side effects. Follow all directions for their use. Call your doctor or health care professional for advice if you get a fever, chills or sore throat, or other symptoms of a cold or flu. Do not treat yourself. This drug decreases your body's ability to fight infections. Try to avoid being around people who are sick. This medicine may increase your risk to bruise or bleed. Call your doctor or health care professional if you notice any unusual bleeding. Be careful brushing and flossing your teeth or using a toothpick because you may get an infection or bleed more easily. If you have any dental work done, tell your dentist you are receiving this medicine. Avoid taking products that contain aspirin, acetaminophen, ibuprofen, naproxen, or ketoprofen unless instructed by your doctor. These medicines may hide a fever. Women should inform their doctor if they wish to become pregnant or think they might be pregnant. There is a potential for serious side effects to an unborn child. Talk to your health care professional or pharmacist for more information. Do not breast-feed an infant while taking this medicine. What side effects may I notice from receiving this medicine? Side effects that you should report to your doctor or health care professional as   soon as possible: -allergic reactions like skin rash, itching or hives, swelling of the face, lips, or tongue -low blood counts - this medicine may decrease the number of white blood cells,  red blood cells and platelets. You may be at increased risk for infections and bleeding. -signs of infection - fever or chills, cough, sore throat, pain or difficulty passing urine -signs of decreased platelets or bleeding - bruising, pinpoint red spots on the skin, black, tarry stools, blood in the urine -signs of decreased red blood cells - unusually weak or tired, fainting spells, lightheadedness -breathing problems -chest pain -mouth sores -nausea and vomiting -pain, swelling, redness at site where injected -pain, tingling, numbness in the hands or feet -stomach pain -swelling of ankles, feet, hands -unusual bleeding Side effects that usually do not require medical attention (report to your doctor or health care professional if they continue or are bothersome): -constipation -diarrhea -hair loss -loss of appetite -stomach upset This list may not describe all possible side effects. Call your doctor for medical advice about side effects. You may report side effects to FDA at 1-800-FDA-1088. Where should I keep my medicine? This drug is given in a hospital or clinic and will not be stored at home. NOTE: This sheet is a summary. It may not cover all possible information. If you have questions about this medicine, talk to your doctor, pharmacist, or health care provider.  2015, Elsevier/Gold Standard. (2007-10-18 18:45:54) Carboplatin injection What is this medicine? CARBOPLATIN (KAR boe pla tin) is a chemotherapy drug. It targets fast dividing cells, like cancer cells, and causes these cells to die. This medicine is used to treat ovarian cancer and many other cancers. This medicine may be used for other purposes; ask your health care provider or pharmacist if you have questions. COMMON BRAND NAME(S): Paraplatin What should I tell my health care provider before I take this medicine? They need to know if you have any of these conditions: -blood disorders -hearing problems -kidney  disease -recent or ongoing radiation therapy -an unusual or allergic reaction to carboplatin, cisplatin, other chemotherapy, other medicines, foods, dyes, or preservatives -pregnant or trying to get pregnant -breast-feeding How should I use this medicine? This drug is usually given as an infusion into a vein. It is administered in a hospital or clinic by a specially trained health care professional. Talk to your pediatrician regarding the use of this medicine in children. Special care may be needed. Overdosage: If you think you have taken too much of this medicine contact a poison control center or emergency room at once. NOTE: This medicine is only for you. Do not share this medicine with others. What if I miss a dose? It is important not to miss a dose. Call your doctor or health care professional if you are unable to keep an appointment. What may interact with this medicine? -medicines for seizures -medicines to increase blood counts like filgrastim, pegfilgrastim, sargramostim -some antibiotics like amikacin, gentamicin, neomycin, streptomycin, tobramycin -vaccines Talk to your doctor or health care professional before taking any of these medicines: -acetaminophen -aspirin -ibuprofen -ketoprofen -naproxen This list may not describe all possible interactions. Give your health care provider a list of all the medicines, herbs, non-prescription drugs, or dietary supplements you use. Also tell them if you smoke, drink alcohol, or use illegal drugs. Some items may interact with your medicine. What should I watch for while using this medicine? Your condition will be monitored carefully while you are receiving this medicine. You will need   important blood work done while you are taking this medicine. This drug may make you feel generally unwell. This is not uncommon, as chemotherapy can affect healthy cells as well as cancer cells. Report any side effects. Continue your course of treatment even  though you feel ill unless your doctor tells you to stop. In some cases, you may be given additional medicines to help with side effects. Follow all directions for their use. Call your doctor or health care professional for advice if you get a fever, chills or sore throat, or other symptoms of a cold or flu. Do not treat yourself. This drug decreases your body's ability to fight infections. Try to avoid being around people who are sick. This medicine may increase your risk to bruise or bleed. Call your doctor or health care professional if you notice any unusual bleeding. Be careful brushing and flossing your teeth or using a toothpick because you may get an infection or bleed more easily. If you have any dental work done, tell your dentist you are receiving this medicine. Avoid taking products that contain aspirin, acetaminophen, ibuprofen, naproxen, or ketoprofen unless instructed by your doctor. These medicines may hide a fever. Do not become pregnant while taking this medicine. Women should inform their doctor if they wish to become pregnant or think they might be pregnant. There is a potential for serious side effects to an unborn child. Talk to your health care professional or pharmacist for more information. Do not breast-feed an infant while taking this medicine. What side effects may I notice from receiving this medicine? Side effects that you should report to your doctor or health care professional as soon as possible: -allergic reactions like skin rash, itching or hives, swelling of the face, lips, or tongue -signs of infection - fever or chills, cough, sore throat, pain or difficulty passing urine -signs of decreased platelets or bleeding - bruising, pinpoint red spots on the skin, black, tarry stools, nosebleeds -signs of decreased red blood cells - unusually weak or tired, fainting spells, lightheadedness -breathing problems -changes in hearing -changes in vision -chest pain -high  blood pressure -low blood counts - This drug may decrease the number of white blood cells, red blood cells and platelets. You may be at increased risk for infections and bleeding. -nausea and vomiting -pain, swelling, redness or irritation at the injection site -pain, tingling, numbness in the hands or feet -problems with balance, talking, walking -trouble passing urine or change in the amount of urine Side effects that usually do not require medical attention (report to your doctor or health care professional if they continue or are bothersome): -hair loss -loss of appetite -metallic taste in the mouth or changes in taste This list may not describe all possible side effects. Call your doctor for medical advice about side effects. You may report side effects to FDA at 1-800-FDA-1088. Where should I keep my medicine? This drug is given in a hospital or clinic and will not be stored at home. NOTE: This sheet is a summary. It may not cover all possible information. If you have questions about this medicine, talk to your doctor, pharmacist, or health care provider.  2015, Elsevier/Gold Standard. (2007-09-13 14:38:05)  

## 2014-06-27 ENCOUNTER — Other Ambulatory Visit: Payer: Self-pay | Admitting: *Deleted

## 2014-06-27 ENCOUNTER — Ambulatory Visit (HOSPITAL_BASED_OUTPATIENT_CLINIC_OR_DEPARTMENT_OTHER): Payer: Medicare Other | Admitting: Nurse Practitioner

## 2014-06-27 ENCOUNTER — Telehealth: Payer: Self-pay | Admitting: *Deleted

## 2014-06-27 VITALS — BP 142/72 | HR 98 | Temp 98.7°F | Resp 20 | Ht 66.0 in | Wt 249.7 lb

## 2014-06-27 DIAGNOSIS — S0500XA Injury of conjunctiva and corneal abrasion without foreign body, unspecified eye, initial encounter: Secondary | ICD-10-CM

## 2014-06-27 DIAGNOSIS — C50512 Malignant neoplasm of lower-outer quadrant of left female breast: Secondary | ICD-10-CM

## 2014-06-27 DIAGNOSIS — S0501XA Injury of conjunctiva and corneal abrasion without foreign body, right eye, initial encounter: Secondary | ICD-10-CM

## 2014-06-27 DIAGNOSIS — D61818 Other pancytopenia: Secondary | ICD-10-CM

## 2014-06-27 MED ORDER — ERYTHROMYCIN 5 MG/GM OP OINT: 1.0000 "application " | TOPICAL_OINTMENT | Freq: Two times a day (BID) | OPHTHALMIC | Status: DC

## 2014-06-27 NOTE — Telephone Encounter (Signed)
Received a call from patient that her right eye has been reddened and painful since yesterday. She states she saw Susanne Borders, NP on Monday and her eye was watery then but was not red or painful at that time. She has been using OTC allergy eye drops since Monday. Denies any trauma to the eye and states it is not itchy. Patient agreeable to see Selena Lesser, NP at 11:30. POF in and spoke with [employer], scheduler, who will make patient appt.

## 2014-06-28 ENCOUNTER — Telehealth: Payer: Self-pay | Admitting: *Deleted

## 2014-06-28 ENCOUNTER — Encounter: Payer: Self-pay | Admitting: Nurse Practitioner

## 2014-06-28 DIAGNOSIS — D61818 Other pancytopenia: Secondary | ICD-10-CM | POA: Insufficient documentation

## 2014-06-28 NOTE — Assessment & Plan Note (Signed)
Patient's right eye with sclera erythema and tenderness.  There is no conjunctival or periorbital edema on exam.  Patient does have constant tearing of the right eye however.  No purulent drainage whatsoever on exam.  No foreign body noted on exam.  No obvious corneal abrasion on exam.  Patient reports that her vision remains unchanged.  Have arranged for patient to follow-up for further evaluation and treatment with Dr. Rosana Hoes at [facility] ophthalmology tomorrow 06/28/2014 at 3 PM.  In the interim-a will prescribe patient erythromycin ointment to use to the affected eye.  Also advised patient to go directly to the emergency department overnight if she develops any worsening symptoms whatsoever.  Will also forward all recent labs and office notes to Dr. Rosana Hoes for his review prior to seeing patient tomorrow afternoon.

## 2014-06-28 NOTE — Assessment & Plan Note (Signed)
Patient received cycle 1, day 15 of her carboplatin/gemcitabine chemotherapy regimen on 06/25/2014.  She is scheduled to return 07/02/2014 for her next cycle of chemotherapy.  Following the completion of patient's chemotherapy regimen-patient will undergo radiation therapy.

## 2014-06-28 NOTE — Progress Notes (Signed)
Dr. Rosana Hoes at [facility].  Main # [phone].  Fax # [phone].

## 2014-06-28 NOTE — Progress Notes (Signed)
will   SYMPTOM MANAGEMENT CLINIC   HPI: Monica [name] 69 y.o. female diagnosed with breast cancer.  Patient is status post lumpectomy.  Currently undergoing carboplatin/gemcitabine chemotherapy regimen.  Patient received cycle 1, day 15 of her carboplatin/gemcitabine chemotherapy regimen on 06/25/2014.  She called the cancer Center today reporting acute onset right eye pain, redness, and continual tearing.  She denies any known injury or trauma to her eye.  She denies any purulent drainage or vision changes.  She denies any recent fevers or chills.   HPI  CURRENT THERAPY: Upcoming Treatment Dates - BREAST Carboplatin / Gemcitabine D1,8 q21d Days with orders from any treatment category:  06/25/2014      SCHEDULING COMMUNICATION      ondansetron (ZOFRAN) IVPB 8 mg      dexamethasone (DECADRON) injection 10 mg      Gemcitabine HCl (GEMZAR) 1,824 mg in sodium chloride 0.9 % 100 mL chemo infusion      CARBOplatin (PARAPLATIN) 250 mg in sodium chloride 0.9 % 100 mL chemo infusion      sodium chloride 0.9 % injection 10 mL      heparin lock flush 100 unit/mL      heparin lock flush 100 unit/mL      alteplase (CATHFLO ACTIVASE) injection 2 mg      sodium chloride 0.9 % injection 3 mL      Cold Pack 1 packet      0.9 %  sodium chloride infusion      TREATMENT CONDITIONS 07/02/2014      SCHEDULING COMMUNICATION      ondansetron (ZOFRAN) IVPB 8 mg      dexamethasone (DECADRON) injection 10 mg      Gemcitabine HCl (GEMZAR) 1,824 mg in sodium chloride 0.9 % 100 mL chemo infusion      CARBOplatin (PARAPLATIN) 250 mg in sodium chloride 0.9 % 100 mL chemo infusion      sodium chloride 0.9 % injection 10 mL      heparin lock flush 100 unit/mL      heparin lock flush 100 unit/mL      alteplase (CATHFLO ACTIVASE) injection 2 mg      sodium chloride 0.9 % injection 3 mL      Cold Pack 1 packet      0.9 %  sodium chloride infusion      TREATMENT CONDITIONS 07/16/2014      SCHEDULING  COMMUNICATION      ondansetron (ZOFRAN) IVPB 8 mg      dexamethasone (DECADRON) injection 10 mg      Gemcitabine HCl (GEMZAR) 1,824 mg in sodium chloride 0.9 % 100 mL chemo infusion      CARBOplatin (PARAPLATIN) 250 mg in sodium chloride 0.9 % 100 mL chemo infusion      sodium chloride 0.9 % injection 10 mL      heparin lock flush 100 unit/mL      heparin lock flush 100 unit/mL      alteplase (CATHFLO ACTIVASE) injection 2 mg      sodium chloride 0.9 % injection 3 mL      Cold Pack 1 packet      0.9 %  sodium chloride infusion      TREATMENT CONDITIONS    ROS  Past Medical History  Diagnosis Date  . Allergy codeine    rapid heart beat, cold sweat  . Arthritis   . Breast cancer   . Hypertension     Past Surgical History  Procedure  Laterality Date  . Knee arthroscopy  2012    right  . Lumbar laminectomy  1991  . Colonoscopy    . Foot arthroplasty  1998    right  . Abdominal hysterectomy  1992  . Breast lumpectomy with needle localization and axillary sentinel lymph node bx Left 02/12/2014    Procedure: LEFT BREAST LUMPECTOMY WITH NEEDLE LOCALIZATION AND LEFT AXILLARY SENTINEL LYMPH NODE BX;  Surgeon: [name], MD;  Location: [place];  Service: General;  Laterality: Left;  . Portacath placement Right 02/12/2014    Procedure: INSERTION PORT-A-CATH;  Surgeon: [name], MD;  Location: [facility];  Service: General;  Laterality: Right;    has Breast cancer of lower-outer quadrant of left female breast; Nausea without vomiting; Heartburn; Thrush; Heart palpitations; Itchy eyes; Febrile neutropenia; Hypokalemia; Hypertension; UTI (urinary tract infection); Insomnia; Decreased appetite; Body aches; Dyspepsia; Chemotherapy-induced neuropathy; Anemia associated with chemotherapy; Thrombocytopenia; Corneal abrasion; and Other pancytopenia on her problem list.     is allergic to codeine.    Medication List       This list is  accurate as of: 06/27/14 11:59 PM.  Always use your most recent med list.               amLODipine 5 MG tablet  Commonly known as:  NORVASC  Take 5 mg by mouth daily.     ciprofloxacin 500 MG tablet  Commonly known as:  CIPRO  Take 1 tablet (500 mg total) by mouth 2 (two) times daily.     dexamethasone 4 MG tablet  Commonly known as:  DECADRON  Take 4-8 mg by mouth 2 (two) times daily. Take one tablet the day after chemo and 2 tabs for 4 days after     dextromethorphan-guaiFENesin 30-600 MG per 12 hr tablet  Commonly known as:  MUCINEX DM  Take 1 tablet by mouth 2 (two) times daily.     docusate sodium 100 MG capsule  Commonly known as:  COLACE  Take 100 mg by mouth 2 (two) times daily.     erythromycin ophthalmic ointment  Place 1 application into the right eye 2 (two) times daily.     fluconazole 150 MG tablet  Commonly known as:  DIFLUCAN  Take 1 tablet (150 mg total) by mouth as directed.     Ginkgo Biloba 120 MG Caps  Take 1 capsule by mouth daily.     HYDROcodone-acetaminophen 5-325 MG per tablet  Commonly known as:  NORCO/VICODIN  Take 1 tablet by mouth every 4 (four) hours as needed for moderate pain.     ibuprofen 200 MG tablet  Commonly known as:  ADVIL,MOTRIN  Take 400 mg by mouth daily.     lidocaine-prilocaine cream  Commonly known as:  EMLA     LORazepam 0.5 MG tablet  Commonly known as:  ATIVAN  Take 1 tablet (0.5 mg total) by mouth at bedtime as needed (Nausea or vomiting).     magic mouthwash Soln  Take 5-10 mLs by mouth 4 (four) times daily.     Melatonin 5 MG Tabs  Take 1 tablet by mouth at bedtime as needed (sleep).     nystatin-triamcinolone ointment  Commonly known as:  MYCOLOG     ondansetron 8 MG tablet  Commonly known as:  ZOFRAN  TAKE ONE TABLET BY MOUTH TWICE DAILY AS NEEDED START  ON  THE  THIRD  DAY  AFTER  CHEMOTHERAPY     oxyCODONE  5 MG immediate release tablet  Commonly known as:  Oxy IR/ROXICODONE  Take 1-2 tablets (5-10  mg total) by mouth every 4 (four) hours as needed for moderate pain, severe pain or breakthrough pain.     potassium chloride 10 MEQ CR capsule  Commonly known as:  MICRO-K  Take 2 capsules (20 mEq total) by mouth daily.     prochlorperazine 10 MG tablet  Commonly known as:  COMPAZINE  Take 1 tablet (10 mg total) by mouth every 6 (six) hours as needed (Nausea or vomiting).     traZODone 50 MG tablet  Commonly known as:  DESYREL  Take 1 tablet (50 mg total) by mouth at bedtime.     UNABLE TO FIND  Take 3 tablets by mouth 2 (two) times daily. OMEGA XL     valsartan-hydrochlorothiazide 320-25 MG per tablet  Commonly known as:  DIOVAN-HCT  Take 1 tablet by mouth daily.     Vitamin D3 2000 UNITS Tabs  Take 1 tablet by mouth daily.         PHYSICAL EXAMINATION  Blood pressure 142/72, pulse 98, temperature 98.7 F (37.1 C), temperature source Oral, resp. rate 20, height '5\' 6"'  (1.676 m), weight 249 lb 11.2 oz (113.263 kg).  Physical Exam  Constitutional: She is oriented to person, place, and time. She appears unhealthy.  HENT:  Head: Normocephalic and atraumatic.  Mouth/Throat: Oropharynx is clear and moist.  Eyes: Conjunctivae and EOM are normal. Pupils are equal, round, and reactive to light. Right eye exhibits discharge. Left eye exhibits no discharge. No scleral icterus.  Right sclera very red and tender on exam.  No purulent drainage noted.  Patient does have continual tearing of right eye.  No obvious foreign body or corneal abrasion on exam.  No periorbital swelling.  Neck: Normal range of motion.  Pulmonary/Chest: Effort normal. No respiratory distress.  Musculoskeletal: Normal range of motion.  Neurological: She is alert and oriented to person, place, and time. Gait normal.  Skin: Skin is warm and dry.  Psychiatric: Affect normal.  Nursing note and vitals reviewed.   LABORATORY DATA:. Appointment on 06/25/2014  Component Date Value Ref Range Status  . WBC  06/25/2014 5.6  3.9 - 10.3 10e3/uL Final  . NEUT# 06/25/2014 4.2  1.5 - 6.5 10e3/uL Final  . HGB 06/25/2014 9.4* 11.6 - 15.9 g/dL Final  . HCT 06/25/2014 28.4* 34.8 - 46.6 % Final  . Platelets 06/25/2014 35* 145 - 400 10e3/uL Final  . MCV 06/25/2014 86.6  79.5 - 101.0 fL Final  . MCH 06/25/2014 28.7  25.1 - 34.0 pg Final  . MCHC 06/25/2014 33.1  31.5 - 36.0 g/dL Final  . RBC 06/25/2014 3.28* 3.70 - 5.45 10e6/uL Final  . RDW 06/25/2014 17.1* 11.2 - 14.5 % Final  . lymph# 06/25/2014 1.0  0.9 - 3.3 10e3/uL Final  . MONO# 06/25/2014 0.3  0.1 - 0.9 10e3/uL Final  . Eosinophils Absolute 06/25/2014 0.1  0.0 - 0.5 10e3/uL Final  . Basophils Absolute 06/25/2014 0.0  0.0 - 0.1 10e3/uL Final  . NEUT% 06/25/2014 75.7  38.4 - 76.8 % Final  . LYMPH% 06/25/2014 17.8  14.0 - 49.7 % Final  . MONO% 06/25/2014 4.5  0.0 - 14.0 % Final  . EOS% 06/25/2014 1.6  0.0 - 7.0 % Final  . BASO% 06/25/2014 0.4  0.0 - 2.0 % Final  . nRBC 06/25/2014 0  0 - 0 % Final  . Sodium 06/25/2014 140  136 - 145  mEq/L Final  . Potassium 06/25/2014 3.9  3.5 - 5.1 mEq/L Final  . Chloride 06/25/2014 103  98 - 109 mEq/L Final  . CO2 06/25/2014 28  22 - 29 mEq/L Final  . Glucose 06/25/2014 150* 70 - 140 mg/dl Final  . BUN 06/25/2014 13.7  7.0 - 26.0 mg/dL Final  . Creatinine 06/25/2014 0.8  0.6 - 1.1 mg/dL Final  . Total Bilirubin 06/25/2014 0.38  0.20 - 1.20 mg/dL Final  . Alkaline Phosphatase 06/25/2014 100  40 - 150 U/L Final  . AST 06/25/2014 28  5 - 34 U/L Final  . ALT 06/25/2014 48  0 - 55 U/L Final  . Total Protein 06/25/2014 6.6  6.4 - 8.3 g/dL Final  . Albumin 06/25/2014 3.4* 3.5 - 5.0 g/dL Final  . Calcium 06/25/2014 9.7  8.4 - 10.4 mg/dL Final  . Anion Gap 06/25/2014 9  3 - 11 mEq/L Final  . EGFR 06/25/2014 86* >90 ml/min/1.73 m2 Final   eGFR is calculated using the CKD-EPI Creatinine Equation (2009)     RADIOGRAPHIC STUDIES: No results found.  ASSESSMENT/PLAN:    Breast cancer of lower-outer quadrant of left  female breast Patient received cycle 1, day 15 of her carboplatin/gemcitabine chemotherapy regimen on 06/25/2014.  She is scheduled to return 07/02/2014 for her next cycle of chemotherapy.  Following the completion of patient's chemotherapy regimen-patient will undergo radiation therapy.  Corneal abrasion Patient's right eye with sclera erythema and tenderness.  There is no conjunctival or periorbital edema on exam.  Patient does have constant tearing of the right eye however.  No purulent drainage whatsoever on exam.  No foreign body noted on exam.  No obvious corneal abrasion on exam.  Patient reports that her vision remains unchanged.  Have arranged for patient to follow-up for further evaluation and treatment with Dr. Rosana Hoes at [facility] ophthalmology tomorrow 06/28/2014 at 3 PM.  In the interim-a will prescribe patient erythromycin ointment to use to the affected eye.  Also advised patient to go directly to the emergency department overnight if she develops any worsening symptoms whatsoever.  Will also forward all recent labs and office notes to Dr. Rosana Hoes for his review prior to seeing patient tomorrow afternoon.  Other pancytopenia Patient last received chemotherapy on generally fourth 2016.  Patient's white count and hemoglobin remained stable.  However, platelet count has decreased to 35.  This is most likely a chemotherapy side effect.  Will continue to monitor closely.  Patient stated understanding of all instructions; and was in agreement with this plan of care. The patient knows to call the clinic with any problems, questions or concerns.   Review/collaboration with Dr. Jana Hakim regarding all aspects of patient's visit today.   Total time spent with patient was 25 minutes;  with greater than 75 percent of that time spent in face to face counseling regarding his symptoms, and coordination of care and follow up.  Disclaimer: This note was dictated with voice recognition software. Similar  sounding words can inadvertently be transcribed and may not be corrected upon review.   Drue Second, NP 06/28/2014

## 2014-06-28 NOTE — Telephone Encounter (Signed)
Patient is aware of appointment date and time at [facility] Labs and last office note faxed to [facility]. Called office and verified they received fax. Patient verbalized understanding.

## 2014-06-28 NOTE — Assessment & Plan Note (Signed)
Patient last received chemotherapy on generally fourth 2016.  Patient's white count and hemoglobin remained stable.  However, platelet count has decreased to 35.  This is most likely a chemotherapy side effect.  Will continue to monitor closely.

## 2014-07-02 ENCOUNTER — Encounter: Payer: Self-pay | Admitting: Nurse Practitioner

## 2014-07-02 ENCOUNTER — Ambulatory Visit (HOSPITAL_BASED_OUTPATIENT_CLINIC_OR_DEPARTMENT_OTHER): Payer: BLUE CROSS/BLUE SHIELD | Admitting: Nurse Practitioner

## 2014-07-02 ENCOUNTER — Ambulatory Visit (HOSPITAL_BASED_OUTPATIENT_CLINIC_OR_DEPARTMENT_OTHER): Payer: BLUE CROSS/BLUE SHIELD

## 2014-07-02 ENCOUNTER — Other Ambulatory Visit (HOSPITAL_BASED_OUTPATIENT_CLINIC_OR_DEPARTMENT_OTHER): Payer: BLUE CROSS/BLUE SHIELD

## 2014-07-02 VITALS — BP 147/71 | HR 83 | Temp 99.1°F | Resp 19 | Ht 66.0 in | Wt 250.2 lb

## 2014-07-02 DIAGNOSIS — C50512 Malignant neoplasm of lower-outer quadrant of left female breast: Secondary | ICD-10-CM

## 2014-07-02 DIAGNOSIS — Z5111 Encounter for antineoplastic chemotherapy: Secondary | ICD-10-CM

## 2014-07-02 DIAGNOSIS — Z171 Estrogen receptor negative status [ER-]: Secondary | ICD-10-CM

## 2014-07-02 LAB — CBC WITH DIFFERENTIAL/PLATELET
BASO%: 0.5 % (ref 0.0–2.0)
Basophils Absolute: 0 10*3/uL (ref 0.0–0.1)
EOS%: 0.7 % (ref 0.0–7.0)
Eosinophils Absolute: 0 10*3/uL (ref 0.0–0.5)
HCT: 31.1 % — ABNORMAL LOW (ref 34.8–46.6)
HEMOGLOBIN: 10.1 g/dL — AB (ref 11.6–15.9)
LYMPH%: 19.4 % (ref 14.0–49.7)
MCH: 29.3 pg (ref 25.1–34.0)
MCHC: 32.5 g/dL (ref 31.5–36.0)
MCV: 90.1 fL (ref 79.5–101.0)
MONO#: 1 10*3/uL — ABNORMAL HIGH (ref 0.1–0.9)
MONO%: 16.9 % — ABNORMAL HIGH (ref 0.0–14.0)
NEUT#: 3.5 10*3/uL (ref 1.5–6.5)
NEUT%: 62.5 % (ref 38.4–76.8)
PLATELETS: 303 10*3/uL (ref 145–400)
RBC: 3.45 10*6/uL — AB (ref 3.70–5.45)
RDW: 19.5 % — AB (ref 11.2–14.5)
WBC: 5.7 10*3/uL (ref 3.9–10.3)
lymph#: 1.1 10*3/uL (ref 0.9–3.3)

## 2014-07-02 LAB — COMPREHENSIVE METABOLIC PANEL (CC13)
ALT: 37 U/L (ref 0–55)
ANION GAP: 9 meq/L (ref 3–11)
AST: 33 U/L (ref 5–34)
Albumin: 3.5 g/dL (ref 3.5–5.0)
Alkaline Phosphatase: 87 U/L (ref 40–150)
BILIRUBIN TOTAL: 0.29 mg/dL (ref 0.20–1.20)
BUN: 13.1 mg/dL (ref 7.0–26.0)
CALCIUM: 9.5 mg/dL (ref 8.4–10.4)
CO2: 27 meq/L (ref 22–29)
CREATININE: 0.8 mg/dL (ref 0.6–1.1)
Chloride: 106 mEq/L (ref 98–109)
EGFR: 84 mL/min/{1.73_m2} — AB (ref >90)
Glucose: 121 mg/dl (ref 70–140)
Potassium: 3.6 mEq/L (ref 3.5–5.1)
SODIUM: 142 meq/L (ref 136–145)
Total Protein: 6.7 g/dL (ref 6.4–8.3)

## 2014-07-02 MED ORDER — HEPARIN SOD (PORK) LOCK FLUSH 100 UNIT/ML IV SOLN
500.0000 [IU] | Freq: Once | INTRAVENOUS | Status: AC | PRN
  Administered 2014-07-02: 500 [IU]
  Filled 2014-07-02: qty 5

## 2014-07-02 MED ORDER — SODIUM CHLORIDE 0.9 % IJ SOLN
10.0000 mL | INTRAMUSCULAR | Status: DC | PRN
  Administered 2014-07-02: 10 mL
  Filled 2014-07-02: qty 10

## 2014-07-02 MED ORDER — ONDANSETRON 8 MG/50ML IVPB (CHCC)
8.0000 mg | Freq: Once | INTRAVENOUS | Status: AC
  Administered 2014-07-02: 8 mg via INTRAVENOUS

## 2014-07-02 MED ORDER — SODIUM CHLORIDE 0.9 % IV SOLN
800.0000 mg/m2 | Freq: Once | INTRAVENOUS | Status: AC
  Administered 2014-07-02: 1824 mg via INTRAVENOUS
  Filled 2014-07-02: qty 47.97

## 2014-07-02 MED ORDER — SODIUM CHLORIDE 0.9 % IV SOLN
247.8000 mg | Freq: Once | INTRAVENOUS | Status: AC
  Administered 2014-07-02: 250 mg via INTRAVENOUS
  Filled 2014-07-02: qty 25

## 2014-07-02 MED ORDER — ONDANSETRON 8 MG/NS 50 ML IVPB
INTRAVENOUS | Status: AC
  Filled 2014-07-02: qty 8

## 2014-07-02 MED ORDER — DEXAMETHASONE SODIUM PHOSPHATE 10 MG/ML IJ SOLN
10.0000 mg | Freq: Once | INTRAMUSCULAR | Status: AC
  Administered 2014-07-02: 10 mg via INTRAVENOUS

## 2014-07-02 MED ORDER — SODIUM CHLORIDE 0.9 % IV SOLN
Freq: Once | INTRAVENOUS | Status: AC
  Administered 2014-07-02: 10:00:00 via INTRAVENOUS

## 2014-07-02 MED ORDER — DEXAMETHASONE SODIUM PHOSPHATE 10 MG/ML IJ SOLN
INTRAMUSCULAR | Status: AC
  Filled 2014-07-02: qty 1

## 2014-07-02 NOTE — Progress Notes (Signed)
[facility]  Telephone:([phone]) [phone] Fax:(336) [phone]     ID: Monica [name] DOB: 11-16-1945  MR#: [id number]  QPR#:[id number]  Patient Care Team: Maury Dus, MD as PCP - General (Family Medicine) Excell Seltzer, MD as Consulting Physician (General Surgery) Chauncey Cruel, MD as Consulting Physician (Oncology) Jodelle Gross, MD as Consulting Physician (Radiation Oncology) Maisie Fus, MD as Consulting Physician (Obstetrics and Gynecology)   CHIEF COMPLAINT: Newly diagnosed breast cancer CURRENT TREATMENT:  On adjuvant chemotherapy  BREAST CANCER HISTORY: From the original intake note:  [facility] showing a possible mass in her left breast. On 01/05/2014 left diagnostic mammography and ultrasonography at the breast Center showed 2 adjacent poorly defined masses deep in the lower outer portion of the left breast. On physical exam these were palpable measuring approximately 3 cm altogether, at the 4:00 position 16 cm from the left nipple. There were no palpable left axillary lymph nodes. Ultrasound confirmed to and adjacent irregularly marginated hypoechoic masses in the area in question, measuring 1.5 and 1.0 cm respectively. The total area of by ultrasonography measured 2.6 cm. In addition, to left axillary lymph nodes showed mild cortical thickening in one of them showed loss of the normal fatty hilum.  On 01/10/2014 the patient underwent separate biopsies of both the left breast masses in question as well as the more suspicious lymph node. The final pathology ([id number]) showed the lymph node to be benign. (This was interpreted as concordant). Both of the breast masses, however, were positive for an invasive ductal carcinoma, which was HER-2 not amplified, with the signals ratio of 0.98 and the number per cell being 2.45. Estrogen and progesterone receptor determination is pending but looks negative at this  point.  On 01/16/2014 the patient underwent bilateral breast MRI the right breast was unremarkable. In the left breast lower outer quadrant there were 2 oval masses measuring 1.5 and 2.2 cm. Taken together they was approximately 4 cm of disease. There were no findings of concern in the right breast. In the left axilla there was a single mildly prominent lymph node. There was no suspicious internal mammary adenopathy.  The patient's subsequent history is as detailed below  INTERVAL HISTORY: Monica [name] returns today for follow up of her breast cancer accompanied by her goddaughter. Today is day 1, cycle 2 of 3 cycles plannedof carboplatin and gemcitabine, both drugs given days 1 and 8 of each 21 day cycle, with Neulasta on day 9. She visited an ophthalmologist for her right eye erythema and tenderness. She was diagnosed with iritis and is own steroid drops which have greatly improved her symptoms. She returns for a follow up visit this Friday.  REVIEW OF SYSTEMS: Monica [name] denies fevers, chills, or changes in bowel or bladder habits. She uses "queezy pops" and her PRN antiemetics for her nausea. Her appetite is only fair, and decreases acutely after chemo. She uses Boost to supplement her meals. She denies mouth sores or rashes. The tingling to her left heel comes and goes. Fatigue is her main complaint, especially with this regimen. She denies shortness of breath, chest pain, cough or palpitations.   PAST MEDICAL HISTORY: Past Medical History  Diagnosis Date  . Allergy codeine    rapid heart beat, cold sweat  . Arthritis   . Breast cancer   . Hypertension     PAST SURGICAL HISTORY: Past Surgical History  Procedure Laterality Date  . Knee arthroscopy  2012  right  . Lumbar laminectomy  1991  . Colonoscopy    . Foot arthroplasty  1998    right  . Abdominal hysterectomy  1992  . Breast lumpectomy with needle localization and axillary sentinel lymph node bx Left 02/12/2014    Procedure: LEFT  BREAST LUMPECTOMY WITH NEEDLE LOCALIZATION AND LEFT AXILLARY SENTINEL LYMPH NODE BX;  Surgeon: [name], MD;  Location: [facility];  Service: General;  Laterality: Left;  . Portacath placement Right 02/12/2014    Procedure: INSERTION PORT-A-CATH;  Surgeon: [name], MD;  Location: [facility];  Service: General;  Laterality: Right;    FAMILY HISTORY Family History  Problem Relation Age of Onset  . Cancer Sister   . Cancer Brother   . Cancer Maternal Aunt   . Cancer Paternal Aunt    the patient's father died at the age of 44 from a stroke. The patient's mother died at the age of 62 from "multiple causes". The patient had 2 brothers and 2 sisters. The patient's sister was diagnosed with uterine cancer at the age of 5, and the patient's brother [name] had "bone cancer" and died in his 1s. He was treated at the [facility]. There is no history of breast or ovarian cancer in the family to the patient's knowledge.  GYNECOLOGIC HISTORY:  No LMP recorded. Patient has had a hysterectomy. Menarche age 30, the patient is GX P0. She underwent hysterectomy in 1992, with no salpingo-oophorectomy. She has been on estrogen replacement since. 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 Her husband Monica [name] worked for [employer] in [place] as a Facilities manager. The patient's adopted son Monica [name] works as a laborer in [place]. The patient's god-daughter Monica [name] is an Optometrist. The patient has no grandchildren. The patient attends a local church of  [place], and her husband Monica [name] is pastor   ADVANCED DIRECTIVES: In place   HEALTH MAINTENANCE: History  Substance Use Topics  . Smoking status: Never Smoker   . Smokeless tobacco: Never Used  . Alcohol Use: No     Colonoscopy: October 2008  PAP: July 2015  Bone  density: 01/01/2014  Lipid panel:  Allergies  Allergen Reactions  . Codeine Other (See Comments)    Heart fast    Current Outpatient Prescriptions  Medication Sig Dispense Refill  . Alum & Mag Hydroxide-Simeth (MAGIC MOUTHWASH) SOLN Take 5-10 mLs by mouth 4 (four) times daily.    [name] amLODipine (NORVASC) 5 MG tablet Take 5 mg by mouth daily.    . Cholecalciferol (VITAMIN D3) 2000 UNITS TABS Take 1 tablet by mouth daily.    [name] docusate sodium (COLACE) 100 MG capsule Take 100 mg by mouth 2 (two) times daily.    . Ginkgo Biloba 120 MG CAPS Take 1 capsule by mouth daily.    [name] ibuprofen (ADVIL,MOTRIN) 200 MG tablet Take 400 mg by mouth daily.    [name] lidocaine-prilocaine (EMLA) cream     . LORazepam (ATIVAN) 0.5 MG tablet Take 1 tablet (0.5 mg total) by mouth at bedtime as needed (Nausea or vomiting). 30 tablet 0  . ondansetron (ZOFRAN) 8 MG tablet TAKE ONE TABLET BY MOUTH TWICE DAILY AS NEEDED START  ON  THE  THIRD  DAY  AFTER  CHEMOTHERAPY 30 tablet 2  . potassium chloride (MICRO-K) 10 MEQ CR capsule Take 2 capsules (20 mEq  total) by mouth daily. 120 capsule 1  . UNABLE TO FIND Take 3 tablets by mouth 2 (two) times daily. OMEGA XL    . valsartan-hydrochlorothiazide (DIOVAN-HCT) 320-25 MG per tablet Take 1 tablet by mouth daily.    . ciprofloxacin (CIPRO) 500 MG tablet Take 1 tablet (500 mg total) by mouth 2 (two) times daily. (Patient not taking: Reported on 07/02/2014) 10 tablet 0  . dextromethorphan-guaiFENesin (MUCINEX DM) 30-600 MG per 12 hr tablet Take 1 tablet by mouth 2 (two) times daily.    [name] erythromycin ophthalmic ointment Place 1 application into the right eye 2 (two) times daily. (Patient not taking: Reported on 07/02/2014) 3.5 g 0  . fluconazole (DIFLUCAN) 150 MG tablet Take 1 tablet (150 mg total) by mouth as directed. (Patient not taking: Reported on 07/02/2014) 2 tablet 0  . HYDROcodone-acetaminophen (NORCO/VICODIN) 5-325 MG per tablet Take 1 tablet by mouth every 4 (four) hours as  needed for moderate pain.     . Melatonin 5 MG TABS Take 1 tablet by mouth at bedtime as needed (sleep).    . nystatin-triamcinolone ointment (MYCOLOG)     . oxyCODONE (OXY IR/ROXICODONE) 5 MG immediate release tablet Take 1-2 tablets (5-10 mg total) by mouth every 4 (four) hours as needed for moderate pain, severe pain or breakthrough pain. (Patient not taking: Reported on 07/02/2014) 40 tablet 0  . prochlorperazine (COMPAZINE) 10 MG tablet Take 1 tablet (10 mg total) by mouth every 6 (six) hours as needed (Nausea or vomiting). (Patient not taking: Reported on 07/02/2014) 70 tablet 2  . traZODone (DESYREL) 50 MG tablet Take 1 tablet (50 mg total) by mouth at bedtime. (Patient not taking: Reported on 07/02/2014) 30 tablet 2   No current facility-administered medications for this visit.    OBJECTIVE: Middle-aged [place] American woman in no acute distress  Filed Vitals:   07/02/14 0923  BP: 147/71  Pulse: 83  Temp: 99.1 F (37.3 C)  Resp: 19     Body mass index is 40.4 kg/(m^2).    ECOG FS:1 - Symptomatic but completely ambulatory  Sclerae unicteric, pupils equal and reactive Oropharynx clear and moist-- no thrush No cervical or supraclavicular adenopathy Lungs no rales or rhonchi Heart regular rate and rhythm Abd soft, nontender, positive bowel sounds MSK no focal spinal tenderness, no upper extremity lymphedema Neuro: nonfocal, well oriented, appropriate affect Breasts: deferred  LAB RESULTS:  CMP     Component Value Date/Time   NA 140 06/25/2014 0909   NA 141 04/18/2014 0444   K 3.9 06/25/2014 0909   K 3.7 04/18/2014 0444   CL 106 04/18/2014 0444   CO2 28 06/25/2014 0909   CO2 24 04/18/2014 0444   GLUCOSE 150* 06/25/2014 0909   GLUCOSE 127* 04/18/2014 0444   BUN 13.7 06/25/2014 0909   BUN 6 04/18/2014 0444   CREATININE 0.8 06/25/2014 0909   CREATININE 0.66 04/18/2014 0444   CALCIUM 9.7 06/25/2014 0909   CALCIUM 8.7 04/18/2014 0444   PROT 6.6 06/25/2014 0909   PROT  5.8* 04/16/2014 0515   ALBUMIN 3.4* 06/25/2014 0909   ALBUMIN 2.6* 04/16/2014 0515   AST 28 06/25/2014 0909   AST 12 04/16/2014 0515   ALT 48 06/25/2014 0909   ALT 15 04/16/2014 0515   ALKPHOS 100 06/25/2014 0909   ALKPHOS 90 04/16/2014 0515   BILITOT 0.38 06/25/2014 0909   BILITOT 0.5 04/16/2014 0515   GFRNONAA >90 04/18/2014 0444   GFRAA >90 04/18/2014 0444    I No  results found for: SPEP  Lab Results  Component Value Date   WBC 5.7 07/02/2014   NEUTROABS 3.5 07/02/2014   HGB 10.1* 07/02/2014   HCT 31.1* 07/02/2014   MCV 90.1 07/02/2014   PLT 303 07/02/2014      Chemistry      Component Value Date/Time   NA 140 06/25/2014 0909   NA 141 04/18/2014 0444   K 3.9 06/25/2014 0909   K 3.7 04/18/2014 0444   CL 106 04/18/2014 0444   CO2 28 06/25/2014 0909   CO2 24 04/18/2014 0444   BUN 13.7 06/25/2014 0909   BUN 6 04/18/2014 0444   CREATININE 0.8 06/25/2014 0909   CREATININE 0.66 04/18/2014 0444      Component Value Date/Time   CALCIUM 9.7 06/25/2014 0909   CALCIUM 8.7 04/18/2014 0444   ALKPHOS 100 06/25/2014 0909   ALKPHOS 90 04/16/2014 0515   AST 28 06/25/2014 0909   AST 12 04/16/2014 0515   ALT 48 06/25/2014 0909   ALT 15 04/16/2014 0515   BILITOT 0.38 06/25/2014 0909   BILITOT 0.5 04/16/2014 0515       No results found for: LABCA2  No components found for: QIHKV425  No results for input(s): INR in the last 168 hours.  Urinalysis    Component Value Date/Time   COLORURINE YELLOW 04/15/2014 1814    STUDIES: Most recent echocardiogram on 04/03/14 showed a well preserved ejection fraction of 60-65%    ASSESSMENT: 69 y.o. [place] woman status post left breast biopsy of 2 separate lower outer quadrant masses 01/10/2014 for a multifocal cT2 cNX, stage II invasive ductal carcinoma, grade 3, with an MIB-1 of 90%, HER-2 negative, estrogen receptors 3% positive with weak staining, progesterone receptor negative, with an MIB-1 of 90%; this is clinically a  "triple-negative" breast cancer and will be treated as such  (1) a suspicious left axillary lymph node biopsied 01/10/2014 was negative (concordant)  (2) left lumpectomy and sentinel lymph node sampling 02/12/2014 showed an mpT2 pN0, stage IIA invasive ductal carcinoma grade 3, repeat HER-2 again negative  (3) adjuvant chemotherapy consisted of  (a) dose dense doxorubicin and cyclophosphamide x4 completed 05/02/2014  (b) weekly paclitaxel and carboplatin with 3 of 12 doses given, then stopped because of neuropathy  (c) Carboplatin and Gemzar started 06/11/2014, given on day 1 & day 8 of a 21 days cycle for 3 planned cycles. Neulasta given on day 9 of each cycle.   (4) adjuvant radiation to follow chemotherapy  PLAN:  Monica [name] is doing well today. The labs were reviewed in detail and were entirely stable. Her platelet count is up from 35 to 303, her hgb is up from 9.4 to 10.1. She will proceed with day 1, cycle 2 of carboplatin and gemcitabine today.   Monica [name] will continue her steroid drops as advised by her ophthalmologist.   Monica [name] will return next week for labs, an office visit, and day 8 of carboplatin and gemzar. She understands and agrees with this plan. She knows the goal of treatment in her case is cure. She has been encouraged to call with any issues that might arise before her next visit here.   Marcelino Duster, [place] [phone] 07/02/2014 9:38 AM

## 2014-07-02 NOTE — Patient Instructions (Signed)
[facility] Discharge Instructions for Patients Receiving Chemotherapy  Today you received the following chemotherapy agents Gemzar/Carboplatin.  To help prevent nausea and vomiting after your treatment, we encourage you to take your nausea medication as prescribed.   If you develop nausea and vomiting that is not controlled by your nausea medication, call the clinic.   BELOW ARE SYMPTOMS THAT SHOULD BE REPORTED IMMEDIATELY:  *FEVER GREATER THAN 100.5 F  *CHILLS WITH OR WITHOUT FEVER  NAUSEA AND VOMITING THAT IS NOT CONTROLLED WITH YOUR NAUSEA MEDICATION  *UNUSUAL SHORTNESS OF BREATH  *UNUSUAL BRUISING OR BLEEDING  TENDERNESS IN MOUTH AND THROAT WITH OR WITHOUT PRESENCE OF ULCERS  *URINARY PROBLEMS  *BOWEL PROBLEMS  UNUSUAL RASH Items with * indicate a potential emergency and should be followed up as soon as possible.  Feel free to call the clinic you have any questions or concerns. The clinic phone number is [phone].    

## 2014-07-09 ENCOUNTER — Ambulatory Visit (HOSPITAL_BASED_OUTPATIENT_CLINIC_OR_DEPARTMENT_OTHER): Payer: BLUE CROSS/BLUE SHIELD

## 2014-07-09 ENCOUNTER — Other Ambulatory Visit: Payer: Self-pay | Admitting: *Deleted

## 2014-07-09 ENCOUNTER — Telehealth: Payer: Self-pay | Admitting: *Deleted

## 2014-07-09 ENCOUNTER — Encounter: Payer: Self-pay | Admitting: Nurse Practitioner

## 2014-07-09 ENCOUNTER — Telehealth: Payer: Self-pay | Admitting: Nurse Practitioner

## 2014-07-09 ENCOUNTER — Ambulatory Visit (HOSPITAL_BASED_OUTPATIENT_CLINIC_OR_DEPARTMENT_OTHER): Payer: BLUE CROSS/BLUE SHIELD | Admitting: Nurse Practitioner

## 2014-07-09 ENCOUNTER — Other Ambulatory Visit (HOSPITAL_BASED_OUTPATIENT_CLINIC_OR_DEPARTMENT_OTHER): Payer: BLUE CROSS/BLUE SHIELD

## 2014-07-09 VITALS — BP 160/72 | HR 95 | Temp 98.6°F | Resp 20 | Ht 66.0 in | Wt 251.4 lb

## 2014-07-09 DIAGNOSIS — C50512 Malignant neoplasm of lower-outer quadrant of left female breast: Secondary | ICD-10-CM

## 2014-07-09 DIAGNOSIS — Z5111 Encounter for antineoplastic chemotherapy: Secondary | ICD-10-CM

## 2014-07-09 DIAGNOSIS — R5383 Other fatigue: Secondary | ICD-10-CM

## 2014-07-09 LAB — CBC WITH DIFFERENTIAL/PLATELET
BASO%: 0.9 % (ref 0.0–2.0)
Basophils Absolute: 0.1 10*3/uL (ref 0.0–0.1)
EOS ABS: 0 10*3/uL (ref 0.0–0.5)
EOS%: 0.2 % (ref 0.0–7.0)
HCT: 30.8 % — ABNORMAL LOW (ref 34.8–46.6)
HGB: 10 g/dL — ABNORMAL LOW (ref 11.6–15.9)
LYMPH%: 16.3 % (ref 14.0–49.7)
MCH: 29.5 pg (ref 25.1–34.0)
MCHC: 32.4 g/dL (ref 31.5–36.0)
MCV: 91.1 fL (ref 79.5–101.0)
MONO#: 0.9 10*3/uL (ref 0.1–0.9)
MONO%: 14 % (ref 0.0–14.0)
NEUT%: 68.6 % (ref 38.4–76.8)
NEUTROS ABS: 4.7 10*3/uL (ref 1.5–6.5)
Platelets: 553 10*3/uL — ABNORMAL HIGH (ref 145–400)
RBC: 3.38 10*6/uL — ABNORMAL LOW (ref 3.70–5.45)
RDW: 20.1 % — ABNORMAL HIGH (ref 11.2–14.5)
WBC: 6.8 10*3/uL (ref 3.9–10.3)
lymph#: 1.1 10*3/uL (ref 0.9–3.3)

## 2014-07-09 LAB — COMPREHENSIVE METABOLIC PANEL (CC13)
ALK PHOS: 85 U/L (ref 40–150)
ALT: 56 U/L — ABNORMAL HIGH (ref 0–55)
AST: 39 U/L — ABNORMAL HIGH (ref 5–34)
Albumin: 3.6 g/dL (ref 3.5–5.0)
Anion Gap: 11 mEq/L (ref 3–11)
BUN: 11.1 mg/dL (ref 7.0–26.0)
CO2: 26 meq/L (ref 22–29)
Calcium: 9.8 mg/dL (ref 8.4–10.4)
Chloride: 105 mEq/L (ref 98–109)
Creatinine: 0.8 mg/dL (ref 0.6–1.1)
EGFR: 85 mL/min/{1.73_m2} — ABNORMAL LOW (ref >90)
GLUCOSE: 117 mg/dL (ref 70–140)
Potassium: 3.8 mEq/L (ref 3.5–5.1)
Sodium: 142 mEq/L (ref 136–145)
Total Bilirubin: 0.27 mg/dL (ref 0.20–1.20)
Total Protein: 6.9 g/dL (ref 6.4–8.3)

## 2014-07-09 MED ORDER — LORAZEPAM 0.5 MG PO TABS: 0.5000 mg | ORAL_TABLET | Freq: Every evening | ORAL | Status: DC | PRN

## 2014-07-09 MED ORDER — SODIUM CHLORIDE 0.9 % IV SOLN
Freq: Once | INTRAVENOUS | Status: AC
  Administered 2014-07-09: 10:00:00 via INTRAVENOUS

## 2014-07-09 MED ORDER — HEPARIN SOD (PORK) LOCK FLUSH 100 UNIT/ML IV SOLN
500.0000 [IU] | Freq: Once | INTRAVENOUS | Status: AC | PRN
  Administered 2014-07-09: 500 [IU]
  Filled 2014-07-09: qty 5

## 2014-07-09 MED ORDER — SODIUM CHLORIDE 0.9 % IJ SOLN
10.0000 mL | INTRAMUSCULAR | Status: DC | PRN
Start: 2014-07-09 — End: 2014-07-09
  Administered 2014-07-09: 10 mL
  Filled 2014-07-09: qty 10

## 2014-07-09 MED ORDER — ONDANSETRON 8 MG/50ML IVPB (CHCC)
8.0000 mg | Freq: Once | INTRAVENOUS | Status: AC
  Administered 2014-07-09: 8 mg via INTRAVENOUS

## 2014-07-09 MED ORDER — ONDANSETRON 8 MG/NS 50 ML IVPB
INTRAVENOUS | Status: AC
  Filled 2014-07-09: qty 8

## 2014-07-09 MED ORDER — SODIUM CHLORIDE 0.9 % IV SOLN
800.0000 mg/m2 | Freq: Once | INTRAVENOUS | Status: AC
  Administered 2014-07-09: 1824 mg via INTRAVENOUS
  Filled 2014-07-09: qty 47.97

## 2014-07-09 MED ORDER — DEXAMETHASONE SODIUM PHOSPHATE 10 MG/ML IJ SOLN
INTRAMUSCULAR | Status: AC
  Filled 2014-07-09: qty 1

## 2014-07-09 MED ORDER — ONDANSETRON HCL 8 MG PO TABS: ORAL_TABLET | ORAL | Status: DC

## 2014-07-09 MED ORDER — DEXAMETHASONE SODIUM PHOSPHATE 10 MG/ML IJ SOLN
10.0000 mg | Freq: Once | INTRAMUSCULAR | Status: AC
  Administered 2014-07-09: 10 mg via INTRAVENOUS

## 2014-07-09 MED ORDER — SODIUM CHLORIDE 0.9 % IV SOLN
247.8000 mg | Freq: Once | INTRAVENOUS | Status: AC
  Administered 2014-07-09: 250 mg via INTRAVENOUS
  Filled 2014-07-09: qty 25

## 2014-07-09 NOTE — Patient Instructions (Signed)
[facility] Discharge Instructions for Patients Receiving Chemotherapy  Today you received the following chemotherapy agents gemzar/carboplatin  To help prevent nausea and vomiting after your treatment, we encourage you to take your nausea medication as directed   If you develop nausea and vomiting that is not controlled by your nausea medication, call the clinic.   BELOW ARE SYMPTOMS THAT SHOULD BE REPORTED IMMEDIATELY:  *FEVER GREATER THAN 100.5 F  *CHILLS WITH OR WITHOUT FEVER  NAUSEA AND VOMITING THAT IS NOT CONTROLLED WITH YOUR NAUSEA MEDICATION  *UNUSUAL SHORTNESS OF BREATH  *UNUSUAL BRUISING OR BLEEDING  TENDERNESS IN MOUTH AND THROAT WITH OR WITHOUT PRESENCE OF ULCERS  *URINARY PROBLEMS  *BOWEL PROBLEMS  UNUSUAL RASH Items with * indicate a potential emergency and should be followed up as soon as possible.  Feel free to call the clinic you have any questions or concerns. The clinic phone number is [phone].  

## 2014-07-09 NOTE — Telephone Encounter (Signed)
I have adjusted appt 

## 2014-07-09 NOTE — Progress Notes (Signed)
[name]  [phone] Fax:(336) [phone]     ID: TYLEA HISE DOB: 1946-04-03  MR#: [id number]  PPJ#:[id number]  Patient Care Team: Maury Dus, MD as PCP - General (Family Medicine) Excell Seltzer, MD as Consulting Physician (General Surgery) Chauncey Cruel, MD as Consulting Physician (Oncology) Jodelle Gross, MD as Consulting Physician (Radiation Oncology) Maisie Fus, MD as Consulting Physician (Obstetrics and Gynecology)   CHIEF COMPLAINT: Newly diagnosed breast cancer CURRENT TREATMENT:  On adjuvant chemotherapy  BREAST CANCER HISTORY: From the original intake note:  [facility] showing a possible mass in her left breast. On 01/05/2014 left diagnostic mammography and ultrasonography at the breast Center showed 2 adjacent poorly defined masses deep in the lower outer portion of the left breast. On physical exam these were palpable measuring approximately 3 cm altogether, at the 4:00 position 16 cm from the left nipple. There were no palpable left axillary lymph nodes. Ultrasound confirmed to and adjacent irregularly marginated hypoechoic masses in the area in question, measuring 1.5 and 1.0 cm respectively. The total area of by ultrasonography measured 2.6 cm. In addition, to left axillary lymph nodes showed mild cortical thickening in one of them showed loss of the normal fatty hilum.  On 01/10/2014 the patient underwent separate biopsies of both the left breast masses in question as well as the more suspicious lymph node. The final pathology ([id number]) showed the lymph node to be benign. (This was interpreted as concordant). Both of the breast masses, however, were positive for an invasive ductal carcinoma, which was HER-2 not amplified, with the signals ratio of 0.98 and the number per cell being 2.45. Estrogen and progesterone receptor determination is pending but looks negative at this  point.  On 01/16/2014 the patient underwent bilateral breast MRI the right breast was unremarkable. In the left breast lower outer quadrant there were 2 oval masses measuring 1.5 and 2.2 cm. Taken together they was approximately 4 cm of disease. There were no findings of concern in the right breast. In the left axilla there was a single mildly prominent lymph node. There was no suspicious internal mammary adenopathy.  The patient's subsequent history is as detailed below  INTERVAL HISTORY: Lesta returns today for follow up of her breast cancer accompanied by her goddaughter. Today is day 8, cycle 2 of 3 cycles plannedof carboplatin and gemcitabine, both drugs given days 1 and 8 of each 21 day cycle, with Neulasta on day 9. Her opthalmology visit went well. She will continue the prescription eye drops as prescribed. Her vision is much improved. New this week is thigh and buttock pain. At one point the pain was as much as 8/10, and most likely muscle aches, possibly from dancing at church on Wednesday. The pain is improving with time. Her blood pressure is elevated at this visit, because she has not had her BP meds. She took them before our visit started.    REVIEW OF SYSTEMS: Londynn denies fevers, chills, nausea or vomiting. She has some mild constipation but she "knows what to do about that." Her appetite has improved somewhat this past week. She denies mouth sores, rashes, or peripheral neuropathy concerns. Fatigue is her main complaint, especially with this regimen. She denies shortness of breath, chest pain, cough or palpitations. She sleeps well with benadryl. A detailed review of systems is otherwise negative.    PAST MEDICAL HISTORY: Past Medical History  Diagnosis Date  . Allergy codeine  rapid heart beat, cold sweat  . Arthritis   . Breast cancer   . Hypertension     PAST SURGICAL HISTORY: Past Surgical History  Procedure Laterality Date  . Knee arthroscopy  2012    right  .  Lumbar laminectomy  1991  . Colonoscopy    . Foot arthroplasty  1998    right  . Abdominal hysterectomy  1992  . Breast lumpectomy with needle localization and axillary sentinel lymph node bx Left 02/12/2014    Procedure: LEFT BREAST LUMPECTOMY WITH NEEDLE LOCALIZATION AND LEFT AXILLARY SENTINEL LYMPH NODE BX;  Surgeon: [name], MD;  Location: [facility];  Service: General;  Laterality: Left;  . Portacath placement Right 02/12/2014    Procedure: INSERTION PORT-A-CATH;  Surgeon: [name], MD;  Location: [facility];  Service: General;  Laterality: Right;    FAMILY HISTORY Family History  Problem Relation Age of Onset  . Cancer Sister   . Cancer Brother   . Cancer Maternal Aunt   . Cancer Paternal Aunt    the patient's father died at the age of 28 from a stroke. The patient's mother died at the age of 64 from "multiple causes". The patient had 2 brothers and 2 sisters. The patient's sister was diagnosed with uterine cancer at the age of 86, and the patient's brother [name] had "bone cancer" and died in his 50s. He was treated at the  [facility]. There is no history of breast or ovarian cancer in the family to the patient's knowledge.  GYNECOLOGIC HISTORY:  No LMP recorded. Patient has had a hysterectomy. Menarche age 19, the patient is GX P0. She underwent hysterectomy in 1992, with no salpingo-oophorectomy. She has been on estrogen replacement since. 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 Her husband [name] worked for [employer] in [place] as a Facilities manager. The patient's adopted son [name] works as a laborer in [place]. The patient's god-daughter [name] is an Optometrist. The patient has no grandchildren. The patient attends a local church of  [place], and her husband [name] is  pastor   ADVANCED DIRECTIVES: In place   HEALTH MAINTENANCE: History  Substance Use Topics  . Smoking status: Never Smoker   . Smokeless tobacco: Never Used  . Alcohol Use: No     Colonoscopy: October 2008  PAP: July 2015  Bone density: 01/01/2014  Lipid panel:  Allergies  Allergen Reactions  . Codeine Other (See Comments)    Heart fast    Current Outpatient Prescriptions  Medication Sig Dispense Refill  . Alum & Mag Hydroxide-Simeth (MAGIC MOUTHWASH) SOLN Take 5-10 mLs by mouth 4 (four) times daily.    [name] amLODipine (NORVASC) 5 MG tablet Take 5 mg by mouth daily.    . Cholecalciferol (VITAMIN D3) 2000 UNITS TABS Take 1 tablet by mouth daily.    [name] docusate sodium (COLACE) 100 MG capsule Take 100 mg by mouth 2 (two) times daily.    [name] erythromycin ophthalmic ointment Place 1 application into the right eye 2 (two) times daily. 3.5 g 0  . Ginkgo Biloba 120 MG CAPS Take 1 capsule by mouth daily.    [name] lidocaine-prilocaine (EMLA) cream     . potassium chloride (MICRO-K) 10 MEQ CR capsule Take 2 capsules (20 mEq total) by mouth daily. 120 capsule 1  . UNABLE TO FIND Take  3 tablets by mouth 2 (two) times daily. OMEGA XL    . valsartan-hydrochlorothiazide (DIOVAN-HCT) 320-25 MG per tablet Take 1 tablet by mouth daily.    . ciprofloxacin (CIPRO) 500 MG tablet Take 1 tablet (500 mg total) by mouth 2 (two) times daily. (Patient not taking: Reported on 07/02/2014) 10 tablet 0  . dextromethorphan-guaiFENesin (MUCINEX DM) 30-600 MG per 12 hr tablet Take 1 tablet by mouth 2 (two) times daily.    . fluconazole (DIFLUCAN) 150 MG tablet Take 1 tablet (150 mg total) by mouth as directed. (Patient not taking: Reported on 07/02/2014) 2 tablet 0  . HYDROcodone-acetaminophen (NORCO/VICODIN) 5-325 MG per tablet Take 1 tablet by mouth every 4 (four) hours as needed for moderate pain.     [name] ibuprofen (ADVIL,MOTRIN) 200 MG tablet Take 400 mg by mouth daily.    [name] LORazepam (ATIVAN) 0.5 MG tablet Take 1  tablet (0.5 mg total) by mouth at bedtime as needed (Nausea or vomiting). 30 tablet 0  . Melatonin 5 MG TABS Take 1 tablet by mouth at bedtime as needed (sleep).    . nystatin-triamcinolone ointment (MYCOLOG)     . ondansetron (ZOFRAN) 8 MG tablet TAKE ONE TABLET BY MOUTH TWICE DAILY AS NEEDED START  ON  THE  THIRD  DAY  AFTER  CHEMOTHERAPY 30 tablet 2  . oxyCODONE (OXY IR/ROXICODONE) 5 MG immediate release tablet Take 1-2 tablets (5-10 mg total) by mouth every 4 (four) hours as needed for moderate pain, severe pain or breakthrough pain. (Patient not taking: Reported on 07/02/2014) 40 tablet 0  . prochlorperazine (COMPAZINE) 10 MG tablet Take 1 tablet (10 mg total) by mouth every 6 (six) hours as needed (Nausea or vomiting). (Patient not taking: Reported on 07/02/2014) 70 tablet 2  . traZODone (DESYREL) 50 MG tablet Take 1 tablet (50 mg total) by mouth at bedtime. (Patient not taking: Reported on 07/02/2014) 30 tablet 2   No current facility-administered medications for this visit.    OBJECTIVE: Middle-aged [place] American woman in no acute distress  Filed Vitals:   07/09/14 0910  BP: 160/72  Pulse: 95  Temp: 98.6 F (37 C)  Resp: 20     Body mass index is 40.6 kg/(m^2).    ECOG FS:1 - Symptomatic but completely ambulatory  Skin: warm, dry  HEENT: sclerae anicteric, conjunctivae pink, oropharynx clear. No thrush or mucositis.  Lymph Nodes: No cervical or supraclavicular lymphadenopathy  Lungs: clear to auscultation bilaterally, no rales, wheezes, or rhonci  Heart: regular rate and rhythm  Abdomen: round, soft, non tender, positive bowel sounds  Musculoskeletal: No focal spinal tenderness, no peripheral edema  Neuro: non focal, well oriented, positive affect  Breasts: deferred  LAB RESULTS:  CMP     Component Value Date/Time   NA 142 07/09/2014 0853   NA 141 04/18/2014 0444   K 3.8 07/09/2014 0853   K 3.7 04/18/2014 0444   CL 106 04/18/2014 0444   CO2 26 07/09/2014 0853   CO2  24 04/18/2014 0444   GLUCOSE 117 07/09/2014 0853   GLUCOSE 127* 04/18/2014 0444   BUN 11.1 07/09/2014 0853   BUN 6 04/18/2014 0444   CREATININE 0.8 07/09/2014 0853   CREATININE 0.66 04/18/2014 0444   CALCIUM 9.8 07/09/2014 0853   CALCIUM 8.7 04/18/2014 0444   PROT 6.9 07/09/2014 0853   PROT 5.8* 04/16/2014 0515   ALBUMIN 3.6 07/09/2014 0853   ALBUMIN 2.6* 04/16/2014 0515   AST 39* 07/09/2014 0853   AST 12 04/16/2014 0515  ALT 56* 07/09/2014 0853   ALT 15 04/16/2014 0515   ALKPHOS 85 07/09/2014 0853   ALKPHOS 90 04/16/2014 0515   BILITOT 0.27 07/09/2014 0853   BILITOT 0.5 04/16/2014 0515   GFRNONAA >90 04/18/2014 0444   GFRAA >90 04/18/2014 0444    I No results found for: SPEP  Lab Results  Component Value Date   WBC 6.8 07/09/2014   NEUTROABS 4.7 07/09/2014   HGB 10.0* 07/09/2014   HCT 30.8* 07/09/2014   MCV 91.1 07/09/2014   PLT 553* 07/09/2014      Chemistry      Component Value Date/Time   NA 142 07/09/2014 0853   NA 141 04/18/2014 0444   K 3.8 07/09/2014 0853   K 3.7 04/18/2014 0444   CL 106 04/18/2014 0444   CO2 26 07/09/2014 0853   CO2 24 04/18/2014 0444   BUN 11.1 07/09/2014 0853   BUN 6 04/18/2014 0444   CREATININE 0.8 07/09/2014 0853   CREATININE 0.66 04/18/2014 0444      Component Value Date/Time   CALCIUM 9.8 07/09/2014 0853   CALCIUM 8.7 04/18/2014 0444   ALKPHOS 85 07/09/2014 0853   ALKPHOS 90 04/16/2014 0515   AST 39* 07/09/2014 0853   AST 12 04/16/2014 0515   ALT 56* 07/09/2014 0853   ALT 15 04/16/2014 0515   BILITOT 0.27 07/09/2014 0853   BILITOT 0.5 04/16/2014 0515       No results found for: LABCA2  No components found for: PVVZS827  No results for input(s): INR in the last 168 hours.  Urinalysis    Component Value Date/Time   COLORURINE YELLOW 04/15/2014 1814    STUDIES: Most recent echocardiogram on 04/03/14 showed a well preserved ejection fraction of 60-65%    ASSESSMENT: 69 y.o.  woman status post  left breast biopsy of 2 separate lower outer quadrant masses 01/10/2014 for a multifocal cT2 cNX, stage II invasive ductal carcinoma, grade 3, with an MIB-1 of 90%, HER-2 negative, estrogen receptors 3% positive with weak staining, progesterone receptor negative, with an MIB-1 of 90%; this is clinically a "triple-negative" breast cancer and will be treated as such  (1) a suspicious left axillary lymph node biopsied 01/10/2014 was negative (concordant)  (2) left lumpectomy and sentinel lymph node sampling 02/12/2014 showed an mpT2 pN0, stage IIA invasive ductal carcinoma grade 3, repeat HER-2 again negative  (3) adjuvant chemotherapy consisted of  (a) dose dense doxorubicin and cyclophosphamide x4 completed 05/02/2014  (b) weekly paclitaxel and carboplatin with 3 of 12 doses given, then stopped because of neuropathy  (c) Carboplatin and Gemzar started 06/11/2014, given on day 1 & day 8 of a 21 days cycle for 3 planned cycles. Neulasta given on day 9 of each cycle.   (4) adjuvant radiation to follow chemotherapy  PLAN:  Brie continues to manage this chemo regimen well. The labs were reviewed in detail and were stable. She will proceed with day 8, cycle 2 of carboplatin and gemcitabine today as scheduled.  [name] will continue her steroid drops as advised by her ophthalmologist.   Estill Bamberg will return in 2 weeks for the start of cycle 3. She understands and agrees with this plan. She knows the goal of treatment in her case is cure. She has been encouraged to call with any issues that might arise before her next visit here.   Marcelino Duster, [place] [phone] 07/09/2014 9:31 AM

## 2014-07-09 NOTE — Addendum Note (Signed)
Addended by: Rea College D on: 07/09/2014 10:07 AM   Modules accepted: Orders, Medications

## 2014-07-10 ENCOUNTER — Ambulatory Visit (HOSPITAL_BASED_OUTPATIENT_CLINIC_OR_DEPARTMENT_OTHER): Payer: BLUE CROSS/BLUE SHIELD

## 2014-07-10 ENCOUNTER — Telehealth: Payer: Self-pay | Admitting: Nurse Practitioner

## 2014-07-10 DIAGNOSIS — C50512 Malignant neoplasm of lower-outer quadrant of left female breast: Secondary | ICD-10-CM

## 2014-07-10 DIAGNOSIS — Z5189 Encounter for other specified aftercare: Secondary | ICD-10-CM

## 2014-07-10 MED ORDER — PEGFILGRASTIM INJECTION 6 MG/0.6ML
6.0000 mg | Freq: Once | SUBCUTANEOUS | Status: AC
  Administered 2014-07-10: 6 mg via SUBCUTANEOUS
  Filled 2014-07-10: qty 0.6

## 2014-07-10 NOTE — Telephone Encounter (Signed)
per pof to -move appt to earlier appt-sch-Shameeka moved trmt-cld & adv pt of updated appt time & date-pt understood

## 2014-07-16 ENCOUNTER — Ambulatory Visit: Payer: MEDICARE | Admitting: Nurse Practitioner

## 2014-07-16 ENCOUNTER — Ambulatory Visit: Payer: MEDICARE

## 2014-07-16 ENCOUNTER — Other Ambulatory Visit: Payer: MEDICARE

## 2014-07-23 ENCOUNTER — Ambulatory Visit (HOSPITAL_BASED_OUTPATIENT_CLINIC_OR_DEPARTMENT_OTHER): Payer: BLUE CROSS/BLUE SHIELD

## 2014-07-23 ENCOUNTER — Ambulatory Visit (HOSPITAL_BASED_OUTPATIENT_CLINIC_OR_DEPARTMENT_OTHER): Payer: BLUE CROSS/BLUE SHIELD | Admitting: Oncology

## 2014-07-23 ENCOUNTER — Encounter: Payer: Self-pay | Admitting: Oncology

## 2014-07-23 ENCOUNTER — Other Ambulatory Visit (HOSPITAL_BASED_OUTPATIENT_CLINIC_OR_DEPARTMENT_OTHER): Payer: BLUE CROSS/BLUE SHIELD

## 2014-07-23 VITALS — BP 140/65 | HR 85 | Temp 98.3°F | Resp 18 | Ht 66.0 in | Wt 250.5 lb

## 2014-07-23 DIAGNOSIS — C50512 Malignant neoplasm of lower-outer quadrant of left female breast: Secondary | ICD-10-CM

## 2014-07-23 DIAGNOSIS — Z5111 Encounter for antineoplastic chemotherapy: Secondary | ICD-10-CM

## 2014-07-23 DIAGNOSIS — Z170 Estrogen receptor positive status [ER+]: Secondary | ICD-10-CM

## 2014-07-23 LAB — COMPREHENSIVE METABOLIC PANEL (CC13)
ALBUMIN: 3.7 g/dL (ref 3.5–5.0)
ALK PHOS: 106 U/L (ref 40–150)
ALT: 45 U/L (ref 0–55)
AST: 33 U/L (ref 5–34)
Anion Gap: 11 mEq/L (ref 3–11)
BUN: 16.7 mg/dL (ref 7.0–26.0)
CO2: 25 mEq/L (ref 22–29)
Calcium: 9.7 mg/dL (ref 8.4–10.4)
Chloride: 104 mEq/L (ref 98–109)
Creatinine: 1.1 mg/dL (ref 0.6–1.1)
EGFR: 63 mL/min/{1.73_m2} — AB (ref >90)
GLUCOSE: 119 mg/dL (ref 70–140)
Potassium: 3.6 mEq/L (ref 3.5–5.1)
Sodium: 140 mEq/L (ref 136–145)
Total Bilirubin: 0.42 mg/dL (ref 0.20–1.20)
Total Protein: 7.1 g/dL (ref 6.4–8.3)

## 2014-07-23 LAB — CBC WITH DIFFERENTIAL/PLATELET
BASO%: 0.7 % (ref 0.0–2.0)
BASOS ABS: 0.1 10*3/uL (ref 0.0–0.1)
EOS%: 1.1 % (ref 0.0–7.0)
Eosinophils Absolute: 0.1 10*3/uL (ref 0.0–0.5)
HEMATOCRIT: 32.4 % — AB (ref 34.8–46.6)
HEMOGLOBIN: 10.4 g/dL — AB (ref 11.6–15.9)
LYMPH%: 14.6 % (ref 14.0–49.7)
MCH: 29.9 pg (ref 25.1–34.0)
MCHC: 32.1 g/dL (ref 31.5–36.0)
MCV: 93.1 fL (ref 79.5–101.0)
MONO#: 0.9 10*3/uL (ref 0.1–0.9)
MONO%: 11.7 % (ref 0.0–14.0)
NEUT#: 5.3 10*3/uL (ref 1.5–6.5)
NEUT%: 71.9 % (ref 38.4–76.8)
Platelets: 264 10*3/uL (ref 145–400)
RBC: 3.48 10*6/uL — AB (ref 3.70–5.45)
RDW: 21.6 % — ABNORMAL HIGH (ref 11.2–14.5)
WBC: 7.3 10*3/uL (ref 3.9–10.3)
lymph#: 1.1 10*3/uL (ref 0.9–3.3)
nRBC: 1 % — ABNORMAL HIGH (ref 0–0)

## 2014-07-23 MED ORDER — DEXAMETHASONE SODIUM PHOSPHATE 10 MG/ML IJ SOLN
INTRAMUSCULAR | Status: AC
  Filled 2014-07-23: qty 1

## 2014-07-23 MED ORDER — SODIUM CHLORIDE 0.9 % IV SOLN
800.0000 mg/m2 | Freq: Once | INTRAVENOUS | Status: AC
  Administered 2014-07-23: 1824 mg via INTRAVENOUS
  Filled 2014-07-23: qty 47.97

## 2014-07-23 MED ORDER — ONDANSETRON 8 MG/50ML IVPB (CHCC)
8.0000 mg | Freq: Once | INTRAVENOUS | Status: AC
  Administered 2014-07-23: 8 mg via INTRAVENOUS

## 2014-07-23 MED ORDER — DEXAMETHASONE SODIUM PHOSPHATE 10 MG/ML IJ SOLN
10.0000 mg | Freq: Once | INTRAMUSCULAR | Status: AC
  Administered 2014-07-23: 10 mg via INTRAVENOUS

## 2014-07-23 MED ORDER — SODIUM CHLORIDE 0.9 % IV SOLN
Freq: Once | INTRAVENOUS | Status: AC
  Administered 2014-07-23: 10:00:00 via INTRAVENOUS

## 2014-07-23 MED ORDER — SODIUM CHLORIDE 0.9 % IJ SOLN
10.0000 mL | INTRAMUSCULAR | Status: DC | PRN
  Administered 2014-07-23: 10 mL
  Filled 2014-07-23: qty 10

## 2014-07-23 MED ORDER — SODIUM CHLORIDE 0.9 % IV SOLN
230.0000 mg | Freq: Once | INTRAVENOUS | Status: AC
  Administered 2014-07-23: 230 mg via INTRAVENOUS
  Filled 2014-07-23: qty 23

## 2014-07-23 MED ORDER — ONDANSETRON 8 MG/NS 50 ML IVPB
INTRAVENOUS | Status: AC
  Filled 2014-07-23: qty 8

## 2014-07-23 MED ORDER — HEPARIN SOD (PORK) LOCK FLUSH 100 UNIT/ML IV SOLN
500.0000 [IU] | Freq: Once | INTRAVENOUS | Status: AC | PRN
  Administered 2014-07-23: 500 [IU]
  Filled 2014-07-23: qty 5

## 2014-07-23 NOTE — Patient Instructions (Signed)
[facility] Discharge Instructions for Patients Receiving Chemotherapy  Today you received the following chemotherapy agents:  Carboplatin and Gemzar  To help prevent nausea and vomiting after your treatment, we encourage you to take your nausea medication as ordered per MD.   If you develop nausea and vomiting that is not controlled by your nausea medication, call the clinic.   BELOW ARE SYMPTOMS THAT SHOULD BE REPORTED IMMEDIATELY:  *FEVER GREATER THAN 100.5 F  *CHILLS WITH OR WITHOUT FEVER  NAUSEA AND VOMITING THAT IS NOT CONTROLLED WITH YOUR NAUSEA MEDICATION  *UNUSUAL SHORTNESS OF BREATH  *UNUSUAL BRUISING OR BLEEDING  TENDERNESS IN MOUTH AND THROAT WITH OR WITHOUT PRESENCE OF ULCERS  *URINARY PROBLEMS  *BOWEL PROBLEMS  UNUSUAL RASH Items with * indicate a potential emergency and should be followed up as soon as possible.  Feel free to call the clinic you have any questions or concerns. The clinic phone number is [phone].

## 2014-07-23 NOTE — Progress Notes (Signed)
Monica [name]  Telephone:([phone]) [phone] Fax:(336) [phone]     ID: Monica [name] DOB: 1945/07/30  MR#: [id number]  PZW#:[id number]  Patient Care Team: Maury Dus, MD as PCP - General (Family Medicine) Excell Seltzer, MD as Consulting Physician (General Surgery) Chauncey Cruel, MD as Consulting Physician (Oncology) Jodelle Gross, MD as Consulting Physician (Radiation Oncology) Maisie Fus, MD as Consulting Physician (Obstetrics and Gynecology)   CHIEF COMPLAINT: Newly diagnosed breast cancer CURRENT TREATMENT:  On adjuvant chemotherapy  BREAST CANCER HISTORY: From the original intake note:  [name] showing a possible mass in her left breast. On 01/05/2014 left diagnostic mammography and ultrasonography at the breast Center showed 2 adjacent poorly defined masses deep in the lower outer portion of the left breast. On physical exam these were palpable measuring approximately 3 cm altogether, at the 4:00 position 16 cm from the left nipple. There were no palpable left axillary lymph nodes. Ultrasound confirmed to and adjacent irregularly marginated hypoechoic masses in the area in question, measuring 1.5 and 1.0 cm respectively. The total area of by ultrasonography measured 2.6 cm. In addition, to left axillary lymph nodes showed mild cortical thickening in one of them showed loss of the normal fatty hilum.  On 01/10/2014 the patient underwent separate biopsies of both the left breast masses in question as well as the more suspicious lymph node. The final pathology ([id number]) showed the lymph node to be benign. (This was interpreted as concordant). Both of the breast masses, however, were positive for an invasive ductal carcinoma, which was HER-2 not amplified, with the signals ratio of 0.98 and the number per cell being 2.45. Estrogen and progesterone receptor determination is pending but looks negative at this  point.  On 01/16/2014 the patient underwent bilateral breast MRI the right breast was unremarkable. In the left breast lower outer quadrant there were 2 oval masses measuring 1.5 and 2.2 cm. Taken together they was approximately 4 cm of disease. There were no findings of concern in the right breast. In the left axilla there was a single mildly prominent lymph node. There was no suspicious internal mammary adenopathy.  The patient's subsequent history is as detailed below  INTERVAL HISTORY: Monica [name] returns today for follow up of her breast cancer accompanied by her goddaughter. Today is day 1, cycle 3 of 3 cycles plannedof carboplatin and gemcitabine, both drugs given days 1 and 8 of each 21 day cycle, with Neulasta on day 9.    REVIEW OF SYSTEMS: Mayre denies fevers, chills, nausea or vomiting. She has some mild constipation but takes stool softeners and laxatives as needed. Her appetite has improved somewhat this past week. She denies mouth sores, rashes, or peripheral neuropathy concerns. Fatigue is her main complaint, especially with this regimen. She denies shortness of breath, chest pain, cough or palpitations. She sleeps well with benadryl. A detailed review of systems is otherwise negative.    PAST MEDICAL HISTORY: Past Medical History  Diagnosis Date  . Allergy codeine    rapid heart beat, cold sweat  . Arthritis   . Breast cancer   . Hypertension     PAST SURGICAL HISTORY: Past Surgical History  Procedure Laterality Date  . Knee arthroscopy  2012    right  . Lumbar laminectomy  1991  . Colonoscopy    . Foot arthroplasty  1998    right  . Abdominal hysterectomy  1992  . Breast lumpectomy with needle localization  and axillary sentinel lymph node bx Left 02/12/2014    Procedure: LEFT BREAST LUMPECTOMY WITH NEEDLE LOCALIZATION AND LEFT AXILLARY SENTINEL LYMPH NODE BX;  Surgeon: [name], MD;  Location: [facility];  Service: General;  Laterality: Left;    . Portacath placement Right 02/12/2014    Procedure: INSERTION PORT-A-CATH;  Surgeon: [name], MD;  Location: [facility];  Service: General;  Laterality: Right;    FAMILY HISTORY Family History  Problem Relation Age of Onset  . Cancer Sister   . Cancer Brother   . Cancer Maternal Aunt   . Cancer Paternal Aunt    the patient's father died at the age of 69 from a stroke. The patient's mother died at the age of 64 from "multiple causes". The patient had 2 brothers and 2 sisters. The patient's sister was diagnosed with uterine cancer at the age of 4, and the patient's brother [name] had "bone cancer" and died in his 3s. He was treated at the [facility]. There is no history of breast or ovarian cancer in the family to the patient's knowledge.  GYNECOLOGIC HISTORY:  No LMP recorded. Patient has had a hysterectomy. Menarche age 87, the patient is GX P0. She underwent hysterectomy in 1992, with no salpingo-oophorectomy. She has been on estrogen replacement since. 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 Her husband [name] worked for [employer] in [place] as a Facilities manager. The patient's adopted son Monica [name] "Monica [name]" [name] works as a laborer in [place]. The patient's god-daughter Monica [name] is an Optometrist. The patient has no grandchildren. The patient attends a local church of  [place], and her husband [name] is pastor   ADVANCED DIRECTIVES: In place   HEALTH MAINTENANCE: History  Substance Use Topics  . Smoking status: Never Smoker   . Smokeless tobacco: Never Used  . Alcohol Use: No     Colonoscopy: October 2008  PAP: July 2015  Bone density: 01/01/2014  Lipid panel:  Allergies  Allergen Reactions  . Codeine Other (See Comments)    Heart fast    Current Outpatient Prescriptions  Medication Sig Dispense Refill  . Alum &  Mag Hydroxide-Simeth (MAGIC MOUTHWASH) SOLN Take 5-10 mLs by mouth 4 (four) times daily.    [name] amLODipine (NORVASC) 5 MG tablet Take 5 mg by mouth daily.    . Cholecalciferol (VITAMIN D3) 2000 UNITS TABS Take 1 tablet by mouth daily.    [name] dextromethorphan-guaiFENesin (MUCINEX DM) 30-600 MG per 12 hr tablet Take 1 tablet by mouth 2 (two) times daily.    [name] docusate sodium (COLACE) 100 MG capsule Take 100 mg by mouth 2 (two) times daily.    . fluconazole (DIFLUCAN) 150 MG tablet Take 1 tablet (150 mg total) by mouth as directed. 2 tablet 0  . Ginkgo Biloba 120 MG CAPS Take 1 capsule by mouth daily.    [name] HYDROcodone-acetaminophen (NORCO/VICODIN) 5-325 MG per tablet Take 1 tablet by mouth every 4 (four) hours as needed for moderate pain.     [name] ibuprofen (ADVIL,MOTRIN) 200 MG tablet Take 400 mg by mouth daily.    [name] lidocaine-prilocaine (EMLA) cream     . LORazepam (ATIVAN) 0.5 MG tablet Take 1 tablet (0.5 mg total) by mouth at bedtime as needed (Nausea or vomiting). 30 tablet 0  . Melatonin 5 MG TABS Take 1 tablet by mouth at bedtime  as needed (sleep).    . nystatin-triamcinolone ointment (MYCOLOG)     . ondansetron (ZOFRAN) 8 MG tablet TAKE ONE TABLET BY MOUTH TWICE DAILY AS NEEDED START  ON  THE  THIRD  DAY  AFTER  CHEMOTHERAPY 30 tablet 2  . oxyCODONE (OXY IR/ROXICODONE) 5 MG immediate release tablet Take 1-2 tablets (5-10 mg total) by mouth every 4 (four) hours as needed for moderate pain, severe pain or breakthrough pain. 40 tablet 0  . potassium chloride (MICRO-K) 10 MEQ CR capsule Take 2 capsules (20 mEq total) by mouth daily. 120 capsule 1  . prochlorperazine (COMPAZINE) 10 MG tablet Take 1 tablet (10 mg total) by mouth every 6 (six) hours as needed (Nausea or vomiting). 70 tablet 2  . traZODone (DESYREL) 50 MG tablet Take 1 tablet (50 mg total) by mouth at bedtime. 30 tablet 2  . UNABLE TO FIND Take 3 tablets by mouth 2 (two) times daily. OMEGA XL    . valsartan-hydrochlorothiazide (DIOVAN-HCT)  320-25 MG per tablet Take 1 tablet by mouth daily.     No current facility-administered medications for this visit.   Facility-Administered Medications Ordered in Other Visits  Medication Dose Route Frequency Provider Last Rate Last Dose  . sodium chloride 0.9 % injection 10 mL  10 mL Intracatheter PRN Chauncey Cruel, MD   10 mL at 07/23/14 1213    OBJECTIVE: Middle-aged [place] American woman in no acute distress  Filed Vitals:   07/23/14 0935  BP: 140/65  Pulse: 85  Temp: 98.3 F (36.8 C)  Resp: 18     Body mass index is 40.45 kg/(m^2).    ECOG FS:1 - Symptomatic but completely ambulatory  Skin: warm, dry  HEENT: sclerae anicteric, conjunctivae pink, oropharynx clear. No thrush or mucositis.  Lymph Nodes: No cervical or supraclavicular lymphadenopathy  Lungs: clear to auscultation bilaterally, no rales, wheezes, or rhonci  Heart: regular rate and rhythm  Abdomen: round, soft, non tender, positive bowel sounds  Musculoskeletal: No focal spinal tenderness, no peripheral edema  Neuro: non focal, well oriented, positive affect  Breasts: deferred  LAB RESULTS:  CMP     Component Value Date/Time   NA 140 07/23/2014 0854   NA 141 04/18/2014 0444   K 3.6 07/23/2014 0854   K 3.7 04/18/2014 0444   CL 106 04/18/2014 0444   CO2 25 07/23/2014 0854   CO2 24 04/18/2014 0444   GLUCOSE 119 07/23/2014 0854   GLUCOSE 127* 04/18/2014 0444   BUN 16.7 07/23/2014 0854   BUN 6 04/18/2014 0444   CREATININE 1.1 07/23/2014 0854   CREATININE 0.66 04/18/2014 0444   CALCIUM 9.7 07/23/2014 0854   CALCIUM 8.7 04/18/2014 0444   PROT 7.1 07/23/2014 0854   PROT 5.8* 04/16/2014 0515   ALBUMIN 3.7 07/23/2014 0854   ALBUMIN 2.6* 04/16/2014 0515   AST 33 07/23/2014 0854   AST 12 04/16/2014 0515   ALT 45 07/23/2014 0854   ALT 15 04/16/2014 0515   ALKPHOS 106 07/23/2014 0854   ALKPHOS 90 04/16/2014 0515   BILITOT 0.42 07/23/2014 0854   BILITOT 0.5 04/16/2014 0515   GFRNONAA >90 04/18/2014  0444   GFRAA >90 04/18/2014 0444    I No results found for: SPEP  Lab Results  Component Value Date   WBC 7.3 07/23/2014   NEUTROABS 5.3 07/23/2014   HGB 10.4* 07/23/2014   HCT 32.4* 07/23/2014   MCV 93.1 07/23/2014   PLT 264 07/23/2014      Chemistry  Component Value Date/Time   NA 140 07/23/2014 0854   NA 141 04/18/2014 0444   K 3.6 07/23/2014 0854   K 3.7 04/18/2014 0444   CL 106 04/18/2014 0444   CO2 25 07/23/2014 0854   CO2 24 04/18/2014 0444   BUN 16.7 07/23/2014 0854   BUN 6 04/18/2014 0444   CREATININE 1.1 07/23/2014 0854   CREATININE 0.66 04/18/2014 0444      Component Value Date/Time   CALCIUM 9.7 07/23/2014 0854   CALCIUM 8.7 04/18/2014 0444   ALKPHOS 106 07/23/2014 0854   ALKPHOS 90 04/16/2014 0515   AST 33 07/23/2014 0854   AST 12 04/16/2014 0515   ALT 45 07/23/2014 0854   ALT 15 04/16/2014 0515   BILITOT 0.42 07/23/2014 0854   BILITOT 0.5 04/16/2014 0515       No results found for: LABCA2  No components found for: LABCA125  No results for input(s): INR in the last 168 hours.  Urinalysis    Component Value Date/Time   COLORURINE YELLOW 04/15/2014 1814    STUDIES: Most recent echocardiogram on 04/03/14 showed a well preserved ejection fraction of 60-65%    ASSESSMENT: 69 y.o. [place] woman status post left breast biopsy of 2 separate lower outer quadrant masses 01/10/2014 for a multifocal cT2 cNX, stage II invasive ductal carcinoma, grade 3, with an MIB-1 of 90%, HER-2 negative, estrogen receptors 3% positive with weak staining, progesterone receptor negative, with an MIB-1 of 90%; this is clinically a "triple-negative" breast cancer and will be treated as such  (1) a suspicious left axillary lymph node biopsied 01/10/2014 was negative (concordant)  (2) left lumpectomy and sentinel lymph node sampling 02/12/2014 showed an mpT2 pN0, stage IIA invasive ductal carcinoma grade 3, repeat HER-2 again negative  (3) adjuvant chemotherapy  consisted of  (a) dose dense doxorubicin and cyclophosphamide x4 completed 05/02/2014  (b) weekly paclitaxel and carboplatin with 3 of 12 doses given, then stopped because of neuropathy  (c) Carboplatin and Gemzar started 06/11/2014, given on day 1 & day 8 of a 21 days cycle for 3 planned cycles. Neulasta given on day 9 of each cycle.   (4) adjuvant radiation to follow chemotherapy  PLAN:  Patrici continues to tolerate this chemo regimen well. The labs were reviewed in detail and were stable. She will proceed with day 1, cycle 3 of carboplatin and gemcitabine today as scheduled.   Kamrie will return in 1 week for cycle 3 day 8. She understands and agrees with this plan. She knows the goal of treatment in her case is cure. She has been encouraged to call with any issues that might arise before her next visit here.   Mikey Bussing, [place] [phone] 07/23/2014 12:52 PM

## 2014-07-30 ENCOUNTER — Ambulatory Visit: Payer: MEDICARE | Admitting: Nurse Practitioner

## 2014-07-30 ENCOUNTER — Other Ambulatory Visit (HOSPITAL_BASED_OUTPATIENT_CLINIC_OR_DEPARTMENT_OTHER): Payer: BLUE CROSS/BLUE SHIELD

## 2014-07-30 ENCOUNTER — Ambulatory Visit (HOSPITAL_BASED_OUTPATIENT_CLINIC_OR_DEPARTMENT_OTHER): Payer: BLUE CROSS/BLUE SHIELD | Admitting: Nurse Practitioner

## 2014-07-30 ENCOUNTER — Ambulatory Visit (HOSPITAL_BASED_OUTPATIENT_CLINIC_OR_DEPARTMENT_OTHER): Payer: BLUE CROSS/BLUE SHIELD

## 2014-07-30 ENCOUNTER — Telehealth: Payer: Self-pay | Admitting: Oncology

## 2014-07-30 ENCOUNTER — Encounter: Payer: Self-pay | Admitting: Nurse Practitioner

## 2014-07-30 ENCOUNTER — Encounter: Payer: Self-pay | Admitting: Radiation Oncology

## 2014-07-30 ENCOUNTER — Ambulatory Visit: Payer: BLUE CROSS/BLUE SHIELD

## 2014-07-30 VITALS — BP 141/73 | HR 87 | Temp 98.0°F | Resp 22 | Wt 254.2 lb

## 2014-07-30 DIAGNOSIS — C50212 Malignant neoplasm of upper-inner quadrant of left female breast: Secondary | ICD-10-CM

## 2014-07-30 DIAGNOSIS — C50512 Malignant neoplasm of lower-outer quadrant of left female breast: Secondary | ICD-10-CM

## 2014-07-30 DIAGNOSIS — T451X5A Adverse effect of antineoplastic and immunosuppressive drugs, initial encounter: Secondary | ICD-10-CM

## 2014-07-30 DIAGNOSIS — Z5111 Encounter for antineoplastic chemotherapy: Secondary | ICD-10-CM

## 2014-07-30 DIAGNOSIS — D6481 Anemia due to antineoplastic chemotherapy: Secondary | ICD-10-CM

## 2014-07-30 LAB — COMPREHENSIVE METABOLIC PANEL (CC13)
ALT: 53 U/L (ref 0–55)
AST: 39 U/L — ABNORMAL HIGH (ref 5–34)
Albumin: 3.5 g/dL (ref 3.5–5.0)
Alkaline Phosphatase: 87 U/L (ref 40–150)
Anion Gap: 11 mEq/L (ref 3–11)
BUN: 13.6 mg/dL (ref 7.0–26.0)
CALCIUM: 9.6 mg/dL (ref 8.4–10.4)
CO2: 26 meq/L (ref 22–29)
Chloride: 104 mEq/L (ref 98–109)
Creatinine: 0.9 mg/dL (ref 0.6–1.1)
EGFR: 81 mL/min/{1.73_m2} — AB (ref >90)
Glucose: 151 mg/dl — ABNORMAL HIGH (ref 70–140)
Potassium: 3.8 mEq/L (ref 3.5–5.1)
SODIUM: 141 meq/L (ref 136–145)
TOTAL PROTEIN: 6.7 g/dL (ref 6.4–8.3)
Total Bilirubin: 0.29 mg/dL (ref 0.20–1.20)

## 2014-07-30 LAB — CBC WITH DIFFERENTIAL/PLATELET
BASO%: 2.3 % — AB (ref 0.0–2.0)
BASOS ABS: 0.1 10*3/uL (ref 0.0–0.1)
EOS%: 3.3 % (ref 0.0–7.0)
Eosinophils Absolute: 0.1 10*3/uL (ref 0.0–0.5)
HEMATOCRIT: 29 % — AB (ref 34.8–46.6)
HGB: 9.2 g/dL — ABNORMAL LOW (ref 11.6–15.9)
LYMPH%: 16.8 % (ref 14.0–49.7)
MCH: 29.9 pg (ref 25.1–34.0)
MCHC: 31.6 g/dL (ref 31.5–36.0)
MCV: 94.4 fL (ref 79.5–101.0)
MONO#: 0.6 10*3/uL (ref 0.1–0.9)
MONO%: 19.6 % — AB (ref 0.0–14.0)
NEUT%: 58 % (ref 38.4–76.8)
NEUTROS ABS: 1.9 10*3/uL (ref 1.5–6.5)
PLATELETS: 290 10*3/uL (ref 145–400)
RBC: 3.07 10*6/uL — ABNORMAL LOW (ref 3.70–5.45)
RDW: 22.2 % — AB (ref 11.2–14.5)
WBC: 3.2 10*3/uL — ABNORMAL LOW (ref 3.9–10.3)
lymph#: 0.5 10*3/uL — ABNORMAL LOW (ref 0.9–3.3)

## 2014-07-30 MED ORDER — SODIUM CHLORIDE 0.9 % IV SOLN
Freq: Once | INTRAVENOUS | Status: AC
  Administered 2014-07-30: 13:00:00 via INTRAVENOUS

## 2014-07-30 MED ORDER — SODIUM CHLORIDE 0.9 % IJ SOLN
10.0000 mL | INTRAMUSCULAR | Status: DC | PRN
  Administered 2014-07-30: 10 mL
  Filled 2014-07-30: qty 10

## 2014-07-30 MED ORDER — SODIUM CHLORIDE 0.9 % IV SOLN
800.0000 mg/m2 | Freq: Once | INTRAVENOUS | Status: AC
  Administered 2014-07-30: 1824 mg via INTRAVENOUS
  Filled 2014-07-30: qty 47.97

## 2014-07-30 MED ORDER — DEXAMETHASONE SODIUM PHOSPHATE 10 MG/ML IJ SOLN
INTRAMUSCULAR | Status: AC
  Filled 2014-07-30: qty 1

## 2014-07-30 MED ORDER — CARBOPLATIN CHEMO INJECTION 450 MG/45ML
247.8000 mg | Freq: Once | INTRAVENOUS | Status: AC
  Administered 2014-07-30: 250 mg via INTRAVENOUS
  Filled 2014-07-30: qty 25

## 2014-07-30 MED ORDER — HEPARIN SOD (PORK) LOCK FLUSH 100 UNIT/ML IV SOLN
500.0000 [IU] | Freq: Once | INTRAVENOUS | Status: AC | PRN
  Administered 2014-07-30: 500 [IU]
  Filled 2014-07-30: qty 5

## 2014-07-30 MED ORDER — ONDANSETRON 8 MG/NS 50 ML IVPB
INTRAVENOUS | Status: AC
Start: 2014-07-30 — End: 2014-07-30
  Filled 2014-07-30: qty 8

## 2014-07-30 MED ORDER — DEXAMETHASONE SODIUM PHOSPHATE 10 MG/ML IJ SOLN
10.0000 mg | Freq: Once | INTRAMUSCULAR | Status: AC
  Administered 2014-07-30: 10 mg via INTRAVENOUS

## 2014-07-30 MED ORDER — ONDANSETRON 8 MG/50ML IVPB (CHCC)
8.0000 mg | Freq: Once | INTRAVENOUS | Status: AC
  Administered 2014-07-30: 8 mg via INTRAVENOUS

## 2014-07-30 NOTE — Progress Notes (Addendum)
Location of Breast Cancer:Left BreastT Lower Outer   Histology per Pathology Report: Diagnosis 01/10/14: 1. Breast, left, needle core biopsy, 4 o'clock 16 cm fn- INVASIVE DUCTAL CARCINOMA.- SEE COMMENT.2. Breast, left, needle core biopsy, 4 o'clock 20 cm fn- INVASIVE DUCTAL CARCINOMA. - SEE COMMENT.3. Lymph node, needle/core biopsy, left axilla- ONE BENIGN LYMPH NODE WITH NO TUMOR SEEN (0/1).  Receptor Status: ER( neg.  ), PR ( neg.  ), Her2-neu ( neg.  )  Did patient present with symptoms (if so, please note symptoms) or was this found on screening mammography?: screening 01/01/14, 01/05/14 keft diagnostic mammography and U/S =2 adjacent poorly defined masses deep in lower outer portion of left breast   Past/Anticipated interventions by surgeon, if any:  Diagnosis 02/12/14: 1. Breast, lumpectomy, Left- INVASIVE DUCTAL CARCINOMA, TWO TUMOR NODULES, 1.2 AND 2.2 CM.- MARGINS NOT INVOLVED.2. Breast, excision, Left further lateral margin- BENIGN FIBROFATTY BREAST TISSUE. - NO EVIDENCE OF MALIGNANCY.3. Breast, excision, Left shade posterior margin- BENIGN FIBROFATTY BREAST TISSUE.- NO EVIDENCE OF MALIGNANCY.4. Lymph node, sentinel, biopsy, Left axillary #1 - ONE BENIGN LYMPH NODE (0/1).5. Lymph node, sentinel, biopsy, Left axillary #2- ONE BENIGN LYMPH NODE (0/1).6. Lymph node, sentinel, biopsy, Left axillary tissue- BENIGN FIBROADIPOSE TISSUE. - NO LYMPH NODE TISSUE OR EVIDENCE OF MALIGNANCY. Dr. [name] T. Hoxworth, MD.  Past/Anticipated interventions by medical oncology, if any: Chemotherapy Hether Ferrell,NP 07/30/14, Today is day 8, cycle 3 of 3 planned cycles of carboplatin and gemcitabine, both drugs given days 1 and 8 of each 21 day cycle, with Neulasta on day 9., , f/u Dr. Jana Hakim 09/12/14 completed chemotherapy this past Monday, 07/29/14  Lymphedema issues, if any: None  Pain issues, if any:   B/L knees,  Needs knee replacements,  SAFETY ISSUES:  Prior radiation? NO  Pacemaker/ICD?  NO  Possible current pregnancy? NO  Is the patient on methotrexate?  NO  Current Complaints / other details: ,Married,  Menarche age 53, GXPO, adopted children,  Hysterectomy 1992, ovaries intact, on estrogen replacement since advised to d/c, , never cigarette or smokeless tobacco user, no alcohol or illicit drug use,  Father deceased stroke age 31,mother deceased age 42 multiple causes, Sister uterine cancer age 13, 1 brother Bone cancer in his 46's, no Hx of breast or ovarian cancer in the family    Allergies: Codeine=Rapid heart beat,cold sweats    Christia Domke, Felicita Gage, RN 07/30/2014,2:32 PM

## 2014-07-30 NOTE — Patient Instructions (Signed)
[facility] Discharge Instructions for Patients Receiving Chemotherapy  Today you received the following chemotherapy agents Carboplatin, Gemzar.  To help prevent nausea and vomiting after your treatment, we encourage you to take your nausea medication as directed. If you develop nausea and vomiting that is not controlled by your nausea medication, call the clinic.   BELOW ARE SYMPTOMS THAT SHOULD BE REPORTED IMMEDIATELY:  *FEVER GREATER THAN 100.5 F  *CHILLS WITH OR WITHOUT FEVER  NAUSEA AND VOMITING THAT IS NOT CONTROLLED WITH YOUR NAUSEA MEDICATION  *UNUSUAL SHORTNESS OF BREATH  *UNUSUAL BRUISING OR BLEEDING  TENDERNESS IN MOUTH AND THROAT WITH OR WITHOUT PRESENCE OF ULCERS  *URINARY PROBLEMS  *BOWEL PROBLEMS  UNUSUAL RASH Items with * indicate a potential emergency and should be followed up as soon as possible.  Feel free to call the clinic you have any questions or concerns. The clinic phone number is [phone].

## 2014-07-30 NOTE — Progress Notes (Signed)
[place]  [phone] [phone]) [phone]     ID: Monica [name] DOB: 1946/02/08  MR#: [id number]  DEY#:[id number]  Patient Care Team: Maury Dus, MD as PCP - General (Family Medicine) Excell Seltzer, MD as Consulting Physician (General Surgery) Chauncey Cruel, MD as Consulting Physician (Oncology) Jodelle Gross, MD as Consulting Physician (Radiation Oncology) Maisie Fus, MD as Consulting Physician (Obstetrics and Gynecology)   CHIEF COMPLAINT: Newly diagnosed breast cancer CURRENT TREATMENT:  On adjuvant chemotherapy  BREAST CANCER HISTORY: From the original intake note:  Monica [name] showing a possible mass in her left breast. On 01/05/2014 left diagnostic mammography and ultrasonography at the breast Center showed 2 adjacent poorly defined masses deep in the lower outer portion of the left breast. On physical exam these were palpable measuring approximately 3 cm altogether, at the 4:00 position 16 cm from the left nipple. There were no palpable left axillary lymph nodes. Ultrasound confirmed to and adjacent irregularly marginated hypoechoic masses in the area in question, measuring 1.5 and 1.0 cm respectively. The total area of by ultrasonography measured 2.6 cm. In addition, to left axillary lymph nodes showed mild cortical thickening in one of them showed loss of the normal fatty hilum.  On 01/10/2014 the patient underwent separate biopsies of both the left breast masses in question as well as the more suspicious lymph node. The final pathology ([id number]) showed the lymph node to be benign. (This was interpreted as concordant). Both of the breast masses, however, were positive for an invasive ductal carcinoma, which was HER-2 not amplified, with the signals ratio of 0.98 and the number per cell being 2.45. Estrogen and progesterone receptor determination is pending but looks negative at this  point.  On 01/16/2014 the patient underwent bilateral breast MRI the right breast was unremarkable. In the left breast lower outer quadrant there were 2 oval masses measuring 1.5 and 2.2 cm. Taken together they was approximately 4 cm of disease. There were no findings of concern in the right breast. In the left axilla there was a single mildly prominent lymph node. There was no suspicious internal mammary adenopathy.  The patient's subsequent history is as detailed below  INTERVAL HISTORY: Monica [name] returns today for follow up of her breast cancer accompanied by her goddaughters. Today is day 8, cycle 3 of 3 planned cycles of carboplatin and gemcitabine, both drugs given days 1 and 8 of each 21 day cycle, with Monica [name] on day 9.  Monica [name] vomited before our session began and once yesterday evening. She took some hydrocodone for her joint pain and believes she should have taken it with food. She has not been excessively nauseous before these incidents, and generally she has tolerated the pain med well.   REVIEW OF SYSTEMS: Monica [name] denies fevers or chills. She has some mild constipation but takes stool softeners and laxatives as needed. Her appetite is "too good" and she is gaining some weight. She denies mouth sores or rashes. 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 Fatigue is her main complaint, especially with this regimen. She denies shortness of breath, chest pain, cough or palpitations. She sleeps well with benadryl. A detailed review of systems is otherwise negative.    PAST MEDICAL HISTORY: Past Medical History  Diagnosis Date  . Allergy codeine    rapid heart beat, cold sweat  . Arthritis   . Breast cancer   . Hypertension  PAST SURGICAL HISTORY: Past Surgical History  Procedure Laterality Date  . Knee arthroscopy  2012    right  . Lumbar laminectomy  1991  . Colonoscopy    . Foot arthroplasty  1998    right  . Abdominal hysterectomy  1992    . Breast lumpectomy with needle localization and axillary sentinel lymph node bx Left 02/12/2014    Procedure: LEFT BREAST LUMPECTOMY WITH NEEDLE LOCALIZATION AND LEFT AXILLARY SENTINEL LYMPH NODE BX;  Surgeon: [name], MD;  Location: [facility];  Service: General;  Laterality: Left;  . Portacath placement Right 02/12/2014    Procedure: INSERTION PORT-A-CATH;  Surgeon: [name], MD;  Location: [facility];  Service: General;  Laterality: Right;    FAMILY HISTORY Family History  Problem Relation Age of Onset  . Cancer Sister   . Cancer Brother   . Cancer Maternal Aunt   . Cancer Paternal Aunt    the patient's father died at the age of 105 from a stroke. The patient's mother died at the age of 7 from "multiple causes". The patient had 2 brothers and 2 sisters. The patient's sister was diagnosed with uterine cancer at the age of 40, and the patient's brother [name] had "bone cancer" and died in his 2s. He was treated at the [facility]. There is no history of breast or ovarian cancer in the family to the patient's knowledge.  GYNECOLOGIC HISTORY:  No LMP recorded. Patient has had a hysterectomy. Menarche age 22, the patient is GX P0. She underwent hysterectomy in 1992, with no salpingo-oophorectomy. She has been on estrogen replacement since. 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 Her husband Monica [name] worked for [employer] in [place] as a Facilities manager. 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 The patient's god-daughter Monica [name] is an Optometrist. The patient has no grandchildren. The patient attends a local church of  Monica [name], and her husband Monica [name] is pastor   ADVANCED DIRECTIVES: In place   HEALTH MAINTENANCE: History  Substance Use Topics  . Smoking status: Never Smoker    . Smokeless tobacco: Never Used  . Alcohol Use: No     Colonoscopy: October 2008  PAP: July 2015  Bone density: 01/01/2014  Lipid panel:  Allergies  Allergen Reactions  . Codeine Other (See Comments)    Heart fast    Current Outpatient Prescriptions  Medication Sig Dispense Refill  . Alum & Mag Hydroxide-Simeth (MAGIC MOUTHWASH) SOLN Take 5-10 mLs by mouth 4 (four) times daily.    [name] amLODipine (NORVASC) 5 MG tablet Take 5 mg by mouth daily.    . Cholecalciferol (VITAMIN D3) 2000 UNITS TABS Take 1 tablet by mouth daily.    [name] docusate sodium (COLACE) 100 MG capsule Take 100 mg by mouth 2 (two) times daily.    . Ginkgo Biloba 120 MG CAPS Take 1 capsule by mouth daily.    [name] HYDROcodone-acetaminophen (NORCO/VICODIN) 5-325 MG per tablet Take 1 tablet by mouth every 4 (four) hours as needed for moderate pain.     [name] ibuprofen (ADVIL,MOTRIN) 200 MG tablet Take 400 mg by mouth daily.    [name] lidocaine-prilocaine (EMLA) cream     . LORazepam (ATIVAN) 0.5 MG tablet Take 1 tablet (0.5 mg total) by mouth at bedtime as needed (Nausea or vomiting). 30 tablet 0  .  Melatonin 5 MG TABS Take 1 tablet by mouth at bedtime as needed (sleep).    . ondansetron (ZOFRAN) 8 MG tablet TAKE ONE TABLET BY MOUTH TWICE DAILY AS NEEDED START  ON  THE  THIRD  DAY  AFTER  CHEMOTHERAPY 30 tablet 2  . potassium chloride (MICRO-K) 10 MEQ CR capsule Take 2 capsules (20 mEq total) by mouth daily. 120 capsule 1  . UNABLE TO FIND Take 3 tablets by mouth 2 (two) times daily. OMEGA XL    . valsartan-hydrochlorothiazide (DIOVAN-HCT) 320-25 MG per tablet Take 1 tablet by mouth daily.    [name] dextromethorphan-guaiFENesin (MUCINEX DM) 30-600 MG per 12 hr tablet Take 1 tablet by mouth 2 (two) times daily.    . fluconazole (DIFLUCAN) 150 MG tablet Take 1 tablet (150 mg total) by mouth as directed. (Patient not taking: Reported on 07/30/2014) 2 tablet 0  . nystatin-triamcinolone ointment (MYCOLOG)     . oxyCODONE (OXY IR/ROXICODONE) 5 MG  immediate release tablet Take 1-2 tablets (5-10 mg total) by mouth every 4 (four) hours as needed for moderate pain, severe pain or breakthrough pain. (Patient not taking: Reported on 07/30/2014) 40 tablet 0  . prochlorperazine (COMPAZINE) 10 MG tablet Take 1 tablet (10 mg total) by mouth every 6 (six) hours as needed (Nausea or vomiting). (Patient not taking: Reported on 07/30/2014) 70 tablet 2  . traZODone (DESYREL) 50 MG tablet Take 1 tablet (50 mg total) by mouth at bedtime. (Patient not taking: Reported on 07/30/2014) 30 tablet 2   No current facility-administered medications for this visit.    OBJECTIVE: Middle-aged [place] American woman in no acute distress  Filed Vitals:   07/30/14 1110  BP: 141/73  Pulse: 87  Temp: 98 F (36.7 C)  Resp: 22     Body mass index is 41.05 kg/(m^2).    ECOG FS:1 - Symptomatic but completely ambulatory  Sclerae unicteric, pupils equal and reactive Oropharynx clear and moist-- no thrush No cervical or supraclavicular adenopathy Lungs no rales or rhonchi Heart regular rate and rhythm Abd soft, nontender, positive bowel sounds MSK no focal spinal tenderness, no upper extremity lymphedema Neuro: nonfocal, well oriented, appropriate affect Breasts: deferred  LAB RESULTS:  CMP     Component Value Date/Time   NA 141 07/30/2014 1044   NA 141 04/18/2014 0444   K 3.8 07/30/2014 1044   K 3.7 04/18/2014 0444   CL 106 04/18/2014 0444   CO2 26 07/30/2014 1044   CO2 24 04/18/2014 0444   GLUCOSE 151* 07/30/2014 1044   GLUCOSE 127* 04/18/2014 0444   BUN 13.6 07/30/2014 1044   BUN 6 04/18/2014 0444   CREATININE 0.9 07/30/2014 1044   CREATININE 0.66 04/18/2014 0444   CALCIUM 9.6 07/30/2014 1044   CALCIUM 8.7 04/18/2014 0444   PROT 6.7 07/30/2014 1044   PROT 5.8* 04/16/2014 0515   ALBUMIN 3.5 07/30/2014 1044   ALBUMIN 2.6* 04/16/2014 0515   AST 39* 07/30/2014 1044   AST 12 04/16/2014 0515   ALT 53 07/30/2014 1044   ALT 15 04/16/2014 0515   ALKPHOS 87  07/30/2014 1044   ALKPHOS 90 04/16/2014 0515   BILITOT 0.29 07/30/2014 1044   BILITOT 0.5 04/16/2014 0515   GFRNONAA >90 04/18/2014 0444   GFRAA >90 04/18/2014 0444    I No results found for: SPEP  Lab Results  Component Value Date   WBC 3.2* 07/30/2014   NEUTROABS 1.9 07/30/2014   HGB 9.2* 07/30/2014   HCT 29.0* 07/30/2014   MCV  94.4 07/30/2014   PLT 290 07/30/2014      Chemistry      Component Value Date/Time   NA 141 07/30/2014 1044   NA 141 04/18/2014 0444   K 3.8 07/30/2014 1044   K 3.7 04/18/2014 0444   CL 106 04/18/2014 0444   CO2 26 07/30/2014 1044   CO2 24 04/18/2014 0444   BUN 13.6 07/30/2014 1044   BUN 6 04/18/2014 0444   CREATININE 0.9 07/30/2014 1044   CREATININE 0.66 04/18/2014 0444      Component Value Date/Time   CALCIUM 9.6 07/30/2014 1044   CALCIUM 8.7 04/18/2014 0444   ALKPHOS 87 07/30/2014 1044   ALKPHOS 90 04/16/2014 0515   AST 39* 07/30/2014 1044   AST 12 04/16/2014 0515   ALT 53 07/30/2014 1044   ALT 15 04/16/2014 0515   BILITOT 0.29 07/30/2014 1044   BILITOT 0.5 04/16/2014 0515       No results found for: LABCA2  No components found for: LABCA125  No results for input(s): INR in the last 168 hours.  Urinalysis    Component Value Date/Time   COLORURINE YELLOW 04/15/2014 1814    STUDIES: Most recent echocardiogram on 04/03/14 showed a well preserved ejection fraction of 60-65%    ASSESSMENT: 69 y.o. [place] woman status post left breast biopsy of 2 separate lower outer quadrant masses 01/10/2014 for a multifocal cT2 cNX, stage II invasive ductal carcinoma, grade 3, with an MIB-1 of 90%, HER-2 negative, estrogen receptors 3% positive with weak staining, progesterone receptor negative, with an MIB-1 of 90%; this is clinically a "triple-negative" breast cancer and will be treated as such  (1) a suspicious left axillary lymph node biopsied 01/10/2014 was negative (concordant)  (2) left lumpectomy and sentinel lymph node  sampling 02/12/2014 showed an mpT2 pN0, stage IIA invasive ductal carcinoma grade 3, repeat HER-2 again negative  (3) adjuvant chemotherapy consisted of  (a) dose dense doxorubicin and cyclophosphamide x4 completed 05/02/2014  (b) weekly paclitaxel and carboplatin with 3 of 12 doses given, then stopped because of neuropathy  (c) Carboplatin and Gemzar started 06/11/2014, given on day 1 & day 8 of a 21 days cycle for 3 planned cycles. Monica [name] given on day 9 of each cycle.   (4) adjuvant radiation to follow chemotherapy  PLAN:  Khristina is excited to be completing her chemotherapy today. The labs were reviewed in detail and were relatively stable. She has some treatment related anemia with an hgb of 9.2, but besides fatigue she is asymptomatic at this time. She will proceed with day 8, cycle 3 of carboplatin and gemcitabine. She will receive Monica [name] tomorrow.   I have placed orders for a consult visit with Dr. Lisbeth Renshaw as she is to proceed with radiation after chemotherapy. She would like to have her port out as soon as possible.  Alfa will return in 6 weeks for labs and a follow up visit with Dr. Jana Hakim. She understands and agrees with this plan. She knows the goal of treatment in her case is cure. She has been encouraged to call with any issues that might arise before her next visit here.   Marcelino Duster, [place] [phone] 07/30/2014 11:30 AM

## 2014-07-30 NOTE — Telephone Encounter (Signed)
, °

## 2014-07-31 ENCOUNTER — Ambulatory Visit: Payer: BLUE CROSS/BLUE SHIELD

## 2014-07-31 ENCOUNTER — Encounter: Payer: Self-pay | Admitting: Radiation Oncology

## 2014-08-01 ENCOUNTER — Ambulatory Visit
Admission: RE | Admit: 2014-08-01 | Discharge: 2014-08-01 | Disposition: A | Discharge status: Home / Self Care | Payer: Medicare Other | Source: Ambulatory Visit | Attending: Radiation Oncology | Admitting: Radiation Oncology

## 2014-08-01 ENCOUNTER — Encounter: Payer: Self-pay | Admitting: Radiation Oncology

## 2014-08-01 ENCOUNTER — Ambulatory Visit (HOSPITAL_BASED_OUTPATIENT_CLINIC_OR_DEPARTMENT_OTHER): Payer: BLUE CROSS/BLUE SHIELD

## 2014-08-01 ENCOUNTER — Ambulatory Visit: Payer: BLUE CROSS/BLUE SHIELD

## 2014-08-01 VITALS — BP 117/64 | HR 83 | Temp 98.5°F | Resp 20 | Ht 66.0 in | Wt 257.4 lb

## 2014-08-01 DIAGNOSIS — Z5189 Encounter for other specified aftercare: Secondary | ICD-10-CM

## 2014-08-01 DIAGNOSIS — Z171 Estrogen receptor negative status [ER-]: Secondary | ICD-10-CM | POA: Insufficient documentation

## 2014-08-01 DIAGNOSIS — C50512 Malignant neoplasm of lower-outer quadrant of left female breast: Secondary | ICD-10-CM

## 2014-08-01 DIAGNOSIS — Z510 Encounter for antineoplastic radiation therapy: Secondary | ICD-10-CM | POA: Insufficient documentation

## 2014-08-01 DIAGNOSIS — L599 Disorder of the skin and subcutaneous tissue related to radiation, unspecified: Secondary | ICD-10-CM | POA: Diagnosis not present

## 2014-08-01 DIAGNOSIS — Z791 Long term (current) use of non-steroidal anti-inflammatories (NSAID): Secondary | ICD-10-CM | POA: Diagnosis not present

## 2014-08-01 DIAGNOSIS — Z9221 Personal history of antineoplastic chemotherapy: Secondary | ICD-10-CM | POA: Insufficient documentation

## 2014-08-01 MED ORDER — PEGFILGRASTIM INJECTION 6 MG/0.6ML
6.0000 mg | Freq: Once | SUBCUTANEOUS | Status: AC
  Administered 2014-08-01: 6 mg via SUBCUTANEOUS
  Filled 2014-08-01: qty 0.6

## 2014-08-01 NOTE — Progress Notes (Addendum)
Radiation Oncology         [phone]) [phone] ________________________________  Name: TEKESHIA KLAHR MRN: [id number]  Date: 08/01/2014  DOB: 06/29/1945  Follow-Up Visit Note  CC: Vena Austria, MD  Magrinat, Virgie Dad, MD  Diagnosis:    Breast cancer of lower-outer quadrant of left female breast   Staging form: Breast, AJCC 7th Edition     Clinical: Stage IIA (T2, N0, cM0(i+)) - Unsigned       Staging comments: Clinical staging performed at [facility]-Disciplinary Oncology Conference 01/17/14.      Pathologic: No stage assigned - Unsigned   Narrative:  The patient returns today for follow-up.  She has completed chemotherapy and now presents for further discussion and coordination of an anticipated course of radiation treatment. She states that, overall, she is doing well.  She tolerated chemotherapy fairly well. She is eager to proceed with radiation treatment and complete this in the near future..                              ALLERGIES:  is allergic to codeine.  Meds: Current Outpatient Prescriptions  Medication Sig Dispense Refill  . Alum & Mag Hydroxide-Simeth (MAGIC MOUTHWASH) SOLN Take 5-10 mLs by mouth 4 (four) times daily.    [name] amLODipine (NORVASC) 5 MG tablet Take 5 mg by mouth daily.    . Cholecalciferol (VITAMIN D3) 2000 UNITS TABS Take 1 tablet by mouth daily.    [name] docusate sodium (COLACE) 100 MG capsule Take 100 mg by mouth 2 (two) times daily.    . Ginkgo Biloba 120 MG CAPS Take 1 capsule by mouth daily.    [name] HYDROcodone-acetaminophen (NORCO/VICODIN) 5-325 MG per tablet Take 1 tablet by mouth every 4 (four) hours as needed for moderate pain.     [name] ibuprofen (ADVIL,MOTRIN) 200 MG tablet Take 400 mg by mouth daily.    [name] LORazepam (ATIVAN) 0.5 MG tablet Take 1 tablet (0.5 mg total) by mouth at bedtime as needed (Nausea or vomiting). 30 tablet 0  . Melatonin 5 MG TABS Take 1 tablet by mouth at bedtime as needed (sleep).    . potassium chloride (MICRO-K) 10 MEQ CR  capsule Take 2 capsules (20 mEq total) by mouth daily. 120 capsule 1  . UNABLE TO FIND Take 3 tablets by mouth 2 (two) times daily. OMEGA XL    . valsartan-hydrochlorothiazide (DIOVAN-HCT) 320-25 MG per tablet Take 1 tablet by mouth daily.    [name] dextromethorphan-guaiFENesin (MUCINEX DM) 30-600 MG per 12 hr tablet Take 1 tablet by mouth 2 (two) times daily.    [name] lidocaine-prilocaine (EMLA) cream     . nystatin-triamcinolone ointment (MYCOLOG)     . ondansetron (ZOFRAN) 8 MG tablet TAKE ONE TABLET BY MOUTH TWICE DAILY AS NEEDED START  ON  THE  THIRD  DAY  AFTER  CHEMOTHERAPY (Patient not taking: Reported on 08/01/2014) 30 tablet 2  . oxyCODONE (OXY IR/ROXICODONE) 5 MG immediate release tablet Take 1-2 tablets (5-10 mg total) by mouth every 4 (four) hours as needed for moderate pain, severe pain or breakthrough pain. (Patient not taking: Reported on 08/01/2014) 40 tablet 0  . prochlorperazine (COMPAZINE) 10 MG tablet Take 1 tablet (10 mg total) by mouth every 6 (six) hours as needed (Nausea or vomiting). (Patient not taking: Reported on 07/30/2014) 70 tablet 2  . traZODone (DESYREL) 50 MG tablet Take 1 tablet (50 mg total) by mouth at bedtime. (Patient not taking:  Reported on 07/30/2014) 30 tablet 2   No current facility-administered medications for this encounter.    Physical Findings: The patient is in no acute distress. Patient is alert and oriented.  height is 5\' 6"  (1.676 m) and weight is 257 lb 6.4 oz (116.756 kg). Her oral temperature is 98.5 F (36.9 C). Her blood pressure is 117/64 and her pulse is 83. Her respiration is 20. .     Lab Findings: Lab Results  Component Value Date   WBC 3.2* 07/30/2014   HGB 9.2* 07/30/2014   HCT 29.0* 07/30/2014   MCV 94.4 07/30/2014   PLT 290 07/30/2014     Radiographic Findings: No results found.  Impression:      Breast cancer of lower-outer quadrant of left female breast   Staging form: Breast, AJCC 7th Edition     Clinical: Stage IIA (T2, N0,  cM0(i+)) - Unsigned       Staging comments: Clinical staging performed at [facility]-Disciplinary Oncology Conference 01/17/14.      Pathologic: No stage assigned - Unsigned  The patient has completed surgery and adjuvant chemotherapy. She is suitable to proceed with adjuvant radiotherapy at this time.  I once again discussed the rationale for radiation treatment in this setting. We discussed the potential benefit of radiation treatment as well as the possible side effects and risks. All of her questions were answered.  The patient wishes to proceed with radiation treatment.  Plan:  The patient will be scheduled for a simulation in the near future such that we can proceed with radiation treatment planning. I anticipate treating the patient to the left breast for approximately 4 weeks with whole breast tangent fields.  I spent 15 minutes with the patient today, the majority of which was spent counseling the patient on the diagnosis of cancer and coordinating care.  ------------------------------------------------  Jodelle Gross, MD, PhD

## 2014-08-01 NOTE — Progress Notes (Signed)
Please see the Nurse Progress Note in the MD Initial Consult Encounter for this patient. 

## 2014-08-01 NOTE — Addendum Note (Signed)
Encounter addended by: Jodelle Gross, MD on: 08/01/2014 11:59 PM<BR>     Documentation filed: Notes Section

## 2014-08-13 ENCOUNTER — Ambulatory Visit: Payer: Medicare Other | Admitting: Radiation Oncology

## 2014-08-17 ENCOUNTER — Ambulatory Visit
Admission: RE | Admit: 2014-08-17 | Discharge: 2014-08-17 | Disposition: A | Discharge status: Home / Self Care | Payer: Medicare Other | Source: Ambulatory Visit | Attending: Radiation Oncology | Admitting: Radiation Oncology

## 2014-08-17 DIAGNOSIS — Z510 Encounter for antineoplastic radiation therapy: Secondary | ICD-10-CM | POA: Diagnosis not present

## 2014-08-17 DIAGNOSIS — C50512 Malignant neoplasm of lower-outer quadrant of left female breast: Secondary | ICD-10-CM

## 2014-08-20 ENCOUNTER — Encounter: Payer: Self-pay | Admitting: Radiation Oncology

## 2014-08-20 NOTE — Progress Notes (Signed)
  Radiation Oncology         (336) [phone] ________________________________  Name: Monica [name] MRN: [id number]  Date: 08/17/2014  DOB: 13-Dec-1945  SIMULATION AND TREATMENT PLANNING NOTE  The patient presented for simulation prior to beginning her course of radiation treatment for her diagnosis of left-sided breast cancer. The patient was placed in a supine position on a breast board. A customized vac-lock bag was constructed and this complex treatment device will be used on a daily basis during her treatment. In this fashion, a CT scan was obtained through the chest area and an isocenter was placed near the chest wall within the breast.  The patient will be planned to receive a course of radiation initially to a dose of 42.5 Gy. This will consist of a whole breast radiotherapy technique. To accomplish this, 2 customized blocks have been designed which will correspond to medial and lateral whole breast tangent fields. This treatment will be accomplished at 2.5 Gy per fraction. A forward planning technique will also be evaluated to determine if this approach improves the plan. It is anticipated that the patient will then receive a 7.5 Gy boost to the seroma cavity which has been contoured. This will be accomplished at 2.5 Gy per fraction.   This initial treatment will consist of a 3-D conformal technique. The seroma has been contoured as the primary target structure. Additionally, dose volume histograms of both this target as well as the lungs and heart will also be evaluated. Such an approach is necessary to ensure that the target area is adequately covered while the nearby critical  normal structures are adequately spared.  Plan:  The final anticipated total dose therefore will correspond to 50 Gy.    _______________________________   Jodelle Gross, MD, PhD

## 2014-08-23 DIAGNOSIS — Z171 Estrogen receptor negative status [ER-]: Secondary | ICD-10-CM | POA: Diagnosis not present

## 2014-08-23 DIAGNOSIS — L599 Disorder of the skin and subcutaneous tissue related to radiation, unspecified: Secondary | ICD-10-CM | POA: Diagnosis not present

## 2014-08-23 DIAGNOSIS — Z9221 Personal history of antineoplastic chemotherapy: Secondary | ICD-10-CM | POA: Diagnosis not present

## 2014-08-23 DIAGNOSIS — Z791 Long term (current) use of non-steroidal anti-inflammatories (NSAID): Secondary | ICD-10-CM | POA: Diagnosis not present

## 2014-08-23 DIAGNOSIS — C50512 Malignant neoplasm of lower-outer quadrant of left female breast: Secondary | ICD-10-CM | POA: Diagnosis not present

## 2014-08-23 DIAGNOSIS — Z510 Encounter for antineoplastic radiation therapy: Secondary | ICD-10-CM | POA: Diagnosis not present

## 2014-08-24 ENCOUNTER — Ambulatory Visit
Admission: RE | Admit: 2014-08-24 | Discharge: 2014-08-24 | Disposition: A | Discharge status: Home / Self Care | Payer: Medicare Other | Source: Ambulatory Visit | Attending: Radiation Oncology | Admitting: Radiation Oncology

## 2014-08-24 ENCOUNTER — Ambulatory Visit
Admission: RE | Admit: 2014-08-24 | Discharge: 2014-08-24 | Disposition: A | Discharge status: Home / Self Care | Payer: BLUE CROSS/BLUE SHIELD | Source: Ambulatory Visit | Attending: Radiation Oncology | Admitting: Radiation Oncology

## 2014-08-24 DIAGNOSIS — C50512 Malignant neoplasm of lower-outer quadrant of left female breast: Secondary | ICD-10-CM

## 2014-08-24 DIAGNOSIS — Z510 Encounter for antineoplastic radiation therapy: Secondary | ICD-10-CM | POA: Diagnosis not present

## 2014-08-24 MED ORDER — RADIAPLEXRX EX GEL
Freq: Once | CUTANEOUS | Status: AC
  Administered 2014-08-24: 13:00:00 via TOPICAL

## 2014-08-24 MED ORDER — ALRA NON-METALLIC DEODORANT (RAD-ONC)
1.0000 "application " | Freq: Once | TOPICAL | Status: AC
  Administered 2014-08-24: 1 via TOPICAL

## 2014-08-24 NOTE — Progress Notes (Signed)
Patient education done, radiation therapy and you book, my business card, fler on skin products, radiaplex gel cream, alra deodorant given, discussed wways to manage pain, skin irritation, and fatigue, sees MD weekly and prn, increase protein in diet, drink plenty water,stay hydrated, ,all questions answered, teach back givebn 12:55 PM

## 2014-08-27 ENCOUNTER — Ambulatory Visit
Admission: RE | Admit: 2014-08-27 | Discharge: 2014-08-27 | Disposition: A | Discharge status: Home / Self Care | Payer: Medicare Other | Source: Ambulatory Visit | Attending: Radiation Oncology | Admitting: Radiation Oncology

## 2014-08-27 DIAGNOSIS — Z510 Encounter for antineoplastic radiation therapy: Secondary | ICD-10-CM | POA: Diagnosis not present

## 2014-08-28 ENCOUNTER — Ambulatory Visit
Admission: RE | Admit: 2014-08-28 | Discharge: 2014-08-28 | Disposition: A | Discharge status: Home / Self Care | Payer: Medicare Other | Source: Ambulatory Visit | Attending: Radiation Oncology | Admitting: Radiation Oncology

## 2014-08-28 DIAGNOSIS — Z510 Encounter for antineoplastic radiation therapy: Secondary | ICD-10-CM | POA: Diagnosis not present

## 2014-08-29 ENCOUNTER — Encounter: Payer: Self-pay | Admitting: *Deleted

## 2014-08-29 ENCOUNTER — Ambulatory Visit
Admission: RE | Admit: 2014-08-29 | Discharge: 2014-08-29 | Disposition: A | Discharge status: Home / Self Care | Payer: BLUE CROSS/BLUE SHIELD | Source: Ambulatory Visit | Attending: Radiation Oncology | Admitting: Radiation Oncology

## 2014-08-29 ENCOUNTER — Encounter: Payer: Self-pay | Admitting: Radiation Oncology

## 2014-08-29 ENCOUNTER — Ambulatory Visit
Admission: RE | Admit: 2014-08-29 | Discharge: 2014-08-29 | Disposition: A | Discharge status: Home / Self Care | Payer: Medicare Other | Source: Ambulatory Visit | Attending: Radiation Oncology | Admitting: Radiation Oncology

## 2014-08-29 VITALS — BP 141/72 | HR 70 | Temp 97.8°F | Resp 20 | Wt 245.8 lb

## 2014-08-29 DIAGNOSIS — C50512 Malignant neoplasm of lower-outer quadrant of left female breast: Secondary | ICD-10-CM

## 2014-08-29 DIAGNOSIS — Z510 Encounter for antineoplastic radiation therapy: Secondary | ICD-10-CM | POA: Diagnosis not present

## 2014-08-29 NOTE — Progress Notes (Signed)
Department of Radiation Oncology  Phone:  [phone] Fax:        [phone]  Weekly Treatment Note    Name: Monica [name] Date: 08/29/2014 MRN: [id number] DOB: May 01, 1946   Current dose: 7.5 Gy  Current fraction:3   MEDICATIONS: Current Outpatient Prescriptions  Medication Sig Dispense Refill  . amLODipine (NORVASC) 5 MG tablet Take 5 mg by mouth daily.    . Cholecalciferol (VITAMIN D3) 2000 UNITS TABS Take 1 tablet by mouth daily.    [name] docusate sodium (COLACE) 100 MG capsule Take 100 mg by mouth 2 (two) times daily.    . Ginkgo Biloba 120 MG CAPS Take 1 capsule by mouth daily.    . hyaluronate sodium (RADIAPLEXRX) GEL Apply 1 application topically 2 (two) times daily.    [name] ibuprofen (ADVIL,MOTRIN) 200 MG tablet Take 400 mg by mouth daily.    [name] LORazepam (ATIVAN) 0.5 MG tablet Take 1 tablet (0.5 mg total) by mouth at bedtime as needed (Nausea or vomiting). 30 tablet 0  . Melatonin 5 MG TABS Take 1 tablet by mouth at bedtime as needed (sleep).    . meloxicam (MOBIC) 15 MG tablet Take 15 mg by mouth daily.    . non-metallic deodorant Jethro Poling) MISC Apply 1 application topically daily as needed.    . potassium chloride (MICRO-K) 10 MEQ CR capsule Take 2 capsules (20 mEq total) by mouth daily. 120 capsule 1  . UNABLE TO FIND Take 3 tablets by mouth 2 (two) times daily. OMEGA XL    . valsartan-hydrochlorothiazide (DIOVAN-HCT) 320-25 MG per tablet Take 1 tablet by mouth daily.    . Alum & Mag Hydroxide-Simeth (MAGIC MOUTHWASH) SOLN Take 5-10 mLs by mouth 4 (four) times daily.    [name] dextromethorphan-guaiFENesin (MUCINEX DM) 30-600 MG per 12 hr tablet Take 1 tablet by mouth 2 (two) times daily.    [name] HYDROcodone-acetaminophen (NORCO/VICODIN) 5-325 MG per tablet Take 1 tablet by mouth every 4 (four) hours as needed for moderate pain.     [name] lidocaine-prilocaine (EMLA) cream     . nystatin-triamcinolone ointment (MYCOLOG)     . ondansetron (ZOFRAN) 8 MG tablet TAKE ONE TABLET BY MOUTH  TWICE DAILY AS NEEDED START  ON  THE  THIRD  DAY  AFTER  CHEMOTHERAPY (Patient not taking: Reported on 08/01/2014) 30 tablet 2  . oxyCODONE (OXY IR/ROXICODONE) 5 MG immediate release tablet Take 1-2 tablets (5-10 mg total) by mouth every 4 (four) hours as needed for moderate pain, severe pain or breakthrough pain. (Patient not taking: Reported on 08/01/2014) 40 tablet 0  . prochlorperazine (COMPAZINE) 10 MG tablet Take 1 tablet (10 mg total) by mouth every 6 (six) hours as needed (Nausea or vomiting). (Patient not taking: Reported on 07/30/2014) 70 tablet 2  . traZODone (DESYREL) 50 MG tablet Take 1 tablet (50 mg total) by mouth at bedtime. (Patient not taking: Reported on 07/30/2014) 30 tablet 2   No current facility-administered medications for this encounter.     ALLERGIES: Codeine   LABORATORY DATA:  Lab Results  Component Value Date   WBC 3.2* 07/30/2014   HGB 9.2* 07/30/2014   HCT 29.0* 07/30/2014   MCV 94.4 07/30/2014   PLT 290 07/30/2014   Lab Results  Component Value Date   NA 141 07/30/2014   K 3.8 07/30/2014   CL 106 04/18/2014   CO2 26 07/30/2014   Lab Results  Component Value Date   ALT 53 07/30/2014   AST 39* 07/30/2014  ALKPHOS 87 07/30/2014   BILITOT 0.29 07/30/2014     NARRATIVE: Maia Plan was seen today for weekly treatment management. The chart was checked and the patient's films were reviewed.  Weekly rad txs 3 completed left breast, no c/o, no skin changes, using radiplex bid, still has loss taste buds but eating, mild fatigue, ,pt education done on 08/24/14  PHYSICAL EXAMINATION: weight is 245 lb 12.8 oz (111.494 kg). Her oral temperature is 97.8 F (36.6 C). Her blood pressure is 141/72 and her pulse is 70. Her respiration is 20.        ASSESSMENT: The patient is doing satisfactorily with treatment.  PLAN: We will continue with the patient's radiation treatment as planned.

## 2014-08-29 NOTE — Progress Notes (Signed)
Weekly rad txs 3 completed left breast, no c/o, no skin changes, using radiplex bid, still has loss taste buds but eating, mild fatigue, ,pt education done on 08/24/14 9:57 AM

## 2014-08-30 ENCOUNTER — Ambulatory Visit
Admission: RE | Admit: 2014-08-30 | Discharge: 2014-08-30 | Disposition: A | Discharge status: Home / Self Care | Payer: Medicare Other | Source: Ambulatory Visit | Attending: Radiation Oncology | Admitting: Radiation Oncology

## 2014-08-30 DIAGNOSIS — Z510 Encounter for antineoplastic radiation therapy: Secondary | ICD-10-CM | POA: Diagnosis not present

## 2014-08-31 ENCOUNTER — Ambulatory Visit
Admission: RE | Admit: 2014-08-31 | Discharge: 2014-08-31 | Disposition: A | Discharge status: Home / Self Care | Payer: Medicare Other | Source: Ambulatory Visit | Attending: Radiation Oncology | Admitting: Radiation Oncology

## 2014-08-31 DIAGNOSIS — Z510 Encounter for antineoplastic radiation therapy: Secondary | ICD-10-CM | POA: Diagnosis not present

## 2014-09-03 ENCOUNTER — Ambulatory Visit
Admission: RE | Admit: 2014-09-03 | Discharge: 2014-09-03 | Disposition: A | Discharge status: Home / Self Care | Payer: Medicare Other | Source: Ambulatory Visit | Attending: Radiation Oncology | Admitting: Radiation Oncology

## 2014-09-03 DIAGNOSIS — Z510 Encounter for antineoplastic radiation therapy: Secondary | ICD-10-CM | POA: Diagnosis not present

## 2014-09-04 ENCOUNTER — Ambulatory Visit
Admission: RE | Admit: 2014-09-04 | Discharge: 2014-09-04 | Disposition: A | Discharge status: Home / Self Care | Payer: Medicare Other | Source: Ambulatory Visit | Attending: Radiation Oncology | Admitting: Radiation Oncology

## 2014-09-04 DIAGNOSIS — Z510 Encounter for antineoplastic radiation therapy: Secondary | ICD-10-CM | POA: Diagnosis not present

## 2014-09-05 ENCOUNTER — Ambulatory Visit
Admission: RE | Admit: 2014-09-05 | Discharge: 2014-09-05 | Disposition: A | Discharge status: Home / Self Care | Payer: BLUE CROSS/BLUE SHIELD | Source: Ambulatory Visit | Attending: Radiation Oncology | Admitting: Radiation Oncology

## 2014-09-05 ENCOUNTER — Encounter: Payer: Self-pay | Admitting: Radiation Oncology

## 2014-09-05 ENCOUNTER — Ambulatory Visit
Admission: RE | Admit: 2014-09-05 | Discharge: 2014-09-05 | Disposition: A | Discharge status: Home / Self Care | Payer: Medicare Other | Source: Ambulatory Visit | Attending: Radiation Oncology | Admitting: Radiation Oncology

## 2014-09-05 VITALS — BP 142/58 | HR 74 | Temp 98.2°F | Resp 16 | Wt 246.2 lb

## 2014-09-05 DIAGNOSIS — Z510 Encounter for antineoplastic radiation therapy: Secondary | ICD-10-CM | POA: Diagnosis not present

## 2014-09-05 DIAGNOSIS — C50512 Malignant neoplasm of lower-outer quadrant of left female breast: Secondary | ICD-10-CM

## 2014-09-05 NOTE — Progress Notes (Signed)
Department of Radiation Oncology  Phone:  [phone] Fax:        [phone]  Weekly Treatment Note    Name: Monica [name] Date: 09/05/2014 MRN: [id number] DOB: [birth year]   Current dose: 20 Gy  Current fraction: 8   MEDICATIONS: Current Outpatient Prescriptions  Medication Sig Dispense Refill  . Alum & Mag Hydroxide-Simeth (MAGIC MOUTHWASH) SOLN Take 5-10 mLs by mouth 4 (four) times daily.    [name] amLODipine (NORVASC) 5 MG tablet Take 5 mg by mouth daily.    . Cholecalciferol (VITAMIN D3) 2000 UNITS TABS Take 1 tablet by mouth daily.    [name] dextromethorphan-guaiFENesin (MUCINEX DM) 30-600 MG per 12 hr tablet Take 1 tablet by mouth 2 (two) times daily.    [name] docusate sodium (COLACE) 100 MG capsule Take 100 mg by mouth 2 (two) times daily.    . Ginkgo Biloba 120 MG CAPS Take 1 capsule by mouth daily.    . hyaluronate sodium (RADIAPLEXRX) GEL Apply 1 application topically 2 (two) times daily.    [name] HYDROcodone-acetaminophen (NORCO/VICODIN) 5-325 MG per tablet Take 1 tablet by mouth every 4 (four) hours as needed for moderate pain.     [name] ibuprofen (ADVIL,MOTRIN) 200 MG tablet Take 400 mg by mouth daily.    [name] lidocaine-prilocaine (EMLA) cream     . LORazepam (ATIVAN) 0.5 MG tablet Take 1 tablet (0.5 mg total) by mouth at bedtime as needed (Nausea or vomiting). 30 tablet 0  . Melatonin 5 MG TABS Take 1 tablet by mouth at bedtime as needed (sleep).    . meloxicam (MOBIC) 15 MG tablet Take 15 mg by mouth daily.    . non-metallic deodorant Jethro Poling) MISC Apply 1 application topically daily as needed.    . nystatin-triamcinolone ointment (MYCOLOG)     . ondansetron (ZOFRAN) 8 MG tablet TAKE ONE TABLET BY MOUTH TWICE DAILY AS NEEDED START  ON  THE  THIRD  DAY  AFTER  CHEMOTHERAPY 30 tablet 2  . oxyCODONE (OXY IR/ROXICODONE) 5 MG immediate release tablet Take 1-2 tablets (5-10 mg total) by mouth every 4 (four) hours as needed for moderate pain, severe pain or breakthrough pain. 40 tablet 0    . potassium chloride (MICRO-K) 10 MEQ CR capsule Take 2 capsules (20 mEq total) by mouth daily. 120 capsule 1  . prochlorperazine (COMPAZINE) 10 MG tablet Take 1 tablet (10 mg total) by mouth every 6 (six) hours as needed (Nausea or vomiting). 70 tablet 2  . traZODone (DESYREL) 50 MG tablet Take 1 tablet (50 mg total) by mouth at bedtime. 30 tablet 2  . UNABLE TO FIND Take 3 tablets by mouth 2 (two) times daily. OMEGA XL    . valsartan-hydrochlorothiazide (DIOVAN-HCT) 320-25 MG per tablet Take 1 tablet by mouth daily.     No current facility-administered medications for this encounter.     ALLERGIES: Codeine   LABORATORY DATA:  Lab Results  Component Value Date   WBC 3.2* 07/30/2014   HGB 9.2* 07/30/2014   HCT 29.0* 07/30/2014   MCV 94.4 07/30/2014   PLT 290 07/30/2014   Lab Results  Component Value Date   NA 141 07/30/2014   K 3.8 07/30/2014   CL 106 04/18/2014   CO2 26 07/30/2014   Lab Results  Component Value Date   ALT 53 07/30/2014   AST 39* 07/30/2014   ALKPHOS 87 07/30/2014   BILITOT 0.29 07/30/2014     NARRATIVE: Monica [name] was seen today for weekly treatment  management. The chart was checked and the patient's films were reviewed.  Weekly rad txs left breast,  8/ 20 completed, no skin changes, skin intact,  Using rdiaplex gel bid, occasional twinges in breast, resolves  Quickly, appetite good, no fatigue   PHYSICAL EXAMINATION: weight is 246 lb 3.2 oz (111.676 kg). Her oral temperature is 98.2 F (36.8 C). Her blood pressure is 142/58 and her pulse is 74. Her respiration is 16.      the skin looks very good. No desquamation.  ASSESSMENT: The patient is doing satisfactorily with treatment.  PLAN: We will continue with the patient's radiation treatment as planned.

## 2014-09-05 NOTE — Progress Notes (Signed)
Weekly rad txs left breast,  8/ 20 completed, no skin changes, skin intact,  Using rdiaplex gel bid, occasional twinges in breast, resolves  Quickly, appetite good, no fatigue 10:02 AM

## 2014-09-06 ENCOUNTER — Ambulatory Visit
Admission: RE | Admit: 2014-09-06 | Discharge: 2014-09-06 | Disposition: A | Discharge status: Home / Self Care | Payer: Medicare Other | Source: Ambulatory Visit | Attending: Radiation Oncology | Admitting: Radiation Oncology

## 2014-09-06 DIAGNOSIS — Z510 Encounter for antineoplastic radiation therapy: Secondary | ICD-10-CM | POA: Diagnosis not present

## 2014-09-07 ENCOUNTER — Ambulatory Visit
Admission: RE | Admit: 2014-09-07 | Discharge: 2014-09-07 | Disposition: A | Discharge status: Home / Self Care | Payer: Medicare Other | Source: Ambulatory Visit | Attending: Radiation Oncology | Admitting: Radiation Oncology

## 2014-09-07 DIAGNOSIS — Z510 Encounter for antineoplastic radiation therapy: Secondary | ICD-10-CM | POA: Diagnosis not present

## 2014-09-10 ENCOUNTER — Ambulatory Visit
Admission: RE | Admit: 2014-09-10 | Discharge: 2014-09-10 | Disposition: A | Discharge status: Home / Self Care | Payer: Medicare Other | Source: Ambulatory Visit | Attending: Radiation Oncology | Admitting: Radiation Oncology

## 2014-09-10 DIAGNOSIS — Z510 Encounter for antineoplastic radiation therapy: Secondary | ICD-10-CM | POA: Diagnosis not present

## 2014-09-11 ENCOUNTER — Ambulatory Visit
Admission: RE | Admit: 2014-09-11 | Discharge: 2014-09-11 | Disposition: A | Discharge status: Home / Self Care | Payer: Medicare Other | Source: Ambulatory Visit | Attending: Radiation Oncology | Admitting: Radiation Oncology

## 2014-09-11 DIAGNOSIS — Z510 Encounter for antineoplastic radiation therapy: Secondary | ICD-10-CM | POA: Diagnosis not present

## 2014-09-12 ENCOUNTER — Ambulatory Visit (HOSPITAL_BASED_OUTPATIENT_CLINIC_OR_DEPARTMENT_OTHER): Payer: Medicare Other

## 2014-09-12 ENCOUNTER — Telehealth: Payer: Self-pay | Admitting: Oncology

## 2014-09-12 ENCOUNTER — Ambulatory Visit
Admission: RE | Admit: 2014-09-12 | Discharge: 2014-09-12 | Disposition: A | Discharge status: Home / Self Care | Payer: Medicare Other | Source: Ambulatory Visit | Attending: Radiation Oncology | Admitting: Radiation Oncology

## 2014-09-12 ENCOUNTER — Encounter: Payer: Self-pay | Admitting: Radiation Oncology

## 2014-09-12 ENCOUNTER — Ambulatory Visit (HOSPITAL_BASED_OUTPATIENT_CLINIC_OR_DEPARTMENT_OTHER): Payer: Medicare Other | Admitting: Oncology

## 2014-09-12 ENCOUNTER — Other Ambulatory Visit (HOSPITAL_BASED_OUTPATIENT_CLINIC_OR_DEPARTMENT_OTHER): Payer: Medicare Other

## 2014-09-12 VITALS — BP 133/69 | HR 71 | Temp 98.2°F | Resp 18 | Ht 66.0 in | Wt 241.7 lb

## 2014-09-12 DIAGNOSIS — D6481 Anemia due to antineoplastic chemotherapy: Secondary | ICD-10-CM

## 2014-09-12 DIAGNOSIS — C50512 Malignant neoplasm of lower-outer quadrant of left female breast: Secondary | ICD-10-CM

## 2014-09-12 DIAGNOSIS — R5081 Fever presenting with conditions classified elsewhere: Secondary | ICD-10-CM

## 2014-09-12 DIAGNOSIS — G620 Drug-induced polyneuropathy: Secondary | ICD-10-CM | POA: Diagnosis not present

## 2014-09-12 DIAGNOSIS — Z171 Estrogen receptor negative status [ER-]: Secondary | ICD-10-CM

## 2014-09-12 DIAGNOSIS — Z510 Encounter for antineoplastic radiation therapy: Secondary | ICD-10-CM | POA: Diagnosis not present

## 2014-09-12 DIAGNOSIS — T451X5A Adverse effect of antineoplastic and immunosuppressive drugs, initial encounter: Secondary | ICD-10-CM

## 2014-09-12 DIAGNOSIS — D709 Neutropenia, unspecified: Secondary | ICD-10-CM

## 2014-09-12 DIAGNOSIS — Z95828 Presence of other vascular implants and grafts: Secondary | ICD-10-CM

## 2014-09-12 LAB — CBC WITH DIFFERENTIAL/PLATELET
BASO%: 2.2 % — ABNORMAL HIGH (ref 0.0–2.0)
Basophils Absolute: 0.1 10*3/uL (ref 0.0–0.1)
EOS ABS: 0.1 10*3/uL (ref 0.0–0.5)
EOS%: 5.7 % (ref 0.0–7.0)
HEMATOCRIT: 34.5 % — AB (ref 34.8–46.6)
HEMOGLOBIN: 11.4 g/dL — AB (ref 11.6–15.9)
LYMPH%: 28.1 % (ref 14.0–49.7)
MCH: 30.5 pg (ref 25.1–34.0)
MCHC: 33 g/dL (ref 31.5–36.0)
MCV: 92.2 fL (ref 79.5–101.0)
MONO#: 0.3 10*3/uL (ref 0.1–0.9)
MONO%: 12.3 % (ref 0.0–14.0)
NEUT%: 51.7 % (ref 38.4–76.8)
NEUTROS ABS: 1.2 10*3/uL — AB (ref 1.5–6.5)
PLATELETS: 262 10*3/uL (ref 145–400)
RBC: 3.74 10*6/uL (ref 3.70–5.45)
RDW: 16.4 % — ABNORMAL HIGH (ref 11.2–14.5)
WBC: 2.3 10*3/uL — ABNORMAL LOW (ref 3.9–10.3)
lymph#: 0.6 10*3/uL — ABNORMAL LOW (ref 0.9–3.3)

## 2014-09-12 MED ORDER — HEPARIN SOD (PORK) LOCK FLUSH 100 UNIT/ML IV SOLN
500.0000 [IU] | Freq: Once | INTRAVENOUS | Status: AC
  Administered 2014-09-12: 500 [IU] via INTRAVENOUS
  Filled 2014-09-12: qty 5

## 2014-09-12 MED ORDER — SODIUM CHLORIDE 0.9 % IJ SOLN
10.0000 mL | INTRAMUSCULAR | Status: DC | PRN
Start: 2014-09-12 — End: 2014-09-12
  Administered 2014-09-12: 10 mL via INTRAVENOUS
  Filled 2014-09-12: qty 10

## 2014-09-12 NOTE — Telephone Encounter (Signed)
per pof to sch pt appt-gave pt copy of sch °

## 2014-09-12 NOTE — Patient Instructions (Signed)

## 2014-09-12 NOTE — Progress Notes (Signed)
[facility]  [phone] [phone]     ID: ELFIE COSTANZA DOB: [birth year]  MR#: [id number]  NIO#:[id number]  Patient Care Team: Maury Dus, MD as PCP - General (Family Medicine) Excell Seltzer, MD as Consulting Physician (General Surgery) Chauncey Cruel, MD as Consulting Physician (Oncology) Kyung Rudd, MD as Consulting Physician (Radiation Oncology) Maisie Fus, MD as Consulting Physician (Obstetrics and Gynecology)   CHIEF COMPLAINT: Newly diagnosed breast cancer  CURRENT TREATMENT:  Radiation therapy  BREAST CANCER HISTORY: From the original intake note:  [facility] showing a possible mass in her left breast. On 01/05/2014 left diagnostic mammography and ultrasonography at the breast Center showed 2 adjacent poorly defined masses deep in the lower outer portion of the left breast. On physical exam these were palpable measuring approximately 3 cm altogether, at the 4:00 position 16 cm from the left nipple. There were no palpable left axillary lymph nodes. Ultrasound confirmed to and adjacent irregularly marginated hypoechoic masses in the area in question, measuring 1.5 and 1.0 cm respectively. The total area of by ultrasonography measured 2.6 cm. In addition, to left axillary lymph nodes showed mild cortical thickening in one of them showed loss of the normal fatty hilum.  On 01/10/2014 the patient underwent separate biopsies of both the left breast masses in question as well as the more suspicious lymph node. The final pathology ([id number]) showed the lymph node to be benign. (This was interpreted as concordant). Both of the breast masses, however, were positive for an invasive ductal carcinoma, which was HER-2 not amplified, with the signals ratio of 0.98 and the number per cell being 2.45. Estrogen and progesterone receptor determination is pending but looks negative at this  point.  On 01/16/2014 the patient underwent bilateral breast MRI the right breast was unremarkable. In the left breast lower outer quadrant there were 2 oval masses measuring 1.5 and 2.2 cm. Taken together they was approximately 4 cm of disease. There were no findings of concern in the right breast. In the left axilla there was a single mildly prominent lymph node. There was no suspicious internal mammary adenopathy.  The patient's subsequent history is as detailed below  INTERVAL HISTORY: Shermeka returns today for follow up of her breast cancer accompanied by her goddaughter. Sadae is currently undergoing radiation. She has about one week to go. She is tolerating that well. She does feel significant fatigue as her main side effect  REVIEW OF SYSTEMS: Laurna is having significant pain in her upper legs and hips, as well as her knees. She has been started back on meloxicam and that is helping some. She also takes Advil or Aleve about once a day. She doesn't feel she can get on the treadmill. She does have a gym membership and could use a stationary bike or elliptical, possibly. She has a mouth sore that she is treating with Magic mouthwash. She is having some hot flashes. Her peripheral neuropathy has just about resolved. She is a little bit in her left hand, and her right thumb sometimes "locks". She is very concerned about weight issues and would like to see a nutritionist. Aside from this a detailed review of systems today was stable   PAST MEDICAL HISTORY: Past Medical History  Diagnosis Date  . Allergy codeine    rapid heart beat, cold sweat  . Arthritis   . Hypertension   . Breast cancer 01/10/14    Left breast  PAST SURGICAL HISTORY: Past Surgical History  Procedure Laterality Date  . Knee arthroscopy  2012    right  . Lumbar laminectomy  1991  . Colonoscopy    . Foot arthroplasty  1998    right  . Abdominal hysterectomy  1992  . Breast lumpectomy with needle localization and  axillary sentinel lymph node bx Left 02/12/2014    Procedure: LEFT BREAST LUMPECTOMY WITH NEEDLE LOCALIZATION AND LEFT AXILLARY SENTINEL LYMPH NODE BX;  Surgeon: [name], MD;  Location: [facility];  Service: General;  Laterality: Left;  . Portacath placement Right 02/12/2014    Procedure: INSERTION PORT-A-CATH;  Surgeon: [name], MD;  Location: [facility];  Service: General;  Laterality: Right;    FAMILY HISTORY Family History  Problem Relation Age of Onset  . Cancer Sister   . Cancer Brother   . Cancer Maternal Aunt   . Cancer Paternal Aunt    the patient's father died at the age of 67 from a stroke. The patient's mother died at the age of 31 from "multiple causes". The patient had 2 brothers and 2 sisters. The patient's sister was diagnosed with uterine cancer at the age of 54, and the patient's brother [name] had "bone cancer" and died in his 39s. He was treated at the [facility]. There is no history of breast or ovarian cancer in the family to the patient's knowledge.  GYNECOLOGIC HISTORY:  No LMP recorded. Patient has had a hysterectomy. Menarche age 69, the patient is GX P0. She underwent hysterectomy in 1992, with no salpingo-oophorectomy. She has been on estrogen replacement since. 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 Her husband [name] worked for [employer] in [place] as a Facilities manager. The patient's adopted son [name] works as a laborer in [place]. The patient's god-daughter [name] is an Optometrist. The patient has no grandchildren. The patient attends a local church of  [place], and her husband [name] is pastor   ADVANCED DIRECTIVES: In place   HEALTH MAINTENANCE: History  Substance Use Topics  . Smoking status: Never Smoker   . Smokeless tobacco: Never Used  . Alcohol Use:  No     Colonoscopy: October 2008  PAP: July 2015  Bone density: 01/01/2014  Lipid panel:  Allergies  Allergen Reactions  . Codeine Other (See Comments)    Heart fast    Current Outpatient Prescriptions  Medication Sig Dispense Refill  . Alum & Mag Hydroxide-Simeth (MAGIC MOUTHWASH) SOLN Take 5-10 mLs by mouth 4 (four) times daily.    [name] amLODipine (NORVASC) 5 MG tablet Take 5 mg by mouth daily.    . Cholecalciferol (VITAMIN D3) 2000 UNITS TABS Take 1 tablet by mouth daily.    [name] dextromethorphan-guaiFENesin (MUCINEX DM) 30-600 MG per 12 hr tablet Take 1 tablet by mouth 2 (two) times daily.    [name] docusate sodium (COLACE) 100 MG capsule Take 100 mg by mouth 2 (two) times daily.    . Ginkgo Biloba 120 MG CAPS Take 1 capsule by mouth daily.    . hyaluronate sodium (RADIAPLEXRX) GEL Apply 1 application topically 2 (two) times daily.    [name] HYDROcodone-acetaminophen (NORCO/VICODIN) 5-325 MG per tablet Take 1 tablet by mouth every 4 (four) hours as needed for moderate pain.     [name] ibuprofen (ADVIL,MOTRIN) 200 MG tablet Take 400 mg by mouth  daily.    . lidocaine-prilocaine (EMLA) cream     . LORazepam (ATIVAN) 0.5 MG tablet Take 1 tablet (0.5 mg total) by mouth at bedtime as needed (Nausea or vomiting). 30 tablet 0  . Melatonin 5 MG TABS Take 1 tablet by mouth at bedtime as needed (sleep).    . meloxicam (MOBIC) 15 MG tablet Take 15 mg by mouth daily.    . non-metallic deodorant Jethro Poling) MISC Apply 1 application topically daily as needed.    . nystatin-triamcinolone ointment (MYCOLOG)     . ondansetron (ZOFRAN) 8 MG tablet TAKE ONE TABLET BY MOUTH TWICE DAILY AS NEEDED START  ON  THE  THIRD  DAY  AFTER  CHEMOTHERAPY 30 tablet 2  . oxyCODONE (OXY IR/ROXICODONE) 5 MG immediate release tablet Take 1-2 tablets (5-10 mg total) by mouth every 4 (four) hours as needed for moderate pain, severe pain or breakthrough pain. 40 tablet 0  . potassium chloride (MICRO-K) 10 MEQ CR capsule Take 2 capsules (20 mEq  total) by mouth daily. 120 capsule 1  . prochlorperazine (COMPAZINE) 10 MG tablet Take 1 tablet (10 mg total) by mouth every 6 (six) hours as needed (Nausea or vomiting). 70 tablet 2  . traZODone (DESYREL) 50 MG tablet Take 1 tablet (50 mg total) by mouth at bedtime. 30 tablet 2  . UNABLE TO FIND Take 3 tablets by mouth 2 (two) times daily. OMEGA XL    . valsartan-hydrochlorothiazide (DIOVAN-HCT) 320-25 MG per tablet Take 1 tablet by mouth daily.     No current facility-administered medications for this visit.    OBJECTIVE: Middle-aged [place] American woman who appears stated age  35 Vitals:   09/12/14 1048  BP: 133/69  Pulse: 71  Temp: 98.2 F (36.8 C)  Resp: 18     Body mass index is 39.03 kg/(m^2).    ECOG FS:1 - Symptomatic but completely ambulatory  Sclerae unicteric, EOMs intact Oropharynx shows dentition in good repair No cervical or supraclavicular adenopathy Lungs no rales or rhonchi Heart regular rate and rhythm Abd soft, obese, nontender, positive bowel sounds MSK no focal spinal tenderness Neuro: nonfocal, well oriented, appropriate affect Breasts: The right breast is unremarkable. The left breast status post lumpectomy and is currently receiving radiation. There is minimal hyperpigmentation. There is minimal skin thickening. There is no evidence of local recurrence. The left axilla is benign.  LAB RESULTS:  CMP     Component Value Date/Time   NA 141 07/30/2014 1044   NA 141 04/18/2014 0444   K 3.8 07/30/2014 1044   K 3.7 04/18/2014 0444   CL 106 04/18/2014 0444   CO2 26 07/30/2014 1044   CO2 24 04/18/2014 0444   GLUCOSE 151* 07/30/2014 1044   GLUCOSE 127* 04/18/2014 0444   BUN 13.6 07/30/2014 1044   BUN 6 04/18/2014 0444   CREATININE 0.9 07/30/2014 1044   CREATININE 0.66 04/18/2014 0444   CALCIUM 9.6 07/30/2014 1044   CALCIUM 8.7 04/18/2014 0444   PROT 6.7 07/30/2014 1044   PROT 5.8* 04/16/2014 0515   ALBUMIN 3.5 07/30/2014 1044   ALBUMIN 2.6*  04/16/2014 0515   AST 39* 07/30/2014 1044   AST 12 04/16/2014 0515   ALT 53 07/30/2014 1044   ALT 15 04/16/2014 0515   ALKPHOS 87 07/30/2014 1044   ALKPHOS 90 04/16/2014 0515   BILITOT 0.29 07/30/2014 1044   BILITOT 0.5 04/16/2014 0515   GFRNONAA >90 04/18/2014 0444   GFRAA >90 04/18/2014 0444    I No results  found for: SPEP  Lab Results  Component Value Date   WBC 2.3* 09/12/2014   NEUTROABS 1.2* 09/12/2014   HGB 11.4* 09/12/2014   HCT 34.5* 09/12/2014   MCV 92.2 09/12/2014   PLT 262 09/12/2014      Chemistry      Component Value Date/Time   NA 141 07/30/2014 1044   NA 141 04/18/2014 0444   K 3.8 07/30/2014 1044   K 3.7 04/18/2014 0444   CL 106 04/18/2014 0444   CO2 26 07/30/2014 1044   CO2 24 04/18/2014 0444   BUN 13.6 07/30/2014 1044   BUN 6 04/18/2014 0444   CREATININE 0.9 07/30/2014 1044   CREATININE 0.66 04/18/2014 0444      Component Value Date/Time   CALCIUM 9.6 07/30/2014 1044   CALCIUM 8.7 04/18/2014 0444   ALKPHOS 87 07/30/2014 1044   ALKPHOS 90 04/16/2014 0515   AST 39* 07/30/2014 1044   AST 12 04/16/2014 0515   ALT 53 07/30/2014 1044   ALT 15 04/16/2014 0515   BILITOT 0.29 07/30/2014 1044   BILITOT 0.5 04/16/2014 0515       No results found for: LABCA2  No components found for: LIDCV013  No results for input(s): INR in the last 168 hours.  Urinalysis    Component Value Date/Time   COLORURINE YELLOW 04/15/2014 1814    STUDIES: No results found.    ASSESSMENT: 69 y.o. [place] woman status post left breast biopsy of 2 separate lower outer quadrant masses 01/10/2014 for a multifocal cT2 cNX, stage II invasive ductal carcinoma, grade 3, with an MIB-1 of 90%, HER-2 negative, estrogen receptors 3% positive with weak staining, progesterone receptor negative, with an MIB-1 of 90%; this is clinically a "triple-negative" breast cancer and will be treated as such  (1) a suspicious left axillary lymph node biopsied 01/10/2014 was negative  (concordant)  (2) left lumpectomy and sentinel lymph node sampling 02/12/2014 showed an mpT2 pN0, stage IIA invasive ductal carcinoma grade 3, repeat HER-2 again negative  (3) adjuvant chemotherapy consisted of  (a) dose dense doxorubicin and cyclophosphamide x4 completed 05/02/2014  (b) weekly paclitaxel and carboplatin with 3 of 12 doses given, then stopped because of neuropathy  (c) Carboplatin and Gemzar started 06/11/2014, given on day 1 & day 8 of a 21 days cycle for 3 cycles, completed 07/30/2014  (4) adjuvant radiation to be completed 09/21/2014  PLAN:  Abbigale is continuing to recover from her chemotherapy and will complete her radiation after another week or so. In particular I am pleased that her peripheral neuropathy has almost completely resolved.   I have strongly encouraged her to exercise, which I think will get her "out of the hole" recur. I also recommended she consider the "finding your new normal" program. She will be going through an emotional as well as a physical transition in the next few months and that group can be very helpful in smoothing that out.  Her see met today was contaminated by saline and is being repeated. I have suggested that she continue on potassium supplementation for now.  Her cancer was estrogen receptor negative, but she might consider tamoxifen as a preventative and we will discuss that at her next visit with me which will be in 3 months. At that time also we will discuss her follow-up plan. She knows to call for any problems that may develop before the next visit.    Chauncey Cruel, [place] [phone] 09/12/2014 11:00 AM

## 2014-09-13 ENCOUNTER — Ambulatory Visit (HOSPITAL_BASED_OUTPATIENT_CLINIC_OR_DEPARTMENT_OTHER): Payer: Medicare Other

## 2014-09-13 ENCOUNTER — Ambulatory Visit
Admission: RE | Admit: 2014-09-13 | Discharge: 2014-09-13 | Disposition: A | Discharge status: Home / Self Care | Payer: Medicare Other | Source: Ambulatory Visit | Attending: Radiation Oncology | Admitting: Radiation Oncology

## 2014-09-13 ENCOUNTER — Other Ambulatory Visit: Payer: Self-pay

## 2014-09-13 DIAGNOSIS — C50512 Malignant neoplasm of lower-outer quadrant of left female breast: Secondary | ICD-10-CM | POA: Diagnosis not present

## 2014-09-13 DIAGNOSIS — Z510 Encounter for antineoplastic radiation therapy: Secondary | ICD-10-CM | POA: Diagnosis not present

## 2014-09-13 LAB — CBC WITH DIFFERENTIAL/PLATELET
BASO%: 2.5 % — ABNORMAL HIGH (ref 0.0–2.0)
Basophils Absolute: 0.1 10*3/uL (ref 0.0–0.1)
EOS ABS: 0.1 10*3/uL (ref 0.0–0.5)
EOS%: 5 % (ref 0.0–7.0)
HEMATOCRIT: 37 % (ref 34.8–46.6)
HEMOGLOBIN: 11.8 g/dL (ref 11.6–15.9)
LYMPH%: 24.4 % (ref 14.0–49.7)
MCH: 29.4 pg (ref 25.1–34.0)
MCHC: 31.9 g/dL (ref 31.5–36.0)
MCV: 92.2 fL (ref 79.5–101.0)
MONO#: 0.4 10*3/uL (ref 0.1–0.9)
MONO%: 14.6 % — ABNORMAL HIGH (ref 0.0–14.0)
NEUT%: 53.5 % (ref 38.4–76.8)
NEUTROS ABS: 1.4 10*3/uL — AB (ref 1.5–6.5)
Platelets: 307 10*3/uL (ref 145–400)
RBC: 4.02 10*6/uL (ref 3.70–5.45)
RDW: 17.7 % — AB (ref 11.2–14.5)
WBC: 2.5 10*3/uL — ABNORMAL LOW (ref 3.9–10.3)
lymph#: 0.6 10*3/uL — ABNORMAL LOW (ref 0.9–3.3)

## 2014-09-13 LAB — COMPREHENSIVE METABOLIC PANEL (CC13)
ALT: 28 U/L (ref 0–55)
ANION GAP: 10 meq/L (ref 3–11)
AST: 27 U/L (ref 5–34)
Albumin: 3.7 g/dL (ref 3.5–5.0)
Alkaline Phosphatase: 73 U/L (ref 40–150)
BILIRUBIN TOTAL: 0.46 mg/dL (ref 0.20–1.20)
BUN: 15.3 mg/dL (ref 7.0–26.0)
CO2: 26 mEq/L (ref 22–29)
CREATININE: 0.9 mg/dL (ref 0.6–1.1)
Calcium: 9.6 mg/dL (ref 8.4–10.4)
Chloride: 107 mEq/L (ref 98–109)
EGFR: 81 mL/min/{1.73_m2} — AB (ref >90)
Glucose: 104 mg/dl (ref 70–140)
Potassium: 3.9 mEq/L (ref 3.5–5.1)
SODIUM: 143 meq/L (ref 136–145)
Total Protein: 7.2 g/dL (ref 6.4–8.3)

## 2014-09-13 NOTE — Progress Notes (Signed)
Redraw - sample from 3/23 was contaminated.

## 2014-09-14 ENCOUNTER — Ambulatory Visit
Admission: RE | Admit: 2014-09-14 | Discharge: 2014-09-14 | Disposition: A | Discharge status: Home / Self Care | Payer: Medicare Other | Source: Ambulatory Visit | Attending: Radiation Oncology | Admitting: Radiation Oncology

## 2014-09-14 ENCOUNTER — Encounter: Payer: Self-pay | Admitting: Radiation Oncology

## 2014-09-14 VITALS — BP 123/61 | HR 72 | Temp 98.2°F | Resp 16 | Wt 242.0 lb

## 2014-09-14 DIAGNOSIS — Z510 Encounter for antineoplastic radiation therapy: Secondary | ICD-10-CM | POA: Diagnosis not present

## 2014-09-14 DIAGNOSIS — L599 Disorder of the skin and subcutaneous tissue related to radiation, unspecified: Secondary | ICD-10-CM | POA: Insufficient documentation

## 2014-09-14 DIAGNOSIS — Z7982 Long term (current) use of aspirin: Secondary | ICD-10-CM | POA: Diagnosis not present

## 2014-09-14 DIAGNOSIS — C50512 Malignant neoplasm of lower-outer quadrant of left female breast: Secondary | ICD-10-CM | POA: Insufficient documentation

## 2014-09-14 DIAGNOSIS — Z90710 Acquired absence of both cervix and uterus: Secondary | ICD-10-CM | POA: Insufficient documentation

## 2014-09-14 DIAGNOSIS — Z791 Long term (current) use of non-steroidal anti-inflammatories (NSAID): Secondary | ICD-10-CM | POA: Insufficient documentation

## 2014-09-14 MED ORDER — RADIAPLEXRX EX GEL
Freq: Once | CUTANEOUS | Status: AC
  Administered 2014-09-14: 11:00:00 via TOPICAL

## 2014-09-14 NOTE — Progress Notes (Signed)
Weekly rad txs left breast, mild erythema only on breast,skin intact, using radiaplex bid, occasional twinges in left breast 10:25 AM

## 2014-09-14 NOTE — Progress Notes (Signed)
Complex simulation note  Diagnosis: Left-sided breast cancer  Narrative The patient has initially been planned to receive a course of whole breast radiation to a dose of 42.5 Gy in 17 fractions. The patient will now receive an additional boost to the seroma cavity which has been contoured. This will correspond to a boost of 7.5 Gy at 2.5 Gy per fraction. To accomplish this, an additional 3 customized blocks have been designed for this purpose. A complex isodose plan is requested to ensure that the target area is adequately covered with radiation dose and that the nearby normal structures such as the lung are adequately spared. The patient's final total dose will be 50 Gy.  ------------------------------------------------  Monica [name] S. Leonila Speranza, MD, PhD 

## 2014-09-14 NOTE — Progress Notes (Signed)
   Department of Radiation Oncology  Phone:  [phone] Fax:        [phone]  Weekly Treatment Note    Name: Monica [name] Date: 09/14/2014 MRN: [id number] DOB: Oct 28, 1945   Current dose: 37.5 Gy  Current fraction: 15   MEDICATIONS: Current Outpatient Prescriptions  Medication Sig Dispense Refill  . Alum & Mag Hydroxide-Simeth (MAGIC MOUTHWASH) SOLN Take 5-10 mLs by mouth 4 (four) times daily.    [name] amLODipine (NORVASC) 5 MG tablet Take 5 mg by mouth daily.    . Cholecalciferol (VITAMIN D3) 2000 UNITS TABS Take 1 tablet by mouth daily.    [name] dextromethorphan-guaiFENesin (MUCINEX DM) 30-600 MG per 12 hr tablet Take 1 tablet by mouth 2 (two) times daily.    [name] docusate sodium (COLACE) 100 MG capsule Take 100 mg by mouth 2 (two) times daily.    . Ginkgo Biloba 120 MG CAPS Take 1 capsule by mouth daily.    . hyaluronate sodium (RADIAPLEXRX) GEL Apply 1 application topically 2 (two) times daily. 2nd tube given 09/14/14    . ibuprofen (ADVIL,MOTRIN) 200 MG tablet Take 400 mg by mouth daily.    [name] lidocaine-prilocaine (EMLA) cream     . Melatonin 5 MG TABS Take 1 tablet by mouth at bedtime as needed (sleep).    . meloxicam (MOBIC) 15 MG tablet Take 15 mg by mouth daily.    . non-metallic deodorant Jethro Poling) MISC Apply 1 application topically daily as needed.    . potassium chloride (MICRO-K) 10 MEQ CR capsule Take 2 capsules (20 mEq total) by mouth daily. 120 capsule 1  . valsartan-hydrochlorothiazide (DIOVAN-HCT) 320-25 MG per tablet Take 1 tablet by mouth daily.     No current facility-administered medications for this encounter.     ALLERGIES: Codeine   LABORATORY DATA:  Lab Results  Component Value Date   WBC 2.5* 09/13/2014   HGB 11.8 09/13/2014   HCT 37.0 09/13/2014   MCV 92.2 09/13/2014   PLT 307 09/13/2014   Lab Results  Component Value Date   NA 143 09/13/2014   K 3.9 09/13/2014   CL 106 04/18/2014   CO2 26 09/13/2014   Lab Results  Component Value Date     ALT 28 09/13/2014   AST 27 09/13/2014   ALKPHOS 73 09/13/2014   BILITOT 0.46 09/13/2014     NARRATIVE: Monica [name] was seen today for weekly treatment management. The chart was checked and the patient's films were reviewed.  The patient is doing excellent. Some fatigue. The skin has felt very good with no new complaints.  PHYSICAL EXAMINATION: weight is 242 lb (109.77 kg). Her oral temperature is 98.2 F (36.8 C). Her blood pressure is 123/61 and her pulse is 72. Her respiration is 16.      mild hyperpigmentation present. No desquamation.  ASSESSMENT: The patient is doing satisfactorily with treatment.  PLAN: We will continue with the patient's radiation treatment as planned.

## 2014-09-17 ENCOUNTER — Ambulatory Visit: Payer: Medicare Other | Admitting: Nutrition

## 2014-09-17 ENCOUNTER — Ambulatory Visit
Admission: RE | Admit: 2014-09-17 | Discharge: 2014-09-17 | Disposition: A | Discharge status: Home / Self Care | Payer: Medicare Other | Source: Ambulatory Visit | Attending: Radiation Oncology | Admitting: Radiation Oncology

## 2014-09-17 DIAGNOSIS — Z510 Encounter for antineoplastic radiation therapy: Secondary | ICD-10-CM | POA: Diagnosis not present

## 2014-09-17 NOTE — Progress Notes (Signed)
69 year old female diagnosed with breast cancer receiving radiation therapy.  She is a patient of Dr. Jana Hakim.  Past medical history includes allergies and hypertension.  Medications include Magic mouthwash, vitamin D 3, Colace, ginkgo biloba, melatonin.  Labs were reviewed.  Height: 66 inches. Weight: 242 pounds. Usual body weight: 250 pounds. BMI: 39.03.  Patient reports she has experienced taste alterations throughout treatment. She has increased fatigue. Patient concerned with weight. She reports she typically eats one meal a day.  Nutrition diagnosis: Food and nutrition related knowledge deficit related to breast cancer and associated treatments as evidenced by no prior need for nutrition related information.  Intervention: Patient educated to consume smaller more frequent meals and snacks consuming plant-based diet which is low in fat. Encouraged patient to consume increased protein containing foods and provided fact sheet. Educated patient on strategies for improving taste alterations. Encouraged slow safe weight loss, and invited patient to attend class on healthy eating. Questions were answered and teach back method used.  Monitoring, evaluation, goals: Patient will work to increase frequencies of meals and snacks while consuming healthy plant-based diet to promote safe weight loss.  No follow-up required.  Patient has my contact information.   **Disclaimer: This note was dictated with voice recognition software. Similar sounding words can inadvertently be transcribed and this note may contain transcription errors which may not have been corrected upon publication of note.**

## 2014-09-18 ENCOUNTER — Ambulatory Visit
Admission: RE | Admit: 2014-09-18 | Discharge: 2014-09-18 | Disposition: A | Discharge status: Home / Self Care | Payer: Medicare Other | Source: Ambulatory Visit | Attending: Radiation Oncology | Admitting: Radiation Oncology

## 2014-09-18 DIAGNOSIS — Z510 Encounter for antineoplastic radiation therapy: Secondary | ICD-10-CM | POA: Diagnosis not present

## 2014-09-19 ENCOUNTER — Ambulatory Visit
Admission: RE | Admit: 2014-09-19 | Discharge: 2014-09-19 | Disposition: A | Discharge status: Home / Self Care | Payer: Medicare Other | Source: Ambulatory Visit | Attending: Radiation Oncology | Admitting: Radiation Oncology

## 2014-09-19 ENCOUNTER — Encounter: Payer: Self-pay | Admitting: Radiation Oncology

## 2014-09-19 VITALS — BP 140/75 | HR 72 | Temp 97.5°F | Resp 20 | Wt 242.9 lb

## 2014-09-19 DIAGNOSIS — Z510 Encounter for antineoplastic radiation therapy: Secondary | ICD-10-CM | POA: Diagnosis not present

## 2014-09-19 DIAGNOSIS — C50512 Malignant neoplasm of lower-outer quadrant of left female breast: Secondary | ICD-10-CM

## 2014-09-19 NOTE — Progress Notes (Signed)
   Department of Radiation Oncology  Phone:  [phone] Fax:        [phone]  Weekly Treatment Note    Name: Monica [name] Date: 09/19/2014 MRN: [id number] DOB: June 26, 1945   Current dose: 45Gy  Current fraction: 18   MEDICATIONS: Current Outpatient Prescriptions  Medication Sig Dispense Refill  . Alum & Mag Hydroxide-Simeth (MAGIC MOUTHWASH) SOLN Take 5-10 mLs by mouth 4 (four) times daily.    [name] amLODipine (NORVASC) 5 MG tablet Take 5 mg by mouth daily.    . Cholecalciferol (VITAMIN D3) 2000 UNITS TABS Take 1 tablet by mouth daily.    [name] dextromethorphan-guaiFENesin (MUCINEX DM) 30-600 MG per 12 hr tablet Take 1 tablet by mouth 2 (two) times daily.    [name] docusate sodium (COLACE) 100 MG capsule Take 100 mg by mouth 2 (two) times daily.    . Ginkgo Biloba 120 MG CAPS Take 1 capsule by mouth daily.    . hyaluronate sodium (RADIAPLEXRX) GEL Apply 1 application topically 2 (two) times daily. 2nd tube given 09/14/14    . ibuprofen (ADVIL,MOTRIN) 200 MG tablet Take 400 mg by mouth daily.    [name] lidocaine-prilocaine (EMLA) cream     . Melatonin 5 MG TABS Take 1 tablet by mouth at bedtime as needed (sleep).    . meloxicam (MOBIC) 15 MG tablet Take 15 mg by mouth daily.    . non-metallic deodorant Jethro Poling) MISC Apply 1 application topically daily as needed.    . potassium chloride (MICRO-K) 10 MEQ CR capsule Take 2 capsules (20 mEq total) by mouth daily. 120 capsule 1  . valsartan-hydrochlorothiazide (DIOVAN-HCT) 320-25 MG per tablet Take 1 tablet by mouth daily.     No current facility-administered medications for this encounter.     ALLERGIES: Codeine   LABORATORY DATA:  Lab Results  Component Value Date   WBC 2.5* 09/13/2014   HGB 11.8 09/13/2014   HCT 37.0 09/13/2014   MCV 92.2 09/13/2014   PLT 307 09/13/2014   Lab Results  Component Value Date   NA 143 09/13/2014   K 3.9 09/13/2014   CL 106 04/18/2014   CO2 26 09/13/2014   Lab Results  Component Value Date   ALT 28 09/13/2014   AST 27 09/13/2014   ALKPHOS 73 09/13/2014   BILITOT 0.46 09/13/2014     NARRATIVE: Monica [name] was seen today for weekly treatment management. The chart was checked and the patient's films were reviewed.   No new complaints. Energy is good  PHYSICAL EXAMINATION: weight is 242 lb 14.4 oz (110.179 kg). Her oral temperature is 97.5 F (36.4 C). Her blood pressure is 140/75 and her pulse is 72. Her respiration is 20.      Left breast mild hyperpigmentation present. No desquamation.  ASSESSMENT: The patient is doing satisfactorily with treatment.  PLAN: We will continue with the patient's radiation treatment as planned.  1 mo f/u card given  -----------------------------------  Eppie Gibson, MD

## 2014-09-19 NOTE — Progress Notes (Addendum)
Weekly rad txs 18/20  Completed, mild tanning left breast  Skin intact, using rdiaplex bid, occasional sharp pains in breast,resolves quickly,nipple very tender sensitive, appetite good, energy level getting better, 1 month f/u appr card given 10:36 AM

## 2014-09-20 ENCOUNTER — Ambulatory Visit
Admission: RE | Admit: 2014-09-20 | Discharge: 2014-09-20 | Disposition: A | Discharge status: Home / Self Care | Payer: Medicare Other | Source: Ambulatory Visit | Attending: Radiation Oncology | Admitting: Radiation Oncology

## 2014-09-20 DIAGNOSIS — Z510 Encounter for antineoplastic radiation therapy: Secondary | ICD-10-CM | POA: Diagnosis not present

## 2014-09-21 ENCOUNTER — Ambulatory Visit
Admission: RE | Admit: 2014-09-21 | Discharge: 2014-09-21 | Disposition: A | Discharge status: Home / Self Care | Payer: Medicare Other | Source: Ambulatory Visit | Attending: Radiation Oncology | Admitting: Radiation Oncology

## 2014-09-21 ENCOUNTER — Encounter: Payer: Self-pay | Admitting: Radiation Oncology

## 2014-09-21 DIAGNOSIS — Z510 Encounter for antineoplastic radiation therapy: Secondary | ICD-10-CM | POA: Diagnosis not present

## 2014-10-02 ENCOUNTER — Encounter: Payer: Self-pay | Admitting: Radiation Oncology

## 2014-10-11 ENCOUNTER — Ambulatory Visit
Admission: RE | Admit: 2014-10-11 | Discharge: 2014-10-11 | Disposition: A | Discharge status: Home / Self Care | Payer: Medicare Other | Source: Ambulatory Visit | Attending: Radiation Oncology | Admitting: Radiation Oncology

## 2014-10-11 ENCOUNTER — Encounter: Payer: Self-pay | Admitting: Radiation Oncology

## 2014-10-11 VITALS — BP 129/77 | HR 77 | Temp 98.2°F | Wt 245.2 lb

## 2014-10-11 DIAGNOSIS — C50512 Malignant neoplasm of lower-outer quadrant of left female breast: Secondary | ICD-10-CM

## 2014-10-11 HISTORY — DX: Personal history of irradiation: Z92.3

## 2014-10-11 NOTE — Progress Notes (Signed)
One month follow up radiation to left breast.Denies pain.Skin is still dark.Continue application of radiaplex for 2 weeks then try lotion with vitamin e.Porta cath removed on Monday.Overall doing well except fatigue.

## 2014-10-11 NOTE — Progress Notes (Signed)
Radiation Oncology         [phone] ________________________________  Name: Monica [name] MRN: [id number]  Date: 10/11/2014  DOB: 1945/12/12  Follow-Up Visit Note  CC: Vena Austria, MD  Maury Dus, MD  Diagnosis:   Left-sided breast cancer  Interval Since Last Radiation:  Approximately one month   Narrative:  The patient returns today for routine follow-up.  She has done well overall since she finished treatment. The patient's skin has healed significantly since she completed her course of radiation treatment. She has not begun anti-hormonal treatment.  The patient was receptor negative although medical oncology is going to discuss this further with the patient at their next visit.                            ALLERGIES:  is allergic to codeine.  Meds: Current Outpatient Prescriptions  Medication Sig Dispense Refill  . Alum & Mag Hydroxide-Simeth (MAGIC MOUTHWASH) SOLN Take 5-10 mLs by mouth 4 (four) times daily.    [name] amLODipine (NORVASC) 5 MG tablet Take 5 mg by mouth daily.    . Cholecalciferol (VITAMIN D3) 2000 UNITS TABS Take 1 tablet by mouth daily.    [name] dextromethorphan-guaiFENesin (MUCINEX DM) 30-600 MG per 12 hr tablet Take 1 tablet by mouth 2 (two) times daily.    [name] docusate sodium (COLACE) 100 MG capsule Take 100 mg by mouth 2 (two) times daily.    . Ginkgo Biloba 120 MG CAPS Take 1 capsule by mouth daily.    . hyaluronate sodium (RADIAPLEXRX) GEL Apply 1 application topically 2 (two) times daily. 2nd tube given 09/14/14    . ibuprofen (ADVIL,MOTRIN) 200 MG tablet Take 400 mg by mouth daily.    . Melatonin 5 MG TABS Take 1 tablet by mouth at bedtime as needed (sleep).    . meloxicam (MOBIC) 15 MG tablet Take 15 mg by mouth daily.    . potassium chloride (MICRO-K) 10 MEQ CR capsule Take 2 capsules (20 mEq total) by mouth daily. 120 capsule 1  . valsartan-hydrochlorothiazide (DIOVAN-HCT) 320-25 MG per tablet Take 1 tablet by mouth daily.    . non-metallic  deodorant Jethro Poling) MISC Apply 1 application topically daily as needed.     No current facility-administered medications for this encounter.    Physical Findings: The patient is in no acute distress. Patient is alert and oriented.  weight is 245 lb 3.2 oz (111.222 kg). Her temperature is 98.2 F (36.8 C). Her blood pressure is 129/77 and her pulse is 77. .   The skin in the treatment area has healed satisfactorily, no areas of concern/moist desquamation/poor healing  Lab Findings: Lab Results  Component Value Date   WBC 2.5* 09/13/2014   HGB 11.8 09/13/2014   HCT 37.0 09/13/2014   MCV 92.2 09/13/2014   PLT 307 09/13/2014     Radiographic Findings: No results found.  Impression:    The patient has done satisfactorily since finishing treatment.   Plan:  The patient will followup in our clinic on a when necessary basis.   Jodelle Gross, M.D., Ph.D.

## 2014-10-12 NOTE — Progress Notes (Signed)
  Radiation Oncology         [phone]) [phone] ________________________________  Name: Monica [name] MRN: [id number]  Date: 09/21/2014  DOB: 1946-05-02  End of Treatment Note  Diagnosis:   Left-sided breast cancer     Indication for treatment:  Curative       Radiation treatment dates:   08/27/2014 through 09/21/2014  Site/dose:   The patient initially received a dose of 42.5 Gy in 17 fractions to the left breast using whole-breast tangent fields. This was delivered using a 3-D conformal technique. The patient then received a boost to the seroma. This delivered an additional 7.5 Gy in 3 fractions using a 3 field photon technique due to the depth of the seroma. The total dose was 50 Gy.  Narrative: The patient tolerated radiation treatment relatively well.   The patient had some expected skin irritation as she progressed during treatment. Moist desquamation was not present at the end of treatment.  Plan: The patient has completed radiation treatment. The patient will return to radiation oncology clinic for routine followup in one month. I advised the patient to call or return sooner if they have any questions or concerns related to their recovery or treatment. ________________________________  Jodelle Gross, M.D., Ph.D.

## 2014-10-24 ENCOUNTER — Other Ambulatory Visit: Payer: Self-pay | Admitting: *Deleted

## 2014-10-24 ENCOUNTER — Telehealth: Payer: Self-pay | Admitting: *Deleted

## 2014-10-24 DIAGNOSIS — C50512 Malignant neoplasm of lower-outer quadrant of left female breast: Secondary | ICD-10-CM

## 2014-10-24 DIAGNOSIS — R200 Anesthesia of skin: Secondary | ICD-10-CM

## 2014-10-24 DIAGNOSIS — R52 Pain, unspecified: Secondary | ICD-10-CM

## 2014-10-24 DIAGNOSIS — M25559 Pain in unspecified hip: Secondary | ICD-10-CM

## 2014-10-24 MED ORDER — GABAPENTIN 300 MG PO CAPS: 300.0000 mg | ORAL_CAPSULE | Freq: Every day | ORAL | Status: DC

## 2014-10-24 NOTE — Telephone Encounter (Signed)
TC from patient regarding ongoing issues with pain in her buttocks-this has not settled down since last appt. She is wanting to know if this is related to her chemo. If not, she would like to know who to see about the pain. She has not been able to exercise d/t the pain in buttocks and overall joint pain. She continues on daily meloxicam as well as Advil and aleve. Instructed pt not to take all 3 NSAID's but to stay with Meloxicam and use Tylenol as needed. She is very concerned that pain has not subsided at all. Also she is asking to be excused from [employer] in June as she does not think she can sit through that.

## 2014-10-24 NOTE — Telephone Encounter (Signed)
This RN returned call to pt to discuss pain further-   Chenika states pain is " at it's worse when I wake up "  She states she " can barely walk " and gave a description to how she sits for 30-45 minutes on vanity bench and slowly moves her legs and back to be able to walk.  She states pain is in her buttocks with left greater then right with description of pain " like numbness, but also like you can feel something like worms moving all in the area of discomfort "  She describes pain in am as " excruciating ".  Pain is less interfering during the day " but still noticeable ".  Per discussion with MD plan is to obtain an MRI of lumbar spine as well as start gabapentin at night.  All the above discussed with the patient including known side effects of transient sleepiness with gabapentin.  Ly will call this RN in few days with update per use of gabapentin.

## 2014-10-29 ENCOUNTER — Telehealth: Payer: Self-pay

## 2014-10-29 NOTE — Telephone Encounter (Signed)
Pt called stating she was waiting for a letter to be released from jury duty.

## 2014-10-30 ENCOUNTER — Telehealth: Payer: Self-pay | Admitting: *Deleted

## 2014-10-30 NOTE — Telephone Encounter (Signed)
VM message from patient regarding jury duty release letter. Pt. 1st called about this on 10/24/14. Pt. Can be reached @ [phone]

## 2014-10-31 ENCOUNTER — Encounter: Payer: Self-pay | Admitting: *Deleted

## 2014-10-31 NOTE — Telephone Encounter (Signed)
Obtained jury information- letter obtained and placed in out going mail to patient to send to pt.  Pt will forward to the [place] of courts with jury summons.

## 2014-11-08 ENCOUNTER — Ambulatory Visit (HOSPITAL_COMMUNITY)
Admission: RE | Admit: 2014-11-08 | Discharge: 2014-11-08 | Disposition: A | Discharge status: Home / Self Care | Payer: Medicare Other | Source: Ambulatory Visit | Attending: Oncology | Admitting: Oncology

## 2014-11-08 DIAGNOSIS — R52 Pain, unspecified: Secondary | ICD-10-CM

## 2014-11-08 DIAGNOSIS — M25559 Pain in unspecified hip: Secondary | ICD-10-CM

## 2014-11-08 DIAGNOSIS — M4806 Spinal stenosis, lumbar region: Secondary | ICD-10-CM | POA: Insufficient documentation

## 2014-11-08 DIAGNOSIS — M545 Low back pain: Secondary | ICD-10-CM | POA: Insufficient documentation

## 2014-11-08 DIAGNOSIS — Z853 Personal history of malignant neoplasm of breast: Secondary | ICD-10-CM | POA: Insufficient documentation

## 2014-11-08 DIAGNOSIS — R200 Anesthesia of skin: Secondary | ICD-10-CM

## 2014-11-08 DIAGNOSIS — C50512 Malignant neoplasm of lower-outer quadrant of left female breast: Secondary | ICD-10-CM

## 2014-11-08 LAB — POCT I-STAT CREATININE: Creatinine, Ser: 0.9 mg/dL (ref 0.44–1.00)

## 2014-11-08 MED ORDER — GADOBENATE DIMEGLUMINE 529 MG/ML IV SOLN
20.0000 mL | Freq: Once | INTRAVENOUS | Status: AC | PRN
  Administered 2014-11-08: 20 mL via INTRAVENOUS

## 2014-11-14 ENCOUNTER — Encounter: Payer: Self-pay | Admitting: Oncology

## 2014-11-14 NOTE — Progress Notes (Signed)
I placed [employer] form on desk of nurse for dr. Jana Hakim

## 2014-11-16 ENCOUNTER — Other Ambulatory Visit: Payer: Self-pay | Admitting: Oncology

## 2014-11-20 ENCOUNTER — Other Ambulatory Visit: Payer: Self-pay | Admitting: Oncology

## 2014-11-20 ENCOUNTER — Encounter: Payer: Self-pay | Admitting: Oncology

## 2014-11-20 NOTE — Progress Notes (Unsigned)
I called Monica [name] with the results of her MRI, which showed no cancer in her spine or they do show significant degenerative disc disease and spinal stenosis. She will be discussing those results with her primary care physician.

## 2014-11-20 NOTE — Progress Notes (Signed)
I faxed medical clearance form to  [phone]-- [employer]

## 2014-12-12 ENCOUNTER — Telehealth: Payer: Self-pay | Admitting: *Deleted

## 2014-12-12 NOTE — Telephone Encounter (Signed)
This RN returned call to both numbers and obtained VM for each.  Message left on both numbers to return call.  Requested further information per her return call including-  How she has had the swelling- Pain or redness-  Is she wearing her compression sleeve for exercise And if she has been to the lymphedema clinic- may need to be referred.  Office number given for return call.

## 2014-12-12 NOTE — Telephone Encounter (Signed)
TC from patient this am stating that she noticed some swelling in her left arm while working out (weight lifting with [employer]).  She wanted to make sure it is ok to continue her workouts, did she need a sleeve for possible lymphedema. Swelling is localized in upper arm-not in hand/fingers. She has an appointment with Dr. Jana Hakim on 12/25/14  She can be reached @ [phone] or [phone]

## 2014-12-12 NOTE — Telephone Encounter (Signed)
Return call from patient with more information re: left arm swelling.  The swelling is on her left arm just below her elbow on her lower portion of her arm and in the crease of her elbow. There is no redness, or pain. She noticed it for the first time last night during and after lifting weights. No other areas of swelling. Pt also states she does not perspire under her arms anymore, nor has any odor under her arms. Wants to know if that is a problem.

## 2014-12-18 ENCOUNTER — Other Ambulatory Visit (HOSPITAL_BASED_OUTPATIENT_CLINIC_OR_DEPARTMENT_OTHER): Payer: Medicare Other

## 2014-12-18 ENCOUNTER — Telehealth: Payer: Self-pay | Admitting: *Deleted

## 2014-12-18 ENCOUNTER — Other Ambulatory Visit: Payer: Self-pay | Admitting: *Deleted

## 2014-12-18 DIAGNOSIS — C50512 Malignant neoplasm of lower-outer quadrant of left female breast: Secondary | ICD-10-CM | POA: Diagnosis not present

## 2014-12-18 LAB — CBC WITH DIFFERENTIAL/PLATELET
BASO%: 1.3 % (ref 0.0–2.0)
BASOS ABS: 0 10*3/uL (ref 0.0–0.1)
EOS%: 3.4 % (ref 0.0–7.0)
Eosinophils Absolute: 0.1 10*3/uL (ref 0.0–0.5)
HEMATOCRIT: 38.7 % (ref 34.8–46.6)
HEMOGLOBIN: 12.7 g/dL (ref 11.6–15.9)
LYMPH#: 1.2 10*3/uL (ref 0.9–3.3)
LYMPH%: 36.6 % (ref 14.0–49.7)
MCH: 27.4 pg (ref 25.1–34.0)
MCHC: 32.8 g/dL (ref 31.5–36.0)
MCV: 83.5 fL (ref 79.5–101.0)
MONO#: 0.3 10*3/uL (ref 0.1–0.9)
MONO%: 9.5 % (ref 0.0–14.0)
NEUT#: 1.6 10*3/uL (ref 1.5–6.5)
NEUT%: 49.2 % (ref 38.4–76.8)
Platelets: 298 10*3/uL (ref 145–400)
RBC: 4.64 10*6/uL (ref 3.70–5.45)
RDW: 15.9 % — ABNORMAL HIGH (ref 11.2–14.5)
WBC: 3.2 10*3/uL — AB (ref 3.9–10.3)

## 2014-12-18 LAB — COMPREHENSIVE METABOLIC PANEL (CC13)
ALK PHOS: 82 U/L (ref 40–150)
ALT: 28 U/L (ref 0–55)
AST: 25 U/L (ref 5–34)
Albumin: 3.5 g/dL (ref 3.5–5.0)
Anion Gap: 7 mEq/L (ref 3–11)
BUN: 19.2 mg/dL (ref 7.0–26.0)
CO2: 29 mEq/L (ref 22–29)
Calcium: 9.7 mg/dL (ref 8.4–10.4)
Chloride: 106 mEq/L (ref 98–109)
Creatinine: 0.8 mg/dL (ref 0.6–1.1)
EGFR: 86 mL/min/{1.73_m2} — ABNORMAL LOW (ref >90)
Glucose: 99 mg/dl (ref 70–140)
POTASSIUM: 3.5 meq/L (ref 3.5–5.1)
SODIUM: 141 meq/L (ref 136–145)
TOTAL PROTEIN: 7 g/dL (ref 6.4–8.3)
Total Bilirubin: 0.48 mg/dL (ref 0.20–1.20)

## 2014-12-18 NOTE — Progress Notes (Signed)
This RN spoke with pt per her return call regarding concerns with swelling in left arm post exercising.  Monica [name] states she is only lifting " 10 lbs ".  Swelling is not worse- just ongoing. No redness or focal pain.  Per discussion this RN informed pt to currently avoid lifting exercises- she may continue other exercises such as treadmill- zumba etc.  Referral placed for lymphedema clinic.

## 2014-12-18 NOTE — Telephone Encounter (Signed)
DR.MAGRINAT'S NURSE, VAL DODD,RN WILL CALL PT. CONCERNING WEIGHT LIFTING AND THE NEED TO WEAR A COMPRESSION SLEEVE.

## 2014-12-25 ENCOUNTER — Ambulatory Visit: Payer: Medicare Other | Admitting: Oncology

## 2014-12-26 ENCOUNTER — Ambulatory Visit: Payer: Medicare Other | Attending: Oncology | Admitting: Physical Therapy

## 2014-12-26 ENCOUNTER — Ambulatory Visit: Payer: Medicare Other | Admitting: Physical Therapy

## 2014-12-26 DIAGNOSIS — Z9189 Other specified personal risk factors, not elsewhere classified: Secondary | ICD-10-CM

## 2014-12-26 NOTE — Therapy (Signed)
[place], [place], [address] Phone: [phone]   Fax:  [phone]  Physical Therapy Evaluation  Patient Details  Name: Monica [name] MRN: [id number] Date of Birth: [birth year] Referring Provider:  Chauncey Cruel, MD  Encounter Date: 12/26/2014      PT End of Session - 12/26/14 1558    Visit Number 1   Number of Visits 1   PT Start Time 7948   PT Stop Time 1543   PT Time Calculation (min) 58 min   Activity Tolerance Patient tolerated treatment well   Behavior During Therapy [facility] for tasks assessed/performed      Past Medical History  Diagnosis Date  . Allergy codeine    rapid heart beat, cold sweat  . Arthritis   . Hypertension   . Breast cancer 01/10/14    Left breast  . S/P radiation therapy 09/21/14 completed    left breast    Past Surgical History  Procedure Laterality Date  . Knee arthroscopy  2012    right  . Lumbar laminectomy  1991  . Colonoscopy    . Foot arthroplasty  1998    right  . Abdominal hysterectomy  1992  . Breast lumpectomy with needle localization and axillary sentinel lymph node bx Left 02/12/2014    Procedure: LEFT BREAST LUMPECTOMY WITH NEEDLE LOCALIZATION AND LEFT AXILLARY SENTINEL LYMPH NODE BX;  Surgeon: [name], MD;  Location: [place];  Service: General;  Laterality: Left;  . Portacath placement Right 02/12/2014    Procedure: INSERTION PORT-A-CATH;  Surgeon: [name], MD;  Location: [place];  Service: General;  Laterality: Right;    There were no vitals filed for this visit.  Visit Diagnosis:  At risk for lymphedema - Plan: PT plan of care cert/re-cert      Subjective Assessment - 12/26/14 1458    Pertinent History Left breast cancer with lumpectomy 02/12/14, 2 lymph nodes removed, with adjuvant chemo and then 20 radiation treatments, completed 09/21/14.  Now has [facility] and noticed some arm swelling after that.    h/o right knee arthroscopy 11/03/10--now taking synvisc injections; lumbar laminectomy 07/30/89; hysterectomy 1992; right foot nerve tumor 1998.  HTN contolled with meds.                                         Patient Stated Goals would rather be safe than sorry with the possibility of lymphedema            [facility] PT Assessment - 12/26/14 0001    Assessment   Medical Diagnosis left breast cancer with lumpectomy   Onset Date/Surgical Date 02/12/14   Precautions   Precautions Other (comment)  cancer precautions; neuropathy   Restrictions   Weight Bearing Restrictions No   Balance Screen   Has the patient fallen in the past 6 months No   Has the patient had a decrease in activity level because of a fear of falling?  No   Is the patient reluctant to leave their home because of a fear of falling?  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   Leisure [facility] [facility] [employer]; husband is a Microbiologist intact two incision areas, well-healed   Quick DASH  32   Posture/Postural Control   Posture/Postural Control No significant limitations   ROM / Strength   AROM / PROM / Strength AROM;Strength   AROM   Overall AROM Comments Both shoulders WFL with mild limitations, but patient denies functional limitations   Strength   Overall Strength Within functional limits for tasks performed   Overall Strength Comments both shoulders, gross assessment   Ambulation/Gait   Ambulation/Gait Yes   Ambulation/Gait Assistance 7: Independent           LYMPHEDEMA/ONCOLOGY QUESTIONNAIRE - 12/26/14 1514    Type   Cancer Type left breast    Surgeries    Lumpectomy Date 02/12/14   Treatment   Past Chemotherapy Treatment Yes   Date 08/06/14  approx.   Past Radiation Treatment Yes   Date 09/21/14   What other symptoms do you have   Are you having Pain Yes   Are you having pitting edema No   Stemmer Sign No   Lymphedema Assessments   Lymphedema Assessments Upper extremities   Right Upper Extremity Lymphedema   10 cm Proximal to Olecranon Process 38.9 cm   Olecranon Process 30 cm   10 cm Proximal to Ulnar Styloid Process 25.3 cm   Just Proximal to Ulnar Styloid Process 17.7 cm   Across Hand at [employer] 21.4 cm   At [place] of 2nd Digit 6.6 cm   Left Upper Extremity Lymphedema   10 cm Proximal to Olecranon Process 37.3 cm   Olecranon Process 29.5 cm   10 cm Proximal to Ulnar Styloid Process 25.7 cm   Just Proximal to Ulnar Styloid Process 18 cm   Across Hand at [employer] 20.8 cm   At [place] of 2nd Digit 6.1 cm           Quick Dash - 12/26/14 0001    Open a tight or new jar Mild difficulty   Do heavy household chores (wash walls, wash floors) Moderate difficulty   Carry a shopping bag or briefcase Moderate difficulty   Use a knife to cut food No difficulty   Recreational activities in which you take some force or impact through your arm, shoulder, or hand (golf, hammering, tennis) Moderate difficulty   During the past week, to what extent has your arm, shoulder or hand problem interfered with your normal social activities with family, friends, neighbors, or groups? Modererately   During the past week, to what extent has your arm, shoulder or hand problem limited your work or other regular daily activities Not at all   Arm, shoulder, or hand pain. Moderate   Tingling (pins and needles) in your arm, shoulder, or hand Moderate   Difficulty Sleeping Moderate difficulty   DASH Score 31.82 %                     PT Education - 12/26/14 1557    Education provided Yes   Education Details began instruction in  lymphedema risk reduction, including where and how to obtain a compression sleeve for prophylaxis   Person(s) Educated Patient   Methods Explanation;Handout   Comprehension Verbalized understanding                [place] -  Jan 03, 2015 1602    CC Long Term Goal  #1   Title Patient will be given information about lymphedema risk reduction.   Status Achieved   CC Long Term Goal  #2   Title Patient will know where and how to obtain a compression sleeve for left UE.   Status Achieved            Plan - 2015/01/03 1559    Clinical Impression Statement Patient who recently noted some possible swelling in left UE and knew of risk for lymphedema wanted to know if she has developed swelling.  Measurements show little change from pre-treatment measurements and there is also little difference from right to left UE; Stemmer's sign is negative and there is no pitting, so it does not appear she has lymphedema.   Pt will benefit from skilled therapeutic intervention in order to improve on the following deficits Decreased knowledge of use of DME   Rehab Potential Excellent   Clinical Impairments Affecting Rehab Potential none   PT Frequency One time visit   PT Treatment/Interventions Patient/family education   PT Next Visit Plan No further therapy planned.   PT Home Exercise Plan continue [facility], but use a compression sleeve during exercise   Recommended Other Services attend ABC class for further information   Consulted and Agree with Plan of Care Patient          G-Codes - 01/03/2015 1604    Functional Assessment Tool Used quick dash   Functional Limitation Carrying, moving and handling objects   Carrying, Moving and Handling Objects Current Status (Q7341) At least 20 percent but less than 40 percent impaired, limited or restricted   Carrying, Moving and Handling Objects Goal Status (P3790) At least 1 percent but less than 20 percent impaired, limited or  restricted   Carrying, Moving and Handling Objects Discharge Status [phone]) At least 20 percent but less than 40 percent impaired, limited or restricted       Problem List Patient Active Problem List   Diagnosis Date Noted  . Other pancytopenia 06/28/2014  . Corneal abrasion 06/27/2014  . Anemia associated with chemotherapy 06/25/2014  . Thrombocytopenia 06/25/2014  . Chemotherapy-induced neuropathy 06/04/2014  . Decreased appetite 05/28/2014  . Body aches 05/28/2014  . Dyspepsia 05/28/2014  . Insomnia 05/02/2014  . UTI (urinary tract infection) 04/17/2014  . Febrile neutropenia 04/15/2014  . Hypokalemia 04/15/2014  . Hypertension 04/15/2014  . Itchy eyes 04/09/2014  . Heart palpitations 04/04/2014  . Nausea without vomiting 03/26/2014  . Heartburn 03/26/2014  . Thrush 03/26/2014  . Breast cancer of lower-outer quadrant of left female breast 01/12/2014    Carlito Bogert 2015-01-03, 4:07 PM  [place], [place], [address] Phone: [phone]   Fax:  [phone]  PHYSICAL THERAPY DISCHARGE SUMMARY  Visits from Start of Care: 1  Current functional level related to goals / functional outcomes: Goals have been met.   Remaining deficits: Knowledge deficit about lymphedema risk reduction could be improved by patient coming to After Breast Cancer class.   Education / Equipment: Lymphedema risk reduction handout by the [employer]; ABC class information handout given.  Plan: Patient agrees to discharge.  Patient goals were met. Patient is being discharged due to meeting the stated rehab goals.  ?????   Serafina Royals, PT 01-03-15 4:08 PM

## 2014-12-31 ENCOUNTER — Ambulatory Visit (HOSPITAL_BASED_OUTPATIENT_CLINIC_OR_DEPARTMENT_OTHER): Payer: Medicare Other | Admitting: Oncology

## 2014-12-31 VITALS — BP 145/66 | HR 75 | Temp 98.0°F | Resp 18 | Ht 66.0 in | Wt 244.5 lb

## 2014-12-31 DIAGNOSIS — C50512 Malignant neoplasm of lower-outer quadrant of left female breast: Secondary | ICD-10-CM | POA: Diagnosis not present

## 2014-12-31 MED ORDER — TRAMADOL HCL 50 MG PO TABS: 50.0000 mg | ORAL_TABLET | Freq: Four times a day (QID) | ORAL | Status: DC | PRN

## 2014-12-31 NOTE — Progress Notes (Signed)
[facility]  [phone] Fax:([phone]) [phone]     ID: Monica [name] DOB: October 10, 1945  MR#: [id number]  OMV#:[id number]  Patient Care Team: Monica Dus, MD as PCP - General (Family Medicine) Monica Seltzer, MD as Consulting Physician (General Surgery) Monica Cruel, MD as Consulting Physician (Oncology) Monica Rudd, MD as Consulting Physician (Radiation Oncology) Monica Fus, MD as Consulting Physician (Obstetrics and Gynecology)   CHIEF COMPLAINT: Newly diagnosed breast cancer  CURRENT TREATMENT:  observation  BREAST CANCER HISTORY: From the original intake note:  [name] showing a possible mass in her left breast. On 01/05/2014 left diagnostic mammography and ultrasonography at the breast Center showed 2 adjacent poorly defined masses deep in the lower outer portion of the left breast. On physical exam these were palpable measuring approximately 3 cm altogether, at the 4:00 position 16 cm from the left nipple. There were no palpable left axillary lymph nodes. Ultrasound confirmed to and adjacent irregularly marginated hypoechoic masses in the area in question, measuring 1.5 and 1.0 cm respectively. The total area of by ultrasonography measured 2.6 cm. In addition, to left axillary lymph nodes showed mild cortical thickening in one of them showed loss of the normal fatty hilum.  On 01/10/2014 the patient underwent separate biopsies of both the left breast masses in question as well as the more suspicious lymph node. The final pathology ([id number]) showed the lymph node to be benign. (This was interpreted as concordant). Both of the breast masses, however, were positive for an invasive ductal carcinoma, which was HER-2 not amplified, with the signals ratio of 0.98 and the number per cell being 2.45. Estrogen and progesterone receptor determination is pending but looks negative at this point.  On  01/16/2014 the patient underwent bilateral breast MRI the right breast was unremarkable. In the left breast lower outer quadrant there were 2 oval masses measuring 1.5 and 2.2 cm. Taken together they was approximately 4 cm of disease. There were no findings of concern in the right breast. In the left axilla there was a single mildly prominent lymph node. There was no suspicious internal mammary adenopathy.  The patient's subsequent history is as detailed below  INTERVAL HISTORY: Monica [name] returns today for follow up of her breast cancer accompanied by her goddaughter. Monica [name]. Since her last visit here she completed her radiation treatments. She is still moderately fatigued, but she [facility] [place] [facility] and [facility].   REVIEW OF SYSTEMS: The biggest problem she is having is pain related to her significant degenerative disc disease. In the morning when she wakes up she has to sit on the side of the bed for maybe 10-20 minutes with quite a bit of pain in her buttocks and thighs then she is able to start walking "like an old lady" and he takes her about 45 minutes to really get going. Once she "warms up" she feels better. Just sitting there for an of me if for example she is not hurting. She does have some numbness particularly over the left thigh area which is fairly constant. She also has shooting pains across the surgical breast which she understands or simply postsurgical symptoms which may or may not completely resolve. She takes gabapentin at bedtime for the leg pain and numbness and that is helping her sleep. It helps the numbness some at night. We don't do it during the day currently because she would rather be  more alert. She has not had any unusual headaches, visual changes, nausea, or vomiting. She denies cough or phlegm production. She denies any change in bowel or bladder habits at present. A detailed review of systems today was otherwise stable   PAST MEDICAL  HISTORY: Past Medical History  Diagnosis Date  . Allergy codeine    rapid heart beat, cold sweat  . Arthritis   . Hypertension   . Breast cancer 01/10/14    Left breast  . S/P radiation therapy 09/21/14 completed    left breast    PAST SURGICAL HISTORY: Past Surgical History  Procedure Laterality Date  . Knee arthroscopy  2012    right  . Lumbar laminectomy  1991  . Colonoscopy    . Foot arthroplasty  1998    right  . Abdominal hysterectomy  1992  . Breast lumpectomy with needle localization and axillary sentinel lymph node bx Left 02/12/2014    Procedure: LEFT BREAST LUMPECTOMY WITH NEEDLE LOCALIZATION AND LEFT AXILLARY SENTINEL LYMPH NODE BX;  Surgeon: Monica Jolly, MD;  Location: [facility];  Service: General;  Laterality: Left;  . Portacath placement Right 02/12/2014    Procedure: INSERTION PORT-A-CATH;  Surgeon: Monica Jolly, MD;  Location: [facility];  Service: General;  Laterality: Right;    FAMILY HISTORY Family History  Problem Relation Age of Onset  . Cancer Sister   . Cancer Brother   . Cancer Maternal Aunt   . Cancer Paternal Aunt    the patient's father died at the age of 40 from a stroke. The patient's mother died at the age of 74 from "multiple causes". The patient had 2 brothers and 2 sisters. The patient's sister was diagnosed with uterine cancer at the age of 48, and the patient's brother Monica [name] had "bone cancer" and died in his 26s. He was treated at the [facility]. There is no history of breast or ovarian cancer in the family to the patient's knowledge.  GYNECOLOGIC HISTORY:  No LMP recorded. Patient has had a hysterectomy. Menarche age 73, the patient is GX P0. She underwent hysterectomy in 1992, with no salpingo-oophorectomy. She has been on estrogen replacement since. 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 Her husband Monica [name] worked for [employer] in [place] as a Facilities manager. The patient's adopted son Monica [name] works as a laborer in [place]. The patient's god-daughter Monica [name] is an Optometrist. The patient has no grandchildren. The patient attends a local church of  [employer] [place], and her husband Monica [name] is pastor   ADVANCED DIRECTIVES: In place   HEALTH MAINTENANCE: History  Substance Use Topics  . Smoking status: Never Smoker   . Smokeless tobacco: Never Used  . Alcohol Use: No     Colonoscopy: October 2008  PAP: July 2015  Bone density: 01/01/2014  Lipid panel:  Allergies  Allergen Reactions  . Codeine Other (See Comments)    Heart fast    Current Outpatient Prescriptions  Medication Sig Dispense Refill  . Alum & Mag Hydroxide-Simeth (MAGIC MOUTHWASH) SOLN Take 5-10 mLs by mouth 4 (four) times daily.    [name] amLODipine (NORVASC) 5 MG tablet Take 5 mg by mouth daily.    . Cholecalciferol (VITAMIN D3) 2000 UNITS TABS Take 1 tablet by mouth daily.    [name] dextromethorphan-guaiFENesin (MUCINEX DM) 30-600 MG per 12  hr tablet Take 1 tablet by mouth 2 (two) times daily.    [name] docusate sodium (COLACE) 100 MG capsule Take 100 mg by mouth 2 (two) times daily.    [name] gabapentin (NEURONTIN) 300 MG capsule Take 1 capsule (300 mg total) by mouth at bedtime. 30 capsule 3  . Ginkgo Biloba 120 MG CAPS Take 1 capsule by mouth daily.    . hyaluronate sodium (RADIAPLEXRX) GEL Apply 1 application topically 2 (two) times daily. 2nd tube given 09/14/14    . ibuprofen (ADVIL,MOTRIN) 200 MG tablet Take 400 mg by mouth daily.    . Melatonin 5 MG TABS Take 1 tablet by mouth at bedtime as needed (sleep).    . meloxicam (MOBIC) 15 MG tablet Take 15 mg by mouth daily.    . non-metallic deodorant Jethro Poling) MISC Apply 1 application topically daily as needed.    . Omega-3 Fatty Acids (FISH OIL CONCENTRATE) 300 MG CAPS Take by mouth.    . potassium chloride (MICRO-K)  10 MEQ CR capsule Take 2 capsules (20 mEq total) by mouth daily. (Patient not taking: Reported on 12/26/2014) 120 capsule 1  . valsartan-hydrochlorothiazide (DIOVAN-HCT) 320-25 MG per tablet Take 1 tablet by mouth daily.     No current facility-administered medications for this visit.    OBJECTIVE: Middle-aged [place] American woman in no acute distress Filed Vitals:   12/31/14 1211  BP: 145/66  Pulse: 75  Temp: 98 F (36.7 C)  Resp: 18     Body mass index is 39.48 kg/(m^2).    ECOG FS:1 - Symptomatic but completely ambulatory  Sclerae unicteric, EOMs intact Oropharynx clear, dentition in good repair No cervical or supraclavicular adenopathy Lungs no rales or rhonchi Heart regular rate and rhythm Abd soft, nontender, positive bowel sounds MSK scoliosis but no focal spinal tenderness, no upper extremity lymphedema Neuro: nonfocal, well oriented, appropriate affect Breasts: The right breast is unremarkable. The left breast is status post radiation and lumpectomy. There is a seroma easily palpable in the lower outer quadrant. The hyperpigmentation is almost completely resolved. There is no evidence of disease recurrence. Left axilla is benign.   LAB RESULTS:  CMP     Component Value Date/Time   NA 141 12/18/2014 0914   NA 141 04/18/2014 0444   K 3.5 12/18/2014 0914   K 3.7 04/18/2014 0444   CL 106 04/18/2014 0444   CO2 29 12/18/2014 0914   CO2 24 04/18/2014 0444   GLUCOSE 99 12/18/2014 0914   GLUCOSE 127* 04/18/2014 0444   BUN 19.2 12/18/2014 0914   BUN 6 04/18/2014 0444   CREATININE 0.8 12/18/2014 0914   CREATININE 0.90 11/08/2014 0725   CALCIUM 9.7 12/18/2014 0914   CALCIUM 8.7 04/18/2014 0444   PROT 7.0 12/18/2014 0914   PROT 5.8* 04/16/2014 0515   ALBUMIN 3.5 12/18/2014 0914   ALBUMIN 2.6* 04/16/2014 0515   AST 25 12/18/2014 0914   AST 12 04/16/2014 0515   ALT 28 12/18/2014 0914   ALT 15 04/16/2014 0515   ALKPHOS 82 12/18/2014 0914   ALKPHOS 90 04/16/2014 0515     BILITOT 0.48 12/18/2014 0914   BILITOT 0.5 04/16/2014 0515   GFRNONAA >90 04/18/2014 0444   GFRAA >90 04/18/2014 0444    I No results found for: SPEP  Lab Results  Component Value Date   WBC 3.2* 12/18/2014   NEUTROABS 1.6 12/18/2014   HGB 12.7 12/18/2014   HCT 38.7 12/18/2014   MCV 83.5 12/18/2014   PLT 298 12/18/2014  Chemistry      Component Value Date/Time   NA 141 12/18/2014 0914   NA 141 04/18/2014 0444   K 3.5 12/18/2014 0914   K 3.7 04/18/2014 0444   CL 106 04/18/2014 0444   CO2 29 12/18/2014 0914   CO2 24 04/18/2014 0444   BUN 19.2 12/18/2014 0914   BUN 6 04/18/2014 0444   CREATININE 0.8 12/18/2014 0914   CREATININE 0.90 11/08/2014 0725      Component Value Date/Time   CALCIUM 9.7 12/18/2014 0914   CALCIUM 8.7 04/18/2014 0444   ALKPHOS 82 12/18/2014 0914   ALKPHOS 90 04/16/2014 0515   AST 25 12/18/2014 0914   AST 12 04/16/2014 0515   ALT 28 12/18/2014 0914   ALT 15 04/16/2014 0515   BILITOT 0.48 12/18/2014 0914   BILITOT 0.5 04/16/2014 0515       No results found for: LABCA2  No components found for: LABCA125  No results for input(s): INR in the last 168 hours.  Urinalysis    Component Value Date/Time   COLORURINE YELLOW 04/15/2014 1814    STUDIES: CLINICAL DATA: Severe low back pain. Breast cancer history.  EXAM: MRI LUMBAR SPINE WITHOUT AND WITH CONTRAST  TECHNIQUE: Multiplanar and multiecho pulse sequences of the lumbar spine were obtained without and with intravenous contrast.  CONTRAST: 55m MULTIHANCE GADOBENATE DIMEGLUMINE 529 MG/ML IV SOLN  COMPARISON: Lumbar MRI 09/15/2008  FINDINGS: Negative for fracture or mass. No enhancing spinal lesions. No evidence of metastatic disease. Conus medullaris is normal in signal and terminates at mid L2  L1-2: Mild disc bulging and mild facet degeneration without significant spinal stenosis  L2-3: Negative  L3-4: 7 mm retrolisthesis has progressed in the  interval. Moderately large central disc protrusion has developed since the prior study. Mild facet and ligamentum flavum hypertrophy. Moderate spinal stenosis, with progression from the prior study. Moderate foraminal narrowing on the right and severe foraminal narrowing on the left.  L4-5: Diffuse bulging of the disc. Advanced facet and ligamentum flavum hypertrophy bilaterally causing moderate spinal stenosis. This has progressed. Moderate to severe foraminal encroachment on the left and moderate foraminal encroachment on the right similar to the prior study.  L5-S1: 2 mm anterior slip. Moderate disc degeneration with diffuse endplate osteophyte formation. Marked facet hypertrophy bilaterally as noted previously. Enhancing soft tissue medial to the left facet projecting into the canal most likely thickened synovium versus synovial cyst. This has progressed in the interval. Marked foraminal encroachment bilaterally due to bony overgrowth of the facet joints and endplate osteophytes.  IMPRESSION: Progressive disc degeneration and retrolisthesis at L3-4. Central disc protrusion with moderate spinal stenosis. Moderate foraminal narrowing on the right and severe foraminal narrowing the left at L3-4  Moderate spinal stenosis at L4-5 is progressive. Moderate to severe foraminal encroachment on the left and moderate foraminal encroachment on the right similar to the prior study.  L5-S1 spondylosis and marked facet hypertrophy contributing to marked foraminal encroachment bilaterally. Enhancing soft tissue medial to the left facet most likely thickened synovium related to advanced facet degeneration. Cannot exclude synovial cyst in this area.   Electronically Signed  By: CFranchot GalloM.D.  On: 11/08/2014 08:18    ASSESSMENT: 69y.o. [place] woman status post left breast biopsy of 2 separate lower outer quadrant masses 01/10/2014 for a multifocal cT2 cNX, stage II  invasive ductal carcinoma, grade 3, with an MIB-1 of 90%, HER-2 negative, estrogen receptors 3% positive with weak staining, progesterone receptor negative, with an MIB-1 of 90%; this  is clinically a "triple-negative" breast cancer and will be treated as such  (1) a suspicious left axillary lymph node biopsied 01/10/2014 was negative (concordant)  (2) left lumpectomy and sentinel lymph node sampling 02/12/2014 showed an mpT2 pN0, stage IIA invasive ductal carcinoma grade 3, repeat HER-2 again negative  (3) adjuvant chemotherapy consisted of  (a) dose dense doxorubicin and cyclophosphamide x4 completed 05/02/2014  (b) weekly paclitaxel and carboplatin with 3 of 12 doses given, then stopped because of neuropathy  (c) Carboplatin and Gemzar started 06/11/2014, given on day 1 & day 8 of a 21 days cycle for 3 cycles, completed 07/30/2014  (4) adjuvant radiation 08/27/2014 through 09/21/2014:  The patient initially received a dose of 42.5 Gy in 17 fractions to the left breast using whole-breast tangent fields. This was delivered using a 3-D conformal technique. The patient then received a boost to the seroma. This delivered an additional 7.5 Gy in 3 fractions using a 3 field photon technique due to the depth of the seroma. The total dose was 50 Gy.  PLAN:  Kalyani is recovering from the radiation, but she is being held back without significant problems with spinal stenosis and degenerative disc disease. I think she would benefit from neurosurgical consultation and I will place that referral for her. Possibly some epidural treatments might "get her going". Hopefully she will not need surgery but she might eventually.  We were going to discuss starting antiestrogen's today, but I don't think she has recovered sufficiently. There is no rush because this would be done more for prophylaxis/prevention then as treatment (her breast cancer was minimally to not estrogen receptor positive). I did give her  information on the difference between tamoxifen and the aromatase inhibitors and we will pick up on that discussion when she returns to see me in 3 months.  In the meantime I affirmed her participation in the [place] program and suggested she consider the finding your new normal support group which is being organized for September. She knows to call for any problems that develop before her next visit here.    Monica [name], [place] [phone] 12/31/2014 12:38 PM

## 2015-01-10 ENCOUNTER — Other Ambulatory Visit: Payer: Self-pay | Admitting: *Deleted

## 2015-01-10 ENCOUNTER — Telehealth: Payer: Self-pay | Admitting: *Deleted

## 2015-01-10 ENCOUNTER — Other Ambulatory Visit: Payer: Self-pay | Admitting: Oncology

## 2015-01-10 DIAGNOSIS — M4800 Spinal stenosis, site unspecified: Secondary | ICD-10-CM

## 2015-01-10 DIAGNOSIS — C50512 Malignant neoplasm of lower-outer quadrant of left female breast: Secondary | ICD-10-CM

## 2015-01-10 MED ORDER — GABAPENTIN 300 MG PO CAPS: 600.0000 mg | ORAL_CAPSULE | Freq: Every day | ORAL | Status: DC

## 2015-01-10 NOTE — Telephone Encounter (Signed)
New gabapentin order escribed to pt's pharmacy. VM left for patient to make her aware of this.

## 2015-01-10 NOTE — Telephone Encounter (Signed)
VM message received from patient @ 11:04 am . Returned call to patient.   #1She states she feels she needs to increase her Gabapentin to 2 capsules at night and wants to know that it is ok to dao that. If she does this, she will need another prescription for Gabapentin as she will not have enough. She is going out of town and wants to make sure she has what she needs.[name]   #2Also she has not heard from neurosurgery office regarding her referral.  #3 The last item is that she has tried the Ultram for pain but it makes her nauseated. She wanted to know if it was ok to take her zofran with the ultram. Advised her it would ok to do and that eventually her system will get used to the Ultram and she may not feel so nauseated.

## 2015-01-10 NOTE — Telephone Encounter (Signed)
OK to take 600 mg gaba hs-- please enter script as requested  I have reentered a request for consultation by [facility]-- please call [facility] and requestnext availableappt w Dr Sherwood Gambler or Christella Noa, whoever comes irst  Thanks!

## 2015-03-18 ENCOUNTER — Telehealth: Payer: Self-pay | Admitting: *Deleted

## 2015-03-18 NOTE — Telephone Encounter (Signed)
NO ADDITIONAL NOTE

## 2015-03-20 ENCOUNTER — Telehealth: Payer: Self-pay

## 2015-03-20 DIAGNOSIS — C50512 Malignant neoplasm of lower-outer quadrant of left female breast: Secondary | ICD-10-CM

## 2015-03-20 NOTE — Telephone Encounter (Signed)
Patient calling today wondering whether she can have a flu shot, see the dentist and get scheduled for a mammogram.  Per Dr. Jana Hakim patient can have the flu shot which she will get from her PCP, see her dentist and have a mammogram which she wants to set up with her GYN at [facility].  POF placed for patient to return to see Dr. Jana Hakim in October per his note with labs.

## 2015-03-21 ENCOUNTER — Telehealth: Payer: Self-pay | Admitting: Nurse Practitioner

## 2015-03-21 NOTE — Telephone Encounter (Signed)
Called patient and she is aware of her appoinments

## 2015-03-28 ENCOUNTER — Other Ambulatory Visit (HOSPITAL_BASED_OUTPATIENT_CLINIC_OR_DEPARTMENT_OTHER): Payer: Medicare Other

## 2015-03-28 DIAGNOSIS — C50512 Malignant neoplasm of lower-outer quadrant of left female breast: Secondary | ICD-10-CM | POA: Diagnosis not present

## 2015-03-28 LAB — COMPREHENSIVE METABOLIC PANEL (CC13)
ALT: 18 U/L (ref 0–55)
ANION GAP: 7 meq/L (ref 3–11)
AST: 19 U/L (ref 5–34)
Albumin: 3.4 g/dL — ABNORMAL LOW (ref 3.5–5.0)
Alkaline Phosphatase: 93 U/L (ref 40–150)
BUN: 17.2 mg/dL (ref 7.0–26.0)
CO2: 30 meq/L — AB (ref 22–29)
Calcium: 9.4 mg/dL (ref 8.4–10.4)
Chloride: 105 mEq/L (ref 98–109)
Creatinine: 1 mg/dL (ref 0.6–1.1)
EGFR: 71 mL/min/{1.73_m2} — AB (ref >90)
Glucose: 99 mg/dl (ref 70–140)
POTASSIUM: 3.5 meq/L (ref 3.5–5.1)
SODIUM: 142 meq/L (ref 136–145)
TOTAL PROTEIN: 6.6 g/dL (ref 6.4–8.3)

## 2015-03-28 LAB — CBC WITH DIFFERENTIAL/PLATELET
BASO%: 0.5 % (ref 0.0–2.0)
BASOS ABS: 0 10*3/uL (ref 0.0–0.1)
EOS ABS: 0.1 10*3/uL (ref 0.0–0.5)
EOS%: 2.1 % (ref 0.0–7.0)
HCT: 38.3 % (ref 34.8–46.6)
HGB: 12.4 g/dL (ref 11.6–15.9)
LYMPH%: 36.7 % (ref 14.0–49.7)
MCH: 28.1 pg (ref 25.1–34.0)
MCHC: 32.4 g/dL (ref 31.5–36.0)
MCV: 86.7 fL (ref 79.5–101.0)
MONO#: 0.4 10*3/uL (ref 0.1–0.9)
MONO%: 9.2 % (ref 0.0–14.0)
NEUT#: 2 10*3/uL (ref 1.5–6.5)
NEUT%: 51.5 % (ref 38.4–76.8)
PLATELETS: 238 10*3/uL (ref 145–400)
RBC: 4.42 10*6/uL (ref 3.70–5.45)
RDW: 15.3 % — ABNORMAL HIGH (ref 11.2–14.5)
WBC: 3.8 10*3/uL — ABNORMAL LOW (ref 3.9–10.3)
lymph#: 1.4 10*3/uL (ref 0.9–3.3)

## 2015-04-04 ENCOUNTER — Other Ambulatory Visit: Payer: Self-pay | Admitting: *Deleted

## 2015-04-04 ENCOUNTER — Telehealth: Payer: Self-pay | Admitting: Oncology

## 2015-04-04 ENCOUNTER — Ambulatory Visit (HOSPITAL_BASED_OUTPATIENT_CLINIC_OR_DEPARTMENT_OTHER): Payer: Medicare Other | Admitting: Nurse Practitioner

## 2015-04-04 VITALS — BP 151/65 | HR 70 | Temp 98.9°F | Resp 18 | Ht 66.0 in | Wt 244.2 lb

## 2015-04-04 DIAGNOSIS — R232 Flushing: Secondary | ICD-10-CM

## 2015-04-04 DIAGNOSIS — C50512 Malignant neoplasm of lower-outer quadrant of left female breast: Secondary | ICD-10-CM

## 2015-04-04 DIAGNOSIS — N951 Menopausal and female climacteric states: Secondary | ICD-10-CM | POA: Diagnosis not present

## 2015-04-04 MED ORDER — ONDANSETRON HCL 8 MG PO TABS: 8.0000 mg | ORAL_TABLET | Freq: Three times a day (TID) | ORAL | Status: DC | PRN

## 2015-04-04 MED ORDER — GABAPENTIN 300 MG PO CAPS: 600.0000 mg | ORAL_CAPSULE | Freq: Every day | ORAL | Status: DC

## 2015-04-04 NOTE — Telephone Encounter (Signed)
Appointments made and avs printed °

## 2015-04-05 ENCOUNTER — Encounter: Payer: Self-pay | Admitting: Nurse Practitioner

## 2015-04-05 DIAGNOSIS — R232 Flushing: Secondary | ICD-10-CM | POA: Insufficient documentation

## 2015-04-05 NOTE — Progress Notes (Signed)
Monica [name]  [phone] [phone]     ID: Monica [name] DOB: 05/01/1946  MR#: [id number]  JHE#:[id number]  Patient Care Team: Maury Dus, MD as PCP - General (Family Medicine) Excell Seltzer, MD as Consulting Physician (General Surgery) Chauncey Cruel, MD as Consulting Physician (Oncology) Kyung Rudd, MD as Consulting Physician (Radiation Oncology) Maisie Fus, MD as Consulting Physician (Obstetrics and Gynecology)   CHIEF COMPLAINT: Newly diagnosed breast cancer  CURRENT TREATMENT:  observation  BREAST CANCER HISTORY: From the original intake note:  [name] showing a possible mass in her left breast. On 01/05/2014 left diagnostic mammography and ultrasonography at the breast Center showed 2 adjacent poorly defined masses deep in the lower outer portion of the left breast. On physical exam these were palpable measuring approximately 3 cm altogether, at the 4:00 position 16 cm from the left nipple. There were no palpable left axillary lymph nodes. Ultrasound confirmed to and adjacent irregularly marginated hypoechoic masses in the area in question, measuring 1.5 and 1.0 cm respectively. The total area of by ultrasonography measured 2.6 cm. In addition, to left axillary lymph nodes showed mild cortical thickening in one of them showed loss of the normal fatty hilum.  On 01/10/2014 the patient underwent separate biopsies of both the left breast masses in question as well as the more suspicious lymph node. The final pathology ([id number]) showed the lymph node to be benign. (This was interpreted as concordant). Both of the breast masses, however, were positive for an invasive ductal carcinoma, which was HER-2 not amplified, with the signals ratio of 0.98 and the number per cell being 2.45. Estrogen and progesterone receptor determination is pending but looks negative at this point.  On  01/16/2014 the patient underwent bilateral breast MRI the right breast was unremarkable. In the left breast lower outer quadrant there were 2 oval masses measuring 1.5 and 2.2 cm. Taken together they was approximately 4 cm of disease. There were no findings of concern in the right breast. In the left axilla there was a single mildly prominent lymph node. There was no suspicious internal mammary adenopathy.  The patient's subsequent history is as detailed below  INTERVAL HISTORY: Monica [name] returns today for follow up of her breast cancer and discussion on antiestrogen therapy. The interval history is remarkable for continued struggles with pain related to her degenerative disc disease. She is now on steroids and hydrocodone 5/25 PRN. She is expected to receive an epidural for the pain later this month. The pain concentrates in her buttocks and upper thighs. She is slow to move and needs time to go from a seated to standing position or vice versa. She has sustained numbness to the top of her left thigh.  REVIEW OF SYSTEMS: Monica [name] denies fevers, chills, nausea, vomiting, or changes in bowel or bladder habits. She has no headaches, dizziness, vision changes, or weakness outside of her legs. She denies shortness of breath, chest pain, cough, or palpitations. She has shooting pains to her right breast. She is using gabapentin QHS for leg pain and hot flashes. Her hot flashes can be severe, but she does not take gabapentin during the day due to drowsiness. A detailed review of systems is otherwise stable.  PAST MEDICAL HISTORY: Past Medical History  Diagnosis Date  . Allergy codeine    rapid heart beat, cold sweat  . Arthritis   . Hypertension   . Breast cancer 01/10/14  Left breast  . S/P radiation therapy 09/21/14 completed    left breast    PAST SURGICAL HISTORY: Past Surgical History  Procedure Laterality Date  . Knee arthroscopy  2012    right  . Lumbar laminectomy  1991  . Colonoscopy    .  Foot arthroplasty  1998    right  . Abdominal hysterectomy  1992  . Breast lumpectomy with needle localization and axillary sentinel lymph node bx Left 02/12/2014    Procedure: LEFT BREAST LUMPECTOMY WITH NEEDLE LOCALIZATION AND LEFT AXILLARY SENTINEL LYMPH NODE BX;  Surgeon: [name], MD;  Location: [facility];  Service: General;  Laterality: Left;  . Portacath placement Right 02/12/2014    Procedure: INSERTION PORT-A-CATH;  Surgeon: [name], MD;  Location: [facility];  Service: General;  Laterality: Right;    FAMILY HISTORY Family History  Problem Relation Age of Onset  . Cancer Sister   . Cancer Brother   . Cancer Maternal Aunt   . Cancer Paternal Aunt    the patient's father died at the age of 75 from a stroke. The patient's mother died at the age of 28 from "multiple causes". The patient had 2 brothers and 2 sisters. The patient's sister was diagnosed with uterine cancer at the age of 18, and the patient's brother [name] had "bone cancer" and died in his 77s. He was treated at the [facility]. There is no history of breast or ovarian cancer in the family to the patient's knowledge.  GYNECOLOGIC HISTORY:  No LMP recorded. Patient has had a hysterectomy. Menarche age 20, the patient is GX P0. She underwent hysterectomy in 1992, with no salpingo-oophorectomy. She has been on estrogen replacement since. 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 Her husband [name] worked for [employer] in [place] as a Facilities manager. The patient's adopted son [name] works as a laborer in [place]. The patient's god-daughter [name] is an Optometrist. The patient has no grandchildren. The patient attends a local church of  [place], and her husband [name] is pastor   ADVANCED DIRECTIVES: In  place   HEALTH MAINTENANCE: Social History  Substance Use Topics  . Smoking status: Never Smoker   . Smokeless tobacco: Never Used  . Alcohol Use: No     Colonoscopy: October 2008  PAP: July 2015  Bone density: 01/01/2014  Lipid panel:  Allergies  Allergen Reactions  . Codeine Other (See Comments)    Heart fast    Current Outpatient Prescriptions  Medication Sig Dispense Refill  . amLODipine (NORVASC) 5 MG tablet Take 5 mg by mouth daily.    . Cholecalciferol (VITAMIN D3) 2000 UNITS TABS Take 1 tablet by mouth daily.    [name] docusate sodium (COLACE) 100 MG capsule Take 100 mg by mouth 2 (two) times daily.    . Ginkgo Biloba 120 MG CAPS Take 1 capsule by mouth daily.    . Omega-3 Fatty Acids (FISH OIL CONCENTRATE) 300 MG CAPS Take by mouth.    . valsartan-hydrochlorothiazide (DIOVAN-HCT) 320-25 MG per tablet Take 1 tablet by mouth daily.    [name] gabapentin (NEURONTIN) 300 MG capsule Take 2 capsules (600 mg total) by mouth at bedtime. 60 capsule 3  . HYDROcodone-acetaminophen (NORCO/VICODIN) 5-325 MG tablet     . meloxicam (MOBIC) 15 MG tablet Take 15 mg by  mouth daily.    . ondansetron (ZOFRAN) 8 MG tablet Take 1 tablet (8 mg total) by mouth every 8 (eight) hours as needed for nausea or vomiting. 30 tablet 1  . traMADol (ULTRAM) 50 MG tablet Take 1 tablet (50 mg total) by mouth every 6 (six) hours as needed. (Patient not taking: Reported on 04/04/2015) 60 tablet 3   No current facility-administered medications for this visit.    OBJECTIVE: Middle-aged [place] American woman in no acute distress Filed Vitals:   04/04/15 1350  BP: 151/65  Pulse: 70  Temp: 98.9 F (37.2 C)  Resp: 18     Body mass index is 39.43 kg/(m^2).    ECOG FS:1 - Symptomatic but completely ambulatory  Skin: warm, dry  HEENT: sclerae anicteric, conjunctivae pink, oropharynx clear. No thrush or mucositis.  Lymph Nodes: No cervical or supraclavicular lymphadenopathy  Lungs: clear to auscultation  bilaterally, no rales, wheezes, or rhonci  Heart: regular rate and rhythm  Abdomen: round, soft, non tender, positive bowel sounds  Musculoskeletal: No focal spinal tenderness, no peripheral edema  Neuro: non focal, well oriented, positive affect  Breasts: left breast status post lumpectomy. No evidence of recurrent disease. Left axilla benign, right breast unremarkable.  LAB RESULTS:  CMP     Component Value Date/Time   NA 142 03/28/2015 1419   NA 141 04/18/2014 0444   K 3.5 03/28/2015 1419   K 3.7 04/18/2014 0444   CL 106 04/18/2014 0444   CO2 30* 03/28/2015 1419   CO2 24 04/18/2014 0444   GLUCOSE 99 03/28/2015 1419   GLUCOSE 127* 04/18/2014 0444   BUN 17.2 03/28/2015 1419   BUN 6 04/18/2014 0444   CREATININE 1.0 03/28/2015 1419   CREATININE 0.90 11/08/2014 0725   CALCIUM 9.4 03/28/2015 1419   CALCIUM 8.7 04/18/2014 0444   PROT 6.6 03/28/2015 1419   PROT 5.8* 04/16/2014 0515   ALBUMIN 3.4* 03/28/2015 1419   ALBUMIN 2.6* 04/16/2014 0515   AST 19 03/28/2015 1419   AST 12 04/16/2014 0515   ALT 18 03/28/2015 1419   ALT 15 04/16/2014 0515   ALKPHOS 93 03/28/2015 1419   ALKPHOS 90 04/16/2014 0515   BILITOT <0.30 03/28/2015 1419   BILITOT 0.5 04/16/2014 0515   GFRNONAA >90 04/18/2014 0444   GFRAA >90 04/18/2014 0444    I No results found for: SPEP  Lab Results  Component Value Date   WBC 3.8* 03/28/2015   NEUTROABS 2.0 03/28/2015   HGB 12.4 03/28/2015   HCT 38.3 03/28/2015   MCV 86.7 03/28/2015   PLT 238 03/28/2015      Chemistry      Component Value Date/Time   NA 142 03/28/2015 1419   NA 141 04/18/2014 0444   K 3.5 03/28/2015 1419   K 3.7 04/18/2014 0444   CL 106 04/18/2014 0444   CO2 30* 03/28/2015 1419   CO2 24 04/18/2014 0444   BUN 17.2 03/28/2015 1419   BUN 6 04/18/2014 0444   CREATININE 1.0 03/28/2015 1419   CREATININE 0.90 11/08/2014 0725      Component Value Date/Time   CALCIUM 9.4 03/28/2015 1419   CALCIUM 8.7 04/18/2014 0444   ALKPHOS 93  03/28/2015 1419   ALKPHOS 90 04/16/2014 0515   AST 19 03/28/2015 1419   AST 12 04/16/2014 0515   ALT 18 03/28/2015 1419   ALT 15 04/16/2014 0515   BILITOT <0.30 03/28/2015 1419   BILITOT 0.5 04/16/2014 0515       No results found  for: LABCA2  No components found for: LABCA125  No results for input(s): INR in the last 168 hours.  Urinalysis    Component Value Date/Time   COLORURINE YELLOW 04/15/2014 1814    STUDIES: No results found.   ASSESSMENT: 69 y.o. [place] woman status post left breast biopsy of 2 separate lower outer quadrant masses 01/10/2014 for a multifocal cT2 cNX, stage II invasive ductal carcinoma, grade 3, with an MIB-1 of 90%, HER-2 negative, estrogen receptors 3% positive with weak staining, progesterone receptor negative, with an MIB-1 of 90%; this is clinically a "triple-negative" breast cancer and will be treated as such  (1) a suspicious left axillary lymph node biopsied 01/10/2014 was negative (concordant)  (2) left lumpectomy and sentinel lymph node sampling 02/12/2014 showed an mpT2 pN0, stage IIA invasive ductal carcinoma grade 3, repeat HER-2 again negative  (3) adjuvant chemotherapy consisted of  (a) dose dense doxorubicin and cyclophosphamide x4 completed 05/02/2014  (b) weekly paclitaxel and carboplatin with 3 of 12 doses given, then stopped because of neuropathy  (c) Carboplatin and Gemzar started 06/11/2014, given on day 1 & day 8 of a 21 days cycle for 3 cycles, completed 07/30/2014  (4) adjuvant radiation 08/27/2014 through 09/21/2014:  The patient initially received a dose of 42.5 Gy in 17 fractions to the left breast using whole-breast tangent fields. This was delivered using a 3-D conformal technique. The patient then received a boost to the seroma. This delivered an additional 7.5 Gy in 3 fractions using a 3 field photon technique due to the depth of the seroma. The total dose was 50 Gy.  PLAN:  Monica [name] and I spent 40 minutes discussing  the biology of her cancer. She understands that we treated her clinically as a "triple negative" patient because her estrogen receptor staining was so weak at 3%. Dr. Doris Cheadle has introduced the idea of antiestrogen therapy as a means of prophylactic treatment against a future breast cancer that may be more strongly estrogen positive. She understands that this is not being considered as treatment for her previous breast cancer. We reviewed a handout outlining the differences between anastrozole and tamoxifen, and at this time she is leaning towards tamoxifen. She was previously on hormone replacement therapy with no episodes of clotting, and she has had a hysterectomy so she would avoid the potential risk for uterine polyps or uterine cancer. She understands that this drug is generic, and should cost her no more than $12-15 per month. We discussed common side effects such as hot flashes and vaginal wetness. Given everything that is going on with her back, we are setting a tentative start date for 05/23/15.   For her current hot flashes she will continue 649m gabapentin QHS. We are also going to add 37.559mvenlafaxine daily to her regimen. She understands that this drug will take at least 2 weeks to build up into her system, and that if she wishes to discontinue it she must wean off of it slowly.   She does not have the name of her new mail order pharmacy, but she will call [place] with that information so that I may send in the venlafaxine and tamoxifen prescriptions promptly.  She is overdue for a repeat mammogram, so [facility]  EvJenevieveill return in February for follow up. She understands and agrees with this plan. She has been encouraged to call with any issues that might arise before her next  visit here.  [name] Panda, NP 04/05/2015 8:53 AM

## 2015-04-10 ENCOUNTER — Other Ambulatory Visit: Payer: Self-pay | Admitting: *Deleted

## 2015-04-10 ENCOUNTER — Telehealth: Payer: Self-pay | Admitting: *Deleted

## 2015-04-10 MED ORDER — VENLAFAXINE HCL 37.5 MG PO TABS: 37.5000 mg | ORAL_TABLET | Freq: Every day | ORAL | Status: DC

## 2015-04-10 MED ORDER — TAMOXIFEN CITRATE 20 MG PO TABS: 20.0000 mg | ORAL_TABLET | Freq: Every day | ORAL | Status: DC

## 2015-04-10 NOTE — Telephone Encounter (Signed)
Sent Rx's for Venlafaxine and Tamoxifen to [facility].

## 2015-04-11 ENCOUNTER — Ambulatory Visit
Admission: RE | Admit: 2015-04-11 | Discharge: 2015-04-11 | Disposition: A | Discharge status: Home / Self Care | Payer: Medicare Other | Source: Ambulatory Visit | Attending: Nurse Practitioner | Admitting: Nurse Practitioner

## 2015-04-11 DIAGNOSIS — C50512 Malignant neoplasm of lower-outer quadrant of left female breast: Secondary | ICD-10-CM

## 2015-04-16 ENCOUNTER — Telehealth: Payer: Self-pay | Admitting: *Deleted

## 2015-04-16 NOTE — Telephone Encounter (Signed)
Returned pt's call concerning nutritional programs that she might be interested in to shed some unwanted pounds. Pt will call these resources herself and let us know which one she chooses. She also wanted clarification on the venlafaxine 37.5 mg she was prescribed. I told her that was Rx for the  hot flashes. She had forgotten. No further concerns.

## 2015-05-06 ENCOUNTER — Telehealth: Payer: Self-pay | Admitting: *Deleted

## 2015-05-06 NOTE — Telephone Encounter (Signed)
Call from patient asking if she should stop gabapentin.  "I"m having noticeable memory loss from the gabapentin.  It helps me sleep.  I started gabapentin for thigh pain.  (Two gabapentin at bedtime)  I now know this pain is from my back.  Received an Epidural injection a few weeks ago which has helped my back and thigh pain.  If he doesn't want me to continue gabapentin, I'll need something to help me relax to sleep at night."   Return number home: [phone] or mobile [phone].

## 2015-05-06 NOTE — Telephone Encounter (Signed)
Drop the gaba dose to 300 mg (one tablet) at bedtime-- should help w sleep w/o affecting memory so much  Can also try benadryl for sleep  Thanks!

## 2015-05-06 NOTE — Telephone Encounter (Signed)
Called patient back who states that she had a epidural for her back pain and her pain at this time is non existent.  She feels that she could go off the gabapentin however will take 300 mg per Dr. Jana Hakim for several nights then if she continues to be pain free will then take the medication every other night.  She will try the benadryl if sleeping becomes a problem.  Patient will update next week.

## 2015-05-28 ENCOUNTER — Telehealth: Payer: Self-pay | Admitting: *Deleted

## 2015-05-28 NOTE — Telephone Encounter (Signed)
"  I'm sorry I missed your call.  Please call back. [phone]."

## 2015-05-30 NOTE — Progress Notes (Signed)
Called patient again today and was told by her husband that she was napping.  Patient to call back tomorrow.

## 2015-06-03 NOTE — Telephone Encounter (Signed)
Collaborative tried patient again on 05-30-2015.

## 2015-06-07 ENCOUNTER — Telehealth: Payer: Self-pay | Admitting: Nurse Practitioner

## 2015-06-07 ENCOUNTER — Telehealth: Payer: Self-pay | Admitting: *Deleted

## 2015-06-07 NOTE — Telephone Encounter (Signed)
This RN spoke with pt per return call regarding " side effects of the tamoxifen ".  Per discussion pt states she is experiancing ongoing fatigue - sleep interference- tingling in extremities and " just not feeling good ".  Per phone callAnnet Rehling with hold the tamoxifen and call this RN in 2 weeks with update.

## 2015-06-07 NOTE — Telephone Encounter (Signed)
Patient aware of her January appointment

## 2015-06-07 NOTE — Telephone Encounter (Signed)
Pt called and left message wanting to talk to nurse about Tamoxifen.   Spoke with pt and was informed that she has started taking Tamoxifen about 2 weeks now.  Has experienced Shortness of Breath intermittently,  Has  Headache - especially on Top of head,  Has  Intermittent  Tingling in  Left  Arm,  Denied  Swelling,  Had mild  Nausea at first but ok now. Pt had taken her dose today.   Pt would like to know what Dr. Jana Hakim would suggest. Pt's   Phone   [phone].

## 2015-07-01 ENCOUNTER — Other Ambulatory Visit (HOSPITAL_BASED_OUTPATIENT_CLINIC_OR_DEPARTMENT_OTHER): Payer: Medicare Other

## 2015-07-01 ENCOUNTER — Telehealth: Payer: Self-pay | Admitting: Oncology

## 2015-07-01 ENCOUNTER — Other Ambulatory Visit: Payer: Self-pay | Admitting: Oncology

## 2015-07-01 ENCOUNTER — Ambulatory Visit (HOSPITAL_BASED_OUTPATIENT_CLINIC_OR_DEPARTMENT_OTHER): Payer: Medicare Other | Admitting: Nurse Practitioner

## 2015-07-01 ENCOUNTER — Other Ambulatory Visit: Payer: Self-pay | Admitting: *Deleted

## 2015-07-01 ENCOUNTER — Encounter: Payer: Self-pay | Admitting: Nurse Practitioner

## 2015-07-01 VITALS — BP 132/72 | HR 70 | Temp 97.8°F | Resp 18 | Ht 66.0 in | Wt 232.1 lb

## 2015-07-01 DIAGNOSIS — Z171 Estrogen receptor negative status [ER-]: Secondary | ICD-10-CM

## 2015-07-01 DIAGNOSIS — C50512 Malignant neoplasm of lower-outer quadrant of left female breast: Secondary | ICD-10-CM | POA: Diagnosis not present

## 2015-07-01 DIAGNOSIS — R232 Flushing: Secondary | ICD-10-CM

## 2015-07-01 DIAGNOSIS — N951 Menopausal and female climacteric states: Secondary | ICD-10-CM | POA: Diagnosis not present

## 2015-07-01 LAB — COMPREHENSIVE METABOLIC PANEL
ALT: 23 U/L (ref 0–55)
ANION GAP: 8 meq/L (ref 3–11)
AST: 19 U/L (ref 5–34)
Albumin: 3.4 g/dL — ABNORMAL LOW (ref 3.5–5.0)
Alkaline Phosphatase: 82 U/L (ref 40–150)
BUN: 14.9 mg/dL (ref 7.0–26.0)
CHLORIDE: 103 meq/L (ref 98–109)
CO2: 30 mEq/L — ABNORMAL HIGH (ref 22–29)
Calcium: 9.6 mg/dL (ref 8.4–10.4)
Creatinine: 0.9 mg/dL (ref 0.6–1.1)
EGFR: 73 mL/min/{1.73_m2} — ABNORMAL LOW (ref >90)
Glucose: 100 mg/dl (ref 70–140)
POTASSIUM: 3.2 meq/L — AB (ref 3.5–5.1)
Sodium: 140 mEq/L (ref 136–145)
Total Bilirubin: 0.56 mg/dL (ref 0.20–1.20)
Total Protein: 6.9 g/dL (ref 6.4–8.3)

## 2015-07-01 LAB — CBC WITH DIFFERENTIAL/PLATELET
BASO%: 1.1 % (ref 0.0–2.0)
BASOS ABS: 0.1 10*3/uL (ref 0.0–0.1)
EOS%: 1.1 % (ref 0.0–7.0)
Eosinophils Absolute: 0.1 10*3/uL (ref 0.0–0.5)
HEMATOCRIT: 41.1 % (ref 34.8–46.6)
HGB: 13.5 g/dL (ref 11.6–15.9)
LYMPH#: 1.5 10*3/uL (ref 0.9–3.3)
LYMPH%: 34.3 % (ref 14.0–49.7)
MCH: 28.5 pg (ref 25.1–34.0)
MCHC: 32.8 g/dL (ref 31.5–36.0)
MCV: 86.9 fL (ref 79.5–101.0)
MONO#: 0.3 10*3/uL (ref 0.1–0.9)
MONO%: 7.1 % (ref 0.0–14.0)
NEUT#: 2.5 10*3/uL (ref 1.5–6.5)
NEUT%: 56.4 % (ref 38.4–76.8)
PLATELETS: 258 10*3/uL (ref 145–400)
RBC: 4.73 10*6/uL (ref 3.70–5.45)
RDW: 15.3 % — ABNORMAL HIGH (ref 11.2–14.5)
WBC: 4.4 10*3/uL (ref 3.9–10.3)

## 2015-07-01 MED ORDER — GABAPENTIN 300 MG PO CAPS: 300.0000 mg | ORAL_CAPSULE | Freq: Every day | ORAL | Status: DC

## 2015-07-01 MED ORDER — VENLAFAXINE HCL ER 75 MG PO CP24: 75.0000 mg | ORAL_CAPSULE | Freq: Every day | ORAL | Status: DC

## 2015-07-01 NOTE — Telephone Encounter (Signed)
Appointments made and avs printed for patient °

## 2015-07-01 NOTE — Progress Notes (Signed)
[place]  [phone] [phone]) [phone]     ID: Monica [name] DOB: 01-04-1946  MR#: [id number]  SWH#:[id number]  Patient Care Team: Maury Dus, MD as PCP - General (Family Medicine) Excell Seltzer, MD as Consulting Physician (General Surgery) Chauncey Cruel, MD as Consulting Physician (Oncology) Kyung Rudd, MD as Consulting Physician (Radiation Oncology) Maisie Fus, MD as Consulting Physician (Obstetrics and Gynecology)   CHIEF COMPLAINT: Newly diagnosed breast cancer  CURRENT TREATMENT:  observation  BREAST CANCER HISTORY: From the original intake note:  [facility] showing a possible mass in her left breast. On 01/05/2014 left diagnostic mammography and ultrasonography at the breast Center showed 2 adjacent poorly defined masses deep in the lower outer portion of the left breast. On physical exam these were palpable measuring approximately 3 cm altogether, at the 4:00 position 16 cm from the left nipple. There were no palpable left axillary lymph nodes. Ultrasound confirmed to and adjacent irregularly marginated hypoechoic masses in the area in question, measuring 1.5 and 1.0 cm respectively. The total area of by ultrasonography measured 2.6 cm. In addition, to left axillary lymph nodes showed mild cortical thickening in one of them showed loss of the normal fatty hilum.  On 01/10/2014 the patient underwent separate biopsies of both the left breast masses in question as well as the more suspicious lymph node. The final pathology ([id number]) showed the lymph node to be benign. (This was interpreted as concordant). Both of the breast masses, however, were positive for an invasive ductal carcinoma, which was HER-2 not amplified, with the signals ratio of 0.98 and the number per cell being 2.45. Estrogen and progesterone receptor determination is pending but looks negative at this point.  On  01/16/2014 the patient underwent bilateral breast MRI the right breast was unremarkable. In the left breast lower outer quadrant there were 2 oval masses measuring 1.5 and 2.2 cm. Taken together they was approximately 4 cm of disease. There were no findings of concern in the right breast. In the left axilla there was a single mildly prominent lymph node. There was no suspicious internal mammary adenopathy.  The patient's subsequent history is as detailed below  INTERVAL HISTORY: [name] returns today for follow up of her breast cancer and discussion on antiestrogen therapy, accompanied by her goddaughter [name] via speakerphone. [name] started tamoxifen on December 1st, and was advised to stop the drug 2 weeks later on 12/16 due to intolerable side effects. She complained of increased headaches, chest pain, and numbness from her left leg down, in addition to left breast pain. These symptoms have resolved since stopping th drug. She has not experienced any recurrence of these side effects since that time, and does not recall them occuring prior to the start of tamoxifen.   REVIEW OF SYSTEMS: [name] continues to have multiple hot flashes daily, even since stopping tamoxifen. She is on 689m QHS as well as 37.555mvenlafaxine daily. Otherwise she denies vaginal changes. Her back pain has improved with a series of injections and epidurals. The pain concentrates in her buttocks and upper thighs. She is slow to move and needs time to go from a seated to standing position or vice versa. She has numbness to her left upper thigh. She has no headaches, dizziness, vision changes, or weakness outside of her legs. A detailed review of systems is otherwise negative.   PAST MEDICAL HISTORY: Past Medical History  Diagnosis Date  . Allergy codeine  rapid heart beat, cold sweat  . Arthritis   . Hypertension   . Breast cancer ([place]) 01/10/14    Left breast  . S/P radiation therapy 09/21/14 completed    left breast      PAST SURGICAL HISTORY: Past Surgical History  Procedure Laterality Date  . Knee arthroscopy  2012    right  . Lumbar laminectomy  1991  . Colonoscopy    . Foot arthroplasty  1998    right  . Abdominal hysterectomy  1992  . Breast lumpectomy with needle localization and axillary sentinel lymph node bx Left 02/12/2014    Procedure: LEFT BREAST LUMPECTOMY WITH NEEDLE LOCALIZATION AND LEFT AXILLARY SENTINEL LYMPH NODE BX;  Surgeon: [name], MD;  Location: [facility];  Service: General;  Laterality: Left;  . Portacath placement Right 02/12/2014    Procedure: INSERTION PORT-A-CATH;  Surgeon: [name], MD;  Location: [facility];  Service: General;  Laterality: Right;    FAMILY HISTORY Family History  Problem Relation Age of Onset  . Cancer Sister   . Cancer Brother   . Cancer Maternal Aunt   . Cancer Paternal Aunt    the patient's father died at the age of 62 from a stroke. The patient's mother died at the age of 51 from "multiple causes". The patient had 2 brothers and 2 sisters. The patient's sister was diagnosed with uterine cancer at the age of 74, and the patient's brother [name] had "bone cancer" and died in his 86s. He was treated at the [facility]. There is no history of breast or ovarian cancer in the family to the patient's knowledge.  GYNECOLOGIC HISTORY:  No LMP recorded. Patient has had a hysterectomy. Menarche age 70, the patient is GX P0. She underwent hysterectomy in 1992, with no salpingo-oophorectomy. She has been on estrogen replacement since. 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 Her husband [name] worked for [employer] in [place] as a Facilities manager. The patient's adopted son [name] works as a laborer in [place]. The patient's god-daughter [name] is  an Optometrist. The patient has no grandchildren. The patient attends a local church of  [place], and her husband [name] is pastor   ADVANCED DIRECTIVES: In place   HEALTH MAINTENANCE: Social History  Substance Use Topics  . Smoking status: Never Smoker   . Smokeless tobacco: Never Used  . Alcohol Use: No     Colonoscopy: October 2008  PAP: July 2015  Bone density: 01/01/2014  Lipid panel:  Allergies  Allergen Reactions  . Codeine Other (See Comments)    Heart fast    Current Outpatient Prescriptions  Medication Sig Dispense Refill  . amLODipine (NORVASC) 5 MG tablet Take 5 mg by mouth daily.    . Cholecalciferol (VITAMIN D3) 2000 UNITS TABS Take 1 tablet by mouth daily.    [name] docusate sodium (COLACE) 100 MG capsule Take 100 mg by mouth 2 (two) times daily.    . Ginkgo Biloba 120 MG CAPS Take 1 capsule by mouth daily.    . meloxicam (MOBIC) 15 MG tablet Take 15 mg by mouth daily.    . Omega-3 Fatty Acids (FISH OIL CONCENTRATE) 300 MG CAPS Take by mouth.    . valsartan-hydrochlorothiazide (DIOVAN-HCT) 320-25 MG per tablet Take 1 tablet by mouth daily.    [name] venlafaxine (  EFFEXOR) 37.5 MG tablet Take 1 tablet (37.5 mg total) by mouth daily. 90 tablet 3  . gabapentin (NEURONTIN) 300 MG capsule Take 1 capsule (300 mg total) by mouth at bedtime. 90 capsule 2  . HYDROcodone-acetaminophen (NORCO/VICODIN) 5-325 MG tablet Reported on 07/01/2015    . tamoxifen (NOLVADEX) 20 MG tablet Take 1 tablet (20 mg total) by mouth daily. (Patient not taking: Reported on 07/01/2015) 90 tablet 3  . venlafaxine XR (EFFEXOR-XR) 75 MG 24 hr capsule Take 1 capsule (75 mg total) by mouth daily with breakfast. 90 capsule 3   No current facility-administered medications for this visit.    OBJECTIVE: Middle-aged [place] American woman in no acute distress Filed Vitals:   07/01/15 0916  BP: 132/72  Pulse: 70  Temp: 97.8 F (36.6 C)  Resp: 18     Body mass index is 37.48 kg/(m^2).    ECOG FS:1 - Symptomatic  but completely ambulatory  Skin: warm, dry  HEENT: sclerae anicteric, conjunctivae pink, oropharynx clear. No thrush or mucositis.  Lymph Nodes: No cervical or supraclavicular lymphadenopathy  Lungs: clear to auscultation bilaterally, no rales, wheezes, or rhonci  Heart: regular rate and rhythm  Abdomen: round, soft, non tender, positive bowel sounds  Musculoskeletal: No focal spinal tenderness, no peripheral edema  Neuro: non focal, well oriented, positive affect  Breasts: left breast status post lumpectomy. No evidence of recurrent disease. Left axilla benign, right breast unremarkable.   LAB RESULTS:  CMP     Component Value Date/Time   NA 140 07/01/2015 0852   NA 141 04/18/2014 0444   K 3.2* 07/01/2015 0852   K 3.7 04/18/2014 0444   CL 106 04/18/2014 0444   CO2 30* 07/01/2015 0852   CO2 24 04/18/2014 0444   GLUCOSE 100 07/01/2015 0852   GLUCOSE 127* 04/18/2014 0444   BUN 14.9 07/01/2015 0852   BUN 6 04/18/2014 0444   CREATININE 0.9 07/01/2015 0852   CREATININE 0.90 11/08/2014 0725   CALCIUM 9.6 07/01/2015 0852   CALCIUM 8.7 04/18/2014 0444   PROT 6.9 07/01/2015 0852   PROT 5.8* 04/16/2014 0515   ALBUMIN 3.4* 07/01/2015 0852   ALBUMIN 2.6* 04/16/2014 0515   AST 19 07/01/2015 0852   AST 12 04/16/2014 0515   ALT 23 07/01/2015 0852   ALT 15 04/16/2014 0515   ALKPHOS 82 07/01/2015 0852   ALKPHOS 90 04/16/2014 0515   BILITOT 0.56 07/01/2015 0852   BILITOT 0.5 04/16/2014 0515   GFRNONAA >90 04/18/2014 0444   GFRAA >90 04/18/2014 0444    I No results found for: SPEP  Lab Results  Component Value Date   WBC 4.4 07/01/2015   NEUTROABS 2.5 07/01/2015   HGB 13.5 07/01/2015   HCT 41.1 07/01/2015   MCV 86.9 07/01/2015   PLT 258 07/01/2015      Chemistry      Component Value Date/Time   NA 140 07/01/2015 0852   NA 141 04/18/2014 0444   K 3.2* 07/01/2015 0852   K 3.7 04/18/2014 0444   CL 106 04/18/2014 0444   CO2 30* 07/01/2015 0852   CO2 24 04/18/2014 0444     BUN 14.9 07/01/2015 0852   BUN 6 04/18/2014 0444   CREATININE 0.9 07/01/2015 0852   CREATININE 0.90 11/08/2014 0725      Component Value Date/Time   CALCIUM 9.6 07/01/2015 0852   CALCIUM 8.7 04/18/2014 0444   ALKPHOS 82 07/01/2015 0852   ALKPHOS 90 04/16/2014 0515   AST 19 07/01/2015 0852  AST 12 04/16/2014 0515   ALT 23 07/01/2015 0852   ALT 15 04/16/2014 0515   BILITOT 0.56 07/01/2015 0852   BILITOT 0.5 04/16/2014 0515       No results found for: LABCA2  No components found for: [id number]  No results for input(s): INR in the last 168 hours.  Urinalysis    Component Value Date/Time   COLORURINE YELLOW 04/15/2014 1814    STUDIES: No results found.   ASSESSMENT: 70 y.o. [place] woman status post left breast biopsy of 2 separate lower outer quadrant masses 01/10/2014 for a multifocal cT2 cNX, stage II invasive ductal carcinoma, grade 3, with an MIB-1 of 90%, HER-2 negative, estrogen receptors 3% positive with weak staining, progesterone receptor negative, with an MIB-1 of 90%; this is clinically a "triple-negative" breast cancer and will be treated as such  (1) a suspicious left axillary lymph node biopsied 01/10/2014 was negative (concordant)  (2) left lumpectomy and sentinel lymph node sampling 02/12/2014 showed an mpT2 pN0, stage IIA invasive ductal carcinoma grade 3, repeat HER-2 again negative  (3) adjuvant chemotherapy consisted of  (a) dose dense doxorubicin and cyclophosphamide x4 completed 05/02/2014  (b) weekly paclitaxel and carboplatin with 3 of 12 doses given, then stopped because of neuropathy  (c) Carboplatin and Gemzar started 06/11/2014, given on day 1 & day 8 of a 21 days cycle for 3 cycles, completed 07/30/2014  (4) adjuvant radiation 08/27/2014 through 09/21/2014:  The patient initially received a dose of 42.5 Gy in 17 fractions to the left breast using whole-breast tangent fields. This was delivered using a 3-D conformal technique. The patient  then received a boost to the seroma. This delivered an additional 7.5 Gy in 3 fractions using a 3 field photon technique due to the depth of the seroma. The total dose was 50 Gy.  (5) tamoxifen started (prophylactically) in December 2016; stopped after 2 weeks due to side effects.   PLAN:  Unfortunately, Amyri had a poor experience with tamoxifen last month. As stated in the previous progress note, the addition of antiestrogen therapy was a a means for prophylactic treatment against a future estrogen positive breast cancer, as her original cancer is being considered "triple negative" due to its weak estrogen staining. Therefore, the patient is justified in forgoing this elective therapy due to intolerance, though I personally am not convinced that her complaints of chest pain, headaches, or tingling are due to tamoxifen alone. In any case, these symptoms have resolved since stopping the drug. Due to her current intense back and other joint pains, the patient does not believe that she wants to trial an aromatase inhibitor either. We will continue with observation alone as a "triple negative" breast cancer patient.   For her hot flashes, she will continue the 633m gabapentin QHS. We are going to increase her venlafaxine to 772mdaily. She understands that this drug will take at least 2 weeks to build up into her system, and that if she wishes to discontinue it she must wean off of it slowly.   Eveyln will will return in 3 months for follow up with Dr. MaJana HakimShe understands and agrees with this plan. She has been encouraged to call with any issues that might arise before her next visit here.   HeLaurie PandaNP 07/01/2015 10:45 AM

## 2015-07-01 NOTE — Progress Notes (Signed)
Sent a Rx Refill for Gabapentin 300 mg capsule Disp-30, R-0 to her local pharmacy b/c pt doesn't have anymore. Pt will p/u medication today.

## 2015-08-06 ENCOUNTER — Ambulatory Visit (INDEPENDENT_AMBULATORY_CARE_PROVIDER_SITE_OTHER): Payer: Medicare Other | Admitting: Family Medicine

## 2015-08-06 ENCOUNTER — Encounter: Payer: Self-pay | Admitting: Family Medicine

## 2015-08-06 NOTE — Patient Instructions (Addendum)
-   Medicare covers Intensive Behavioral Therapy (IBT) for both obesity and cardiovascular disease, but RDs are not providers Medicare has approved to deliver this service UNLESS they are in a physician's office and can bill for the service as "incident to" the physician visit.    - Otherwise, Medicare covers Medical Nutrition Therapy (MNT) ONLY for patients with Diabetes or Renal Disease (not on dialysis).    - For cancer prevention, it's probably very important that you consume the highest quality fats you can eat.  That means fats such as XV olive oil, avocado, non-fried fish (and in moderation:) nuts, seeds, and nut butters, salad dressings or other foods made with corn, "vegetable," or soy oil.  This means limiting (or avoiding) foods with hydrogenated fats, vegetable shortening, and trans fat.  ALSO:  Fruits and veg's contain a lot of anti-cancer phytochemicals.  Cruciferous veg's have been shown to help prevent cancer.   - If you are trying to lose weight, you will have more success if you limit your fat intake in general.    Goals: 1. Eat at least 3 REAL meals and 1-2 snacks per day.  Aim for no more than 5 hours between eating.  Eat breakfast within one hour of getting up.  2. Obtain twice as many veg's as protein or carbohydrate foods for both lunch and dinner. 3. Physical activity: At least 60 min 3 X wk.  (More days of at least 20 min exercise will be especially beneficial.)  Complete your GOALS SHEET to monitor progress.    Email [email or web address] if interested in the wt mgmt class that starts Apr 4.  (Meets at [facility], 5:30-6:30 PM six Tuesdays thru May 9; $150.)

## 2015-08-06 NOTE — Progress Notes (Signed)
Medical Nutrition Therapy:  Appt start time: 1330 end time:  1430. PCP:  Maury Dus, MD; [facility]  Assessment:  Primary concerns today: Weight management.  Ms. Monica [name] is interested in weight loss, referred by Dr. Alyson [name] at [facility].  She had breast cancer diagnosed in 2015; ended chemo-radiation in April of 2016; has lost >20 lb since pre-dx.    Learning Readiness: Ready  Usual eating pattern includes 1-2 meals and 1-2 snacks per day. Frequent foods and beverages include bkfast of bacon, eggs, toast, sometimes instant oatmeal, banana, salad, water, 1 c coffee with creamer and sugar.  Avoided foods include most beef, beans, broccoli, cauliflower.  Eats restaurant or takeout food only ~1 X wk.   Usual physical activity includes used to do 16-wk program at [facility].  Has [employer], and recently signed up at [employer].  Hope to restart water aerobics.    24-hr recall: (Up at 8 AM) B (9 AM)-   1 pkt flavored inst oatmeal, 1 c coffee, 2-3 T flavored creamer Snk ( AM)-   water L ( PM)-  1/2 pecans Snk ( PM)-  --- D (4 PM)-  1/2 sandwich ([place], ham, chs, bacon, mayo), water Snk ( PM)-  --- Typical day? Yes.    Progress Towards Goal(s):  In progress.   Nutritional Diagnosis:  NB-1.1 Food and nutrition-related knowledge deficit As related to weight management.  As evidenced by self-report of lack of understanding in this area.    Intervention:  Nutrition education.  Handouts given during visit include:  AVS  Goals Sheet  Demonstrated degree of understanding via:  Teach Back  Barriers to learning/adherence to lifestyle change: Medicare will not cover MNT, so patient will not be returning for follow-up.    Monitoring/Evaluation:  Follow-up with Dr. Alyson [name] at [facility] [place].

## 2015-08-15 ENCOUNTER — Other Ambulatory Visit: Payer: Medicare Other

## 2015-08-15 ENCOUNTER — Ambulatory Visit: Payer: Medicare Other | Admitting: Oncology

## 2015-08-19 ENCOUNTER — Telehealth: Payer: Self-pay

## 2015-08-19 NOTE — Telephone Encounter (Signed)
Patient called with "new" right sided hip pain that shoots down her right leg.  She's is wondering whether tramadol might help.

## 2015-10-07 ENCOUNTER — Other Ambulatory Visit: Payer: Self-pay

## 2015-10-07 DIAGNOSIS — C50512 Malignant neoplasm of lower-outer quadrant of left female breast: Secondary | ICD-10-CM

## 2015-10-08 ENCOUNTER — Ambulatory Visit (HOSPITAL_BASED_OUTPATIENT_CLINIC_OR_DEPARTMENT_OTHER): Payer: Medicare Other | Admitting: Oncology

## 2015-10-08 ENCOUNTER — Other Ambulatory Visit (HOSPITAL_BASED_OUTPATIENT_CLINIC_OR_DEPARTMENT_OTHER): Payer: Medicare Other

## 2015-10-08 ENCOUNTER — Telehealth: Payer: Self-pay | Admitting: Oncology

## 2015-10-08 VITALS — BP 140/71 | HR 78 | Temp 98.0°F | Resp 18 | Ht 66.5 in | Wt 230.6 lb

## 2015-10-08 DIAGNOSIS — C50512 Malignant neoplasm of lower-outer quadrant of left female breast: Secondary | ICD-10-CM

## 2015-10-08 LAB — CBC WITH DIFFERENTIAL/PLATELET
BASO%: 0.5 % (ref 0.0–2.0)
Basophils Absolute: 0 10*3/uL (ref 0.0–0.1)
EOS%: 1.7 % (ref 0.0–7.0)
Eosinophils Absolute: 0.1 10*3/uL (ref 0.0–0.5)
HEMATOCRIT: 40.1 % (ref 34.8–46.6)
HEMOGLOBIN: 13.5 g/dL (ref 11.6–15.9)
LYMPH#: 1.5 10*3/uL (ref 0.9–3.3)
LYMPH%: 36.6 % (ref 14.0–49.7)
MCH: 29.7 pg (ref 25.1–34.0)
MCHC: 33.7 g/dL (ref 31.5–36.0)
MCV: 88.1 fL (ref 79.5–101.0)
MONO#: 0.3 10*3/uL (ref 0.1–0.9)
MONO%: 7.8 % (ref 0.0–14.0)
NEUT%: 53.4 % (ref 38.4–76.8)
NEUTROS ABS: 2.3 10*3/uL (ref 1.5–6.5)
PLATELETS: 249 10*3/uL (ref 145–400)
RBC: 4.55 10*6/uL (ref 3.70–5.45)
RDW: 14.7 % — ABNORMAL HIGH (ref 11.2–14.5)
WBC: 4.2 10*3/uL (ref 3.9–10.3)

## 2015-10-08 LAB — COMPREHENSIVE METABOLIC PANEL
ALBUMIN: 3.5 g/dL (ref 3.5–5.0)
ALK PHOS: 82 U/L (ref 40–150)
ALT: 19 U/L (ref 0–55)
AST: 22 U/L (ref 5–34)
Anion Gap: 9 mEq/L (ref 3–11)
BILIRUBIN TOTAL: 0.48 mg/dL (ref 0.20–1.20)
BUN: 14.7 mg/dL (ref 7.0–26.0)
CALCIUM: 9.9 mg/dL (ref 8.4–10.4)
CO2: 27 mEq/L (ref 22–29)
CREATININE: 0.9 mg/dL (ref 0.6–1.1)
Chloride: 107 mEq/L (ref 98–109)
EGFR: 75 mL/min/{1.73_m2} — ABNORMAL LOW (ref >90)
Glucose: 105 mg/dl (ref 70–140)
POTASSIUM: 3.3 meq/L — AB (ref 3.5–5.1)
Sodium: 143 mEq/L (ref 136–145)
TOTAL PROTEIN: 7.4 g/dL (ref 6.4–8.3)

## 2015-10-08 MED ORDER — CLONIDINE HCL 0.1 MG/24HR TD PTWK: 0.1000 mg | MEDICATED_PATCH | TRANSDERMAL | Status: DC

## 2015-10-08 NOTE — Telephone Encounter (Signed)
Gave patient avs report and appointments for October, including mammo for 10/23 @ [facility].

## 2015-10-08 NOTE — Progress Notes (Signed)
[facility]  [phone] [phone]     ID: Monica [name] DOB: [birth year]  MR#: [id number]  MGN#:[id number]  Patient Care Team: Maury Dus, MD as PCP - General (Family Medicine) Excell Seltzer, MD as Consulting Physician (General Surgery) Chauncey Cruel, MD as Consulting Physician (Oncology) Kyung Rudd, MD as Consulting Physician (Radiation Oncology) Maisie Fus, MD as Consulting Physician (Obstetrics and Gynecology) Kennith Center, RD as Dietitian (Family Medicine) Marlaine Hind, MD as Consulting Physician (Physical Medicine and Rehabilitation)   CHIEF COMPLAINT: Newly diagnosed breast cancer  CURRENT TREATMENT:  observation  BREAST CANCER HISTORY: From the original intake note:  [name] showing a possible mass in her left breast. On 01/05/2014 left diagnostic mammography and ultrasonography at the breast Center showed 2 adjacent poorly defined masses deep in the lower outer portion of the left breast. On physical exam these were palpable measuring approximately 3 cm altogether, at the 4:00 position 16 cm from the left nipple. There were no palpable left axillary lymph nodes. Ultrasound confirmed to and adjacent irregularly marginated hypoechoic masses in the area in question, measuring 1.5 and 1.0 cm respectively. The total area of by ultrasonography measured 2.6 cm. In addition, to left axillary lymph nodes showed mild cortical thickening in one of them showed loss of the normal fatty hilum.  On 01/10/2014 the patient underwent separate biopsies of both the left breast masses in question as well as the more suspicious lymph node. The final pathology ([id number]) showed the lymph node to be benign. (This was interpreted as concordant). Both of the breast masses, however, were positive for an invasive ductal carcinoma, which was HER-2 not amplified, with the signals ratio of 0.98 and  the number per cell being 2.45. Estrogen and progesterone receptor determination is pending but looks negative at this point.  On 01/16/2014 the patient underwent bilateral breast MRI the right breast was unremarkable. In the left breast lower outer quadrant there were 2 oval masses measuring 1.5 and 2.2 cm. Taken together they was approximately 4 cm of disease. There were no findings of concern in the right breast. In the left axilla there was a single mildly prominent lymph node. There was no suspicious internal mammary adenopathy.  The patient's subsequent history is as detailed below  INTERVAL HISTORY: Monica [name] returns today for follow up of her "triple negative" breast cancer accompanied by her husband Monica [name]. Since last visit here she went to the pain clinic and received some epidural shots under Dr. Brien Few. This was very effective. Her back pain is now much better. As a result she took herself off tramadol and at the same time she took herself off venlafaxine.  She did not notice any problems for about 3 days but then she had a whole variety of symptoms including dizziness, feeling like there was a noise in her head whenever she blinks, visual changes, and vomiting. Those symptoms have now largely resolved.  Incidentally she tells me her hot flashes never got much better on the venlafaxine and have not been much worse off it  REVIEW OF SYSTEMS: Monica [name] has some seasonal allergies, with a runny nose, hoarseness, and a dry cough. She bruises easily. She still has back and joint pain but it is much better even off the tramadol and it improves with activity. She tells me she is normally active. There is a new lesion on the roof of her mouth which she tells me is going  to be removed by an oral Psychologist, sport and exercise. Aside from that a detailed review of systems today was stable  PAST MEDICAL HISTORY: Past Medical History  Diagnosis Date  . Allergy codeine    rapid heart beat, cold sweat  . Arthritis   .  Hypertension   . Breast cancer ([place]) 01/10/14    Left breast  . S/P radiation therapy 09/21/14 completed    left breast    PAST SURGICAL HISTORY: Past Surgical History  Procedure Laterality Date  . Knee arthroscopy  2012    right  . Lumbar laminectomy  1991  . Colonoscopy    . Foot arthroplasty  1998    right  . Abdominal hysterectomy  1992  . Breast lumpectomy with needle localization and axillary sentinel lymph node bx Left 02/12/2014    Procedure: LEFT BREAST LUMPECTOMY WITH NEEDLE LOCALIZATION AND LEFT AXILLARY SENTINEL LYMPH NODE BX;  Surgeon: [name], MD;  Location: [facility];  Service: General;  Laterality: Left;  . Portacath placement Right 02/12/2014    Procedure: INSERTION PORT-A-CATH;  Surgeon: [name], MD;  Location: [facility];  Service: General;  Laterality: Right;    FAMILY HISTORY Family History  Problem Relation Age of Onset  . Cancer Sister   . Cancer Brother   . Cancer Maternal Aunt   . Cancer Paternal Aunt    the patient's father died at the age of 2 from a stroke. The patient's mother died at the age of 61 from "multiple causes". The patient had 2 brothers and 2 sisters. The patient's sister was diagnosed with uterine cancer at the age of 51, and the patient's brother Monica [name] had "bone cancer" and died in his 61s. He was treated at the [facility]. There is no history of breast or ovarian cancer in the family to the patient's knowledge.  GYNECOLOGIC HISTORY:  No LMP recorded. Patient has had a hysterectomy. Menarche age 86, the patient is GX P0. She underwent hysterectomy in 1992, with no salpingo-oophorectomy. She has been on estrogen replacement since. 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 Her husband Monica [name] worked for [employer] in [place] as a Facilities manager. 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 The patient's god-daughter Monica [name] is an Optometrist. The patient has no grandchildren. The patient attends a local church of  [place], and her husband Monica [name] is pastor   ADVANCED DIRECTIVES: In place   HEALTH MAINTENANCE: Social History  Substance Use Topics  . Smoking status: Never Smoker   . Smokeless tobacco: Never Used  . Alcohol Use: No     Colonoscopy: October 2008  PAP: July 2015  Bone density: 01/01/2014  Lipid panel:  Allergies  Allergen Reactions  . Codeine Other (See Comments)    Heart fast    Current Outpatient Prescriptions  Medication Sig Dispense Refill  . amLODipine (NORVASC) 5 MG tablet Take 5 mg by mouth daily.    . Cholecalciferol (VITAMIN D3) 2000 UNITS TABS Take 1 tablet by mouth daily.    [name] docusate sodium (COLACE) 100 MG capsule Take 100 mg by mouth 1 day or 1 dose.     . gabapentin (NEURONTIN) 300 MG capsule Take 1 capsule (300 mg total) by mouth at bedtime. 90 capsule 2  . Ginkgo Biloba 120 MG CAPS Take 1  capsule by mouth daily.    . meloxicam (MOBIC) 15 MG tablet Take 15 mg by mouth daily.    . Omega-3 Fatty Acids (FISH OIL CONCENTRATE) 300 MG CAPS Take by mouth.    . valsartan-hydrochlorothiazide (DIOVAN-HCT) 320-25 MG per tablet Take 1 tablet by mouth daily.     No current facility-administered medications for this visit.    OBJECTIVE: Middle-aged [place] American woman Who appears stated age 65 Vitals:   10/08/15 1041  BP: 140/71  Pulse: 78  Temp: 98 F (36.7 C)  Resp: 18     Body mass index is 36.67 kg/(m^2).    ECOG FS:1 - Symptomatic but completely ambulatory  Sclerae unicteric, pupils round and equal Oropharynx shows a torus which appears normal except for a 2 mm lesion which is not erythematous No cervical or supraclavicular adenopathy Lungs no rales or rhonchi Heart regular rate and rhythm Abd soft, obese, nontender, positive bowel  sounds MSK no focal spinal tenderness to moderate palpation, no upper extremity lymphedema Neuro: nonfocal, well oriented, positive affect Breasts: The right breast is unremarkable. The left breast is status post lumpectomy and radiation. There is no evidence of local recurrence. The left axilla is benign.   LAB RESULTS:  CMP     Component Value Date/Time   NA 143 10/08/2015 1030   NA 141 04/18/2014 0444   K 3.3* 10/08/2015 1030   K 3.7 04/18/2014 0444   CL 106 04/18/2014 0444   CO2 27 10/08/2015 1030   CO2 24 04/18/2014 0444   GLUCOSE 105 10/08/2015 1030   GLUCOSE 127* 04/18/2014 0444   BUN 14.7 10/08/2015 1030   BUN 6 04/18/2014 0444   CREATININE 0.9 10/08/2015 1030   CREATININE 0.90 11/08/2014 0725   CALCIUM 9.9 10/08/2015 1030   CALCIUM 8.7 04/18/2014 0444   PROT 7.4 10/08/2015 1030   PROT 5.8* 04/16/2014 0515   ALBUMIN 3.5 10/08/2015 1030   ALBUMIN 2.6* 04/16/2014 0515   AST 22 10/08/2015 1030   AST 12 04/16/2014 0515   ALT 19 10/08/2015 1030   ALT 15 04/16/2014 0515   ALKPHOS 82 10/08/2015 1030   ALKPHOS 90 04/16/2014 0515   BILITOT 0.48 10/08/2015 1030   BILITOT 0.5 04/16/2014 0515   GFRNONAA >90 04/18/2014 0444   GFRAA >90 04/18/2014 0444    I No results found for: SPEP  Lab Results  Component Value Date   WBC 4.2 10/08/2015   NEUTROABS 2.3 10/08/2015   HGB 13.5 10/08/2015   HCT 40.1 10/08/2015   MCV 88.1 10/08/2015   PLT 249 10/08/2015      Chemistry      Component Value Date/Time   NA 143 10/08/2015 1030   NA 141 04/18/2014 0444   K 3.3* 10/08/2015 1030   K 3.7 04/18/2014 0444   CL 106 04/18/2014 0444   CO2 27 10/08/2015 1030   CO2 24 04/18/2014 0444   BUN 14.7 10/08/2015 1030   BUN 6 04/18/2014 0444   CREATININE 0.9 10/08/2015 1030   CREATININE 0.90 11/08/2014 0725      Component Value Date/Time   CALCIUM 9.9 10/08/2015 1030   CALCIUM 8.7 04/18/2014 0444   ALKPHOS 82 10/08/2015 1030   ALKPHOS 90 04/16/2014 0515   AST 22 10/08/2015  1030   AST 12 04/16/2014 0515   ALT 19 10/08/2015 1030   ALT 15 04/16/2014 0515   BILITOT 0.48 10/08/2015 1030   BILITOT 0.5 04/16/2014 0515       No results found for: LABCA2  No components found for: LABCA125  No results for input(s): INR in the last 168 hours.  Urinalysis    Component Value Date/Time   COLORURINE YELLOW 04/15/2014 1814    STUDIES:  CLINICAL DATA: Patient is a currently asymptomatic 70 year old female with a personal history of left breast cancer status post conservation therapy with lumpectomy and radiation in 2015.  EXAM: DIGITAL DIAGNOSTIC BILATERAL MAMMOGRAM WITH 3D TOMOSYNTHESIS AND CAD  COMPARISON: Previous exam(s).  ACR Breast Density Category b: There are scattered areas of fibroglandular density.  FINDINGS: Digital bilateral diagnostic mammography demonstrates scattered fibroglandular densities. Expected postsurgical scarring is identified in the lateral left breast at site of lumpectomy. Postsurgical changes also noted in the left axillary region. There is mild diffuse skin thickening of the left breast as well as left breast interstitium consistent with sequelae of radiation. Spot-compression magnification views of the lumpectomy bed demonstrate postsurgical change and effacement of normal fibroglandular tissue. Scattered benign high-density calcifications are again noted bilaterally.  Mammographic images were processed with CAD.  IMPRESSION: Mammographic changes of left breast conservation therapy. No mammographic findings of malignancy.  RECOMMENDATION: Diagnostic mammogram is suggested in 1 year. (Code:DM-B-01Y)  I have discussed the findings and recommendations with the patient. Results were also provided in writing at the conclusion of the visit. If applicable, a reminder letter will be sent to the patient regarding the next appointment.  BI-RADS CATEGORY 2: Benign.   Electronically Signed  By: Andres Shad  On: 04/11/2015 11:09  ASSESSMENT: 70 y.o. [place] woman status post left breast biopsy of 2 separate lower outer quadrant masses 01/10/2014 for a multifocal cT2 cNX, stage II invasive ductal carcinoma, grade 3, with an MIB-1 of 90%, HER-2 negative, estrogen receptors 3% positive with weak staining, progesterone receptor negative, with an MIB-1 of 90%; this is clinically a "triple-negative" breast cancer and will be treated as such  (1) a suspicious left axillary lymph node biopsied 01/10/2014 was negative (concordant)  (2) left lumpectomy and sentinel lymph node sampling 02/12/2014 showed an mpT2 pN0, stage IIA invasive ductal carcinoma grade 3, repeat HER-2 again negative  (3) adjuvant chemotherapy consisted of  (a) dose dense doxorubicin and cyclophosphamide x4 completed 05/02/2014  (b) weekly paclitaxel and carboplatin with 3 of 12 doses given, then stopped because of neuropathy  (c) Carboplatin and Gemzar started 06/11/2014, given on day 1 & day 8 of a 21 days cycle for 3 cycles, completed 07/30/2014  (4) adjuvant radiation 08/27/2014 through 09/21/2014:  The patient initially received a dose of 42.5 Gy in 17 fractions to the left breast using whole-breast tangent fields. This was delivered using a 3-D conformal technique. The patient then received a boost to the seroma. This delivered an additional 7.5 Gy in 3 fractions using a 3 field photon technique due to the depth of the seroma. The total dose was 50 Gy.  (5) tamoxifen started (prophylactically) in December 2016; stopped after 2 weeks due to side effects.   PLAN:  I think all the problems Saranne has been having are due to getting off the venlafaxine without tapering off. She is now 5 days out, so she is over that problem.  She does not find that the hot flashes aren't any worse and they really did not improve very much with the venlafaxine, so we are not going back on that medication.  Instead we discussed TTS-1 patches.  She has a good understanding of the possible toxicities, side effects and complications of that agent. I think it may be  helpful for her blood pressure but also for hot flashes and I went ahead and placed the prescription in for her. She will let me know if that causes her any problems.  She has done a wonderful job of using the supportive services we have here, participating in finding a new normal, the [place] program, and of course physical therapy. She is now planning to use the [facility] side service.  Today I gave her information on how to volunteer.  She is going to see me again late October, after her next mammogram. From that visit I will start seeing her on a yearly basis.  Chauncey Cruel, MD 10/08/2015 11:19 AM

## 2015-11-26 ENCOUNTER — Telehealth: Payer: Self-pay | Admitting: *Deleted

## 2015-11-26 DIAGNOSIS — C50512 Malignant neoplasm of lower-outer quadrant of left female breast: Secondary | ICD-10-CM

## 2015-11-26 MED ORDER — CLONIDINE HCL 0.1 MG/24HR TD PTWK: 0.1000 mg | MEDICATED_PATCH | TRANSDERMAL | Status: DC

## 2015-11-26 NOTE — Telephone Encounter (Signed)
"  I can receive a three month supply of my hot flas patches cheaper through [facility].  Could my order be sent for July.  I just picked up four for the month of June."  Will send order with start date December 21, 2015.  Order sent as previous electronic order to local pharmacy was for a year supply.

## 2015-12-02 ENCOUNTER — Telehealth: Payer: Self-pay

## 2015-12-02 DIAGNOSIS — C50512 Malignant neoplasm of lower-outer quadrant of left female breast: Secondary | ICD-10-CM

## 2015-12-02 MED ORDER — CLONIDINE HCL 0.1 MG/24HR TD PTWK: 0.1000 mg | MEDICATED_PATCH | TRANSDERMAL | Status: DC

## 2015-12-02 NOTE — Telephone Encounter (Signed)
Pt called requesting a 3 month supply rx for clonidine be [facility]. B/c original rx was for 12 refills, order changed for 3 month supply and 3 refills.

## 2015-12-16 ENCOUNTER — Telehealth: Payer: Self-pay | Admitting: *Deleted

## 2015-12-16 NOTE — Telephone Encounter (Signed)
Pt called and left message requesting a call back from Dr. Virgie Dad nurse for clarification of medications.   Attempted to call pt back unsuccessfully.   Left message on voice mail requesting a call back from pt. Pt's    Phone     [phone].

## 2015-12-16 NOTE — Telephone Encounter (Signed)
"  I called earlier and missed a Triage return call.  I have a question about trouble I'm having with my knees.  My husband ordered '[employer]'.  I can bring it by for you all to see the ingredients if needed.  [employer] in [place] told me to call to make sure it does not interfere with the Gingko Biloba, clonidine or gabapentin cancer medications.  Return number is [phone] or mobile [phone]."

## 2015-12-17 NOTE — Telephone Encounter (Signed)
This RN attempted to google supplement named for ingrediants- but was unable to locate any information.  Call placed to pt and obtained VM- message left stating above and need for list of ingredients for review.   This RN's name given for return call.

## 2016-01-16 ENCOUNTER — Emergency Department (HOSPITAL_COMMUNITY)
Admission: EM | Admit: 2016-01-16 | Discharge: 2016-01-16 | Disposition: A | Discharge status: Home / Self Care | Payer: Medicare Other | Attending: Physician Assistant | Admitting: Physician Assistant

## 2016-01-16 ENCOUNTER — Encounter (HOSPITAL_COMMUNITY): Payer: Self-pay | Admitting: Emergency Medicine

## 2016-01-16 DIAGNOSIS — N390 Urinary tract infection, site not specified: Secondary | ICD-10-CM | POA: Diagnosis not present

## 2016-01-16 DIAGNOSIS — Z79899 Other long term (current) drug therapy: Secondary | ICD-10-CM | POA: Insufficient documentation

## 2016-01-16 DIAGNOSIS — I10 Essential (primary) hypertension: Secondary | ICD-10-CM | POA: Insufficient documentation

## 2016-01-16 DIAGNOSIS — R300 Dysuria: Secondary | ICD-10-CM | POA: Diagnosis present

## 2016-01-16 DIAGNOSIS — Z853 Personal history of malignant neoplasm of breast: Secondary | ICD-10-CM | POA: Diagnosis not present

## 2016-01-16 LAB — URINE MICROSCOPIC-ADD ON

## 2016-01-16 LAB — URINALYSIS, ROUTINE W REFLEX MICROSCOPIC
Glucose, UA: NEGATIVE mg/dL
KETONES UR: NEGATIVE mg/dL
NITRITE: POSITIVE — AB
Protein, ur: 30 mg/dL — AB
Specific Gravity, Urine: 1.026 (ref 1.005–1.030)
pH: 5.5 (ref 5.0–8.0)

## 2016-01-16 MED ORDER — CEPHALEXIN 500 MG PO CAPS
500.0000 mg | ORAL_CAPSULE | Freq: Once | ORAL | Status: AC
  Administered 2016-01-16: 500 mg via ORAL
  Filled 2016-01-16: qty 1

## 2016-01-16 MED ORDER — ONDANSETRON HCL 4 MG PO TABS: 4.0000 mg | ORAL_TABLET | Freq: Three times a day (TID) | ORAL | 0 refills | Status: DC | PRN

## 2016-01-16 MED ORDER — CEPHALEXIN 500 MG PO CAPS: 500.0000 mg | ORAL_CAPSULE | Freq: Four times a day (QID) | ORAL | 0 refills | Status: DC

## 2016-01-16 MED ORDER — ONDANSETRON HCL 4 MG PO TABS
4.0000 mg | ORAL_TABLET | Freq: Once | ORAL | Status: AC
  Administered 2016-01-16: 4 mg via ORAL
  Filled 2016-01-16: qty 1

## 2016-01-16 NOTE — Discharge Instructions (Signed)
Please folow up with your PCP. Return if you are unable to take your antibitoics.

## 2016-01-16 NOTE — ED Notes (Signed)
Pt tolerating fluids.   

## 2016-01-16 NOTE — ED Triage Notes (Signed)
Pt reports intermittent episodes of chills today accompanied by generalized body aches. Had hematuria last night. Hx of cancer, but has been in remission for a year.

## 2016-01-16 NOTE — ED Provider Notes (Signed)
[facility] DEPT Provider Note   CSN: [id number] Arrival date & time: 01/16/16  1724  First Provider Contact:  None       History   Chief Complaint Chief Complaint  Patient presents with  . Hematuria  . Generalized Body Aches    HPI Monica [name] is a 70 y.o. female.  HPI   Patient is a 70 year old female presenting with 2 days of feeling unwell, fever, hematuria, burning when urinating. Patient has been able to tolerate by mouth.   No vomiting no abdominal pain or chest pain or shortness of breath.  Past Medical History:  Diagnosis Date  . Allergy codeine   rapid heart beat, cold sweat  . Arthritis   . Breast cancer ([place]) 01/10/14   Left breast  . Hypertension   . S/P radiation therapy 09/21/14 completed   left breast    Patient Active Problem List   Diagnosis Date Noted  . Hot flashes 04/05/2015  . Other pancytopenia ([place]) 06/28/2014  . Corneal abrasion 06/27/2014  . Anemia associated with chemotherapy 06/25/2014  . Thrombocytopenia ([place]) 06/25/2014  . Chemotherapy-induced neuropathy ([place]) 06/04/2014  . Decreased appetite 05/28/2014  . Body aches 05/28/2014  . Dyspepsia 05/28/2014  . Insomnia 05/02/2014  . UTI (urinary tract infection) 04/17/2014  . Febrile neutropenia ([place]) 04/15/2014  . Hypokalemia 04/15/2014  . Hypertension 04/15/2014  . Itchy eyes 04/09/2014  . Heart palpitations 04/04/2014  . Nausea without vomiting 03/26/2014  . Heartburn 03/26/2014  . Thrush 03/26/2014  . Breast cancer of lower-outer quadrant of left female breast ([place]) 01/12/2014    Past Surgical History:  Procedure Laterality Date  . ABDOMINAL HYSTERECTOMY  1992  . BREAST LUMPECTOMY WITH NEEDLE LOCALIZATION AND AXILLARY SENTINEL LYMPH NODE BX Left 02/12/2014   Procedure: LEFT BREAST LUMPECTOMY WITH NEEDLE LOCALIZATION AND LEFT AXILLARY SENTINEL LYMPH NODE BX;  Surgeon: [name], MD;  Location: [facility];  Service: General;  Laterality: Left;    . COLONOSCOPY    . FOOT ARTHROPLASTY  1998   right  . KNEE ARTHROSCOPY  2012   right  . LUMBAR LAMINECTOMY  1991  . PORTACATH PLACEMENT Right 02/12/2014   Procedure: INSERTION PORT-A-CATH;  Surgeon: [name], MD;  Location: [facility];  Service: General;  Laterality: Right;    OB History    No data available       Home Medications    Prior to Admission medications   Medication Sig Start Date End Date Taking? Authorizing Provider  acetaminophen (TYLENOL) 500 MG tablet Take 1,000 mg by mouth every 6 (six) hours as needed for moderate pain.   Yes Historical Provider, MD  amLODipine (NORVASC) 5 MG tablet Take 5 mg by mouth daily.   Yes Historical Provider, MD  Cholecalciferol (VITAMIN D3) 2000 UNITS TABS Take 1 tablet by mouth daily.   Yes Historical Provider, MD  cloNIDine (CATAPRES-TTS-1) 0.1 mg/24hr patch Place 1 patch (0.1 mg total) onto the skin once a week. 12/21/15  Yes Chauncey Cruel, MD  docusate sodium (COLACE) 100 MG capsule Take 100 mg by mouth daily as needed for mild constipation.    Yes Historical Provider, MD  gabapentin (NEURONTIN) 300 MG capsule Take 1 capsule (300 mg total) by mouth at bedtime. 07/01/15  Yes [name] Panda, NP  Ginkgo Biloba 120 MG CAPS Take 1 capsule by mouth daily.   Yes Historical Provider, MD  meloxicam (MOBIC) 15 MG tablet Take 15 mg by mouth daily. 08/15/14  Yes Historical Provider, MD  Omega-3 Fatty Acids (FISH OIL CONCENTRATE) 300 MG CAPS Take by mouth.   Yes Historical Provider, MD  traMADol (ULTRAM) 50 MG tablet Take 50 mg by mouth every 6 (six) hours as needed for moderate pain.   Yes Historical Provider, MD  valsartan-hydrochlorothiazide (DIOVAN-HCT) 320-25 MG per tablet Take 1 tablet by mouth daily.   Yes Historical Provider, MD  cephALEXin (KEFLEX) 500 MG capsule Take 1 capsule (500 mg total) by mouth 4 (four) times daily. 01/16/16   Aryaman Haliburton Lyn Conni Knighton, MD  ondansetron (ZOFRAN) 4 MG tablet Take 1 tablet (4 mg  total) by mouth every 8 (eight) hours as needed for nausea or vomiting. 01/16/16   Tyaira Heward Lyn Jazzmen Restivo, MD    Family History Family History  Problem Relation Age of Onset  . Cancer Sister   . Cancer Brother   . Cancer Maternal Aunt   . Cancer Paternal Aunt     Social History Social History  Substance Use Topics  . Smoking status: Never Smoker  . Smokeless tobacco: Never Used  . Alcohol use No     Allergies   Codeine   Review of Systems Review of Systems  Constitutional: Positive for chills and fever. Negative for activity change.  Respiratory: Negative for shortness of breath.   Cardiovascular: Negative for chest pain.  Gastrointestinal: Negative for abdominal pain.  Genitourinary: Positive for dysuria and urgency.  All other systems reviewed and are negative.    Physical Exam Updated Vital Signs BP 107/61   Pulse 75   Temp 98.2 F (36.8 C) (Oral)   Resp 18   Ht 5\' 6"  (1.676 m)   Wt 232 lb (105.2 kg)   SpO2 100%   BMI 37.45 kg/m   Physical Exam  Constitutional: She is oriented to person, place, and time. She appears well-developed and well-nourished.  obese  HENT:  Head: Normocephalic and atraumatic.  Eyes: Right eye exhibits no discharge.  Cardiovascular: Normal rate, regular rhythm and normal heart sounds.   No murmur heard. Pulmonary/Chest: Effort normal and breath sounds normal. She has no wheezes. She has no rales.  Abdominal: Soft. She exhibits no distension. There is no tenderness.  Neurological: She is oriented to person, place, and time.  Skin: Skin is warm and dry. She is not diaphoretic.  Psychiatric: She has a normal mood and affect.  Nursing note and vitals reviewed.    ED Treatments / Results  Labs (all labs ordered are listed, but only abnormal results are displayed) Labs Reviewed  URINALYSIS, ROUTINE W REFLEX MICROSCOPIC (NOT AT [facility]) - Abnormal; Notable for the following:       Result Value   Color, Urine AMBER (*)     APPearance CLOUDY (*)    Hgb urine dipstick MODERATE (*)    Bilirubin Urine SMALL (*)    Protein, ur 30 (*)    Nitrite POSITIVE (*)    Leukocytes, UA MODERATE (*)    All other components within normal limits  URINE MICROSCOPIC-ADD ON - Abnormal; Notable for the following:    Squamous Epithelial / LPF 6-30 (*)    Bacteria, UA FEW (*)    All other components within normal limits    EKG  EKG Interpretation None       Radiology No results found.  Procedures Procedures (including critical care time)  Medications Ordered in ED Medications  cephALEXin (KEFLEX) capsule 500 mg (500 mg Oral Given 01/16/16 2116)  ondansetron (ZOFRAN) tablet 4 mg (4 mg Oral  Given 01/16/16 2118)     Initial Impression / Assessment and Plan / ED Course  I have reviewed the triage vital signs and the nursing notes.  Pertinent labs & imaging results that were available during my care of the patient were reviewed by me and considered in my medical decision making (see chart for details).  Clinical Course    Patient is a 47 -year-old female presenting with chills fever and urinary symptoms. Patient found to have urinary check infection. She has not had any symptoms consistent with stone. Patient was given option to IV fluid hydration or by mouth. Patient like to 2 by mouth this time. We will attempt by mouth fluids and by mouth antibiotics.  Pt appears very well, normal vitals.   Patietn took PO without issue.  Will discharge with return precautions.   Final Clinical Impressions(s) / ED Diagnoses   Final diagnoses:  UTI (lower urinary tract infection)    New Prescriptions Discharge Medication List as of 01/16/2016 10:15 PM    START taking these medications   Details  cephALEXin (KEFLEX) 500 MG capsule Take 1 capsule (500 mg total) by mouth 4 (four) times daily., Starting Thu 01/16/2016, Print    ondansetron (ZOFRAN) 4 MG tablet Take 1 tablet (4 mg total) by mouth every 8 (eight) hours as needed  for nausea or vomiting., Starting Thu 01/16/2016, Print         Myrakle Wingler Julio Alm, MD 01/16/16 2325

## 2016-03-16 ENCOUNTER — Other Ambulatory Visit: Payer: Self-pay | Admitting: *Deleted

## 2016-03-16 DIAGNOSIS — C50512 Malignant neoplasm of lower-outer quadrant of left female breast: Secondary | ICD-10-CM

## 2016-03-16 MED ORDER — GABAPENTIN 300 MG PO CAPS: 300.0000 mg | ORAL_CAPSULE | Freq: Every day | ORAL | 0 refills | Status: DC

## 2016-03-19 ENCOUNTER — Other Ambulatory Visit: Payer: Self-pay | Admitting: *Deleted

## 2016-04-09 ENCOUNTER — Other Ambulatory Visit: Payer: Self-pay | Admitting: *Deleted

## 2016-04-09 DIAGNOSIS — C50512 Malignant neoplasm of lower-outer quadrant of left female breast: Secondary | ICD-10-CM

## 2016-04-13 ENCOUNTER — Other Ambulatory Visit: Payer: Self-pay | Admitting: Oncology

## 2016-04-13 ENCOUNTER — Other Ambulatory Visit (HOSPITAL_BASED_OUTPATIENT_CLINIC_OR_DEPARTMENT_OTHER): Payer: Medicare Other

## 2016-04-13 ENCOUNTER — Ambulatory Visit
Admission: RE | Admit: 2016-04-13 | Discharge: 2016-04-13 | Disposition: A | Discharge status: Home / Self Care | Payer: Medicare Other | Source: Ambulatory Visit | Attending: Oncology | Admitting: Oncology

## 2016-04-13 DIAGNOSIS — C50512 Malignant neoplasm of lower-outer quadrant of left female breast: Secondary | ICD-10-CM

## 2016-04-13 LAB — COMPREHENSIVE METABOLIC PANEL
ALT: 21 U/L (ref 0–55)
AST: 24 U/L (ref 5–34)
Albumin: 3.5 g/dL (ref 3.5–5.0)
Alkaline Phosphatase: 112 U/L (ref 40–150)
Anion Gap: 8 mEq/L (ref 3–11)
BUN: 22.3 mg/dL (ref 7.0–26.0)
CO2: 28 meq/L (ref 22–29)
Calcium: 9.9 mg/dL (ref 8.4–10.4)
Chloride: 105 mEq/L (ref 98–109)
Creatinine: 1 mg/dL (ref 0.6–1.1)
EGFR: 70 mL/min/{1.73_m2} — AB (ref >90)
GLUCOSE: 86 mg/dL (ref 70–140)
POTASSIUM: 4 meq/L (ref 3.5–5.1)
SODIUM: 141 meq/L (ref 136–145)
Total Bilirubin: 0.29 mg/dL (ref 0.20–1.20)
Total Protein: 7.4 g/dL (ref 6.4–8.3)

## 2016-04-13 LAB — CBC WITH DIFFERENTIAL/PLATELET
BASO%: 1.4 % (ref 0.0–2.0)
BASOS ABS: 0.1 10*3/uL (ref 0.0–0.1)
EOS ABS: 0.1 10*3/uL (ref 0.0–0.5)
EOS%: 2.7 % (ref 0.0–7.0)
HCT: 39.7 % (ref 34.8–46.6)
HGB: 12.9 g/dL (ref 11.6–15.9)
LYMPH%: 45.1 % (ref 14.0–49.7)
MCH: 29.1 pg (ref 25.1–34.0)
MCHC: 32.5 g/dL (ref 31.5–36.0)
MCV: 89.4 fL (ref 79.5–101.0)
MONO#: 0.3 10*3/uL (ref 0.1–0.9)
MONO%: 7.8 % (ref 0.0–14.0)
NEUT%: 43 % (ref 38.4–76.8)
NEUTROS ABS: 1.7 10*3/uL (ref 1.5–6.5)
Platelets: 275 10*3/uL (ref 145–400)
RBC: 4.44 10*6/uL (ref 3.70–5.45)
RDW: 15.5 % — ABNORMAL HIGH (ref 11.2–14.5)
WBC: 3.9 10*3/uL (ref 3.9–10.3)
lymph#: 1.7 10*3/uL (ref 0.9–3.3)

## 2016-04-16 ENCOUNTER — Ambulatory Visit
Admission: RE | Admit: 2016-04-16 | Discharge: 2016-04-16 | Disposition: A | Discharge status: Home / Self Care | Payer: Medicare Other | Source: Ambulatory Visit | Attending: Oncology | Admitting: Oncology

## 2016-04-16 ENCOUNTER — Other Ambulatory Visit: Payer: Self-pay | Admitting: Oncology

## 2016-04-16 DIAGNOSIS — C50512 Malignant neoplasm of lower-outer quadrant of left female breast: Secondary | ICD-10-CM

## 2016-04-20 ENCOUNTER — Ambulatory Visit (HOSPITAL_BASED_OUTPATIENT_CLINIC_OR_DEPARTMENT_OTHER): Payer: Medicare Other | Admitting: Oncology

## 2016-04-20 VITALS — BP 154/68 | HR 67 | Temp 97.9°F | Resp 18 | Ht 66.0 in | Wt 231.4 lb

## 2016-04-20 DIAGNOSIS — C50512 Malignant neoplasm of lower-outer quadrant of left female breast: Secondary | ICD-10-CM

## 2016-04-20 DIAGNOSIS — Z170 Estrogen receptor positive status [ER+]: Principal | ICD-10-CM

## 2016-04-20 DIAGNOSIS — Z171 Estrogen receptor negative status [ER-]: Secondary | ICD-10-CM | POA: Diagnosis not present

## 2016-04-20 MED ORDER — CLONIDINE HCL 0.1 MG/24HR TD PTWK: 0.1000 mg | MEDICATED_PATCH | TRANSDERMAL | 3 refills | Status: AC

## 2016-04-20 MED ORDER — GABAPENTIN 300 MG PO CAPS: 300.0000 mg | ORAL_CAPSULE | Freq: Every day | ORAL | 0 refills | Status: DC

## 2016-04-20 NOTE — Progress Notes (Signed)
[facility]  [phone] Fax:(336) [phone]     ID: Monica [name] DOB: December 17, 1945  MR#: [id number]  DXA#:[id number]  Patient Care Team: Maury Dus, MD as PCP - General (Family Medicine) Excell Seltzer, MD as Consulting Physician (General Surgery) Chauncey Cruel, MD as Consulting Physician (Oncology) Kyung Rudd, MD as Consulting Physician (Radiation Oncology) Maisie Fus, MD as Consulting Physician (Obstetrics and Gynecology) Kennith Center, RD as Dietitian (Family Medicine) Marlaine Hind, MD as Consulting Physician (Physical Medicine and Rehabilitation)   CHIEF COMPLAINT: Essentially triple negative breast cancer (very weak estrogen positivity). Her  CURRENT TREATMENT:  observation  BREAST CANCER HISTORY: From the original intake note:  [name] showing a possible mass in her left breast. On 01/05/2014 left diagnostic mammography and ultrasonography at the breast Center showed 2 adjacent poorly defined masses deep in the lower outer portion of the left breast. On physical exam these were palpable measuring approximately 3 cm altogether, at the 4:00 position 16 cm from the left nipple. There were no palpable left axillary lymph nodes. Ultrasound confirmed to and adjacent irregularly marginated hypoechoic masses in the area in question, measuring 1.5 and 1.0 cm respectively. The total area of by ultrasonography measured 2.6 cm. In addition, to left axillary lymph nodes showed mild cortical thickening in one of them showed loss of the normal fatty hilum.  On 01/10/2014 the patient underwent separate biopsies of both the left breast masses in question as well as the more suspicious lymph node. The final pathology ([id number]) showed the lymph node to be benign. (This was interpreted as concordant). Both of the breast masses, however, were positive for an invasive ductal carcinoma, which was HER-2  not amplified, with the signals ratio of 0.98 and the number per cell being 2.45. Estrogen and progesterone receptor determination is pending but looks negative at this point.  On 01/16/2014 the patient underwent bilateral breast MRI the right breast was unremarkable. In the left breast lower outer quadrant there were 2 oval masses measuring 1.5 and 2.2 cm. Taken together they was approximately 4 cm of disease. There were no findings of concern in the right breast. In the left axilla there was a single mildly prominent lymph node. There was no suspicious internal mammary adenopathy.  The patient's subsequent history is as detailed below  INTERVAL HISTORY: Monica [name] returns today for follow up of her left breast cancer. She just had her repeat mammography in October and there was an area of concern in the right breast which was biopsied and was benign.  REVIEW OF SYSTEMS: Monica [name] is generally doing well and she usually gets up around 5:30 or 6 in the morning and goes to her church [employer] for an hour. She then makes breakfast for her husband does all her housework. By 3-D and though she is very tired, and takes a nap which sometimes last into the evening. Her husband does wake her up at 10 PM which is when she has her gabapentin and Benadryl and then she sleeps until the next morning. She is going to the gym 3 times a week and says that does help her have more energy. She has some arthritis pains here and there which are not more intense or persistent than before. She still has hot flashes, although she says the T2 has one patch is helpful. Aside from these issues a detailed review of systems today was stable  PAST MEDICAL HISTORY: Past Medical History:  Diagnosis Date  . Allergy codeine   rapid heart beat, cold sweat  . Arthritis   . Breast cancer ([name]) 01/10/14   Left breast  . Hypertension   . S/P radiation therapy 09/21/14 completed   left breast    PAST SURGICAL HISTORY: Past Surgical History:    Procedure Laterality Date  . ABDOMINAL HYSTERECTOMY  1992  . BREAST LUMPECTOMY WITH NEEDLE LOCALIZATION AND AXILLARY SENTINEL LYMPH NODE BX Left 02/12/2014   Procedure: LEFT BREAST LUMPECTOMY WITH NEEDLE LOCALIZATION AND LEFT AXILLARY SENTINEL LYMPH NODE BX;  Surgeon: [name], MD;  Location: [facility];  Service: General;  Laterality: Left;  . COLONOSCOPY    . FOOT ARTHROPLASTY  1998   right  . KNEE ARTHROSCOPY  2012   right  . LUMBAR LAMINECTOMY  1991  . PORTACATH PLACEMENT Right 02/12/2014   Procedure: INSERTION PORT-A-CATH;  Surgeon: [name], MD;  Location: [facility];  Service: General;  Laterality: Right;    FAMILY HISTORY Family History  Problem Relation Age of Onset  . Cancer Sister   . Cancer Brother   . Cancer Maternal Aunt   . Cancer Paternal Aunt    the patient's father died at the age of 75 from a stroke. The patient's mother died at the age of 9 from "multiple causes". The patient had 2 brothers and 2 sisters. The patient's sister was diagnosed with uterine cancer at the age of 30, and the patient's brother Monica [name] had "bone cancer" and died in his 30s. He was treated at the [facility]. There is no history of breast or ovarian cancer in the family to the patient's knowledge.  GYNECOLOGIC HISTORY:  No LMP recorded. Patient has had a hysterectomy. Menarche age 84, the patient is GX P0. She underwent hysterectomy in 1992, with no salpingo-oophorectomy. She has been on estrogen replacement since. 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 Her husband Monica [name] worked for [employer] in [place] as a Facilities manager. The patient's adopted son Monica [name] "Monica [name]" Monica [name] works as a laborer in [place]. The patient's god-daughter Monica [name] is an Optometrist. The patient has no grandchildren. The  patient attends a local church of  [place], and her husband Monica [name] is pastor   ADVANCED DIRECTIVES: In place   HEALTH MAINTENANCE: Social History  Substance Use Topics  . Smoking status: Never Smoker  . Smokeless tobacco: Never Used  . Alcohol use No     Colonoscopy: October 2008  PAP: July 2015  Bone density: 01/01/2014  Lipid panel:  Allergies  Allergen Reactions  . Codeine Other (See Comments)    Heart fast    Current Outpatient Prescriptions  Medication Sig Dispense Refill  . acetaminophen (TYLENOL) 500 MG tablet Take 1,000 mg by mouth every 6 (six) hours as needed for moderate pain.    [name] amLODipine (NORVASC) 5 MG tablet Take 5 mg by mouth daily.    . cephALEXin (KEFLEX) 500 MG capsule Take 1 capsule (500 mg total) by mouth 4 (four) times daily. 20 capsule 0  . Cholecalciferol (VITAMIN D3) 2000 UNITS TABS Take 1 tablet by mouth daily.    . cloNIDine (CATAPRES-TTS-1) 0.1 mg/24hr patch Place 1 patch (0.1 mg total) onto the skin once a week. 12 patch 3  . docusate sodium (COLACE) 100 MG capsule Take 100 mg by mouth daily as needed  for mild constipation.     . gabapentin (NEURONTIN) 300 MG capsule Take 1 capsule (300 mg total) by mouth at bedtime. 90 capsule 0  . Ginkgo Biloba 120 MG CAPS Take 1 capsule by mouth daily.    . meloxicam (MOBIC) 15 MG tablet Take 15 mg by mouth daily.    . Omega-3 Fatty Acids (FISH OIL CONCENTRATE) 300 MG CAPS Take by mouth.    . ondansetron (ZOFRAN) 4 MG tablet Take 1 tablet (4 mg total) by mouth every 8 (eight) hours as needed for nausea or vomiting. 11 tablet 0  . traMADol (ULTRAM) 50 MG tablet Take 50 mg by mouth every 6 (six) hours as needed for moderate pain.    . valsartan-hydrochlorothiazide (DIOVAN-HCT) 320-25 MG per tablet Take 1 tablet by mouth daily.     No current facility-administered medications for this visit.     OBJECTIVE: Middle-aged [place] American woman In no acute distress Vitals:   04/20/16 1336  BP: (!) 154/68    Pulse: 67  Resp: 18  Temp: 97.9 F (36.6 C)     Body mass index is 37.35 kg/m.    ECOG FS:1 - Symptomatic but completely ambulatory  Sclerae unicteric, pupils round and equal Oropharynx clear and moist-- no thrush or other lesions No cervical or supraclavicular adenopathy Lungs no rales or rhonchi Heart regular rate and rhythm Abd soft, nontender, positive bowel sounds MSK no focal spinal tenderness, no upper extremity lymphedema Neuro: nonfocal, well oriented, appropriate affect Breasts: The right breast is status post recent biopsy. It is otherwise unremarkable. The left breast is status post lumpectomy and radiation. There is no evidence of local recurrence. Left axilla is benign.   LAB RESULTS:  CMP     Component Value Date/Time   NA 141 04/13/2016 1218   K 4.0 04/13/2016 1218   CL 106 04/18/2014 0444   CO2 28 04/13/2016 1218   GLUCOSE 86 04/13/2016 1218   BUN 22.3 04/13/2016 1218   CREATININE 1.0 04/13/2016 1218   CALCIUM 9.9 04/13/2016 1218   PROT 7.4 04/13/2016 1218   ALBUMIN 3.5 04/13/2016 1218   AST 24 04/13/2016 1218   ALT 21 04/13/2016 1218   ALKPHOS 112 04/13/2016 1218   BILITOT 0.29 04/13/2016 1218   GFRNONAA >90 04/18/2014 0444   GFRAA >90 04/18/2014 0444    I No results found for: SPEP  Lab Results  Component Value Date   WBC 3.9 04/13/2016   NEUTROABS 1.7 04/13/2016   HGB 12.9 04/13/2016   HCT 39.7 04/13/2016   MCV 89.4 04/13/2016   PLT 275 04/13/2016      Chemistry      Component Value Date/Time   NA 141 04/13/2016 1218   K 4.0 04/13/2016 1218   CL 106 04/18/2014 0444   CO2 28 04/13/2016 1218   BUN 22.3 04/13/2016 1218   CREATININE 1.0 04/13/2016 1218      Component Value Date/Time   CALCIUM 9.9 04/13/2016 1218   ALKPHOS 112 04/13/2016 1218   AST 24 04/13/2016 1218   ALT 21 04/13/2016 1218   BILITOT 0.29 04/13/2016 1218       No results found for: LABCA2  No components found for: LABCA125  No results for input(s): INR in  the last 168 hours.  Urinalysis    Component Value Date/Time   COLORURINE AMBER (A) 01/16/2016 1751    STUDIES:Mm Diag Breast Tomo Bilateral  Result Date: 04/13/2016 CLINICAL DATA:  The patient has a history of a malignant lumpectomy  of the left breast in August 2015.Follow-up evaluation. EXAM: 2D DIGITAL DIAGNOSTIC BILATERAL MAMMOGRAM WITH CAD AND ADJUNCT TOMO COMPARISON:  Previous examinations the most recent which is dated 04/11/2015 and mammograms dating back to 12/16/2011. ACR Breast Density Category b: There are scattered areas of fibroglandular density. FINDINGS: There are stable scarring changes located within the lateral left breast related to the patient's lumpectomy. There are stable, coarse bilateral dystrophic calcifications present. There is a group of heterogeneous calcifications located within the upper-outer quadrant of the right breast which span 10 mm. There is a second adjacent smaller group of heterogeneous calcifications which span 5 mm located approximately 2 cm inferior and anterior to the larger group. Stereotactic biopsy of the larger group of calcifications is recommended. If this demonstrates DCIS, stereotactic biopsy (or excision at the time of lumpectomy ) of the second smaller group of calcifications is recommended. If this is benign, I recommend six-month follow-up mammography of the additional calcifications. The parenchymal pattern is stable bilaterally. Mammographic images were processed with CAD. IMPRESSION: 10 mm group of slightly suspicious calcifications located within the upper-outer quadrant of the right breast with a smaller (5 mm) adjacent group of similar calcifications present. Stereotactic biopsy of the larger group of calcifications is recommended and will be scheduled. RECOMMENDATION: Right breast stereotactic biopsy. I have discussed the findings and recommendations with the patient. Results were also provided in writing at the conclusion of the visit. If  applicable, a reminder letter will be sent to the patient regarding the next appointment. BI-RADS CATEGORY  4: Suspicious. Electronically Signed   By: Altamese Cabal M.D.   On: 04/13/2016 13:52   Mm Clip Placement Right  Result Date: 04/16/2016 CLINICAL DATA:  Patient status post stereotactic guided core needle biopsy right breast calcifications. EXAM: DIAGNOSTIC RIGHT MAMMOGRAM POST STEREOTACTIC BIOPSY COMPARISON:  Previous exam(s). FINDINGS: Mammographic images were obtained following stereotactic guided biopsy of right breast calcifications. Cylinder-shaped marking clip in appropriate position. IMPRESSION: Appropriate position cylinder-shaped marking clip status post stereotactic guided core needle biopsy right breast calcifications. Final Assessment: Post Procedure Mammograms for Marker Placement Electronically Signed   By: Lovey Newcomer M.D.   On: 04/16/2016 09:26   Mm Rt Breast Bx W Loc Dev 1st Lesion Image Bx Spec Stereo Guide  Addendum Date: 04/17/2016   ADDENDUM REPORT: 04/17/2016 13:11 ADDENDUM: Pathology revealed FIBROCYSTIC CHANGES WITH CALCIFICATIONS AND USUAL DUCTAL HYPERPLASIA of the Right breast, upper outer. This was found to be concordant by Dr. Lovey Newcomer. Pathology results were discussed with the patient by telephone. The patient reported doing well after the biopsy with tenderness at the site. Post biopsy instructions and care were reviewed and questions were answered. The patient was encouraged to call [facility] for any additional concerns. The patient was instructed to return for right diagnostic mammography in 6 months and informed a reminder notice would be sent regarding this appointment. Pathology results reported by Terie Purser, RN on 04/17/2016. Electronically Signed   By: Lovey Newcomer M.D.   On: 04/17/2016 13:11   Result Date: 04/17/2016 CLINICAL DATA:  Patient with indeterminate right breast calcifications. EXAM: RIGHT BREAST STEREOTACTIC  CORE NEEDLE BIOPSY COMPARISON:  Previous exams. FINDINGS: The patient and I discussed the procedure of stereotactic-guided biopsy including benefits and alternatives. We discussed the high likelihood of a successful procedure. We discussed the risks of the procedure including infection, bleeding, tissue injury, clip migration, and inadequate sampling. Informed written consent was given. The usual time out protocol was performed  immediately prior to the procedure. Using sterile technique and 1% Lidocaine as local anesthetic, under stereotactic guidance, a 9 gauge vacuum assisted core needle biopsy device was used to perform core needle biopsy of calcifications in the upper-outer right breast using a cranial approach. Specimen radiograph was performed showing calcifications. Specimens with calcifications are identified for pathology. At the conclusion of the procedure, a cylinder-shaped tissue marker clip was deployed into the biopsy cavity. Follow-up 2-view mammogram was performed and dictated separately. IMPRESSION: Stereotactic-guided biopsy of right breast calcifications. No apparent complications. Electronically Signed: By: Lovey Newcomer M.D. On: 04/16/2016 09:25    ASSESSMENT: 70 y.o. [place] woman status post left breast lower outer quadrant biopsy of 2 separate lower outer quadrant masses 01/10/2014 for a multifocal cT2 cNX, stage II invasive ductal carcinoma, grade 3, with an MIB-1 of 90%, HER-2 negative, estrogen receptors 3% positive with weak staining, progesterone receptor negative, with an MIB-1 of 90%; this is clinically a "triple-negative" breast cancer and will be treated as such  (1) a suspicious left axillary lymph node biopsied 01/10/2014 was negative (concordant)  (2) left lumpectomy and sentinel lymph node sampling 02/12/2014 showed an mpT2 pN0, stage IIA invasive ductal carcinoma grade 3, repeat HER-2 again negative  (3) adjuvant chemotherapy consisted of  (a) dose dense doxorubicin  and cyclophosphamide x4 completed 05/02/2014  (b) weekly paclitaxel and carboplatin with 3 of 12 doses given, then stopped because of neuropathy  (c) Carboplatin and Gemzar started 06/11/2014, given on day 1 & day 8 of a 21 days cycle for 3 cycles, completed 07/30/2014  (4) adjuvant radiation 08/27/2014 through 09/21/2014:  The patient initially received a dose of 42.5 Gy in 17 fractions to the left breast using whole-breast tangent fields. This was delivered using a 3-D conformal technique. The patient then received a boost to the seroma. This delivered an additional 7.5 Gy in 3 fractions using a 3 field photon technique due to the depth of the seroma. The total dose was 50 Gy.  (5) tamoxifen started (prophylactically) in December 2016; stopped after 2 weeks due to side effects.   (a) given the very weak and low estrogen receptor positivity, resumption of anti-estrogens not anticipated  (6) right breast upper outer quadrant biopsy 04/16/2016 showed no evidence of malignancy  PLAN:  Tashia is now a little over 2 years out from definitive surgery for her breast cancer with no evidence of disease recurrence. This is very favorable.  We have decided that the very small possible benefit she might derive from anti-estrogens is not worth the symptoms she was experiencing so she is being followed with observation alone   I don't have a simple explanation for her fatigue. If she is able to go to the gym 3 times a week that is reassuring. I think more exercise would mean more energy and I gave her information on the trails to recovery program.  Otherwise I am going to start seeing her on a once a year basis at this point. We will continue this pattern until she completes her 5 years of follow-up  She knows to call for any problems that may develop before her next visit here.  Chauncey Cruel, MD 04/20/2016 1:41 PM

## 2016-05-29 ENCOUNTER — Ambulatory Visit (INDEPENDENT_AMBULATORY_CARE_PROVIDER_SITE_OTHER): Payer: Medicare Other | Admitting: Physician Assistant

## 2016-05-29 ENCOUNTER — Ambulatory Visit (INDEPENDENT_AMBULATORY_CARE_PROVIDER_SITE_OTHER): Payer: Medicare Other

## 2016-05-29 VITALS — BP 163/80 | HR 71 | Temp 98.8°F | Resp 18

## 2016-05-29 DIAGNOSIS — M25551 Pain in right hip: Secondary | ICD-10-CM | POA: Diagnosis not present

## 2016-05-29 DIAGNOSIS — I10 Essential (primary) hypertension: Secondary | ICD-10-CM

## 2016-05-29 MED ORDER — KETOROLAC TROMETHAMINE 60 MG/2ML IM SOLN
60.0000 mg | Freq: Once | INTRAMUSCULAR | Status: AC
  Administered 2016-05-29: 60 mg via INTRAMUSCULAR

## 2016-05-29 MED ORDER — CYCLOBENZAPRINE HCL 5 MG PO TABS: 5.0000 mg | ORAL_TABLET | Freq: Three times a day (TID) | ORAL | 0 refills | Status: DC | PRN

## 2016-05-29 MED ORDER — HYDROCODONE-ACETAMINOPHEN 5-325 MG PO TABS: 1.0000 | ORAL_TABLET | Freq: Four times a day (QID) | ORAL | 0 refills | Status: DC | PRN

## 2016-05-29 NOTE — Patient Instructions (Addendum)
Ice and rest  Use muscle relaxers to help with the pain  Use Norco as needed you can stop your Ultram while you are taking this.  Start Colace to help prevent constipation.  IF you received an x-ray today, you will receive an invoice from [facility]. Please contact [facility] at [phone] with questions or concerns regarding your invoice.   IF you received labwork today, you will receive an invoice from [employer]. Please contact [facility] at [phone] with questions or concerns regarding your invoice.   Our billing staff will not be able to assist you with questions regarding bills from these companies.  You will be contacted with the lab results as soon as they are available. The fastest way to get your results is to activate your My Chart account. Instructions are located on the last page of this paperwork. If you have not heard from [place] regarding the results in 2 weeks, please contact this office.

## 2016-05-29 NOTE — Progress Notes (Signed)
Monica [name]  MRN: [id number] DOB: 1946/05/20  Subjective:  Pt presents to clinic with right sided hip pain.  She went to bed last night and felt fine and then this am she woke up she has small amount of pain but then it got worse as the day went on - the pain is more in her groin and then will radiate into her right thigh.  No paresthesias or weakness in her leg.  No urinary symptoms and no h/o kidney stones.  She currently has no back pain but she does have h/o back surgery lumbar region in 1991 but she is unsure of the level of the surgery.  Took her Ultram but it did not help at all with her current pain. This pain feels different than her pain that she had prior to her back surgery.  Review of Systems  Musculoskeletal: Positive for gait problem (2nd to pain). Negative for joint swelling.    Patient Active Problem List   Diagnosis Date Noted  . Hot flashes 04/05/2015  . Other pancytopenia ([place]) 06/28/2014  . Corneal abrasion 06/27/2014  . Anemia associated with chemotherapy 06/25/2014  . Thrombocytopenia ([place]) 06/25/2014  . Chemotherapy-induced neuropathy ([place]) 06/04/2014  . Decreased appetite 05/28/2014  . Body aches 05/28/2014  . Dyspepsia 05/28/2014  . Insomnia 05/02/2014  . UTI (urinary tract infection) 04/17/2014  . Febrile neutropenia ([place]) 04/15/2014  . Hypokalemia 04/15/2014  . Hypertension 04/15/2014  . Itchy eyes 04/09/2014  . Heart palpitations 04/04/2014  . Nausea without vomiting 03/26/2014  . Heartburn 03/26/2014  . Thrush 03/26/2014  . Breast cancer of lower-outer quadrant of left female breast ([place]) 01/12/2014    Current Outpatient Prescriptions on File Prior to Visit  Medication Sig Dispense Refill  . acetaminophen (TYLENOL) 500 MG tablet Take 1,000 mg by mouth every 6 (six) hours as needed for moderate pain.    [name] amLODipine (NORVASC) 5 MG tablet Take 5 mg by mouth daily.    . Cholecalciferol (VITAMIN D3) 2000 UNITS TABS Take 1 tablet by mouth daily.     [name] docusate sodium (COLACE) 100 MG capsule Take 100 mg by mouth daily as needed for mild constipation.     . gabapentin (NEURONTIN) 300 MG capsule Take 1 capsule (300 mg total) by mouth at bedtime. 90 capsule 0  . Ginkgo Biloba 120 MG CAPS Take 1 capsule by mouth daily.    . meloxicam (MOBIC) 15 MG tablet Take 15 mg by mouth daily.    . Omega-3 Fatty Acids (FISH OIL CONCENTRATE) 300 MG CAPS Take by mouth.    . traMADol (ULTRAM) 50 MG tablet Take 50 mg by mouth every 6 (six) hours as needed for moderate pain.    . valsartan-hydrochlorothiazide (DIOVAN-HCT) 320-25 MG per tablet Take 1 tablet by mouth daily.    . cloNIDine (CATAPRES-TTS-1) 0.1 mg/24hr patch Place 1 patch (0.1 mg total) onto the skin once a week. (Patient not taking: Reported on 05/29/2016) 12 patch 3   No current facility-administered medications on file prior to visit.     Allergies  Allergen Reactions  . Codeine Other (See Comments)    Heart fast    Pt patients past, family and social history were reviewed and updated.   Objective:  BP (!) 163/80 (BP Location: Right Arm, Patient Position: Sitting, Cuff Size: Large)   Pulse 71   Temp 98.8 F (37.1 C)   Resp 18   SpO2 100%   Physical Exam  Constitutional: She is oriented to  person, place, and time and well-developed, well-nourished, and in no distress.  HENT:  Head: Normocephalic and atraumatic.  Right Ear: Hearing and external ear normal.  Left Ear: Hearing and external ear normal.  Eyes: Conjunctivae are normal.  Neck: Normal range of motion.  Pulmonary/Chest: Effort normal.  Musculoskeletal:       Right hip: She exhibits decreased range of motion (2nd to pain), decreased strength (2nd to pain) and tenderness (anterior hip is TTP - no TTP over posterior hip, sacrum or tocanteric bursa).       Left hip: Normal.  Neurological: She is alert and oriented to person, place, and time. Gait normal.  Skin: Skin is warm and dry.  Psychiatric: Mood, memory, affect and  judgment normal.  Vitals reviewed.  Dg Hip Unilat W Or W/o Pelvis 2-3 Views Right  Result Date: 05/29/2016 CLINICAL DATA:  Right hip pain, no known injury EXAM: DG HIP (WITH OR WITHOUT PELVIS) 2-3V RIGHT COMPARISON:  None. FINDINGS: No fracture or dislocation is seen. The joint spaces are preserved. Visualized bony pelvis appears intact. Degenerative changes of the lower lumbar spine. IMPRESSION: No acute osseus abnormality is seen. Electronically Signed   By: Julian Hy M.D.   On: 05/29/2016 17:47    Assessment and Plan :  Right hip pain - Plan: ketorolac (TORADOL) injection 60 mg, DG HIP UNILAT W OR W/O PELVIS 2-3 VIEWS RIGHT  Essential hypertension   BP uncontrolled but the patient is in a lot of pain.  She has a normal xray so at this time we will treat pain and possible muscle spasm and she will f/u if she is not improving she will return to clinic.  D/w Dr Jonna Clark PA-C  [facility] 05/29/2016 5:55 PM

## 2016-06-22 HISTORY — PX: BREAST LUMPECTOMY: SHX2

## 2016-06-24 DIAGNOSIS — Z6839 Body mass index (BMI) 39.0-39.9, adult: Secondary | ICD-10-CM | POA: Diagnosis not present

## 2016-06-24 DIAGNOSIS — Z853 Personal history of malignant neoplasm of breast: Secondary | ICD-10-CM | POA: Diagnosis not present

## 2016-06-24 DIAGNOSIS — M13162 Monoarthritis, not elsewhere classified, left knee: Secondary | ICD-10-CM | POA: Diagnosis not present

## 2016-06-24 DIAGNOSIS — Z0000 Encounter for general adult medical examination without abnormal findings: Secondary | ICD-10-CM | POA: Diagnosis not present

## 2016-06-24 DIAGNOSIS — E669 Obesity, unspecified: Secondary | ICD-10-CM | POA: Diagnosis not present

## 2016-06-24 DIAGNOSIS — I10 Essential (primary) hypertension: Secondary | ICD-10-CM | POA: Diagnosis not present

## 2016-06-24 DIAGNOSIS — K5900 Constipation, unspecified: Secondary | ICD-10-CM | POA: Diagnosis not present

## 2016-06-24 DIAGNOSIS — Z791 Long term (current) use of non-steroidal anti-inflammatories (NSAID): Secondary | ICD-10-CM | POA: Diagnosis not present

## 2016-06-24 DIAGNOSIS — M47816 Spondylosis without myelopathy or radiculopathy, lumbar region: Secondary | ICD-10-CM | POA: Diagnosis not present

## 2016-06-24 DIAGNOSIS — N951 Menopausal and female climacteric states: Secondary | ICD-10-CM | POA: Diagnosis not present

## 2016-06-24 DIAGNOSIS — M13161 Monoarthritis, not elsewhere classified, right knee: Secondary | ICD-10-CM | POA: Diagnosis not present

## 2016-07-02 DIAGNOSIS — R69 Illness, unspecified: Secondary | ICD-10-CM | POA: Diagnosis not present

## 2016-07-02 DIAGNOSIS — M5416 Radiculopathy, lumbar region: Secondary | ICD-10-CM | POA: Diagnosis not present

## 2016-07-02 DIAGNOSIS — Z79899 Other long term (current) drug therapy: Secondary | ICD-10-CM | POA: Diagnosis not present

## 2016-07-02 DIAGNOSIS — M5126 Other intervertebral disc displacement, lumbar region: Secondary | ICD-10-CM | POA: Diagnosis not present

## 2016-07-07 DIAGNOSIS — H04123 Dry eye syndrome of bilateral lacrimal glands: Secondary | ICD-10-CM | POA: Diagnosis not present

## 2016-07-07 DIAGNOSIS — H33103 Unspecified retinoschisis, bilateral: Secondary | ICD-10-CM | POA: Diagnosis not present

## 2016-07-07 DIAGNOSIS — H25013 Cortical age-related cataract, bilateral: Secondary | ICD-10-CM | POA: Diagnosis not present

## 2016-07-07 DIAGNOSIS — H2513 Age-related nuclear cataract, bilateral: Secondary | ICD-10-CM | POA: Diagnosis not present

## 2016-07-15 DIAGNOSIS — Z6835 Body mass index (BMI) 35.0-35.9, adult: Secondary | ICD-10-CM | POA: Diagnosis not present

## 2016-07-15 DIAGNOSIS — M5126 Other intervertebral disc displacement, lumbar region: Secondary | ICD-10-CM | POA: Diagnosis not present

## 2016-07-15 DIAGNOSIS — M5416 Radiculopathy, lumbar region: Secondary | ICD-10-CM | POA: Diagnosis not present

## 2016-07-15 DIAGNOSIS — I10 Essential (primary) hypertension: Secondary | ICD-10-CM | POA: Diagnosis not present

## 2016-07-17 ENCOUNTER — Other Ambulatory Visit: Payer: Self-pay | Admitting: Oncology

## 2016-07-17 DIAGNOSIS — Z170 Estrogen receptor positive status [ER+]: Principal | ICD-10-CM

## 2016-07-17 DIAGNOSIS — C50512 Malignant neoplasm of lower-outer quadrant of left female breast: Secondary | ICD-10-CM

## 2016-07-28 DIAGNOSIS — M170 Bilateral primary osteoarthritis of knee: Secondary | ICD-10-CM | POA: Diagnosis not present

## 2016-07-28 DIAGNOSIS — M25561 Pain in right knee: Secondary | ICD-10-CM | POA: Diagnosis not present

## 2016-07-28 DIAGNOSIS — M25562 Pain in left knee: Secondary | ICD-10-CM | POA: Diagnosis not present

## 2016-07-28 DIAGNOSIS — R262 Difficulty in walking, not elsewhere classified: Secondary | ICD-10-CM | POA: Diagnosis not present

## 2016-08-05 DIAGNOSIS — M25561 Pain in right knee: Secondary | ICD-10-CM | POA: Diagnosis not present

## 2016-08-05 DIAGNOSIS — M1711 Unilateral primary osteoarthritis, right knee: Secondary | ICD-10-CM | POA: Diagnosis not present

## 2016-08-05 DIAGNOSIS — R262 Difficulty in walking, not elsewhere classified: Secondary | ICD-10-CM | POA: Diagnosis not present

## 2016-08-12 DIAGNOSIS — M1711 Unilateral primary osteoarthritis, right knee: Secondary | ICD-10-CM | POA: Diagnosis not present

## 2016-08-12 DIAGNOSIS — M25561 Pain in right knee: Secondary | ICD-10-CM | POA: Diagnosis not present

## 2016-08-19 DIAGNOSIS — M25561 Pain in right knee: Secondary | ICD-10-CM | POA: Diagnosis not present

## 2016-08-19 DIAGNOSIS — M1711 Unilateral primary osteoarthritis, right knee: Secondary | ICD-10-CM | POA: Diagnosis not present

## 2016-08-26 DIAGNOSIS — M25561 Pain in right knee: Secondary | ICD-10-CM | POA: Diagnosis not present

## 2016-08-26 DIAGNOSIS — M1711 Unilateral primary osteoarthritis, right knee: Secondary | ICD-10-CM | POA: Diagnosis not present

## 2016-09-01 DIAGNOSIS — M25562 Pain in left knee: Secondary | ICD-10-CM | POA: Diagnosis not present

## 2016-09-01 DIAGNOSIS — M1712 Unilateral primary osteoarthritis, left knee: Secondary | ICD-10-CM | POA: Diagnosis not present

## 2016-09-08 ENCOUNTER — Other Ambulatory Visit: Payer: Self-pay | Admitting: Family Medicine

## 2016-09-08 ENCOUNTER — Other Ambulatory Visit (HOSPITAL_COMMUNITY)
Admission: RE | Admit: 2016-09-08 | Discharge: 2016-09-08 | Disposition: A | Discharge status: Home / Self Care | Payer: Medicare HMO | Source: Ambulatory Visit | Attending: Family Medicine | Admitting: Family Medicine

## 2016-09-08 DIAGNOSIS — Z124 Encounter for screening for malignant neoplasm of cervix: Secondary | ICD-10-CM | POA: Diagnosis not present

## 2016-09-08 DIAGNOSIS — I10 Essential (primary) hypertension: Secondary | ICD-10-CM | POA: Diagnosis not present

## 2016-09-08 DIAGNOSIS — J069 Acute upper respiratory infection, unspecified: Secondary | ICD-10-CM | POA: Diagnosis not present

## 2016-09-08 DIAGNOSIS — Z853 Personal history of malignant neoplasm of breast: Secondary | ICD-10-CM | POA: Diagnosis not present

## 2016-09-08 DIAGNOSIS — L089 Local infection of the skin and subcutaneous tissue, unspecified: Secondary | ICD-10-CM | POA: Diagnosis not present

## 2016-09-08 DIAGNOSIS — Z6838 Body mass index (BMI) 38.0-38.9, adult: Secondary | ICD-10-CM | POA: Diagnosis not present

## 2016-09-08 DIAGNOSIS — Z0000 Encounter for general adult medical examination without abnormal findings: Secondary | ICD-10-CM | POA: Diagnosis not present

## 2016-09-08 DIAGNOSIS — M5126 Other intervertebral disc displacement, lumbar region: Secondary | ICD-10-CM | POA: Diagnosis not present

## 2016-09-08 DIAGNOSIS — E2839 Other primary ovarian failure: Secondary | ICD-10-CM

## 2016-09-08 DIAGNOSIS — N390 Urinary tract infection, site not specified: Secondary | ICD-10-CM | POA: Diagnosis not present

## 2016-09-09 DIAGNOSIS — M25562 Pain in left knee: Secondary | ICD-10-CM | POA: Diagnosis not present

## 2016-09-09 DIAGNOSIS — M1712 Unilateral primary osteoarthritis, left knee: Secondary | ICD-10-CM | POA: Diagnosis not present

## 2016-09-10 LAB — CYTOLOGY - PAP: Diagnosis: NEGATIVE

## 2016-09-16 DIAGNOSIS — M1712 Unilateral primary osteoarthritis, left knee: Secondary | ICD-10-CM | POA: Diagnosis not present

## 2016-09-16 DIAGNOSIS — M25562 Pain in left knee: Secondary | ICD-10-CM | POA: Diagnosis not present

## 2016-09-18 ENCOUNTER — Ambulatory Visit
Admission: RE | Admit: 2016-09-18 | Discharge: 2016-09-18 | Disposition: A | Discharge status: Home / Self Care | Payer: Medicare HMO | Source: Ambulatory Visit | Attending: Family Medicine | Admitting: Family Medicine

## 2016-09-18 DIAGNOSIS — Z13820 Encounter for screening for osteoporosis: Secondary | ICD-10-CM | POA: Diagnosis not present

## 2016-09-18 DIAGNOSIS — E2839 Other primary ovarian failure: Secondary | ICD-10-CM

## 2016-09-18 DIAGNOSIS — Z780 Asymptomatic menopausal state: Secondary | ICD-10-CM | POA: Diagnosis not present

## 2016-09-23 DIAGNOSIS — M1712 Unilateral primary osteoarthritis, left knee: Secondary | ICD-10-CM | POA: Diagnosis not present

## 2016-09-23 DIAGNOSIS — M25562 Pain in left knee: Secondary | ICD-10-CM | POA: Diagnosis not present

## 2016-09-29 DIAGNOSIS — E876 Hypokalemia: Secondary | ICD-10-CM | POA: Diagnosis not present

## 2016-10-05 DIAGNOSIS — Z6835 Body mass index (BMI) 35.0-35.9, adult: Secondary | ICD-10-CM | POA: Diagnosis not present

## 2016-10-05 DIAGNOSIS — Z79899 Other long term (current) drug therapy: Secondary | ICD-10-CM | POA: Diagnosis not present

## 2016-10-05 DIAGNOSIS — M5126 Other intervertebral disc displacement, lumbar region: Secondary | ICD-10-CM | POA: Diagnosis not present

## 2016-10-05 DIAGNOSIS — M47812 Spondylosis without myelopathy or radiculopathy, cervical region: Secondary | ICD-10-CM | POA: Diagnosis not present

## 2016-10-05 DIAGNOSIS — R69 Illness, unspecified: Secondary | ICD-10-CM | POA: Diagnosis not present

## 2016-10-05 DIAGNOSIS — M5416 Radiculopathy, lumbar region: Secondary | ICD-10-CM | POA: Diagnosis not present

## 2016-10-07 DIAGNOSIS — M5416 Radiculopathy, lumbar region: Secondary | ICD-10-CM | POA: Diagnosis not present

## 2016-10-07 DIAGNOSIS — M5126 Other intervertebral disc displacement, lumbar region: Secondary | ICD-10-CM | POA: Diagnosis not present

## 2016-10-23 DIAGNOSIS — H33193 Other retinoschisis and retinal cysts, bilateral: Secondary | ICD-10-CM | POA: Diagnosis not present

## 2016-10-23 DIAGNOSIS — H31091 Other chorioretinal scars, right eye: Secondary | ICD-10-CM | POA: Diagnosis not present

## 2016-10-23 DIAGNOSIS — C50912 Malignant neoplasm of unspecified site of left female breast: Secondary | ICD-10-CM | POA: Diagnosis not present

## 2016-11-11 DIAGNOSIS — M21612 Bunion of left foot: Secondary | ICD-10-CM | POA: Diagnosis not present

## 2016-11-11 DIAGNOSIS — M79675 Pain in left toe(s): Secondary | ICD-10-CM | POA: Diagnosis not present

## 2016-12-14 DIAGNOSIS — M5416 Radiculopathy, lumbar region: Secondary | ICD-10-CM | POA: Diagnosis not present

## 2016-12-14 DIAGNOSIS — M47812 Spondylosis without myelopathy or radiculopathy, cervical region: Secondary | ICD-10-CM | POA: Diagnosis not present

## 2016-12-14 DIAGNOSIS — M5126 Other intervertebral disc displacement, lumbar region: Secondary | ICD-10-CM | POA: Diagnosis not present

## 2016-12-14 DIAGNOSIS — R69 Illness, unspecified: Secondary | ICD-10-CM | POA: Diagnosis not present

## 2017-01-07 DIAGNOSIS — M461 Sacroiliitis, not elsewhere classified: Secondary | ICD-10-CM | POA: Diagnosis not present

## 2017-01-07 DIAGNOSIS — M4317 Spondylolisthesis, lumbosacral region: Secondary | ICD-10-CM | POA: Diagnosis not present

## 2017-01-07 DIAGNOSIS — R69 Illness, unspecified: Secondary | ICD-10-CM | POA: Diagnosis not present

## 2017-01-07 DIAGNOSIS — M7061 Trochanteric bursitis, right hip: Secondary | ICD-10-CM | POA: Diagnosis not present

## 2017-01-07 DIAGNOSIS — Z79899 Other long term (current) drug therapy: Secondary | ICD-10-CM | POA: Diagnosis not present

## 2017-01-20 DIAGNOSIS — M5126 Other intervertebral disc displacement, lumbar region: Secondary | ICD-10-CM | POA: Diagnosis not present

## 2017-01-20 DIAGNOSIS — M5416 Radiculopathy, lumbar region: Secondary | ICD-10-CM | POA: Diagnosis not present

## 2017-01-29 ENCOUNTER — Other Ambulatory Visit: Payer: Self-pay | Admitting: Oncology

## 2017-01-29 DIAGNOSIS — C50512 Malignant neoplasm of lower-outer quadrant of left female breast: Secondary | ICD-10-CM

## 2017-01-29 DIAGNOSIS — Z170 Estrogen receptor positive status [ER+]: Principal | ICD-10-CM

## 2017-02-01 ENCOUNTER — Other Ambulatory Visit: Payer: Self-pay | Admitting: Oncology

## 2017-02-01 DIAGNOSIS — R921 Mammographic calcification found on diagnostic imaging of breast: Secondary | ICD-10-CM

## 2017-02-09 ENCOUNTER — Other Ambulatory Visit: Payer: Self-pay

## 2017-02-09 DIAGNOSIS — C50512 Malignant neoplasm of lower-outer quadrant of left female breast: Secondary | ICD-10-CM

## 2017-02-09 DIAGNOSIS — Z170 Estrogen receptor positive status [ER+]: Principal | ICD-10-CM

## 2017-02-09 MED ORDER — GABAPENTIN 300 MG PO CAPS
300.0000 mg | ORAL_CAPSULE | Freq: Every day | ORAL | 0 refills | Status: AC
Start: 2017-02-09 — End: ?

## 2017-02-16 ENCOUNTER — Other Ambulatory Visit: Payer: Self-pay | Admitting: Oncology

## 2017-02-16 ENCOUNTER — Ambulatory Visit
Admission: RE | Admit: 2017-02-16 | Discharge: 2017-02-16 | Disposition: A | Discharge status: Home / Self Care | Payer: Medicare HMO | Source: Ambulatory Visit | Attending: Oncology | Admitting: Oncology

## 2017-02-16 DIAGNOSIS — R921 Mammographic calcification found on diagnostic imaging of breast: Secondary | ICD-10-CM

## 2017-02-16 DIAGNOSIS — N6310 Unspecified lump in the right breast, unspecified quadrant: Secondary | ICD-10-CM

## 2017-02-16 HISTORY — DX: Personal history of irradiation: Z92.3

## 2017-02-16 HISTORY — DX: Personal history of antineoplastic chemotherapy: Z92.21

## 2017-02-17 ENCOUNTER — Ambulatory Visit
Admission: RE | Admit: 2017-02-17 | Discharge: 2017-02-17 | Disposition: A | Discharge status: Home / Self Care | Payer: Medicare HMO | Source: Ambulatory Visit | Attending: Oncology | Admitting: Oncology

## 2017-02-17 DIAGNOSIS — N6310 Unspecified lump in the right breast, unspecified quadrant: Secondary | ICD-10-CM

## 2017-02-17 DIAGNOSIS — N6312 Unspecified lump in the right breast, upper inner quadrant: Secondary | ICD-10-CM | POA: Diagnosis not present

## 2017-02-17 DIAGNOSIS — D0511 Intraductal carcinoma in situ of right breast: Secondary | ICD-10-CM | POA: Diagnosis not present

## 2017-02-24 ENCOUNTER — Encounter: Payer: Self-pay | Admitting: Adult Health

## 2017-02-24 DIAGNOSIS — Z170 Estrogen receptor positive status [ER+]: Secondary | ICD-10-CM | POA: Insufficient documentation

## 2017-02-24 DIAGNOSIS — C50211 Malignant neoplasm of upper-inner quadrant of right female breast: Secondary | ICD-10-CM | POA: Insufficient documentation

## 2017-02-25 DIAGNOSIS — M170 Bilateral primary osteoarthritis of knee: Secondary | ICD-10-CM | POA: Diagnosis not present

## 2017-02-25 DIAGNOSIS — M1711 Unilateral primary osteoarthritis, right knee: Secondary | ICD-10-CM | POA: Diagnosis not present

## 2017-02-25 DIAGNOSIS — D0511 Intraductal carcinoma in situ of right breast: Secondary | ICD-10-CM | POA: Diagnosis not present

## 2017-02-25 DIAGNOSIS — M1712 Unilateral primary osteoarthritis, left knee: Secondary | ICD-10-CM | POA: Diagnosis not present

## 2017-02-25 DIAGNOSIS — M25562 Pain in left knee: Secondary | ICD-10-CM | POA: Diagnosis not present

## 2017-02-25 DIAGNOSIS — G8929 Other chronic pain: Secondary | ICD-10-CM | POA: Diagnosis not present

## 2017-02-25 DIAGNOSIS — M25561 Pain in right knee: Secondary | ICD-10-CM | POA: Diagnosis not present

## 2017-02-26 ENCOUNTER — Ambulatory Visit: Payer: Self-pay | Admitting: General Surgery

## 2017-02-26 DIAGNOSIS — D0511 Intraductal carcinoma in situ of right breast: Secondary | ICD-10-CM

## 2017-03-03 ENCOUNTER — Ambulatory Visit
Admission: RE | Admit: 2017-03-03 | Discharge: 2017-03-03 | Disposition: A | Discharge status: Home / Self Care | Payer: Medicare HMO | Source: Ambulatory Visit | Attending: General Surgery | Admitting: General Surgery

## 2017-03-03 ENCOUNTER — Other Ambulatory Visit: Payer: Self-pay | Admitting: General Surgery

## 2017-03-03 DIAGNOSIS — R0781 Pleurodynia: Secondary | ICD-10-CM

## 2017-03-03 DIAGNOSIS — I700 Atherosclerosis of aorta: Secondary | ICD-10-CM | POA: Diagnosis not present

## 2017-03-04 DIAGNOSIS — R5383 Other fatigue: Secondary | ICD-10-CM | POA: Diagnosis not present

## 2017-03-04 DIAGNOSIS — C50919 Malignant neoplasm of unspecified site of unspecified female breast: Secondary | ICD-10-CM | POA: Diagnosis not present

## 2017-03-04 DIAGNOSIS — R350 Frequency of micturition: Secondary | ICD-10-CM | POA: Diagnosis not present

## 2017-03-04 DIAGNOSIS — Z23 Encounter for immunization: Secondary | ICD-10-CM | POA: Diagnosis not present

## 2017-03-04 DIAGNOSIS — E039 Hypothyroidism, unspecified: Secondary | ICD-10-CM | POA: Diagnosis not present

## 2017-03-05 ENCOUNTER — Other Ambulatory Visit: Payer: Self-pay | Admitting: General Surgery

## 2017-03-05 DIAGNOSIS — D0511 Intraductal carcinoma in situ of right breast: Secondary | ICD-10-CM

## 2017-03-08 ENCOUNTER — Encounter (HOSPITAL_BASED_OUTPATIENT_CLINIC_OR_DEPARTMENT_OTHER)
Admission: RE | Admit: 2017-03-08 | Discharge: 2017-03-08 | Disposition: A | Discharge status: Home / Self Care | Payer: Medicare HMO | Source: Ambulatory Visit | Attending: General Surgery | Admitting: General Surgery

## 2017-03-08 ENCOUNTER — Encounter (HOSPITAL_BASED_OUTPATIENT_CLINIC_OR_DEPARTMENT_OTHER): Payer: Self-pay | Admitting: *Deleted

## 2017-03-08 DIAGNOSIS — Z01818 Encounter for other preprocedural examination: Secondary | ICD-10-CM | POA: Insufficient documentation

## 2017-03-08 NOTE — Progress Notes (Signed)
Ensure pre surgery drink given with instructions to complete by 0845 dos, pt verbalized understanding. 

## 2017-03-16 ENCOUNTER — Other Ambulatory Visit: Payer: Self-pay | Admitting: General Surgery

## 2017-03-16 DIAGNOSIS — Z853 Personal history of malignant neoplasm of breast: Secondary | ICD-10-CM

## 2017-03-16 DIAGNOSIS — R921 Mammographic calcification found on diagnostic imaging of breast: Secondary | ICD-10-CM

## 2017-03-23 ENCOUNTER — Ambulatory Visit
Admission: RE | Admit: 2017-03-23 | Discharge: 2017-03-23 | Disposition: A | Discharge status: Home / Self Care | Payer: Medicare HMO | Source: Ambulatory Visit | Attending: General Surgery | Admitting: General Surgery

## 2017-03-23 DIAGNOSIS — R921 Mammographic calcification found on diagnostic imaging of breast: Secondary | ICD-10-CM

## 2017-03-23 DIAGNOSIS — D241 Benign neoplasm of right breast: Secondary | ICD-10-CM | POA: Diagnosis not present

## 2017-03-23 DIAGNOSIS — R928 Other abnormal and inconclusive findings on diagnostic imaging of breast: Secondary | ICD-10-CM | POA: Diagnosis not present

## 2017-03-23 DIAGNOSIS — Z853 Personal history of malignant neoplasm of breast: Secondary | ICD-10-CM

## 2017-03-26 ENCOUNTER — Encounter (HOSPITAL_BASED_OUTPATIENT_CLINIC_OR_DEPARTMENT_OTHER): Payer: Self-pay | Admitting: *Deleted

## 2017-03-29 ENCOUNTER — Encounter (HOSPITAL_BASED_OUTPATIENT_CLINIC_OR_DEPARTMENT_OTHER)
Admission: RE | Admit: 2017-03-29 | Discharge: 2017-03-29 | Disposition: A | Discharge status: Home / Self Care | Payer: Medicare HMO | Source: Ambulatory Visit | Attending: General Surgery | Admitting: General Surgery

## 2017-03-29 DIAGNOSIS — Z01818 Encounter for other preprocedural examination: Secondary | ICD-10-CM | POA: Diagnosis not present

## 2017-03-29 DIAGNOSIS — Z01812 Encounter for preprocedural laboratory examination: Secondary | ICD-10-CM | POA: Diagnosis present

## 2017-03-29 LAB — BASIC METABOLIC PANEL
ANION GAP: 6 (ref 5–15)
BUN: 21 mg/dL — ABNORMAL HIGH (ref 6–20)
CALCIUM: 9.5 mg/dL (ref 8.9–10.3)
CHLORIDE: 103 mmol/L (ref 101–111)
CO2: 29 mmol/L (ref 22–32)
Creatinine, Ser: 1.01 mg/dL — ABNORMAL HIGH (ref 0.44–1.00)
GFR calc non Af Amer: 55 mL/min — ABNORMAL LOW (ref >60)
GLUCOSE: 88 mg/dL (ref 65–99)
Potassium: 4.4 mmol/L (ref 3.5–5.1)
Sodium: 138 mmol/L (ref 135–145)

## 2017-03-29 NOTE — Progress Notes (Signed)
Pt given Ensure at her request, instructed to drink by 0400 day of surgery with teach back method.

## 2017-04-01 ENCOUNTER — Ambulatory Visit
Admission: RE | Admit: 2017-04-01 | Discharge: 2017-04-01 | Disposition: A | Discharge status: Home / Self Care | Payer: Medicare HMO | Source: Ambulatory Visit | Attending: General Surgery | Admitting: General Surgery

## 2017-04-01 DIAGNOSIS — D0511 Intraductal carcinoma in situ of right breast: Secondary | ICD-10-CM

## 2017-04-01 DIAGNOSIS — D241 Benign neoplasm of right breast: Secondary | ICD-10-CM | POA: Diagnosis not present

## 2017-04-02 ENCOUNTER — Ambulatory Visit (HOSPITAL_BASED_OUTPATIENT_CLINIC_OR_DEPARTMENT_OTHER): Payer: Medicare HMO | Admitting: Certified Registered"

## 2017-04-02 ENCOUNTER — Encounter (HOSPITAL_BASED_OUTPATIENT_CLINIC_OR_DEPARTMENT_OTHER): Payer: Self-pay | Admitting: Emergency Medicine

## 2017-04-02 ENCOUNTER — Ambulatory Visit (HOSPITAL_BASED_OUTPATIENT_CLINIC_OR_DEPARTMENT_OTHER)
Admission: RE | Admit: 2017-04-02 | Discharge: 2017-04-02 | Disposition: A | Discharge status: Home / Self Care | Payer: Medicare HMO | Source: Ambulatory Visit | Attending: General Surgery | Admitting: General Surgery

## 2017-04-02 ENCOUNTER — Encounter (HOSPITAL_BASED_OUTPATIENT_CLINIC_OR_DEPARTMENT_OTHER)
Admission: RE | Disposition: A | Discharge status: Home / Self Care | Payer: Self-pay | Source: Ambulatory Visit | Attending: General Surgery

## 2017-04-02 ENCOUNTER — Ambulatory Visit
Admission: RE | Admit: 2017-04-02 | Discharge: 2017-04-02 | Disposition: A | Discharge status: Home / Self Care | Payer: Medicare HMO | Source: Ambulatory Visit | Attending: General Surgery | Admitting: General Surgery

## 2017-04-02 DIAGNOSIS — R928 Other abnormal and inconclusive findings on diagnostic imaging of breast: Secondary | ICD-10-CM | POA: Diagnosis not present

## 2017-04-02 DIAGNOSIS — Z79891 Long term (current) use of opiate analgesic: Secondary | ICD-10-CM | POA: Insufficient documentation

## 2017-04-02 DIAGNOSIS — Z923 Personal history of irradiation: Secondary | ICD-10-CM | POA: Insufficient documentation

## 2017-04-02 DIAGNOSIS — Z9221 Personal history of antineoplastic chemotherapy: Secondary | ICD-10-CM | POA: Diagnosis not present

## 2017-04-02 DIAGNOSIS — D696 Thrombocytopenia, unspecified: Secondary | ICD-10-CM | POA: Diagnosis not present

## 2017-04-02 DIAGNOSIS — Z79899 Other long term (current) drug therapy: Secondary | ICD-10-CM | POA: Insufficient documentation

## 2017-04-02 DIAGNOSIS — R0781 Pleurodynia: Secondary | ICD-10-CM | POA: Insufficient documentation

## 2017-04-02 DIAGNOSIS — D0511 Intraductal carcinoma in situ of right breast: Secondary | ICD-10-CM | POA: Insufficient documentation

## 2017-04-02 DIAGNOSIS — I10 Essential (primary) hypertension: Secondary | ICD-10-CM | POA: Insufficient documentation

## 2017-04-02 DIAGNOSIS — Z791 Long term (current) use of non-steroidal anti-inflammatories (NSAID): Secondary | ICD-10-CM | POA: Insufficient documentation

## 2017-04-02 DIAGNOSIS — N390 Urinary tract infection, site not specified: Secondary | ICD-10-CM | POA: Diagnosis not present

## 2017-04-02 HISTORY — DX: Thyrotoxicosis, unspecified without thyrotoxic crisis or storm: E05.90

## 2017-04-02 HISTORY — DX: Other specified postprocedural states: R11.2

## 2017-04-02 HISTORY — PX: BREAST LUMPECTOMY WITH RADIOACTIVE SEED LOCALIZATION: SHX6424

## 2017-04-02 HISTORY — DX: Other specified postprocedural states: Z98.890

## 2017-04-02 SURGERY — BREAST LUMPECTOMY WITH RADIOACTIVE SEED LOCALIZATION
Anesthesia: General | Site: Breast | Laterality: Right

## 2017-04-02 MED ORDER — ACETAMINOPHEN 500 MG PO TABS
ORAL_TABLET | ORAL | Status: AC
  Filled 2017-04-02: qty 2

## 2017-04-02 MED ORDER — GABAPENTIN 300 MG PO CAPS
300.0000 mg | ORAL_CAPSULE | ORAL | Status: AC
  Administered 2017-04-02: 300 mg via ORAL

## 2017-04-02 MED ORDER — LIDOCAINE 2% (20 MG/ML) 5 ML SYRINGE
INTRAMUSCULAR | Status: AC
Start: 2017-04-02 — End: ?
  Filled 2017-04-02: qty 5

## 2017-04-02 MED ORDER — LACTATED RINGERS IV SOLN: INTRAVENOUS | Status: DC

## 2017-04-02 MED ORDER — ACETAMINOPHEN 500 MG PO TABS
1000.0000 mg | ORAL_TABLET | ORAL | Status: AC
  Administered 2017-04-02: 1000 mg via ORAL

## 2017-04-02 MED ORDER — FENTANYL CITRATE (PF) 100 MCG/2ML IJ SOLN
INTRAMUSCULAR | Status: AC
  Filled 2017-04-02: qty 2

## 2017-04-02 MED ORDER — CHLORHEXIDINE GLUCONATE CLOTH 2 % EX PADS: 6.0000 | MEDICATED_PAD | Freq: Once | CUTANEOUS | Status: DC

## 2017-04-02 MED ORDER — LIDOCAINE 2% (20 MG/ML) 5 ML SYRINGE
INTRAMUSCULAR | Status: DC | PRN
  Administered 2017-04-02: 80 mg via INTRAVENOUS

## 2017-04-02 MED ORDER — TRAMADOL HCL 50 MG PO TABS: 50.0000 mg | ORAL_TABLET | Freq: Four times a day (QID) | ORAL | 1 refills | Status: DC | PRN

## 2017-04-02 MED ORDER — DEXAMETHASONE SODIUM PHOSPHATE 4 MG/ML IJ SOLN
INTRAMUSCULAR | Status: DC | PRN
  Administered 2017-04-02: 10 mg via INTRAVENOUS

## 2017-04-02 MED ORDER — MEPERIDINE HCL 25 MG/ML IJ SOLN: 6.2500 mg | INTRAMUSCULAR | Status: DC | PRN

## 2017-04-02 MED ORDER — CELECOXIB 200 MG PO CAPS
ORAL_CAPSULE | ORAL | Status: AC
  Filled 2017-04-02: qty 2

## 2017-04-02 MED ORDER — MIDAZOLAM HCL 2 MG/2ML IJ SOLN
INTRAMUSCULAR | Status: AC
Start: 2017-04-02 — End: ?
  Filled 2017-04-02: qty 2

## 2017-04-02 MED ORDER — FENTANYL CITRATE (PF) 100 MCG/2ML IJ SOLN
50.0000 ug | INTRAMUSCULAR | Status: AC | PRN
  Administered 2017-04-02 (×4): 25 ug via INTRAVENOUS

## 2017-04-02 MED ORDER — GABAPENTIN 300 MG PO CAPS
ORAL_CAPSULE | ORAL | Status: AC
  Filled 2017-04-02: qty 1

## 2017-04-02 MED ORDER — SCOPOLAMINE 1 MG/3DAYS TD PT72: 1.0000 | MEDICATED_PATCH | Freq: Once | TRANSDERMAL | Status: DC | PRN

## 2017-04-02 MED ORDER — PROPOFOL 10 MG/ML IV BOLUS
INTRAVENOUS | Status: AC
  Filled 2017-04-02: qty 40

## 2017-04-02 MED ORDER — EPHEDRINE SULFATE 50 MG/ML IJ SOLN
INTRAMUSCULAR | Status: DC | PRN
  Administered 2017-04-02: 10 mg via INTRAVENOUS

## 2017-04-02 MED ORDER — CELECOXIB 400 MG PO CAPS
400.0000 mg | ORAL_CAPSULE | ORAL | Status: AC
  Administered 2017-04-02: 400 mg via ORAL

## 2017-04-02 MED ORDER — CEFAZOLIN SODIUM-DEXTROSE 2-4 GM/100ML-% IV SOLN
INTRAVENOUS | Status: AC
  Filled 2017-04-02: qty 100

## 2017-04-02 MED ORDER — DEXAMETHASONE SODIUM PHOSPHATE 10 MG/ML IJ SOLN
INTRAMUSCULAR | Status: AC
Start: 2017-04-02 — End: ?
  Filled 2017-04-02: qty 1

## 2017-04-02 MED ORDER — LACTATED RINGERS IV SOLN
INTRAVENOUS | Status: DC
  Administered 2017-04-02: 07:00:00 via INTRAVENOUS

## 2017-04-02 MED ORDER — CEFAZOLIN SODIUM-DEXTROSE 2-4 GM/100ML-% IV SOLN
2.0000 g | INTRAVENOUS | Status: AC
  Administered 2017-04-02: 2 g via INTRAVENOUS

## 2017-04-02 MED ORDER — FENTANYL CITRATE (PF) 100 MCG/2ML IJ SOLN: 25.0000 ug | INTRAMUSCULAR | Status: DC | PRN

## 2017-04-02 MED ORDER — MIDAZOLAM HCL 2 MG/2ML IJ SOLN
1.0000 mg | INTRAMUSCULAR | Status: DC | PRN
  Administered 2017-04-02: 2 mg via INTRAVENOUS

## 2017-04-02 MED ORDER — GLYCOPYRROLATE 0.2 MG/ML IJ SOLN
INTRAMUSCULAR | Status: DC | PRN
Start: 2017-04-02 — End: 2017-04-02
  Administered 2017-04-02: 0.2 mg via INTRAVENOUS

## 2017-04-02 MED ORDER — BUPIVACAINE-EPINEPHRINE (PF) 0.25% -1:200000 IJ SOLN
INTRAMUSCULAR | Status: AC
  Filled 2017-04-02: qty 30

## 2017-04-02 MED ORDER — ONDANSETRON HCL 4 MG/2ML IJ SOLN
INTRAMUSCULAR | Status: DC | PRN
  Administered 2017-04-02: 4 mg via INTRAVENOUS

## 2017-04-02 MED ORDER — ONDANSETRON HCL 4 MG/2ML IJ SOLN
INTRAMUSCULAR | Status: AC
  Filled 2017-04-02: qty 2

## 2017-04-02 MED ORDER — METOCLOPRAMIDE HCL 5 MG/ML IJ SOLN: 10.0000 mg | Freq: Once | INTRAMUSCULAR | Status: DC | PRN

## 2017-04-02 MED ORDER — BUPIVACAINE-EPINEPHRINE (PF) 0.25% -1:200000 IJ SOLN
INTRAMUSCULAR | Status: DC | PRN
  Administered 2017-04-02: 20 mL

## 2017-04-02 MED ORDER — PROPOFOL 10 MG/ML IV BOLUS
INTRAVENOUS | Status: DC | PRN
  Administered 2017-04-02: 200 mg via INTRAVENOUS

## 2017-04-02 SURGICAL SUPPLY — 48 items
BINDER BREAST LRG (GAUZE/BANDAGES/DRESSINGS) IMPLANT
BINDER BREAST MEDIUM (GAUZE/BANDAGES/DRESSINGS) IMPLANT
BINDER BREAST XLRG (GAUZE/BANDAGES/DRESSINGS) IMPLANT
BINDER BREAST XXLRG (GAUZE/BANDAGES/DRESSINGS) IMPLANT
BLADE SURG 15 STRL LF DISP TIS (BLADE) ×1 IMPLANT
BLADE SURG 15 STRL SS (BLADE) ×1
CANISTER SUC SOCK COL 7IN (MISCELLANEOUS) IMPLANT
CANISTER SUCT 1200ML W/VALVE (MISCELLANEOUS) IMPLANT
CHLORAPREP W/TINT 26ML (MISCELLANEOUS) ×2 IMPLANT
CLIP VESOCCLUDE SM WIDE 6/CT (CLIP) IMPLANT
COVER BACK TABLE 60X90IN (DRAPES) ×2 IMPLANT
COVER MAYO STAND STRL (DRAPES) ×2 IMPLANT
COVER PROBE W GEL 5X96 (DRAPES) ×2 IMPLANT
DECANTER SPIKE VIAL GLASS SM (MISCELLANEOUS) IMPLANT
DERMABOND ADVANCED (GAUZE/BANDAGES/DRESSINGS) ×1
DERMABOND ADVANCED .7 DNX12 (GAUZE/BANDAGES/DRESSINGS) ×1 IMPLANT
DEVICE DUBIN W/COMP PLATE 8390 (MISCELLANEOUS) ×2 IMPLANT
DRAPE LAPAROSCOPIC ABDOMINAL (DRAPES) ×2 IMPLANT
DRAPE UTILITY XL STRL (DRAPES) ×2 IMPLANT
ELECT COATED BLADE 2.86 ST (ELECTRODE) ×2 IMPLANT
ELECT REM PT RETURN 9FT ADLT (ELECTROSURGICAL) ×2
ELECTRODE REM PT RTRN 9FT ADLT (ELECTROSURGICAL) ×1 IMPLANT
GLOVE BIO SURGEON STRL SZ 6.5 (GLOVE) ×2 IMPLANT
GLOVE BIOGEL PI IND STRL 7.0 (GLOVE) ×2 IMPLANT
GLOVE BIOGEL PI IND STRL 8 (GLOVE) ×1 IMPLANT
GLOVE BIOGEL PI INDICATOR 7.0 (GLOVE) ×2
GLOVE BIOGEL PI INDICATOR 8 (GLOVE) ×1
GLOVE ECLIPSE 7.5 STRL STRAW (GLOVE) ×2 IMPLANT
GOWN STRL REUS W/ TWL LRG LVL3 (GOWN DISPOSABLE) ×1 IMPLANT
GOWN STRL REUS W/ TWL XL LVL3 (GOWN DISPOSABLE) ×1 IMPLANT
GOWN STRL REUS W/TWL LRG LVL3 (GOWN DISPOSABLE) ×1
GOWN STRL REUS W/TWL XL LVL3 (GOWN DISPOSABLE) ×1
ILLUMINATOR WAVEGUIDE N/F (MISCELLANEOUS) IMPLANT
KIT MARKER MARGIN INK (KITS) ×2 IMPLANT
LIGHT WAVEGUIDE WIDE FLAT (MISCELLANEOUS) IMPLANT
NEEDLE HYPO 25X1 1.5 SAFETY (NEEDLE) ×2 IMPLANT
NS IRRIG 1000ML POUR BTL (IV SOLUTION) IMPLANT
PACK BASIN DAY SURGERY FS (CUSTOM PROCEDURE TRAY) ×2 IMPLANT
PENCIL BUTTON HOLSTER BLD 10FT (ELECTRODE) ×2 IMPLANT
SLEEVE SCD COMPRESS KNEE MED (MISCELLANEOUS) ×2 IMPLANT
SPONGE LAP 4X18 X RAY DECT (DISPOSABLE) ×2 IMPLANT
SUT MON AB 5-0 PS2 18 (SUTURE) ×2 IMPLANT
SUT VICRYL 3-0 CR8 SH (SUTURE) ×2 IMPLANT
SYR CONTROL 10ML LL (SYRINGE) ×2 IMPLANT
TOWEL OR 17X24 6PK STRL BLUE (TOWEL DISPOSABLE) ×2 IMPLANT
TOWEL OR NON WOVEN STRL DISP B (DISPOSABLE) ×2 IMPLANT
TUBE CONNECTING 20X1/4 (TUBING) IMPLANT
YANKAUER SUCT BULB TIP NO VENT (SUCTIONS) IMPLANT

## 2017-04-02 NOTE — Transfer of Care (Signed)
Immediate Anesthesia Transfer of Care Note  Patient: Monica [name]  Procedure(s) Performed: BREAST LUMPECTOMY WITH RADIOACTIVE SEED LOCALIZATION (Right Breast)  Patient Location: PACU  Anesthesia Type:General  Level of Consciousness: awake, alert  and oriented  Airway & Oxygen Therapy: Patient Spontanous Breathing and Patient connected to face mask oxygen  Post-op Assessment: Report given to RN and Post -op Vital signs reviewed and stable  Post vital signs: Reviewed and stable  Last Vitals:  Vitals:   04/02/17 0648  BP: (!) 141/70  Pulse: 61  Resp: 18  Temp: 36.8 C  SpO2: 99%    Last Pain:  Vitals:   04/02/17 0648  TempSrc: Oral         Complications: No apparent anesthesia complications

## 2017-04-02 NOTE — Anesthesia Procedure Notes (Signed)
Procedure Name: LMA Insertion Performed by: Verita Lamb Pre-anesthesia Checklist: Patient identified, Emergency Drugs available, Suction available, Patient being monitored and Timeout performed Patient Re-evaluated:Patient Re-evaluated prior to induction Oxygen Delivery Method: Circle system utilized Preoxygenation: Pre-oxygenation with 100% oxygen Induction Type: IV induction LMA: LMA inserted LMA Size: 3.0 Tube type: Oral Number of attempts: 1 Placement Confirmation: positive ETCO2,  CO2 detector and breath sounds checked- equal and bilateral Tube secured with: Tape Dental Injury: Teeth and Oropharynx as per pre-operative assessment

## 2017-04-02 NOTE — H&P (Signed)
History of Present Illness  The patient is a 71 year old female who presents with breast cancer. Patient returns to the office unfortunately with a new diagnosis of DCIS of the right breast. I know her status post left breast lumpectomy and negative axillary sentinel lymph node biopsy for multi focal triple negative cancer of the left breast, status post adjuvant chemotherapy and radiation with no evidence of recurrence with diagnosis in 2015. At her mammogram 6 months ago she had a small area of microcalcifications in the right breast which were biopsied and were benign and 6 month follow-up was recommended. Follow-up mammogram was performed on February 16, 2017 showing stability of the small group of calcifications but a new small 5-6 mm mass in the medial breast. Targeted ultrasound confirmed a 5 mm mass. No adenopathy. Large core needle biopsy was recommended and performed which has revealed low-grade ductal carcinoma in situ arising in a papilloma. The tumor was strongly ER and PR positive. She is now here for treatment planning. She has no symptoms in either breast, specifically lump or pain or nipple discharge or skin changes. She does however complain of some persistent soreness and discomfort at her inferior costal margin on the left that has been worrying her.   Problem List/Past Medical BREAST CANCER, LEFT (C50.912)  ERYTHEMA OF BREAST (L53.9)  RIB PAIN ON LEFT SIDE (R07.81)   Past Surgical History  Breast Biopsy  Left. Breast Mass; Local Excision  Left. Colon Polyp Removal - Colonoscopy  Foot Surgery  Right. Hysterectomy (not due to cancer) - Partial  Knee Surgery  Right.  Diagnostic Studies History  Colonoscopy  5-10 years ago Mammogram  within last year  Allergies  Codeine Phosphate *ANALGESICS - OPIOID*  Codeine Sulfate *ANALGESICS - OPIOID*   Medication History  AmLODIPine Besylate (5MG  Tablet, Oral) Active. Gabapentin (300MG  Capsule, Oral)  Active. Vitamin D3 (2000UNIT Capsule, Oral) Active. Ginkgo Biloba Extract (120MG  Capsule, Oral) Active. OxyCODONE HCl (5MG  Capsule, Oral) Active. Diovan HCT (320-25MG  Tablet, Oral) Active. Meloxicam (15MG  Tablet, Oral) Active. Omega-3 (300MG  Capsule, Oral) Active. OxyCODONE HCl (5MG  Tablet, Oral) Active.  Social History Caffeine use  Carbonated beverages, Coffee, Tea. No alcohol use  No drug use  Tobacco use  Never smoker.  Family History  Arthritis  Mother. Cancer  Brother. Cervical Cancer  Sister. Diabetes Mellitus  Mother.  Pregnancy / Birth History Age at menarche  84 years. Gravida  0 Irregular periods  Para  0  Other Problems  Arthritis  Breast Cancer  High blood pressure   Vitals   Weight: 225.4 lb Height: 66in Body Surface Area: 2.1 m Body Mass Index: 36.38 kg/m  Pulse: 65 (Regular)  BP: 138/74 (Sitting, Left Arm, Standard)       Physical Exam  The physical exam findings are as follows: Note:General: Alert, well-developed and well nourished African-American female, in no distress Skin: Warm and dry without rash or infection. HEENT: No palpable masses or thyromegaly. Sclera nonicteric. Pupils equal round and reactive. Lymph nodes: No cervical, supraclavicular, or nodes palpable. Breasts: No palpable masses in either breast. No nipple inversion or crusting. No skin lesions. Chest wall: There is tenderness over the costal margin on the left side without palpable abnormality. Lungs: Breath sounds clear and equal. No wheezing or increased work of breathing. Cardiovascular: Regular rate and rhythm without murmer. No JVD or edema. Extremities: No edema or joint swelling or deformity. No chronic venous stasis changes. Neurologic: Alert and fully oriented. Gait normal. No focal weakness. Psychiatric: Normal  mood and affect. Thought content appropriate with normal judgement and insight    Assessment & Plan  DUCTAL CARCINOMA  IN SITU (DCIS) OF RIGHT BREAST (D05.11) Impression: Patient with personal history of triple negative left breast cancer status post lumpectomy, negative axillary sentinel lymph node biopsy, adjuvant chemotherapy and radiation. In remission from this. She now presents with a new hormone positive small DCIS arising in a papilloma in the right breast. I discussed the diagnosis in detail with the patient and her family. We discussed surgical options and she would prefer breast conservation and would obviously be a good candidate for this. I recommend proceeding with radioactive seed localized lumpectomy. We discussed the possibility of radiation therapy and chemotherapy prevention. I discussed the nature of surgery and indications, alternatives, risks of bleeding, infection, wound healing issues, anesthetic complications and possible need for further surgery based on final pathology. She understands and would like to proceed.  I expect that her left rib pain represents costochondritis or muscle strain. We will check a rib detail x-ray to be complete. Current Plans Radioactive seed localized right breast lumpectomy under general anesthesia as an outpatient

## 2017-04-02 NOTE — Interval H&P Note (Signed)
History and Physical Interval Note:  04/02/2017 7:36 AM  Monica [name]  has presented today for surgery, with the diagnosis of DCIS RIGHT BREAST   The various methods of treatment have been discussed with the patient and family. After consideration of risks, benefits and other options for treatment, the patient has consented to  Procedure(s): BREAST LUMPECTOMY WITH RADIOACTIVE SEED LOCALIZATION (Right) as a surgical intervention .  The patient's history has been reviewed, patient examined, no change in status, stable for surgery.  I have reviewed the patient's chart and labs.  Questions were answered to the patient's satisfaction.     Julianah Marciel T

## 2017-04-02 NOTE — Anesthesia Postprocedure Evaluation (Signed)
Anesthesia Post Note  Patient: Monica [name]  Procedure(s) Performed: BREAST LUMPECTOMY WITH RADIOACTIVE SEED LOCALIZATION (Right Breast)     Patient location during evaluation: PACU Anesthesia Type: General Level of consciousness: awake and alert Pain management: pain level controlled Vital Signs Assessment: post-procedure vital signs reviewed and stable Respiratory status: spontaneous breathing, nonlabored ventilation, respiratory function stable and patient connected to nasal cannula oxygen Cardiovascular status: blood pressure returned to baseline and stable Postop Assessment: no apparent nausea or vomiting Anesthetic complications: no    Last Vitals:  Vitals:   04/02/17 0930 04/02/17 1041  BP: (!) 149/81 (!) 148/80  Pulse: 73 62  Resp: (!) 22   Temp:  36.4 C  SpO2: 100% 98%    Last Pain:  Vitals:   04/02/17 1041  TempSrc: Oral  PainSc: 2                  Montez Hageman

## 2017-04-02 NOTE — Anesthesia Preprocedure Evaluation (Signed)
Anesthesia Evaluation  Patient identified by MRN, date of birth, ID band Patient awake    Reviewed: Allergy & Precautions, NPO status , Patient's Chart, lab work & pertinent test results  History of Anesthesia Complications (+) PONV  Airway Mallampati: I  TM Distance: >3 FB Neck ROM: Full    Dental no notable dental hx.    Pulmonary neg pulmonary ROS,    Pulmonary exam normal breath sounds clear to auscultation       Cardiovascular hypertension, negative cardio ROS Normal cardiovascular exam Rhythm:Regular Rate:Normal     Neuro/Psych negative neurological ROS  negative psych ROS   GI/Hepatic negative GI ROS, Neg liver ROS,   Endo/Other  negative endocrine ROS  Renal/GU negative Renal ROS  negative genitourinary   Musculoskeletal negative musculoskeletal ROS (+)   Abdominal   Peds negative pediatric ROS (+)  Hematology negative hematology ROS (+)   Anesthesia Other Findings   Reproductive/Obstetrics negative OB ROS                             Anesthesia Physical Anesthesia Plan  ASA: II  Anesthesia Plan: General   Post-op Pain Management:    Induction: Intravenous  PONV Risk Score and Plan: 4 or greater and Ondansetron, Dexamethasone, Midazolam, Scopolamine patch - Pre-op and Treatment may vary due to age or medical condition  Airway Management Planned: LMA  Additional Equipment:   Intra-op Plan:   Post-operative Plan: Extubation in OR  Informed Consent: I have reviewed the patients History and Physical, chart, labs and discussed the procedure including the risks, benefits and alternatives for the proposed anesthesia with the patient or authorized representative who has indicated his/her understanding and acceptance.   Dental advisory given  Plan Discussed with: CRNA  Anesthesia Plan Comments:         Anesthesia Quick Evaluation

## 2017-04-02 NOTE — Discharge Instructions (Signed)
°Post Anesthesia Home Care Instructions ° °Activity: °Get plenty of rest for the remainder of the day. A responsible individual must stay with you for 24 hours following the procedure.  °For the next 24 hours, DO NOT: °-Drive a car °-Operate machinery °-Drink alcoholic beverages °-Take any medication unless instructed by your physician °-Make any legal decisions or sign important papers. ° °Meals: °Start with liquid foods such as gelatin or soup. Progress to regular foods as tolerated. Avoid greasy, spicy, heavy foods. If nausea and/or vomiting occur, drink only clear liquids until the nausea and/or vomiting subsides. Call your physician if vomiting continues. ° °Special Instructions/Symptoms: °Your throat may feel dry or sore from the anesthesia or the breathing tube placed in your throat during surgery. If this causes discomfort, gargle with warm salt water. The discomfort should disappear within 24 hours. ° °If you had a scopolamine patch placed behind your ear for the management of post- operative nausea and/or vomiting: ° °1. The medication in the patch is effective for 72 hours, after which it should be removed.  Wrap patch in a tissue and discard in the trash. Wash hands thoroughly with soap and water. °2. You may remove the patch earlier than 72 hours if you experience unpleasant side effects which may include dry mouth, dizziness or visual disturbances. °3. Avoid touching the patch. Wash your hands with soap and water after contact with the patch. °  ° ° ° ° °[facility] °Office Phone Number [phone] ° °BREAST BIOPSY/ PARTIAL MASTECTOMY: POST OP INSTRUCTIONS ° °Always review your discharge instruction sheet given to you by the facility where your surgery was performed. ° °IF YOU HAVE DISABILITY OR FAMILY LEAVE FORMS, YOU MUST BRING THEM TO THE OFFICE FOR PROCESSING.  DO NOT GIVE THEM TO YOUR DOCTOR. ° °1. A prescription for pain medication may be given to you upon discharge.  Take your  pain medication as prescribed, if needed.  If narcotic pain medicine is not needed, then you may take acetaminophen (Tylenol) or ibuprofen (Advil) as needed. °2. Take your usually prescribed medications unless otherwise directed °3. If you need a refill on your pain medication, please contact your pharmacy.  They will contact our office to request authorization.  Prescriptions will not be filled after 5pm or on week-ends. °4. You should eat very light the first 24 hours after surgery, such as soup, crackers, pudding, etc.  Resume your normal diet the day after surgery. °5. Most patients will experience some swelling and bruising in the breast.  Ice packs and a good support bra will help.  Swelling and bruising can take several days to resolve.  °6. It is common to experience some constipation if taking pain medication after surgery.  Increasing fluid intake and taking a stool softener will usually help or prevent this problem from occurring.  A mild laxative (Milk of Magnesia or Miralax) should be taken according to package directions if there are no bowel movements after 48 hours. °7. Unless discharge instructions indicate otherwise, you may remove your bandages 24-48 hours after surgery, and you may shower at that time.  You may have steri-strips (small skin tapes) in place directly over the incision.  These strips should be left on the skin for 7-10 days.  If your surgeon used skin glue on the incision, you may shower in 24 hours.  The glue will flake off over the next 2-3 weeks.  Any sutures or staples will be removed at the office during your follow-up visit. °  8. ACTIVITIES:  You may resume regular daily activities (gradually increasing) beginning the next day.  Wearing a good support bra or sports bra minimizes pain and swelling.  You may have sexual intercourse when it is comfortable. °a. You may drive when you no longer are taking prescription pain medication, you can comfortably wear a seatbelt, and you can  safely maneuver your car and apply brakes. °b. RETURN TO WORK:  ______________________________________________________________________________________ °9. You should see your doctor in the office for a follow-up appointment approximately two weeks after your surgery.  Your doctor’s nurse will typically make your follow-up appointment when she calls you with your pathology report.  Expect your pathology report 2-3 business days after your surgery.  You may call to check if you do not hear from us after three days. °10. OTHER INSTRUCTIONS: _______________________________________________________________________________________________ _____________________________________________________________________________________________________________________________________ °_____________________________________________________________________________________________________________________________________ °_____________________________________________________________________________________________________________________________________ ° °WHEN TO CALL YOUR DOCTOR: °1. Fever over 101.0 °2. Nausea and/or vomiting. °3. Extreme swelling or bruising. °4. Continued bleeding from incision. °5. Increased pain, redness, or drainage from the incision. ° °The clinic staff is available to answer your questions during regular business hours.  Please don’t hesitate to call and ask to speak to one of the nurses for clinical concerns.  If you have a medical emergency, go to the nearest emergency room or call 911.  A surgeon from [facility] is always on call at the hospital. ° °For further questions, please visit [email or web address]  °

## 2017-04-02 NOTE — Op Note (Signed)
Preoperative Diagnosis: DCIS RIGHT BREAST   Postoprative Diagnosis: DCIS RIGHT BREAST   Procedure: Procedure(s): BREAST LUMPECTOMY WITH RADIOACTIVE SEED LOCALIZATION   Surgeon: Excell Seltzer T   Assistants: None  Anesthesia:  General LMA anesthesia  Indications: Patient is a 71 year old female with a previous personal history of left breast cancer and recent diagnosis of DCIS arising in a papilloma in a 4-5 mm area of calcifications in the medial right breast. After discussion regarding options and risks detailed elsewhere we have elected to proceed with radioactive seed localized lumpectomy as initial surgical treatment. A second area of calcifications in the more lateral right breast was recently biopsied showing benign fibroadenoma.    Procedure Detail:  Patient had previously undergone accurate placement of a radioactive seed at the site of the biopsy clip and calcifications. Patient was taken to the operating room, placed in supine position on the operating table, and laryngeal mask general anesthesia induced. She received preoperative IV antibiotics. PAS were in place. The right breast was widely sterilely prepped and draped. Patient timeout was performed and correct procedure verified. The neoprobe was used to localize the seed quite medially in the right breast. I used a circumareolar medial incision and dissection was carried down into the subcutaneous tissue. Using the Invuity retractor a skin subcutaneous flap was raised medially over the site of the seed confirmed with the neoprobe. A globular specimen of breast tissue was then excised with cautery around the area of high counts. This was fairly close to the skin and the subcutaneous tissue anteriorly. The specimen was removed and inked for margins. Specimen x-ray showed the seed and marking clip within the specimen but is suspected somewhat close to the superficial margin. I excised further subcutaneous tissue to essentially skin  as additional superficial margin which was oriented and sent as a second specimen. Soft tissue was infiltrated with Marcaine. Complete hemostasis was assured after irrigating the wound. The lumpectomy cavity was marked with clips. The deep breast and subcutaneous tissues was closed with interrupted 3-0 Vicryl. Skin was closed with running subcuticular 5-0 Monocryl and Dermabond. Sponge needle and instrument counts were correct.    Findings: As above  Estimated Blood Loss:  Minimal         Drains: None  Blood Given: none          Specimens: #1 right breast lumpectomy  #2 additional superficial margin        Complications:  * No complications entered in OR log *         Disposition: PACU - hemodynamically stable.         Condition: stable

## 2017-04-05 ENCOUNTER — Encounter (HOSPITAL_BASED_OUTPATIENT_CLINIC_OR_DEPARTMENT_OTHER): Payer: Self-pay | Admitting: General Surgery

## 2017-04-07 DIAGNOSIS — M170 Bilateral primary osteoarthritis of knee: Secondary | ICD-10-CM | POA: Diagnosis not present

## 2017-04-07 DIAGNOSIS — I10 Essential (primary) hypertension: Secondary | ICD-10-CM | POA: Diagnosis not present

## 2017-04-07 DIAGNOSIS — E039 Hypothyroidism, unspecified: Secondary | ICD-10-CM | POA: Diagnosis not present

## 2017-04-07 DIAGNOSIS — M5126 Other intervertebral disc displacement, lumbar region: Secondary | ICD-10-CM | POA: Diagnosis not present

## 2017-04-07 DIAGNOSIS — Z853 Personal history of malignant neoplasm of breast: Secondary | ICD-10-CM | POA: Diagnosis not present

## 2017-04-07 DIAGNOSIS — Z6836 Body mass index (BMI) 36.0-36.9, adult: Secondary | ICD-10-CM | POA: Diagnosis not present

## 2017-04-08 DIAGNOSIS — M4317 Spondylolisthesis, lumbosacral region: Secondary | ICD-10-CM | POA: Diagnosis not present

## 2017-04-08 DIAGNOSIS — M5126 Other intervertebral disc displacement, lumbar region: Secondary | ICD-10-CM | POA: Diagnosis not present

## 2017-04-08 DIAGNOSIS — R69 Illness, unspecified: Secondary | ICD-10-CM | POA: Diagnosis not present

## 2017-04-08 DIAGNOSIS — M5416 Radiculopathy, lumbar region: Secondary | ICD-10-CM | POA: Diagnosis not present

## 2017-04-08 DIAGNOSIS — M47812 Spondylosis without myelopathy or radiculopathy, cervical region: Secondary | ICD-10-CM | POA: Diagnosis not present

## 2017-04-08 DIAGNOSIS — Z79899 Other long term (current) drug therapy: Secondary | ICD-10-CM | POA: Diagnosis not present

## 2017-04-09 DIAGNOSIS — M170 Bilateral primary osteoarthritis of knee: Secondary | ICD-10-CM | POA: Diagnosis not present

## 2017-04-09 DIAGNOSIS — G8929 Other chronic pain: Secondary | ICD-10-CM | POA: Diagnosis not present

## 2017-04-09 DIAGNOSIS — M25561 Pain in right knee: Secondary | ICD-10-CM | POA: Diagnosis not present

## 2017-04-09 DIAGNOSIS — M25562 Pain in left knee: Secondary | ICD-10-CM | POA: Diagnosis not present

## 2017-04-26 ENCOUNTER — Other Ambulatory Visit (HOSPITAL_BASED_OUTPATIENT_CLINIC_OR_DEPARTMENT_OTHER): Payer: Medicare HMO

## 2017-04-26 ENCOUNTER — Other Ambulatory Visit: Payer: Self-pay | Admitting: *Deleted

## 2017-04-26 DIAGNOSIS — C50919 Malignant neoplasm of unspecified site of unspecified female breast: Secondary | ICD-10-CM

## 2017-04-26 DIAGNOSIS — C50512 Malignant neoplasm of lower-outer quadrant of left female breast: Secondary | ICD-10-CM

## 2017-04-26 LAB — CBC WITH DIFFERENTIAL/PLATELET
BASO%: 0.1 % (ref 0.0–2.0)
BASOS ABS: 0 10*3/uL (ref 0.0–0.1)
EOS ABS: 0.1 10*3/uL (ref 0.0–0.5)
EOS%: 2.8 % (ref 0.0–7.0)
HCT: 39.8 % (ref 34.8–46.6)
HEMOGLOBIN: 13 g/dL (ref 11.6–15.9)
LYMPH%: 38.5 % (ref 14.0–49.7)
MCH: 29.5 pg (ref 25.1–34.0)
MCHC: 32.6 g/dL (ref 31.5–36.0)
MCV: 90.3 fL (ref 79.5–101.0)
MONO#: 0.3 10*3/uL (ref 0.1–0.9)
MONO%: 8.6 % (ref 0.0–14.0)
NEUT#: 2 10*3/uL (ref 1.5–6.5)
NEUT%: 50 % (ref 38.4–76.8)
Platelets: 251 10*3/uL (ref 145–400)
RBC: 4.41 10*6/uL (ref 3.70–5.45)
RDW: 14.6 % — AB (ref 11.2–14.5)
WBC: 4 10*3/uL (ref 3.9–10.3)
lymph#: 1.5 10*3/uL (ref 0.9–3.3)

## 2017-04-26 LAB — COMPREHENSIVE METABOLIC PANEL
ALT: 15 U/L (ref 0–55)
ANION GAP: 8 meq/L (ref 3–11)
AST: 21 U/L (ref 5–34)
Albumin: 3.6 g/dL (ref 3.5–5.0)
Alkaline Phosphatase: 99 U/L (ref 40–150)
BUN: 18 mg/dL (ref 7.0–26.0)
CHLORIDE: 106 meq/L (ref 98–109)
CO2: 28 meq/L (ref 22–29)
CREATININE: 0.9 mg/dL (ref 0.6–1.1)
Calcium: 9.9 mg/dL (ref 8.4–10.4)
EGFR: >60 mL/min/{1.73_m2} (ref >60)
Glucose: 90 mg/dl (ref 70–140)
Potassium: 4 mEq/L (ref 3.5–5.1)
Sodium: 142 mEq/L (ref 136–145)
Total Bilirubin: 0.52 mg/dL (ref 0.20–1.20)
Total Protein: 7.5 g/dL (ref 6.4–8.3)

## 2017-04-29 ENCOUNTER — Encounter: Payer: Self-pay | Admitting: Radiation Oncology

## 2017-04-30 NOTE — Progress Notes (Addendum)
Location of Breast Cancer:Upper-inner quadrant of right breast in female,Ductal carcinoma in situ   Histology per Pathology Report:  Diagnosis 04-02-17 1. Breast, lumpectomy, Right w/seed - PAPILLARY DUCTAL CARCINOMA IN SITU, LOW GRADE, 0.2 CM - MARGINS UNINVOLVED BY CARCINOMA (INFERIOR) - SEE ONCOLOGY TABLE AND COMMENT BELOW 2. Breast, excision, Additional superficial margin right - BENIGN BREAST TISSUE - NO RESIDUAL CARCINOMA IDENTIFIED Microscopic Comment 1. BREAST, IN SITU CARCINOMA Specimen, including laterality: Right   Receptor Status: ER(100 % +), PR (95 % +), Her2-neu (), Ki-()  Diagnosis 03-23-17 Breast, right, needle core biopsy, upper outer quadrant - FIBROADENOMA WITH CALCIFICATIONS. - THERE IS NO EVIDENCE OF MALIGNANCY. - SEE COMMENT. Microscopic Comment The results were called to [facility] on 03/24/17. (JBK:gt, 03/24/17)  Diagnosis 02-17-17 Breast, right, needle core biopsy, 2:00 o'clock - DUCTAL CARCINOMA IN SITU, ARISING IN A PAPILLOMA - SEE COMMENT Microscopic Comment Based on the biopsy, the ductal carcinoma in situ is low nuclear grade and measures 0.4 cm in greatest linear extent. Prognostic markers (ER/PR) are pending and will be reported in an addendum. Dr. Lyndon Code has reviewed the case and agrees with the diagnosis. These results were called to [facility] on February 18, 2017. Thressa Sheller MD Pathologist, Electronic Signature (Case signed 02/18/2017) Specimen Gross and Clinical Informatio  Receptor Status: ER(100 % +), PR (95 % +), Her2-neu (), Ki-()  Did patient present with symptoms (if so, please note symptoms) or was this found on screening mammography?:  March 23, 2017 she underwent routine annual left mammography, which showed the breast density to be category B.  There were no suspicious areas.  On the same day she underwent right breast clip placement after stereotactic biopsy of a suspicious right breast  finding.  This found ([id number]) fibroadenoma, with no evidence of malignancy.  This was found to be concordant.  Finally on October 12 she proceeded to right lumpectomy of the original area of concern, and this showed (SZA 18-09/27/2005) papillary ductal carcinoma in situ, grade 1.  Past/Anticipated interventions by surgeon, if any:Dr. Excell Seltzer 04-02-17  Breast, lumpectomy, Right w/seed,  Breast, excision, Additional superficial margin right, 03-23-17  Breast, excision, Additional superficial margin right 02-17-17 Breast, right, needle core biopsy, 2:00 o'clock    Past/Anticipated interventions by medical oncology, if any: Chemotherapy No, Dr. Jana Hakim  anastrozole started 05/03/2017, considering adjuvant radiation    Lymphedema issues, if any: No Skin to right breast still with bruising healing well has some fluid pockets saw Dr. Jana Hakim on Monday he feels things look good, saw Dr. Corey Harold 04-23-17 he feels she is doing okay will see again 06-03-17 to recheck the fluid pockets will drain them if they have not decreased. She has good ROM of both arms without pain. Pain issues, if any: No  SAFETY ISSUES:   Prior radiation? : yes 08-27-2014 through 09-21-2014 left breast  Pacemaker/ICD? : No  Possible current pregnancy?: No  Is the patient on methotrexate? 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 Wt Readings from Last 3 Encounters:  05/05/17 223 lb 9.6 oz (101.4 kg)  05/03/17  223 lb 12.8 oz (101.5 kg)  04/02/17 227 lb (103 kg)  BP (!) 151/89 (BP Location: Right Leg, Patient Position: Sitting, Cuff Size: Normal)   Pulse 74   Temp 97.7 F (36.5 C) (Oral)   Resp 20   Ht 5' 6.5"  (1.689 m)   Wt 223 lb 9.6 oz (101.4 kg)   SpO2 99%   BMI 35.55 kg/m     Georgena Spurling, RN 04/30/2017,3:48 PM

## 2017-05-03 ENCOUNTER — Telehealth: Payer: Self-pay | Admitting: Oncology

## 2017-05-03 ENCOUNTER — Ambulatory Visit: Payer: Medicare HMO | Admitting: Oncology

## 2017-05-03 VITALS — BP 146/80 | HR 75 | Temp 98.1°F | Resp 20 | Ht 66.5 in | Wt 223.8 lb

## 2017-05-03 DIAGNOSIS — Z853 Personal history of malignant neoplasm of breast: Secondary | ICD-10-CM | POA: Diagnosis not present

## 2017-05-03 DIAGNOSIS — D0511 Intraductal carcinoma in situ of right breast: Secondary | ICD-10-CM

## 2017-05-03 DIAGNOSIS — Z170 Estrogen receptor positive status [ER+]: Secondary | ICD-10-CM

## 2017-05-03 DIAGNOSIS — Z79811 Long term (current) use of aromatase inhibitors: Secondary | ICD-10-CM | POA: Diagnosis not present

## 2017-05-03 DIAGNOSIS — E039 Hypothyroidism, unspecified: Secondary | ICD-10-CM | POA: Diagnosis not present

## 2017-05-03 DIAGNOSIS — Z923 Personal history of irradiation: Secondary | ICD-10-CM | POA: Diagnosis not present

## 2017-05-03 DIAGNOSIS — C50211 Malignant neoplasm of upper-inner quadrant of right female breast: Secondary | ICD-10-CM

## 2017-05-03 DIAGNOSIS — Z9221 Personal history of antineoplastic chemotherapy: Secondary | ICD-10-CM

## 2017-05-03 DIAGNOSIS — C50512 Malignant neoplasm of lower-outer quadrant of left female breast: Secondary | ICD-10-CM

## 2017-05-03 MED ORDER — ANASTROZOLE 1 MG PO TABS: 1.0000 mg | ORAL_TABLET | Freq: Every day | ORAL | 4 refills | Status: DC

## 2017-05-03 NOTE — Telephone Encounter (Signed)
Gave patient avs and calendar with appts per 11/12 los.  °

## 2017-05-03 NOTE — Progress Notes (Signed)
[place]  Telephone:([phone]) [phone] Fax:(336) [phone]     ID: Monica [name] DOB: Dec 13, 1945  MR#: [id number]  QQV#:[id number]  Patient Care Team: Maury Dus, MD as PCP - General (Family Medicine) Excell Seltzer, MD as Consulting Physician (General Surgery) Jaydn Moscato, Virgie Dad, MD as Consulting Physician (Oncology) Kyung Rudd, MD as Consulting Physician (Radiation Oncology) Maisie Fus, MD as Consulting Physician (Obstetrics and Gynecology) Kennith Center, RD as Dietitian (Family Medicine) Marlaine Hind, MD as Consulting Physician (Physical Medicine and Rehabilitation)   CHIEF COMPLAINT: Essentially triple negative breast cancer (very weak estrogen positivity). Her  CURRENT TREATMENT:  observation  BREAST CANCER HISTORY: From the original intake note:  Monica [name] showing a possible mass in her left breast. On 01/05/2014 left diagnostic mammography and ultrasonography at the breast Center showed 2 adjacent poorly defined masses deep in the lower outer portion of the left breast. On physical exam these were palpable measuring approximately 3 cm altogether, at the 4:00 position 16 cm from the left nipple. There were no palpable left axillary lymph nodes. Ultrasound confirmed to and adjacent irregularly marginated hypoechoic masses in the area in question, measuring 1.5 and 1.0 cm respectively. The total area of by ultrasonography measured 2.6 cm. In addition, to left axillary lymph nodes showed mild cortical thickening in one of them showed loss of the normal fatty hilum.  On 01/10/2014 the patient underwent separate biopsies of both the left breast masses in question as well as the more suspicious lymph node. The final pathology ([id number]) showed the lymph node to be benign. (This was interpreted as concordant). Both of the breast masses, however, were positive for an invasive ductal carcinoma, which was  HER-2 not amplified, with the signals ratio of 0.98 and the number per cell being 2.45. Estrogen and progesterone receptor determination is pending but looks negative at this point.  On 01/16/2014 the patient underwent bilateral breast MRI the right breast was unremarkable. In the left breast lower outer quadrant there were 2 oval masses measuring 1.5 and 2.2 cm. Taken together they was approximately 4 cm of disease. There were no findings of concern in the right breast. In the left axilla there was a single mildly prominent lymph node. There was no suspicious internal mammary adenopathy.  The patient's subsequent history is as detailed below  INTERVAL HISTORY: Monica [name] returns today for follow-up of her prior essentially estrogen receptor negative breast cancer and her new noninvasive estrogen receptor positive breast cancer.   We had been following her right breast of course very closely and surgery had been planned.  Since her last visit here, on March 23, 2017 she underwent routine annual left mammography, which showed the breast density to be category B.  There were no suspicious areas.  On the same day she underwent right breast clip placement after stereotactic biopsy of a suspicious right breast finding.  This found ([id number]) fibroadenoma, with no evidence of malignancy.  This was found to be concordant.  Finally on October 12 she proceeded to right lumpectomy of the original area of concern, and this showed (SZA 18-09/27/2005) papillary ductal carcinoma in situ, grade 1, measuring 0.2 cm, with negative margins.  She is already scheduled with Dr. Lisbeth Renshaw later this week for discussion of possible radiation adjuvantly  REVIEW OF SYSTEMS: Monica [name] generally well with the surgery, although she still has sharp pains in the right breast and is very tender and sensitive.  She has to  wear a sports bra at night.  She has developed a seroma and is following this closely with Dr. Excell Seltzer.  Also she was  found to be severely hypothyroid, with a [name] greater than 100 she says.  She is now feeling considerably better on Synthroid.  Because of arthritis in her knees she cannot use her husband's treadmill.  She does have a gym membership but is not currently active. A detailed review of systems today was otherwise stable.   PAST MEDICAL HISTORY: Past Medical History:  Diagnosis Date  . Allergy codeine   rapid heart beat, cold sweat  . Arthritis   . Breast cancer ([name]) 01/10/14   Left breast INVASIVE DUCTAL CARCINOMA STAGE 2  . Hypertension   . Hyperthyroidism    medication called in today for hyperthroid  . Personal history of chemotherapy 2015   Left Breast Cancer  . Personal history of radiation therapy 2015   Left Breast Cancer  . PONV (postoperative nausea and vomiting)   . S/P radiation therapy 09/21/14 completed   left breast    PAST SURGICAL HISTORY: Past Surgical History:  Procedure Laterality Date  . ABDOMINAL HYSTERECTOMY  1992  . BREAST LUMPECTOMY Left 02/12/2014  . COLONOSCOPY    . FOOT ARTHROPLASTY  1998   right  . KNEE ARTHROSCOPY  2012   right  . LUMBAR LAMINECTOMY  1991    FAMILY HISTORY Family History  Problem Relation Age of Onset  . Cancer Sister   . Cancer Brother   . Cancer Maternal Aunt   . Cancer Paternal Aunt    the patient's father died at the age of 21 from a stroke. The patient's mother died at the age of 78 from "multiple causes". The patient had 2 brothers and 2 sisters. The patient's sister was diagnosed with uterine cancer at the age of 35, and the patient's brother Monica [name] had "bone cancer" and died in his 76s. He was treated at the [facility]. There is no history of breast or ovarian cancer in the family to the patient's knowledge.  GYNECOLOGIC HISTORY:  No LMP recorded. Patient has had a hysterectomy. Menarche age 44, the patient is GX P0. She underwent hysterectomy in 1992, with no salpingo-oophorectomy. She has been on  estrogen replacement since. 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 Her husband [name] worked for [employer] in [place] as a Facilities manager. The patient's adopted son [name] works as a laborer in [place]. The patient's god-daughter [name] is an Optometrist. The patient has no grandchildren. The patient attends a local church of  [place], and her husband [name] is pastor   ADVANCED DIRECTIVES: In place   HEALTH MAINTENANCE: Social History   Tobacco Use  . Smoking status: Never Smoker  . Smokeless tobacco: Never Used  Substance Use Topics  . Alcohol use: No  . Drug use: No     Colonoscopy: October 2008  PAP: July 2015  Bone density: 01/01/2014  Lipid panel:  Allergies  Allergen Reactions  . Codeine Other (See Comments)    Heart fast  . Adhesive [Tape]     Current Outpatient Medications  Medication Sig Dispense Refill  . amLODipine (NORVASC) 5 MG tablet Take 5 mg by mouth daily.    [name] anastrozole (ARIMIDEX) 1 MG tablet Take 1 tablet (1 mg total) daily by mouth. 90 tablet 4  .  Cholecalciferol (VITAMIN D3) 2000 UNITS TABS Take 1 tablet by mouth daily.    . cloNIDine (CATAPRES-TTS-1) 0.1 mg/24hr patch Place 1 patch (0.1 mg total) onto the skin once a week. 12 patch 3  . diclofenac sodium (VOLTAREN) 1 % GEL Apply 2 g topically 4 (four) times daily.    [name] docusate sodium (COLACE) 100 MG capsule Take 100 mg by mouth daily as needed for mild constipation.     . gabapentin (NEURONTIN) 300 MG capsule Take 1 capsule (300 mg total) by mouth at bedtime. 90 capsule 0  . Ginkgo Biloba 120 MG CAPS Take 1 capsule by mouth daily.    [name] levothyroxine (SYNTHROID, LEVOTHROID) 25 MCG tablet Take 25 mcg by mouth daily before breakfast.    . meloxicam (MOBIC) 15 MG tablet Take 15 mg by mouth daily.    . Omega-3 Fatty Acids (FISH OIL CONCENTRATE) 300 MG CAPS  Take by mouth.    . oxyCODONE-acetaminophen (PERCOCET/ROXICET) 5-325 MG tablet Take 1 tablet by mouth every 4 (four) hours as needed for severe pain.    . potassium chloride SA (K-DUR,KLOR-CON) 20 MEQ tablet Take 20 mEq by mouth once.    . traMADol (ULTRAM) 50 MG tablet Take 1 tablet (50 mg total) by mouth every 6 (six) hours as needed. 15 tablet 1  . valsartan-hydrochlorothiazide (DIOVAN-HCT) 320-25 MG per tablet Take 1 tablet by mouth daily.     No current facility-administered medications for this visit.     OBJECTIVE: Middle-aged [place] American woman who appears stated age  63:   05/03/17 1225  BP: (!) 146/80  Pulse: 75  Resp: 20  Temp: 98.1 F (36.7 C)  SpO2: 100%     Body mass index is 35.58 kg/m.    ECOG FS:1 - Symptomatic but completely ambulatory  Sclerae unicteric, EOMs intact Oropharynx clear and moist No cervical or supraclavicular adenopathy Lungs no rales or rhonchi Heart regular rate and rhythm Abd soft, nontender, positive bowel sounds MSK no focal spinal tenderness, no upper extremity lymphedema Neuro: nonfocal, well oriented, appropriate affect Breasts: The right breast is status post lumpectomy.  The incisions are healing very nicely.  In the upper inner quadrant there is an area measuring approximately 4 x 2 cm which appears fluctuant, without associated erythema.  The left breast is benign.  Both axillae are benign.  LAB RESULTS:  CMP     Component Value Date/Time   NA 142 04/26/2017 1126   K 4.0 04/26/2017 1126   CL 103 03/29/2017 1015   CO2 28 04/26/2017 1126   GLUCOSE 90 04/26/2017 1126   BUN 18.0 04/26/2017 1126   CREATININE 0.9 04/26/2017 1126   CALCIUM 9.9 04/26/2017 1126   PROT 7.5 04/26/2017 1126   ALBUMIN 3.6 04/26/2017 1126   AST 21 04/26/2017 1126   ALT 15 04/26/2017 1126   ALKPHOS 99 04/26/2017 1126   BILITOT 0.52 04/26/2017 1126   GFRNONAA 55 (L) 03/29/2017 1015   GFRAA >60 03/29/2017 1015    I No results found for:  SPEP  Lab Results  Component Value Date   WBC 4.0 04/26/2017   NEUTROABS 2.0 04/26/2017   HGB 13.0 04/26/2017   HCT 39.8 04/26/2017   MCV 90.3 04/26/2017   PLT 251 04/26/2017      Chemistry      Component Value Date/Time   NA 142 04/26/2017 1126   K 4.0 04/26/2017 1126   CL 103 03/29/2017 1015   CO2 28 04/26/2017 1126   BUN 18.0 04/26/2017  1126   CREATININE 0.9 04/26/2017 1126      Component Value Date/Time   CALCIUM 9.9 04/26/2017 1126   ALKPHOS 99 04/26/2017 1126   AST 21 04/26/2017 1126   ALT 15 04/26/2017 1126   BILITOT 0.52 04/26/2017 1126       No results found for: LABCA2  No components found for: LABCA125  No results for input(s): INR in the last 168 hours.  Urinalysis    Component Value Date/Time   COLORURINE AMBER (A) 01/16/2016 1751    STUDIES: Pathology reports discussed with the patient who also received copies  ASSESSMENT: 71 y.o. Monica [name] woman status post left breast lower outer quadrant biopsy of 2 separate lower outer quadrant masses 01/10/2014 for a multifocal cT2 cNX, stage II invasive ductal carcinoma, grade 3, with an MIB-1 of 90%, HER-2 negative, estrogen receptors 3% positive with weak staining, progesterone receptor negative, with an MIB-1 of 90%; this is clinically a "triple-negative" breast cancer and will be treated as such  (1) a suspicious left axillary lymph node biopsied 01/10/2014 was negative (concordant)  (2) left lumpectomy and sentinel lymph node sampling 02/12/2014 showed an mpT2 pN0, stage IIA invasive ductal carcinoma grade 3, repeat HER-2 again negative  (3) adjuvant chemotherapy consisted of  (a) dose dense doxorubicin and cyclophosphamide x4 completed 05/02/2014  (b) weekly paclitaxel and carboplatin with 3 of 12 doses given, then stopped because of neuropathy  (c) Carboplatin and Gemzar started 06/11/2014, given on day 1 & day 8 of a 21 days cycle for 3 cycles, completed 07/30/2014  (4) adjuvant radiation  08/27/2014 through 09/21/2014:  The patient initially received a dose of 42.5 Gy in 17 fractions to the left breast using whole-breast tangent fields. This was delivered using a 3-D conformal technique. The patient then received a boost to the seroma. This delivered an additional 7.5 Gy in 3 fractions using a 3 field photon technique due to the depth of the seroma. The total dose was 50 Gy.  (5) tamoxifen started (prophylactically) in December 2016; stopped after 2 weeks due to side effects.   (a) given the very weak and low estrogen receptor positivity, resumption of anti-estrogens not anticipated  (6) right breast upper outer quadrant biopsy 04/16/2016 showed no evidence of malignancy  (7) right lumpectomy April 02, 2017 showed ductal carcinoma in situ, grade 1, measuring 0.2 cm, with negative margins.  This was estrogen receptor 100% positive and progesterone receptor 95% positive.  (8) anastrozole started 05/03/2017  (9) considering adjuvant radiation  PLAN:  Monica [name] did well with her recent surgery.  Today we discussed a ductal carcinoma in situ in detail. Monica [name] understands that in noninvasive ductal carcinoma, also called ductal carcinoma in situ ("DCIS") the breast cancer cells remain trapped in the ducts were they started. They cannot travel to a vital organ. For that reason these cancers in themselves are not life-threatening.  I think she has 3 options.  She can proceed to radiation and then consider antiestrogens, she can take antiestrogens and consider radiation, or she can do an either radiation or antiestrogens would be comfortable with any of those options.  Today we discussed anastrozole in detail.  She has a good understanding of the possible toxicities, side effects and complications.  She is very interested in taking this medication because of course it will cut the risk of her developing yet another breast cancer in the future in half.  It also may allow her to "escape"  receiving adjuvant radiation.  She understands she  will need to discuss that with radiation oncology.  Given the small size of the tumor and the negative margins, that would not be an unreasonable option  She will start anastrozole today.  She will make her radiation decision within the next 2 weeks.  She will return to see me sometime late January.  If she tolerates anastrozole well the plan will be to continue that for a total of 5 years.  She understands that 1 of the problems this medicine may cause it is worsening osteopenia and I am checking a vitamin D level prior to that visit  She knows to call for any other problems that may develop before then.   Monica [name], Virgie Dad, MD  05/03/17 1:03 PM Medical Oncology and Hematology [facility] [address] [place], [place] [address] Tel. [phone]    Fax. [phone]  This document serves as a record of services personally performed by Chauncey Cruel, MD. It was created on his behalf by Margit Banda, a trained medical scribe. The creation of this record is based on the scribe's personal observations and the provider's statements to them.   I have reviewed the above documentation for accuracy and completeness, and I agree with the above.

## 2017-05-05 ENCOUNTER — Ambulatory Visit
Admission: RE | Admit: 2017-05-05 | Discharge: 2017-05-05 | Disposition: A | Discharge status: Home / Self Care | Payer: Medicare HMO | Source: Ambulatory Visit | Attending: Radiation Oncology | Admitting: Radiation Oncology

## 2017-05-05 ENCOUNTER — Other Ambulatory Visit: Payer: Self-pay

## 2017-05-05 ENCOUNTER — Encounter: Payer: Self-pay | Admitting: Radiation Oncology

## 2017-05-05 VITALS — BP 151/89 | HR 74 | Temp 97.7°F | Resp 20 | Ht 66.5 in | Wt 223.6 lb

## 2017-05-05 DIAGNOSIS — Z90710 Acquired absence of both cervix and uterus: Secondary | ICD-10-CM | POA: Diagnosis not present

## 2017-05-05 DIAGNOSIS — M1990 Unspecified osteoarthritis, unspecified site: Secondary | ICD-10-CM | POA: Diagnosis not present

## 2017-05-05 DIAGNOSIS — Z79899 Other long term (current) drug therapy: Secondary | ICD-10-CM | POA: Diagnosis not present

## 2017-05-05 DIAGNOSIS — C50211 Malignant neoplasm of upper-inner quadrant of right female breast: Secondary | ICD-10-CM

## 2017-05-05 DIAGNOSIS — Z853 Personal history of malignant neoplasm of breast: Secondary | ICD-10-CM | POA: Diagnosis not present

## 2017-05-05 DIAGNOSIS — Z170 Estrogen receptor positive status [ER+]: Secondary | ICD-10-CM | POA: Insufficient documentation

## 2017-05-05 DIAGNOSIS — Z79890 Hormone replacement therapy (postmenopausal): Secondary | ICD-10-CM | POA: Diagnosis not present

## 2017-05-05 DIAGNOSIS — Z885 Allergy status to narcotic agent status: Secondary | ICD-10-CM | POA: Diagnosis not present

## 2017-05-05 DIAGNOSIS — D0511 Intraductal carcinoma in situ of right breast: Secondary | ICD-10-CM | POA: Insufficient documentation

## 2017-05-05 DIAGNOSIS — Z923 Personal history of irradiation: Secondary | ICD-10-CM | POA: Diagnosis not present

## 2017-05-05 DIAGNOSIS — M549 Dorsalgia, unspecified: Secondary | ICD-10-CM | POA: Diagnosis not present

## 2017-05-05 DIAGNOSIS — Z510 Encounter for antineoplastic radiation therapy: Secondary | ICD-10-CM | POA: Insufficient documentation

## 2017-05-05 DIAGNOSIS — I10 Essential (primary) hypertension: Secondary | ICD-10-CM | POA: Diagnosis not present

## 2017-05-05 DIAGNOSIS — Z9221 Personal history of antineoplastic chemotherapy: Secondary | ICD-10-CM | POA: Diagnosis not present

## 2017-05-05 NOTE — Progress Notes (Signed)
Radiation Oncology         [phone] ________________________________  Name: NATALE BARBA MRN: [id number]  Date: 05/05/2017  DOB: 1945/11/06  Follow-Up Visit Note  CC: Maury Dus, MD  Excell Seltzer, MD  Diagnosis:   ER/PR positive DCIS of the right breast.   Narrative:  [name] is a pleasant 71 y.o. female who was diagnosed with essentially triple negative left breast cancer in 2015. She underwent lumpectomy with sentinel lymph node evaluation followed by adjuvant chemotherapy and adjuvant radiotherapy to the breast under the care of Dr. Lisbeth Renshaw. She has been followed since with Dr. Jana Hakim without recurrence. She underwent annual mammography in October 2017 and was found to have a 10 mm group of calcifications. These were biopsied on 04/16/16 and consistent with usual ductal hyperplasia. She underwent repeat evaluation and biopsy on 03/23/17 of the same location on 02/17/17 which revealed a fibroadenoma. Subsequently she had a right lumpectomy on 04/02/17 which revealed papillary ductal carcinoma in situ, grade 1 measuring 2 mm, and negative margins. She anticipates starting antiestrogen therapy, and comes today to evaluation for discussion of adjuvant radiotherapy.  On review of systems, the patient reports that she is doing well overall. She has noticed some fullness along the site of her surgical rescection. Dr. Excell Seltzer has plans to follow up with this soon. She denies any chest pain, shortness of breath, cough, fevers, chills, night sweats, unintended weight changes. She does have fatigue related to hypothyroidism but is working with her provider on getting her TSH lower. She returns in December to have this rechecked. She denies any bowel or bladder disturbances, and denies abdominal pain, nausea or vomiting. She denies any new musculoskeletal or joint aches or pains, new skin lesions or concerns. She does describe some tightness since her last treatment in the left  axilla/shoulder. A complete review of systems is obtained and is otherwise negative.  Previous Radiotherapy:   08/27/2014-09/21/2014: 42.5 Gy in 17 fractions to the left breast with whole breast tangent fields 7.5  Gy in 3 fraction boost to the seroma   Past Medical History:  Past Medical History:  Diagnosis Date  . Allergy codeine   rapid heart beat, cold sweat  . Arthritis   . Breast cancer ([place]) 01/10/14   Left breast INVASIVE DUCTAL CARCINOMA STAGE 2  . Hypertension   . Hyperthyroidism    medication called in today for hyperthroid  . Personal history of chemotherapy 2015   Left Breast Cancer  . Personal history of radiation therapy 2015   Left Breast Cancer  . PONV (postoperative nausea and vomiting)   . S/P radiation therapy 09/21/14 completed   left breast    Past Surgical History: Past Surgical History:  Procedure Laterality Date  . ABDOMINAL HYSTERECTOMY  1992  . BREAST LUMPECTOMY Left 02/12/2014  . COLONOSCOPY    . FOOT ARTHROPLASTY  1998   right  . KNEE ARTHROSCOPY  2012   right  . LUMBAR LAMINECTOMY  1991    Social History:  Social History   Socioeconomic History  . Marital status: Married    Spouse name: Not on file  . Number of children: Not on file  . Years of education: Not on file  . Highest education level: Not on file  Social Needs  . Financial resource strain: Not on file  . Food insecurity - worry: Not on file  . Food insecurity - inability: Not on file  . Transportation needs - medical: Not on file  .  Transportation needs - non-medical: Not on file  Occupational History  . Not on file  Tobacco Use  . Smoking status: Never Smoker  . Smokeless tobacco: Never Used  Substance and Sexual Activity  . Alcohol use: No  . Drug use: No  . Sexual activity: Yes  Other Topics Concern  . Not on file  Social History Narrative  . Not on file    Family History: Family History  Problem Relation Age of Onset  . Cancer Sister   . Cancer Brother    . Cancer Maternal Aunt   . Cancer Paternal Aunt      ALLERGIES:  is allergic to codeine and adhesive [tape].  Meds: Current Outpatient Medications  Medication Sig Dispense Refill  . amLODipine (NORVASC) 5 MG tablet Take 5 mg by mouth daily.    . Cholecalciferol (VITAMIN D3) 2000 UNITS TABS Take 1 tablet by mouth daily.    . cloNIDine (CATAPRES-TTS-1) 0.1 mg/24hr patch Place 1 patch (0.1 mg total) onto the skin once a week. 12 patch 3  . diclofenac sodium (VOLTAREN) 1 % GEL Apply 2 g topically 4 (four) times daily.    [name] docusate sodium (COLACE) 100 MG capsule Take 100 mg by mouth daily as needed for mild constipation.     . gabapentin (NEURONTIN) 300 MG capsule Take 1 capsule (300 mg total) by mouth at bedtime. 90 capsule 0  . Ginkgo Biloba 120 MG CAPS Take 1 capsule by mouth daily.    [name] levothyroxine (SYNTHROID, LEVOTHROID) 25 MCG tablet Take 25 mcg by mouth daily before breakfast.    . meloxicam (MOBIC) 15 MG tablet Take 15 mg by mouth daily.    . Omega-3 Fatty Acids (FISH OIL CONCENTRATE) 300 MG CAPS Take by mouth.    . oxyCODONE-acetaminophen (PERCOCET/ROXICET) 5-325 MG tablet Take 1 tablet by mouth every 4 (four) hours as needed for severe pain.    . potassium chloride SA (K-DUR,KLOR-CON) 20 MEQ tablet Take 20 mEq by mouth once.    . valsartan-hydrochlorothiazide (DIOVAN-HCT) 320-25 MG per tablet Take 1 tablet by mouth daily.    [name] anastrozole (ARIMIDEX) 1 MG tablet Take 1 tablet (1 mg total) daily by mouth. (Patient not taking: Reported on 05/05/2017) 90 tablet 4   No current facility-administered medications for this encounter.     Physical Findings: Wt Readings from Last 3 Encounters:  05/05/17 223 lb 9.6 oz (101.4 kg)  05/03/17 223 lb 12.8 oz (101.5 kg)  04/02/17 227 lb (103 kg)   Temp Readings from Last 3 Encounters:  05/05/17 97.7 F (36.5 C) (Oral)  05/03/17 98.1 F (36.7 C) (Oral)  04/02/17 97.6 F (36.4 C) (Oral)   BP Readings from Last 3 Encounters:    05/05/17 (!) 151/89  05/03/17 (!) 146/80  04/02/17 (!) 148/80   Pulse Readings from Last 3 Encounters:  05/05/17 74  05/03/17 75  04/02/17 62   Pain Assessment Pain Score: 0-No pain/10 In general this is a well appearing African American female in no acute distress. She is alert and oriented x4 and appropriate throughout the examination. HEENT reveals that the patient is normocephalic, atraumatic. EOMs are intact. PERRLA. Skin is intact without any evidence of gross lesions. Cardiopulmonary assessment is negative for acute distress and she exhibits normal effort. The right breast is intact with a well healed lumpectomy site. No erythema is noted, and there is mild fullness along the upper inner quadrant.     Lab Findings: Lab Results  Component Value Date  WBC 4.0 04/26/2017   HGB 13.0 04/26/2017   HCT 39.8 04/26/2017   MCV 90.3 04/26/2017   PLT 251 04/26/2017     Radiographic Findings: No results found.  Impression/Plan: 1. ER/PR positive DCIS of the right breast.  Dr. Lisbeth Renshaw discusses the pathology findings and reviews the nature of insitu breast disease. The patient has done well surgically, and we reviewed her pathology. The patient's course would include recommendations for external radiotherapy to the breast followed by antiestrogen therapy. We discussed the risks, benefits, short, and long term effects of radiotherapy, and the patient is interested in proceeding. She will meet back with Dr. Excell Seltzer in the next week or so to confirm that there's not a need to aspirate fluid. Dr. Lisbeth Renshaw discusses the delivery and logistics of radiotherapy and anticipates a course of 4 weeks. Written consent is obtained and placed in the chart, a copy was provided to the patient. She will return for simulation on  2. History of Stage IIA, pT2N0 grade 3 tripe negative invasive ductal carcinoma of the left breast. The patient continues to be NED and will continue follow up with Dr. Jana Hakim.   3. Tightness of the left axilla and back pain since her last course of radiation. We will set her up to meet with PT and appreciate their recommendations.   In a visit lasting 45 minutes, greater than 50% of the time was spent face to face discussing the role of treatment, and coordinating the patient's care.   The above documentation reflects my direct findings during this shared patient visit. Please see the separate note by Dr. Lisbeth Renshaw on this date for the remainder of the patient's plan of care.     Carola Rhine, PAC

## 2017-05-12 ENCOUNTER — Ambulatory Visit: Payer: Medicare HMO | Attending: Radiation Oncology | Admitting: Physical Therapy

## 2017-05-12 DIAGNOSIS — M5441 Lumbago with sciatica, right side: Secondary | ICD-10-CM | POA: Insufficient documentation

## 2017-05-12 DIAGNOSIS — M25611 Stiffness of right shoulder, not elsewhere classified: Secondary | ICD-10-CM | POA: Insufficient documentation

## 2017-05-12 DIAGNOSIS — G8929 Other chronic pain: Secondary | ICD-10-CM | POA: Diagnosis present

## 2017-05-12 DIAGNOSIS — M25612 Stiffness of left shoulder, not elsewhere classified: Secondary | ICD-10-CM

## 2017-05-12 DIAGNOSIS — R29898 Other symptoms and signs involving the musculoskeletal system: Secondary | ICD-10-CM

## 2017-05-12 DIAGNOSIS — R2689 Other abnormalities of gait and mobility: Secondary | ICD-10-CM | POA: Diagnosis present

## 2017-05-12 DIAGNOSIS — M5442 Lumbago with sciatica, left side: Secondary | ICD-10-CM | POA: Diagnosis present

## 2017-05-12 NOTE — Therapy (Signed)
[place], [place], [address] Phone: [phone]   Fax:  [phone]  Physical Therapy Evaluation  Patient Details  Name: Monica [name] MRN: [id number] Date of Birth: [birth year] Referring Provider: Shona Simpson, PA-C   Encounter Date: 05/12/2017  PT End of Session - 05/12/17 1201    Visit Number  1    Number of Visits  9    Date for PT Re-Evaluation  06/21/17    PT Start Time  1010    PT Stop Time  1101    PT Time Calculation (min)  51 min    Activity Tolerance  Patient tolerated treatment well    Behavior During Therapy  [facility] for tasks assessed/performed       Past Medical History:  Diagnosis Date  . Allergy codeine   rapid heart beat, cold sweat  . Arthritis   . Breast cancer ([place]) 01/10/14   Left breast INVASIVE DUCTAL CARCINOMA STAGE 2  . Hypertension   . Hyperthyroidism    medication called in today for hyperthroid  . Personal history of chemotherapy 2015   Left Breast Cancer  . Personal history of radiation therapy 2015   Left Breast Cancer  . PONV (postoperative nausea and vomiting)   . S/P radiation therapy 09/21/14 completed   left breast    Past Surgical History:  Procedure Laterality Date  . ABDOMINAL HYSTERECTOMY  1992  . BREAST LUMPECTOMY Left 02/12/2014  . BREAST LUMPECTOMY WITH NEEDLE LOCALIZATION AND AXILLARY SENTINEL LYMPH NODE BX Left 02/12/2014   Procedure: LEFT BREAST LUMPECTOMY WITH NEEDLE LOCALIZATION AND LEFT AXILLARY SENTINEL LYMPH NODE BX;  Surgeon: [name], MD;  Location: [facility];  Service: General;  Laterality: Left;  . BREAST LUMPECTOMY WITH RADIOACTIVE SEED LOCALIZATION Right 04/02/2017   Procedure: BREAST LUMPECTOMY WITH RADIOACTIVE SEED LOCALIZATION;  Surgeon: Excell Seltzer, MD;  Location: [facility];  Service: General;  Laterality: Right;  . COLONOSCOPY    . FOOT ARTHROPLASTY  1998   right  . KNEE  ARTHROSCOPY  2012   right  . LUMBAR LAMINECTOMY  1991  . PORTACATH PLACEMENT Right 02/12/2014   Procedure: INSERTION PORT-A-CATH;  Surgeon: [name], MD;  Location: [facility];  Service: General;  Laterality: Right;    There were no vitals filed for this visit.   Subjective Assessment - 05/12/17 1008    Subjective  "I need double knee replacement.  I've got back issues, knee issue."  "I'm here today because I was explaining to him that when I had radiation last time, the position that they had me in was so awkward I hurt my back. Had back pain flare-up in June this year.  I've had to get epidurals." Reports left foot neuropathy. Knees are bone on bone and also has bone spurs, so that she can't straighten either knee fully.  Hasn't done any PT for her back.    Pertinent History  From Shona Simpson' note:  "Ms. Monica [name] is a pleasant 71 y.o. female who was diagnosed with essentially triple negative left breast cancer in 2015. She underwent lumpectomy 02/12/14 with sentinel lymph node evaluation followed by adjuvant chemotherapy and adjuvant radiotherapy to the breast under the care of Dr. Lisbeth Renshaw." On 04/02/17 she had right lumpectomy for ER/PR positive DCIS; she expects to have 4 weeks of XRT for this. Arthroscopy right knee 11/03/10 for debridement.  Lumbar laminectomy 07/30/89.  Hysterectomy 1992.  Nerve tumor Rt. foot 1998.  Expects to have TKA in 2019.  HTN controlled. Hypothyroid and on meds, which have recently been adjusted due to fatigue.  Pt. says she has been getting epidurals for the last two years, prn, every 3-6 months. Pt. says she had some fluid on right breast and goes back next week to surgeon to see if she needs aspiration.    Patient Stated Goals  get her back feeling better so it won't be aggravated with the next course of radiation    Currently in Pain?  Yes    Pain Score  8     Pain Location  Back    Pain Orientation  Right;Left    Pain Descriptors /  Indicators  Pressure    Pain Type  Chronic pain    Pain Radiating Towards  both lateral legs to distal to knees    Pain Frequency  Constant    Aggravating Factors   going up and down steps, sitting down for awhile--a combination of the back and knees    Pain Relieving Factors  getting up to move around a bit; pain med    Effect of Pain on Daily Activities  can't lie flat without pillow under knees         [facility] PT Assessment - 05/12/17 0001      Assessment   Medical Diagnosis  h/o left breast cancer 2015, right in 2018; referred for back pain    Referring Provider  Shona Simpson, PA-C    Onset Date/Surgical Date  04/02/17 right breast surgery    Hand Dominance  Right    Prior Therapy  none      Precautions   Precautions  Fall;Other (comment)      Restrictions   Weight Bearing Restrictions  No but has very arthritic knees      Balance Screen   Has the patient fallen in the past 6 months  No    Has the patient had a decrease in activity level because of a fear of falling?   No    Is the patient reluctant to leave their home because of a fear of falling?   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 says it is still healing    Quick DASH   38.64      Posture/Postural Control   Posture/Postural Control  Postural limitations    Postural Limitations  Forward head      ROM / Strength   AROM / PROM / Strength  AROM       AROM   AROM Assessment Site  Shoulder    Right/Left Shoulder  Right;Left    Right Shoulder Flexion  150 Degrees sitting    Right Shoulder ABduction  150 Degrees    Right Shoulder Internal Rotation  --  WFL in supine    Right Shoulder External Rotation  50 Degrees    Left Shoulder Flexion  139 Degrees sitting    Left Shoulder ABduction  145 Degrees    Left Shoulder Internal Rotation  -- supine, WFL    Left Shoulder External Rotation  46 Degrees    Lumbar Flexion  reaches toes in standig    Lumbar Extension  25% loss    Lumbar - Right Side Bend  WFL in standing    Lumbar - Left Side Bend  WFL in standing    Lumbar - Right Rotation  25% loss    Lumbar - Left Rotation  25% loss      Special Tests    Special Tests  Lumbar    Lumbar Tests  other      other   Findings  -- reduces pain from 7 to 6/10 with no change in location    Side   -- both    Comments  repeated flexion in standing x 10 reduces LBP from 7 to 6, and doesn't change location      other   Findings  -- pain ~same or reduced frrom 6 to 5.5    Side  -- both, no change in location    Comment  repeated extension in standing      Ambulation/Gait   Ambulation/Gait  Yes    Ambulation/Gait Assistance  6: Modified independent (Device/Increase time)    Gait Comments  slow pace, gets off balance a bit (when turning today)           Quick Dash - 05/12/17 0001    Open a tight or new jar  Mild difficulty    Do heavy household chores (wash walls, wash floors)  Severe difficulty    Carry a shopping bag or briefcase  Severe difficulty    Wash your back  Moderate difficulty    Use a knife to cut food  No difficulty    Recreational activities in which you take some force or impact through your arm, shoulder, or hand (golf, hammering, tennis)  Moderate difficulty    During the past week, to what extent has your arm, shoulder or hand problem interfered with your normal social activities with family, friends, neighbors, or  groups?  Modererately    During the past week, to what extent has your arm, shoulder or hand problem limited your work or other regular daily activities  Modererately    Arm, shoulder, or hand pain.  Moderate    Tingling (pins and needles) in your arm, shoulder, or hand  None    Difficulty Sleeping  No difficulty    DASH Score  38.64 %       Objective measurements completed on examination: See above findings.                      [facility] Goals - 05/12/17 1210      CC Long Term Goal  #1   Title  Patient will be independent in HEP for low back flexibility and strengthening, and core strengthening.    Time  4    Period  Weeks    Status  New      CC Long Term Goal  #2   Title  Patient will report at least 50% decrease in low back pain and pain that radiates from low back.    Time  4    Period  Weeks  Status  New      CC Long Term Goal  #3   Title  Quick DASH score will improve to 25 or less.    Baseline  38.64 at eval    Time  4    Period  Weeks    Status  New          Plan - 05/12/17 1201    Clinical Impression Statement  This is a pleasant woman s/p left breast cancer 3 years ago and right breast cancer this year.  She had XRT in 10-01-2013 and reports back pain ever since lying on the treatment table for that, but seems to have had an exacerbation in June (her reporting is slightly confusing, however).  She has been getting epidurals for that pain. She has some limitation in left shoulder ROM as well as right.  She report her recent right incision is still healing, but it was not viewed by therapist today.  She would like to have her back feeling better before XRT and not worse during the course of XRT that will start soon. Although gait speed wasn't tested today, she does in general move slowly. She did have improvement in her back pain with trunk flexion and perhaps a little with extension as well.    History and Personal Factors relevant to plan of  care:  multiple arthritis-related problems including both knees needing to be replaced; remote h/o lumbar laminectomy    Clinical Presentation  Evolving    Clinical Presentation due to:  still healing from recent right lumpectomy and is to start XRT soon    Clinical Decision Making  Moderate    Rehab Potential  Good    PT Frequency  2x / week    PT Duration  4 weeks    PT Treatment/Interventions  ADLs/Self Care Home Management;Cryotherapy;Electrical Stimulation;Moist Heat;Therapeutic exercise;Balance training;Patient/family education;Manual techniques;Passive range of motion    PT Next Visit Plan  Begin low back flexibility exercises and HEP: gentle stretching into flexion, rotation, and extension, as long as pain is not increased. Progress to core strengthening.  Include shoulder ROM in clinic and HEP.  Modalities prn.    Consulted and Agree with Plan of Care  Patient       Patient will benefit from skilled therapeutic intervention in order to improve the following deficits and impairments:  Pain, Decreased range of motion, Decreased mobility, Decreased strength  Visit Diagnosis: Chronic midline low back pain with bilateral sciatica - Plan: PT plan of care cert/re-cert  Stiffness of left shoulder, not elsewhere classified - Plan: PT plan of care cert/re-cert  Stiffness of right shoulder, not elsewhere classified - Plan: PT plan of care cert/re-cert  Other symptoms and signs involving the musculoskeletal system - Plan: PT plan of care cert/re-cert  Other abnormalities of gait and mobility - Plan: PT plan of care cert/re-cert  G-Codes - 18/29/93 1210-10-02    Functional Assessment Tool Used (Outpatient Only)  quick DASH    Functional Limitation  Carrying, moving and handling objects    Carrying, Moving and Handling Objects Current Status (Z1696)  At least 20 percent but less than 40 percent impaired, limited or restricted    Carrying, Moving and Handling Objects Goal Status (V8938)  At least  20 percent but less than 40 percent impaired, limited or restricted        Problem List Patient Active Problem List   Diagnosis Date Noted  . Malignant neoplasm of upper-inner quadrant of right breast in female,  estrogen receptor positive ([place]) 02/24/2017  . Hot flashes 04/05/2015  . Other pancytopenia ([place]) 06/28/2014  . Corneal abrasion 06/27/2014  . Anemia associated with chemotherapy 06/25/2014  . Thrombocytopenia ([place]) 06/25/2014  . Chemotherapy-induced neuropathy ([place]) 06/04/2014  . Decreased appetite 05/28/2014  . Body aches 05/28/2014  . Dyspepsia 05/28/2014  . Insomnia 05/02/2014  . UTI (urinary tract infection) 04/17/2014  . Febrile neutropenia ([place]) 04/15/2014  . Hypokalemia 04/15/2014  . Hypertension 04/15/2014  . Itchy eyes 04/09/2014  . Heart palpitations 04/04/2014  . Nausea without vomiting 03/26/2014  . Heartburn 03/26/2014  . Thrush 03/26/2014  . Breast cancer of lower-outer quadrant of left female breast ([place]) 01/12/2014    SALISBURY,DONNA 05/12/2017, 12:14 PM  [place], [place], [address] Phone: [phone]   Fax:  [phone]  Name: KAMELA BLANSETT MRN: [id number] Date of Birth: Jun 25, 1945  Serafina Royals, PT 05/12/17 12:14 PM

## 2017-05-20 ENCOUNTER — Ambulatory Visit: Payer: Medicare HMO | Admitting: Physical Therapy

## 2017-05-20 DIAGNOSIS — G8929 Other chronic pain: Secondary | ICD-10-CM

## 2017-05-20 DIAGNOSIS — M5441 Lumbago with sciatica, right side: Principal | ICD-10-CM

## 2017-05-20 DIAGNOSIS — R29898 Other symptoms and signs involving the musculoskeletal system: Secondary | ICD-10-CM

## 2017-05-20 DIAGNOSIS — R2689 Other abnormalities of gait and mobility: Secondary | ICD-10-CM

## 2017-05-20 DIAGNOSIS — M5442 Lumbago with sciatica, left side: Principal | ICD-10-CM

## 2017-05-20 NOTE — Patient Instructions (Addendum)
Supine Knee-to-Chest, Unilateral    Lie on back, hands clasped behind one knee. Pull knee in toward chest until a comfortable stretch is felt in lower back and buttocks. Hold _10__ seconds.  Repeat __5_ times per session. Do _1-2__ sessions per day.  Copyright  VHI. All rights reserved.  Supine With Rotation    Lie, back flat, legs bent, feet together. Rotate knees to one side. Hold _10__ seconds. Repeat to other side. Repeat __5_ times per session. Do _1-2__ sessions per day.  Copyright  VHI. All rights reserved.   Pelvic Tilt    Flatten back by tightening stomach muscles and buttocks.Hold for 5 seconds. Repeat __10__ times per set. Do __1__ sets per session. Do __1-2__ sessions per day.  http://orth.exer.us/135   Copyright  VHI. All rights reserved.    Hamstring: Towel Stretch (Supine)    Lie on back. Loop towel around left foot, hip and knee at 90. Straighten knee and pull foot toward body. Hold __30_ seconds. Relax. Repeat _2__ times. Do _1-2__ times a day. Repeat with other leg.    Copyright  VHI. All rights reserved.   Gluteal Sets    Squeeze pelvic floor and hold. Tighten bottom. Hold for __5_ seconds. Relax for _5__ seconds. Repeat _5-10__ times. Do __1-2_ times a day.   Copyright  VHI. All rights reserved.

## 2017-05-20 NOTE — Therapy (Signed)
[place], [place], [address] Phone: [phone]   Fax:  [phone]  Physical Therapy Treatment  Patient Details  Name: Monica [name] MRN: [id number] Date of Birth: 10-20-1945 Referring Provider: Shona Simpson, PA-C   Encounter Date: 05/20/2017  PT End of Session - 05/20/17 1341    Visit Number  2    Number of Visits  9    Date for PT Re-Evaluation  06/21/17    PT Start Time  1301    PT Stop Time  1342    PT Time Calculation (min)  41 min    Activity Tolerance  Patient tolerated treatment well    Behavior During Therapy  [facility] for tasks assessed/performed       Past Medical History:  Diagnosis Date  . Allergy codeine   rapid heart beat, cold sweat  . Arthritis   . Breast cancer ([place]) 01/10/14   Left breast INVASIVE DUCTAL CARCINOMA STAGE 2  . Hypertension   . Hyperthyroidism    medication called in today for hyperthroid  . Personal history of chemotherapy 2015   Left Breast Cancer  . Personal history of radiation therapy 2015   Left Breast Cancer  . PONV (postoperative nausea and vomiting)   . S/P radiation therapy 09/21/14 completed   left breast    Past Surgical History:  Procedure Laterality Date  . ABDOMINAL HYSTERECTOMY  1992  . BREAST LUMPECTOMY Left 02/12/2014  . BREAST LUMPECTOMY WITH NEEDLE LOCALIZATION AND AXILLARY SENTINEL LYMPH NODE BX Left 02/12/2014   Procedure: LEFT BREAST LUMPECTOMY WITH NEEDLE LOCALIZATION AND LEFT AXILLARY SENTINEL LYMPH NODE BX;  Surgeon: [name], MD;  Location: [facility];  Service: General;  Laterality: Left;  . BREAST LUMPECTOMY WITH RADIOACTIVE SEED LOCALIZATION Right 04/02/2017   Procedure: BREAST LUMPECTOMY WITH RADIOACTIVE SEED LOCALIZATION;  Surgeon: Excell Seltzer, MD;  Location: [facility];  Service: General;  Laterality: Right;  . COLONOSCOPY    . FOOT ARTHROPLASTY  1998   right  . KNEE  ARTHROSCOPY  2012   right  . LUMBAR LAMINECTOMY  1991  . PORTACATH PLACEMENT Right 02/12/2014   Procedure: INSERTION PORT-A-CATH;  Surgeon: [name], MD;  Location: [facility];  Service: General;  Laterality: Right;    There were no vitals filed for this visit.  Subjective Assessment - 05/20/17 1303    Subjective  Still moving slowly.  The knees are acting up today. Went yesterday tothe surgeon and had fluid drawn off the breast.    Pertinent History  From Shona Simpson' note:  "Ms. Monica [name] is a pleasant 71 y.o. female who was diagnosed with essentially triple negative left breast cancer in 2015. She underwent lumpectomy 02/12/14 with sentinel lymph node evaluation followed by adjuvant chemotherapy and adjuvant radiotherapy to the breast under the care of Dr. Lisbeth Renshaw." On 04/02/17 she had right lumpectomy for ER/PR positive DCIS; she expects to have 4 weeks of XRT for this. Arthroscopy right knee 11/03/10 for debridement.  Lumbar laminectomy 07/30/89.  Hysterectomy 1992.  Nerve tumor Rt. foot 1998.  Expects to have TKA in 2019.  HTN controlled. Hypothyroid and on meds, which have recently been adjusted due to fatigue.  Pt. says she has been getting epidurals for the last two years, prn, every 3-6 months. Pt. says she had some fluid on right breast and goes back next week to surgeon to see if she needs aspiration.    Currently in Pain?  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    Pain Relieving Factors  pain pill helps                      [facility] Adult PT Treatment/Exercise - 05/20/17 0001      Lumbar Exercises: Stretches   Passive Hamstring Stretch  1 rep;30 seconds 2 reps each leg, using gait belt as strap    Single  Knee to Chest Stretch  5 reps;10 seconds each side    Lower Trunk Rotation  5 reps;10 seconds each way      Lumbar Exercises: Supine   Ab Set  5 seconds;10 reps    Glut Set  5 seconds;10 reps    Clam  5 reps;1 second this one not added to HEP    Bent Knee Raise  5 reps;1 second with pelvic tilt held while marching once with each foot    Bridge Limitations  tried this, but painful to her knees, so stopped after one rep             PT Education - 05/20/17 1340    Education provided  Yes    Education Details  pelvic tilt, lower trunk rotation, hamstring stretches, single knee to chest stretches, gluteal sets    Person(s) Educated  Patient    Methods  Explanation;Verbal cues;Handout    Comprehension  Verbalized understanding;Returned demonstration             [facility] Goals - 05/12/17 1210      CC Long Term Goal  #1   Title  Patient will be independent in HEP for low back flexibility and strengthening, and core strengthening.    Time  4    Period  Weeks    Status  New      CC Long Term Goal  #2   Title  Patient will report at least 50% decrease in low back pain and pain that radiates from low back.    Time  4    Period  Weeks    Status  New      CC Long Term Goal  #3   Title  Quick DASH score will improve to 25 or less.    Baseline  38.64 at eval    Time  4    Period  Weeks    Status  New         Plan - 05/20/17 1342    Clinical Impression Statement  Pt. did well with initial exercise instruction, catching on quickly and tolerating low back exercises well.  She was asked to monitor how she feels after that to be able to report when she returns, which is scheduled to be tomorrow morning.    PT Frequency  2x / week    PT Duration  4 weeks    PT Treatment/Interventions  ADLs/Self Care Home Management;Cryotherapy;Electrical Stimulation;Moist Heat;Therapeutic exercise;Balance training;Patient/family education;Manual techniques;Passive range of motion     PT Next Visit Plan  Review initial low back flexibility exercises and HEP: gentle stretching into flexion, rotation, and extension, as long as pain is not increased. Progress to more core strengthening.  Include  shoulder ROM in clinic and HEP.  Modalities prn.    Consulted and Agree with Plan of Care  Patient       Patient will benefit from skilled therapeutic intervention in order to improve the following deficits and impairments:  Pain, Decreased range of motion, Decreased mobility, Decreased strength  Visit Diagnosis: Chronic midline low back pain with bilateral sciatica  Other symptoms and signs involving the musculoskeletal system  Other abnormalities of gait and mobility     Problem List Patient Active Problem List   Diagnosis Date Noted  . Malignant neoplasm of upper-inner quadrant of right breast in female, estrogen receptor positive ([employer]) 02/24/2017  . Hot flashes 04/05/2015  . Other pancytopenia ([place]) 06/28/2014  . Corneal abrasion 06/27/2014  . Anemia associated with chemotherapy 06/25/2014  . Thrombocytopenia ([place]) 06/25/2014  . Chemotherapy-induced neuropathy ([place]) 06/04/2014  . Decreased appetite 05/28/2014  . Body aches 05/28/2014  . Dyspepsia 05/28/2014  . Insomnia 05/02/2014  . UTI (urinary tract infection) 04/17/2014  . Febrile neutropenia ([place]) 04/15/2014  . Hypokalemia 04/15/2014  . Hypertension 04/15/2014  . Itchy eyes 04/09/2014  . Heart palpitations 04/04/2014  . Nausea without vomiting 03/26/2014  . Heartburn 03/26/2014  . Thrush 03/26/2014  . Breast cancer of lower-outer quadrant of left female breast ([place]) 01/12/2014    SALISBURY,DONNA 05/20/2017, 1:44 PM  [place], [place], [address] Phone: [phone]   Fax:  [phone]  Name: MALIA CORSI MRN: [id number] Date of Birth: Nov 12, 1945  Serafina Royals, PT 05/20/17 1:44 PM

## 2017-05-21 ENCOUNTER — Ambulatory Visit: Payer: Medicare HMO | Admitting: Physical Therapy

## 2017-05-21 ENCOUNTER — Encounter: Payer: Self-pay | Admitting: Physical Therapy

## 2017-05-21 DIAGNOSIS — M25612 Stiffness of left shoulder, not elsewhere classified: Secondary | ICD-10-CM

## 2017-05-21 DIAGNOSIS — R29898 Other symptoms and signs involving the musculoskeletal system: Secondary | ICD-10-CM

## 2017-05-21 DIAGNOSIS — M5441 Lumbago with sciatica, right side: Principal | ICD-10-CM

## 2017-05-21 DIAGNOSIS — M5442 Lumbago with sciatica, left side: Principal | ICD-10-CM

## 2017-05-21 DIAGNOSIS — M25611 Stiffness of right shoulder, not elsewhere classified: Secondary | ICD-10-CM

## 2017-05-21 DIAGNOSIS — R2689 Other abnormalities of gait and mobility: Secondary | ICD-10-CM

## 2017-05-21 DIAGNOSIS — G8929 Other chronic pain: Secondary | ICD-10-CM

## 2017-05-21 NOTE — Therapy (Signed)
[place], [place], [address] Phone: [phone]   Fax:  [phone]  Physical Therapy Treatment  Patient Details  Name: JENAVEE LAGUARDIA MRN: [id number] Date of Birth: 28-Nov-1945 Referring Provider: Shona Simpson, PA-C   Encounter Date: 05/21/2017  PT End of Session - 05/21/17 0815    Visit Number  3    Number of Visits  9    Date for PT Re-Evaluation  06/21/17    PT Start Time  0808    PT Stop Time  0849    PT Time Calculation (min)  41 min    Activity Tolerance  Patient tolerated treatment well    Behavior During Therapy  [facility] for tasks assessed/performed       Past Medical History:  Diagnosis Date  . Allergy codeine   rapid heart beat, cold sweat  . Arthritis   . Breast cancer ([place]) 01/10/14   Left breast INVASIVE DUCTAL CARCINOMA STAGE 2  . Hypertension   . Hyperthyroidism    medication called in today for hyperthroid  . Personal history of chemotherapy 2015   Left Breast Cancer  . Personal history of radiation therapy 2015   Left Breast Cancer  . PONV (postoperative nausea and vomiting)   . S/P radiation therapy 09/21/14 completed   left breast    Past Surgical History:  Procedure Laterality Date  . ABDOMINAL HYSTERECTOMY  1992  . BREAST LUMPECTOMY Left 02/12/2014  . BREAST LUMPECTOMY WITH NEEDLE LOCALIZATION AND AXILLARY SENTINEL LYMPH NODE BX Left 02/12/2014   Procedure: LEFT BREAST LUMPECTOMY WITH NEEDLE LOCALIZATION AND LEFT AXILLARY SENTINEL LYMPH NODE BX;  Surgeon: [name], MD;  Location: [place];  Service: General;  Laterality: Left;  . BREAST LUMPECTOMY WITH RADIOACTIVE SEED LOCALIZATION Right 04/02/2017   Procedure: BREAST LUMPECTOMY WITH RADIOACTIVE SEED LOCALIZATION;  Surgeon: Excell Seltzer, MD;  Location: [place];  Service: General;  Laterality: Right;  . COLONOSCOPY    . FOOT ARTHROPLASTY  1998   right  . KNEE  ARTHROSCOPY  2012   right  . LUMBAR LAMINECTOMY  1991  . PORTACATH PLACEMENT Right 02/12/2014   Procedure: INSERTION PORT-A-CATH;  Surgeon: [name], MD;  Location: [place];  Service: General;  Laterality: Right;    There were no vitals filed for this visit.  Subjective Assessment - 05/21/17 0808    Subjective  Last session went really well. I took a pain pill so I didn't have any complications or anything. I did not take a pain pill this morning.     Pertinent History  From Shona Simpson' note:  "[name] is a pleasant 71 y.o. female who was diagnosed with essentially triple negative left breast cancer in 2015. She underwent lumpectomy 02/12/14 with sentinel lymph node evaluation followed by adjuvant chemotherapy and adjuvant radiotherapy to the breast under the care of Dr. Lisbeth Renshaw." On 04/02/17 she had right lumpectomy for ER/PR positive DCIS; she expects to have 4 weeks of XRT for this. Arthroscopy right knee 11/03/10 for debridement.  Lumbar laminectomy 07/30/89.  Hysterectomy 1992.  Nerve tumor Rt. foot 1998.  Expects to have TKA in 2019.  HTN controlled. Hypothyroid and on meds, which have recently been adjusted due to fatigue.  Pt. says she has been getting epidurals for the last two years, prn, every 3-6 months. Pt. says she had some fluid on right breast and goes back next week to surgeon to see if she needs  aspiration.    Patient Stated Goals  get her back feeling better so it won't be aggravated with the next course of radiation    Currently in Pain?  Yes    Pain Score  6     Pain Location  Back    Pain Orientation  Right;Left;Lower    Pain Descriptors / Indicators  Constant;Aching;Nagging    Pain Type  Chronic pain    Pain Onset  More than a month ago    Pain Frequency  Constant                      [facility] Adult PT Treatment/Exercise - 05/21/17 0001      Lumbar Exercises: Stretches   Passive Hamstring Stretch  1 rep;30 seconds 2 reps each  leg, using gait belt as strap    Single Knee to Chest Stretch  5 reps;10 seconds each side    Lower Trunk Rotation  5 reps;10 seconds each way      Lumbar Exercises: Supine   Ab Set  5 seconds;10 reps    Glut Set  10 reps held for 10 sec each    Other Supine Lumbar Exercises  ab set with clam x 10 reps, with marching x 5 reps, x 5 with SLR off bolster      Manual Therapy   Manual Therapy  Passive ROM    Passive ROM  to bilateral shoulders briefly in to abduction             PT Education - 05/20/17 1340    Education provided  Yes    Education Details  pelvic tilt, lower trunk rotation, hamstring stretches, single knee to chest stretches, gluteal sets    Person(s) Educated  Patient    Methods  Explanation;Verbal cues;Handout    Comprehension  Verbalized understanding;Returned demonstration             [facility] Goals - 05/12/17 1210      CC Long Term Goal  #1   Title  Patient will be independent in HEP for low back flexibility and strengthening, and core strengthening.    Time  4    Period  Weeks    Status  New      CC Long Term Goal  #2   Title  Patient will report at least 50% decrease in low back pain and pain that radiates from low back.    Time  4    Period  Weeks    Status  New      CC Long Term Goal  #3   Title  Quick DASH score will improve to 25 or less.    Baseline  38.64 at eval    Time  4    Period  Weeks    Status  New         Plan - 05/21/17 1208    Clinical Impression Statement  Pt feels that therapy is helping and she had decreased back pain after last session. Continued with exercises given at last session and added a progression of dynamic movement with core stabilization. Pt did well with this. Ended with brief PROM to bilateral shoulders.     Rehab Potential  Good    PT Frequency  2x / week    PT Duration  4 weeks    PT Treatment/Interventions  ADLs/Self Care Home Management;Cryotherapy;Electrical Stimulation;Moist  Heat;Therapeutic exercise;Balance training;Patient/family education;Manual techniques;Passive range of motion    PT Next Visit  Plan  Review initial low back flexibility exercises and HEP: gentle stretching into flexion, rotation, and extension, as long as pain is not increased. Progress to more core strengthening.  Include shoulder ROM in clinic and HEP.  Modalities prn.    Consulted and Agree with Plan of Care  Patient       Patient will benefit from skilled therapeutic intervention in order to improve the following deficits and impairments:  Pain, Decreased range of motion, Decreased mobility, Decreased strength  Visit Diagnosis: Chronic midline low back pain with bilateral sciatica  Other symptoms and signs involving the musculoskeletal system  Other abnormalities of gait and mobility  Stiffness of left shoulder, not elsewhere classified  Stiffness of right shoulder, not elsewhere classified     Problem List Patient Active Problem List   Diagnosis Date Noted  . Malignant neoplasm of upper-inner quadrant of right breast in female, estrogen receptor positive ([place]) 02/24/2017  . Hot flashes 04/05/2015  . Other pancytopenia ([place]) 06/28/2014  . Corneal abrasion 06/27/2014  . Anemia associated with chemotherapy 06/25/2014  . Thrombocytopenia ([place]) 06/25/2014  . Chemotherapy-induced neuropathy ([place]) 06/04/2014  . Decreased appetite 05/28/2014  . Body aches 05/28/2014  . Dyspepsia 05/28/2014  . Insomnia 05/02/2014  . UTI (urinary tract infection) 04/17/2014  . Febrile neutropenia ([place]) 04/15/2014  . Hypokalemia 04/15/2014  . Hypertension 04/15/2014  . Itchy eyes 04/09/2014  . Heart palpitations 04/04/2014  . Nausea without vomiting 03/26/2014  . Heartburn 03/26/2014  . Thrush 03/26/2014  . Breast cancer of lower-outer quadrant of left female breast ([place]) 01/12/2014    Allyson Sabal [facility] 05/21/2017, 12:10 PM  [place], [place], [address] Phone: [phone]   Fax:  [phone]  Name: ROBENA EWY MRN: [id number] Date of Birth: [birth year]  Manus Gunning, PT 05/21/17 12:10 PM

## 2017-05-25 ENCOUNTER — Ambulatory Visit
Admission: RE | Admit: 2017-05-25 | Discharge: 2017-05-25 | Disposition: A | Discharge status: Home / Self Care | Payer: Medicare HMO | Source: Ambulatory Visit | Attending: Radiation Oncology | Admitting: Radiation Oncology

## 2017-05-25 DIAGNOSIS — C50512 Malignant neoplasm of lower-outer quadrant of left female breast: Secondary | ICD-10-CM | POA: Diagnosis not present

## 2017-05-25 DIAGNOSIS — M170 Bilateral primary osteoarthritis of knee: Secondary | ICD-10-CM | POA: Diagnosis not present

## 2017-05-25 DIAGNOSIS — D0511 Intraductal carcinoma in situ of right breast: Secondary | ICD-10-CM | POA: Diagnosis not present

## 2017-05-25 DIAGNOSIS — Z510 Encounter for antineoplastic radiation therapy: Secondary | ICD-10-CM | POA: Diagnosis not present

## 2017-05-25 DIAGNOSIS — E039 Hypothyroidism, unspecified: Secondary | ICD-10-CM | POA: Diagnosis not present

## 2017-05-25 DIAGNOSIS — Z170 Estrogen receptor positive status [ER+]: Secondary | ICD-10-CM

## 2017-05-25 DIAGNOSIS — C50211 Malignant neoplasm of upper-inner quadrant of right female breast: Secondary | ICD-10-CM

## 2017-05-25 DIAGNOSIS — M25561 Pain in right knee: Secondary | ICD-10-CM | POA: Diagnosis not present

## 2017-05-25 DIAGNOSIS — M25562 Pain in left knee: Secondary | ICD-10-CM | POA: Diagnosis not present

## 2017-05-25 NOTE — Progress Notes (Signed)
  Radiation Oncology         [phone] ________________________________  Name: Monica [name] MRN: [id number]  Date: 05/25/2017  DOB: [birth year]  Optical Surface Tracking Plan:  Since intensity modulated radiotherapy (IMRT) and 3D conformal radiation treatment methods are predicated on accurate and precise positioning for treatment, intrafraction motion monitoring is medically necessary to ensure accurate and safe treatment delivery.  The ability to quantify intrafraction motion without excessive ionizing radiation dose can only be performed with optical surface tracking. Accordingly, surface imaging offers the opportunity to obtain 3D measurements of patient position throughout IMRT and 3D treatments without excessive radiation exposure.  I am ordering optical surface tracking for this patient's upcoming course of radiotherapy. ________________________________  Kyung Rudd, MD 05/25/2017 10:45 AM    Reference:   Particia Jasper, et al. Surface imaging-based analysis of intrafraction motion for breast radiotherapy patients.Journal of [place], n. 6, nov. 2014. ISSN [id number].   Available at: <http://www.jacmp.org/index.php/jacmp/article/view/4957>.

## 2017-05-25 NOTE — Progress Notes (Signed)
  Radiation Oncology         [phone] ________________________________  Name: SHARISSE RANTZ MRN: [id number]  Date: 05/25/2017  DOB: Jan 16, 1946   DIAGNOSIS:     ICD-10-CM   1. Malignant neoplasm of upper-inner quadrant of right breast in female, estrogen receptor positive ([place]) C50.211    Z17.0     SIMULATION AND TREATMENT PLANNING NOTE  The patient presented for simulation prior to beginning her course of radiation treatment for her diagnosis of right-sided breast cancer. The patient was placed in a supine position on a breast board. A customized vac-lock bag was constructed and this complex treatment device will be used on a daily basis during her treatment. In this fashion, a CT scan was obtained through the chest area and an isocenter was placed near the chest wall within the breast.  The patient will be planned to receive a course of radiation initially to a dose of 42.5 Gy. This will consist of a whole breast radiotherapy technique. To accomplish this, 2 customized blocks have been designed which will correspond to medial and lateral whole breast tangent fields. This treatment will be accomplished at 2.5 Gy per fraction. A forward planning technique will also be evaluated to determine if this approach improves the plan. It is anticipated that the patient will then receive a 7.5 Gy boost to the seroma cavity which has been contoured. This will be accomplished at 2.5 Gy per fraction.   This initial treatment will consist of a 3-D conformal technique. The seroma has been contoured as the primary target structure. Additionally, dose volume histograms of both this target as well as the lungs and heart will also be evaluated. Such an approach is necessary to ensure that the target area is adequately covered while the nearby critical  normal structures are adequately spared.  Plan:  The final anticipated total dose therefore will correspond to 50  Gy.    _______________________________   Jodelle Gross, MD, PhD

## 2017-05-26 ENCOUNTER — Encounter: Payer: Medicare HMO | Admitting: Physical Therapy

## 2017-05-26 ENCOUNTER — Telehealth: Payer: Self-pay | Admitting: *Deleted

## 2017-05-26 NOTE — Telephone Encounter (Signed)
Returned vm  That patient had left with triage line, she is asking if she can get her pain shot in her lower back  ,not sure it would interfere with her radiation treatment to her right breast , she is to start radiation on 06/01/17, she said she needs to call and make the appt  For the pain shot(she thinks epidura ), it shouldn't interfere with radiation to her breast , paient thanked [place] for calling back so soon 3:58 PM

## 2017-05-27 DIAGNOSIS — C50512 Malignant neoplasm of lower-outer quadrant of left female breast: Secondary | ICD-10-CM | POA: Diagnosis not present

## 2017-05-27 DIAGNOSIS — Z510 Encounter for antineoplastic radiation therapy: Secondary | ICD-10-CM | POA: Diagnosis not present

## 2017-05-27 DIAGNOSIS — D0511 Intraductal carcinoma in situ of right breast: Secondary | ICD-10-CM | POA: Diagnosis not present

## 2017-05-28 ENCOUNTER — Ambulatory Visit: Payer: Medicare HMO | Attending: Radiation Oncology | Admitting: Physical Therapy

## 2017-05-28 DIAGNOSIS — M5441 Lumbago with sciatica, right side: Secondary | ICD-10-CM | POA: Insufficient documentation

## 2017-05-28 DIAGNOSIS — R29898 Other symptoms and signs involving the musculoskeletal system: Secondary | ICD-10-CM

## 2017-05-28 DIAGNOSIS — R2689 Other abnormalities of gait and mobility: Secondary | ICD-10-CM | POA: Diagnosis present

## 2017-05-28 DIAGNOSIS — M25611 Stiffness of right shoulder, not elsewhere classified: Secondary | ICD-10-CM | POA: Insufficient documentation

## 2017-05-28 DIAGNOSIS — G8929 Other chronic pain: Secondary | ICD-10-CM | POA: Diagnosis present

## 2017-05-28 DIAGNOSIS — M5442 Lumbago with sciatica, left side: Secondary | ICD-10-CM | POA: Diagnosis present

## 2017-05-28 DIAGNOSIS — M25612 Stiffness of left shoulder, not elsewhere classified: Secondary | ICD-10-CM | POA: Insufficient documentation

## 2017-05-28 NOTE — Therapy (Signed)
[place], [place], [address] Phone: [phone]   Fax:  [phone]  Physical Therapy Treatment  Patient Details  Name: Monica [name] MRN: [id number] Date of Birth: Jan 30, 1946 Referring Provider: Shona Simpson, PA-C   Encounter Date: 05/28/2017  PT End of Session - 05/28/17 1028    Visit Number  4    Number of Visits  9    Date for PT Re-Evaluation  06/21/17    PT Start Time  0934    PT Stop Time  1017    PT Time Calculation (min)  43 min    Activity Tolerance  Patient tolerated treatment well    Behavior During Therapy  [facility] for tasks assessed/performed       Past Medical History:  Diagnosis Date  . Allergy codeine   rapid heart beat, cold sweat  . Arthritis   . Breast cancer ([place]) 01/10/14   Left breast INVASIVE DUCTAL CARCINOMA STAGE 2  . Hypertension   . Hyperthyroidism    medication called in today for hyperthroid  . Personal history of chemotherapy 2015   Left Breast Cancer  . Personal history of radiation therapy 2015   Left Breast Cancer  . PONV (postoperative nausea and vomiting)   . S/P radiation therapy 09/21/14 completed   left breast    Past Surgical History:  Procedure Laterality Date  . ABDOMINAL HYSTERECTOMY  1992  . BREAST LUMPECTOMY Left 02/12/2014  . BREAST LUMPECTOMY WITH NEEDLE LOCALIZATION AND AXILLARY SENTINEL LYMPH NODE BX Left 02/12/2014   Procedure: LEFT BREAST LUMPECTOMY WITH NEEDLE LOCALIZATION AND LEFT AXILLARY SENTINEL LYMPH NODE BX;  Surgeon: [name], MD;  Location: [place];  Service: General;  Laterality: Left;  . BREAST LUMPECTOMY WITH RADIOACTIVE SEED LOCALIZATION Right 04/02/2017   Procedure: BREAST LUMPECTOMY WITH RADIOACTIVE SEED LOCALIZATION;  Surgeon: Excell Seltzer, MD;  Location: [place];  Service: General;  Laterality: Right;  . COLONOSCOPY    . FOOT ARTHROPLASTY  1998   right  . KNEE  ARTHROSCOPY  2012   right  . LUMBAR LAMINECTOMY  1991  . PORTACATH PLACEMENT Right 02/12/2014   Procedure: INSERTION PORT-A-CATH;  Surgeon: [name], MD;  Location: [place];  Service: General;  Laterality: Right;    There were no vitals filed for this visit.  Subjective Assessment - 05/28/17 0936    Subjective  "I got shots in both knees on Tuesday.  They feel better!  I had to cancel Wednesday because I was supposed to stay off it for two days." Plans to check into getting the epidural for her back. Doing pretty well with HEP; feels better when she does these. Is to start radiation on Tuesday.    Pertinent History  From Shona Simpson' note:  "Monica [name] is a pleasant 71 y.o. female who was diagnosed with essentially triple negative left breast cancer in 2015. She underwent lumpectomy 02/12/14 with sentinel lymph node evaluation followed by adjuvant chemotherapy and adjuvant radiotherapy to the breast under the care of Dr. Lisbeth Renshaw." On 04/02/17 she had right lumpectomy for ER/PR positive DCIS; she expects to have 4 weeks of XRT for this. Arthroscopy right knee 11/03/10 for debridement.  Lumbar laminectomy 07/30/89.  Hysterectomy 1992.  Nerve tumor Rt. foot 1998.  Expects to have TKA in 2019.  HTN controlled. Hypothyroid and on meds, which have recently been adjusted due to fatigue.  Pt. says she has been getting epidurals for the  last two years, prn, every 3-6 months. Pt. says she had some fluid on right breast and goes back next week to surgeon to see if she needs aspiration.    Currently in Pain?  Yes    Pain Score  7  not having had pain meds    Pain Location  Back    Pain Orientation  Lower;Right;Left    Pain Relieving Factors  pain meds, voltaren                      [facility] Adult PT Treatment/Exercise - 05/28/17 0001      Lumbar Exercises: Standing   Other Standing Lumbar Exercises  ab set, then walk while keeping ab set      Lumbar Exercises:  Supine   Clam  10 reps;1 second    Bent Knee Raise  1 second;10 reps      Manual Therapy   Manual Therapy  Myofascial release    Myofascial Release  pulling of right UE    Passive ROM  to right shoulder into er, abduction, flexion, and radiation position; to left shoulder into er, abduction, and flexion                     [facility] Clinic Goals - 05/28/17 0940      CC Long Term Goal  #1   Title  Patient will be independent in HEP for low back flexibility and strengthening, and core strengthening.    Status  Partially Met      CC Long Term Goal  #2   Title  Patient will report at least 50% decrease in low back pain and pain that radiates from low back.    Baseline  20-30% bette as of 05/28/17    Status  Partially Met      CC Long Term Goal  #3   Title  Quick DASH score will improve to 25 or less.         Plan - 05/28/17 1028    Clinical Impression Statement  Pt. with soreness in right shoulder area after simulation for radiation to right breast and keeping arm up overhead for a length of time.  Included stretching of both shoulders today.  She has had improvement in her back pain, though not as much as we would like and expect to see.  Goals have been partially met to date. She got injections in both knees earlier this week, which helped that problem a lot.    Rehab Potential  Good    PT Frequency  2x / week    PT Duration  4 weeks    PT Treatment/Interventions  ADLs/Self Care Home Management;Cryotherapy;Electrical Stimulation;Moist Heat;Therapeutic exercise;Balance training;Patient/family education;Manual techniques;Passive range of motion    PT Next Visit Plan  Add core strengthening to her HEP; continue to progress with low back stretching and strengthening, but also include shoulder stretching--could do pulleys, ball up wall.    Consulted and Agree with Plan of Care  Patient       Patient will benefit from skilled therapeutic intervention in order to improve  the following deficits and impairments:  Pain, Decreased range of motion, Decreased mobility, Decreased strength  Visit Diagnosis: Chronic midline low back pain with bilateral sciatica  Other symptoms and signs involving the musculoskeletal system  Other abnormalities of gait and mobility  Stiffness of left shoulder, not elsewhere classified  Stiffness of right shoulder, not elsewhere classified     Problem  List Patient Active Problem List   Diagnosis Date Noted  . Malignant neoplasm of upper-inner quadrant of right breast in female, estrogen receptor positive ([place]) 02/24/2017  . Hot flashes 04/05/2015  . Other pancytopenia ([place]) 06/28/2014  . Corneal abrasion 06/27/2014  . Anemia associated with chemotherapy 06/25/2014  . Thrombocytopenia ([place]) 06/25/2014  . Chemotherapy-induced neuropathy ([place]) 06/04/2014  . Decreased appetite 05/28/2014  . Body aches 05/28/2014  . Dyspepsia 05/28/2014  . Insomnia 05/02/2014  . UTI (urinary tract infection) 04/17/2014  . Febrile neutropenia ([place]) 04/15/2014  . Hypokalemia 04/15/2014  . Hypertension 04/15/2014  . Itchy eyes 04/09/2014  . Heart palpitations 04/04/2014  . Nausea without vomiting 03/26/2014  . Heartburn 03/26/2014  . Thrush 03/26/2014  . Breast cancer of lower-outer quadrant of left female breast ([place]) 01/12/2014    SALISBURY,DONNA 05/28/2017, 10:33 AM  [place], [place], [address] Phone: [phone]   Fax:  [phone]  Name: TYNLEE BAYLE MRN: [id number] Date of Birth: [birth year]  Serafina Royals, PT 05/28/17 10:33 AM

## 2017-06-01 ENCOUNTER — Ambulatory Visit
Admission: RE | Admit: 2017-06-01 | Discharge: 2017-06-01 | Disposition: A | Discharge status: Home / Self Care | Payer: Medicare HMO | Source: Ambulatory Visit | Attending: Radiation Oncology | Admitting: Radiation Oncology

## 2017-06-01 DIAGNOSIS — C50512 Malignant neoplasm of lower-outer quadrant of left female breast: Secondary | ICD-10-CM | POA: Diagnosis not present

## 2017-06-01 DIAGNOSIS — Z510 Encounter for antineoplastic radiation therapy: Secondary | ICD-10-CM | POA: Diagnosis not present

## 2017-06-01 DIAGNOSIS — D0511 Intraductal carcinoma in situ of right breast: Secondary | ICD-10-CM | POA: Diagnosis not present

## 2017-06-02 ENCOUNTER — Ambulatory Visit
Admission: RE | Admit: 2017-06-02 | Discharge: 2017-06-02 | Disposition: A | Discharge status: Home / Self Care | Payer: Medicare HMO | Source: Ambulatory Visit | Attending: Radiation Oncology | Admitting: Radiation Oncology

## 2017-06-02 DIAGNOSIS — Z510 Encounter for antineoplastic radiation therapy: Secondary | ICD-10-CM | POA: Diagnosis not present

## 2017-06-02 DIAGNOSIS — C50512 Malignant neoplasm of lower-outer quadrant of left female breast: Secondary | ICD-10-CM | POA: Diagnosis not present

## 2017-06-02 DIAGNOSIS — Z170 Estrogen receptor positive status [ER+]: Secondary | ICD-10-CM

## 2017-06-02 DIAGNOSIS — D0511 Intraductal carcinoma in situ of right breast: Secondary | ICD-10-CM | POA: Diagnosis not present

## 2017-06-02 DIAGNOSIS — C50211 Malignant neoplasm of upper-inner quadrant of right female breast: Secondary | ICD-10-CM

## 2017-06-02 MED ORDER — ALRA NON-METALLIC DEODORANT (RAD-ONC)
1.0000 | Freq: Once | TOPICAL | Status: AC
Start: 2017-06-02 — End: 2017-06-02
  Administered 2017-06-02: 1 via TOPICAL

## 2017-06-02 MED ORDER — RADIAPLEXRX EX GEL
Freq: Once | CUTANEOUS | Status: AC
Start: 2017-06-02 — End: 2017-06-02
  Administered 2017-06-02: 13:00:00 via TOPICAL

## 2017-06-02 NOTE — Progress Notes (Signed)
Patient education done, my business card, Alra deodorant  and radiaplex given, discussed side effects, skin irritation,pain, swelling, of breast, fatigue, apply radiaplex after rad tx and bedtime daily for moisturizing and soothing skin, alra  after rad tx and prn, luke warm shower/bath,no rubbing,scrubbing, or scratching treated area, unscented soap such as dove,mild, pat dry, no under wire bra if possible, electric shaver only for axilla increase protein in diet, stay hydrated,drink plenty water, sees MD weekly and prn, teach back given

## 2017-06-03 ENCOUNTER — Ambulatory Visit
Admission: RE | Admit: 2017-06-03 | Discharge: 2017-06-03 | Disposition: A | Discharge status: Home / Self Care | Payer: Medicare HMO | Source: Ambulatory Visit | Attending: Radiation Oncology | Admitting: Radiation Oncology

## 2017-06-03 DIAGNOSIS — C50512 Malignant neoplasm of lower-outer quadrant of left female breast: Secondary | ICD-10-CM | POA: Diagnosis not present

## 2017-06-03 DIAGNOSIS — Z510 Encounter for antineoplastic radiation therapy: Secondary | ICD-10-CM | POA: Diagnosis not present

## 2017-06-03 DIAGNOSIS — D0511 Intraductal carcinoma in situ of right breast: Secondary | ICD-10-CM | POA: Diagnosis not present

## 2017-06-04 ENCOUNTER — Ambulatory Visit
Admission: RE | Admit: 2017-06-04 | Discharge: 2017-06-04 | Disposition: A | Discharge status: Home / Self Care | Payer: Medicare HMO | Source: Ambulatory Visit | Attending: Radiation Oncology | Admitting: Radiation Oncology

## 2017-06-04 DIAGNOSIS — D0511 Intraductal carcinoma in situ of right breast: Secondary | ICD-10-CM | POA: Diagnosis not present

## 2017-06-04 DIAGNOSIS — C50512 Malignant neoplasm of lower-outer quadrant of left female breast: Secondary | ICD-10-CM | POA: Diagnosis not present

## 2017-06-04 DIAGNOSIS — Z510 Encounter for antineoplastic radiation therapy: Secondary | ICD-10-CM | POA: Diagnosis not present

## 2017-06-07 ENCOUNTER — Ambulatory Visit
Admission: RE | Admit: 2017-06-07 | Discharge: 2017-06-07 | Disposition: A | Discharge status: Home / Self Care | Payer: Medicare HMO | Source: Ambulatory Visit | Attending: Radiation Oncology | Admitting: Radiation Oncology

## 2017-06-07 DIAGNOSIS — D0511 Intraductal carcinoma in situ of right breast: Secondary | ICD-10-CM | POA: Diagnosis not present

## 2017-06-07 DIAGNOSIS — Z510 Encounter for antineoplastic radiation therapy: Secondary | ICD-10-CM | POA: Diagnosis not present

## 2017-06-07 DIAGNOSIS — C50512 Malignant neoplasm of lower-outer quadrant of left female breast: Secondary | ICD-10-CM | POA: Diagnosis not present

## 2017-06-08 ENCOUNTER — Ambulatory Visit
Admission: RE | Admit: 2017-06-08 | Discharge: 2017-06-08 | Disposition: A | Discharge status: Home / Self Care | Payer: Medicare HMO | Source: Ambulatory Visit | Attending: Radiation Oncology | Admitting: Radiation Oncology

## 2017-06-08 DIAGNOSIS — D0511 Intraductal carcinoma in situ of right breast: Secondary | ICD-10-CM | POA: Diagnosis not present

## 2017-06-08 DIAGNOSIS — Z510 Encounter for antineoplastic radiation therapy: Secondary | ICD-10-CM | POA: Diagnosis not present

## 2017-06-08 DIAGNOSIS — C50512 Malignant neoplasm of lower-outer quadrant of left female breast: Secondary | ICD-10-CM | POA: Diagnosis not present

## 2017-06-09 ENCOUNTER — Ambulatory Visit
Admission: RE | Admit: 2017-06-09 | Discharge: 2017-06-09 | Disposition: A | Discharge status: Home / Self Care | Payer: Medicare HMO | Source: Ambulatory Visit | Attending: Radiation Oncology | Admitting: Radiation Oncology

## 2017-06-09 DIAGNOSIS — C50512 Malignant neoplasm of lower-outer quadrant of left female breast: Secondary | ICD-10-CM | POA: Diagnosis not present

## 2017-06-09 DIAGNOSIS — Z510 Encounter for antineoplastic radiation therapy: Secondary | ICD-10-CM | POA: Diagnosis not present

## 2017-06-09 DIAGNOSIS — D0511 Intraductal carcinoma in situ of right breast: Secondary | ICD-10-CM | POA: Diagnosis not present

## 2017-06-10 ENCOUNTER — Ambulatory Visit
Admission: RE | Admit: 2017-06-10 | Discharge: 2017-06-10 | Disposition: A | Discharge status: Home / Self Care | Payer: Medicare HMO | Source: Ambulatory Visit | Attending: Radiation Oncology | Admitting: Radiation Oncology

## 2017-06-10 DIAGNOSIS — C50512 Malignant neoplasm of lower-outer quadrant of left female breast: Secondary | ICD-10-CM | POA: Diagnosis not present

## 2017-06-10 DIAGNOSIS — D0511 Intraductal carcinoma in situ of right breast: Secondary | ICD-10-CM | POA: Diagnosis not present

## 2017-06-10 DIAGNOSIS — Z510 Encounter for antineoplastic radiation therapy: Secondary | ICD-10-CM | POA: Diagnosis not present

## 2017-06-11 ENCOUNTER — Ambulatory Visit
Admission: RE | Admit: 2017-06-11 | Discharge: 2017-06-11 | Disposition: A | Discharge status: Home / Self Care | Payer: Medicare HMO | Source: Ambulatory Visit | Attending: Radiation Oncology | Admitting: Radiation Oncology

## 2017-06-11 ENCOUNTER — Ambulatory Visit: Payer: Medicare HMO | Admitting: Physical Therapy

## 2017-06-11 DIAGNOSIS — C50512 Malignant neoplasm of lower-outer quadrant of left female breast: Secondary | ICD-10-CM | POA: Diagnosis not present

## 2017-06-11 DIAGNOSIS — D0511 Intraductal carcinoma in situ of right breast: Secondary | ICD-10-CM | POA: Diagnosis not present

## 2017-06-11 DIAGNOSIS — M5441 Lumbago with sciatica, right side: Secondary | ICD-10-CM | POA: Diagnosis not present

## 2017-06-11 DIAGNOSIS — R2689 Other abnormalities of gait and mobility: Secondary | ICD-10-CM

## 2017-06-11 DIAGNOSIS — G8929 Other chronic pain: Secondary | ICD-10-CM

## 2017-06-11 DIAGNOSIS — M25612 Stiffness of left shoulder, not elsewhere classified: Secondary | ICD-10-CM

## 2017-06-11 DIAGNOSIS — R29898 Other symptoms and signs involving the musculoskeletal system: Secondary | ICD-10-CM

## 2017-06-11 DIAGNOSIS — Z510 Encounter for antineoplastic radiation therapy: Secondary | ICD-10-CM | POA: Diagnosis not present

## 2017-06-11 DIAGNOSIS — M25611 Stiffness of right shoulder, not elsewhere classified: Secondary | ICD-10-CM

## 2017-06-11 DIAGNOSIS — M5442 Lumbago with sciatica, left side: Principal | ICD-10-CM

## 2017-06-11 NOTE — Therapy (Signed)
[place], [place], [address] Phone: [phone]   Fax:  [phone]  Physical Therapy Treatment  Patient Details  Name: Monica [name] MRN: [id number] Date of Birth: [birth year] Referring Provider: Shona Simpson, PA-C   Encounter Date: 06/11/2017  PT End of Session - 06/11/17 0947    Visit Number  5    Number of Visits  9    Date for PT Re-Evaluation  06/21/17    PT Start Time  0802    PT Stop Time  7517    PT Time Calculation (min)  45 min    Activity Tolerance  Patient tolerated treatment well    Behavior During Therapy  [facility] for tasks assessed/performed       Past Medical History:  Diagnosis Date  . Allergy codeine   rapid heart beat, cold sweat  . Arthritis   . Breast cancer ([place]) 01/10/14   Left breast INVASIVE DUCTAL CARCINOMA STAGE 2  . Hypertension   . Hyperthyroidism    medication called in today for hyperthroid  . Personal history of chemotherapy 2015   Left Breast Cancer  . Personal history of radiation therapy 2015   Left Breast Cancer  . PONV (postoperative nausea and vomiting)   . S/P radiation therapy 09/21/14 completed   left breast    Past Surgical History:  Procedure Laterality Date  . ABDOMINAL HYSTERECTOMY  1992  . BREAST LUMPECTOMY Left 02/12/2014  . BREAST LUMPECTOMY WITH NEEDLE LOCALIZATION AND AXILLARY SENTINEL LYMPH NODE BX Left 02/12/2014   Procedure: LEFT BREAST LUMPECTOMY WITH NEEDLE LOCALIZATION AND LEFT AXILLARY SENTINEL LYMPH NODE BX;  Surgeon: [name], MD;  Location: [facility];  Service: General;  Laterality: Left;  . BREAST LUMPECTOMY WITH RADIOACTIVE SEED LOCALIZATION Right 04/02/2017   Procedure: BREAST LUMPECTOMY WITH RADIOACTIVE SEED LOCALIZATION;  Surgeon: Excell Seltzer, MD;  Location: [facility];  Service: General;  Laterality: Right;  . COLONOSCOPY    . FOOT ARTHROPLASTY  1998   right  . KNEE  ARTHROSCOPY  2012   right  . LUMBAR LAMINECTOMY  1991  . PORTACATH PLACEMENT Right 02/12/2014   Procedure: INSERTION PORT-A-CATH;  Surgeon: [name], MD;  Location: [facility];  Service: General;  Laterality: Right;    There were no vitals filed for this visit.  Subjective Assessment - 06/11/17 0806    Subjective  "I'm so fatigued.  In my second week of radiation." A pain shot all the way up into my neck and to my ear last night, I think it has to do with certain movement. "Those back exercises are helping a lot--I only took one pain pill yesterday."    Pertinent History  From Shona Simpson' note:  "Monica [name] is a pleasant 71 y.o. female who was diagnosed with essentially triple negative left breast cancer in 2015. She underwent lumpectomy 02/12/14 with sentinel lymph node evaluation followed by adjuvant chemotherapy and adjuvant radiotherapy to the breast under the care of Dr. Lisbeth Renshaw." On 04/02/17 she had right lumpectomy for ER/PR positive DCIS; she expects to have 4 weeks of XRT for this. Arthroscopy right knee 11/03/10 for debridement.  Lumbar laminectomy 07/30/89.  Hysterectomy 1992.  Nerve tumor Rt. foot 1998.  Expects to have TKA in 2019.  HTN controlled. Hypothyroid and on meds, which have recently been adjusted due to fatigue.  Pt. says she has been getting epidurals for the last two years, prn, every 3-6 months. Pt.  says she had some fluid on right breast and goes back next week to surgeon to see if she needs aspiration.    Patient Stated Goals  get her back feeling better so it won't be aggravated with the next course of radiation    Currently in Pain?  No/denies                      [facility] Adult PT Treatment/Exercise - 06/11/17 0001      Shoulder Exercises: Standing   ABduction  AAROM;Right;Left 3 reps each at finger ladder    ABduction Limitations  Rt. arm to #30, Lt. to #30      Shoulder Exercises: Pulleys   Flexion  2 minutes    ABduction   2 minutes      Shoulder Exercises: Therapy Ball   Flexion  10 reps keeping abs engaged, with extra stretch at top      Manual Therapy   Manual Therapy  Other (comment)    Passive ROM  neck sidebend to left    Other Manual Therapy  In sitting, soft tissue work to neck and upper back with Biotone for muscle relaxation.             PT Education - 06/11/17 0847    Education provided  Yes    Education Details  neck sidebend stretches    Person(s) Educated  Patient    Methods  Explanation;Demonstration;Verbal cues    Comprehension  Verbalized understanding;Returned demonstration             [facility] Goals - 06/11/17 3329      CC Long Term Goal  #1   Title  Patient will be independent in HEP for low back flexibility and strengthening, and core strengthening.    Status  Partially Met      CC Long Term Goal  #2   Title  Patient will report at least 50% decrease in low back pain and pain that radiates from low back.    Baseline  20-30% bette as of 05/28/17; almost 50% as of 06/11/17    Status  Partially Met      CC Long Term Goal  #3   Title  Quick DASH score will improve to 25 or less.    Status  On-going         Plan - 06/11/17 0947    Clinical Impression Statement  Pt. is doing well, having partially met her goals.  She feels the exercises are helping her back pain, and she feels like she has enough right now for that.  She had right neck/UE discomfort recently so time was spent on stretching and soft tissue work to this area today.  She has an area of swelling just to right of spine at lower cervical level. She did feel better at end of session today.    Rehab Potential  Good    PT Frequency  2x / week    PT Duration  4 weeks    PT Treatment/Interventions  ADLs/Self Care Home Management;Cryotherapy;Electrical Stimulation;Moist Heat;Therapeutic exercise;Balance training;Patient/family education;Manual techniques;Passive range of motion    PT Next Visit Plan   She may need renewal at next visit, as her current certification runs out 06/21/17. Add core strengthening to her HEP; continue to progress with low back stretching and strengthening, but also include shoulder stretching and neck work.    Consulted and Agree with Plan of Care  Patient  Patient will benefit from skilled therapeutic intervention in order to improve the following deficits and impairments:  Pain, Decreased range of motion, Decreased mobility, Decreased strength  Visit Diagnosis: Chronic midline low back pain with bilateral sciatica  Other symptoms and signs involving the musculoskeletal system  Other abnormalities of gait and mobility  Stiffness of left shoulder, not elsewhere classified  Stiffness of right shoulder, not elsewhere classified     Problem List Patient Active Problem List   Diagnosis Date Noted  . Malignant neoplasm of upper-inner quadrant of right breast in female, estrogen receptor positive ([place]) 02/24/2017  . Hot flashes 04/05/2015  . Other pancytopenia ([place]) 06/28/2014  . Corneal abrasion 06/27/2014  . Anemia associated with chemotherapy 06/25/2014  . Thrombocytopenia ([place]) 06/25/2014  . Chemotherapy-induced neuropathy ([place]) 06/04/2014  . Decreased appetite 05/28/2014  . Body aches 05/28/2014  . Dyspepsia 05/28/2014  . Insomnia 05/02/2014  . UTI (urinary tract infection) 04/17/2014  . Febrile neutropenia ([place]) 04/15/2014  . Hypokalemia 04/15/2014  . Hypertension 04/15/2014  . Itchy eyes 04/09/2014  . Heart palpitations 04/04/2014  . Nausea without vomiting 03/26/2014  . Heartburn 03/26/2014  . Thrush 03/26/2014  . Breast cancer of lower-outer quadrant of left female breast ([place]) 01/12/2014    Rameses Ou 06/11/2017, 9:51 AM  [place], [place], [address] Phone: [phone]   Fax:  [phone]  Name: Monica [name] MRN: [id number] Date of  Birth: 1946-04-16  Serafina Royals, PT 06/11/17 9:51 AM

## 2017-06-14 ENCOUNTER — Ambulatory Visit
Admission: RE | Admit: 2017-06-14 | Discharge: 2017-06-14 | Disposition: A | Discharge status: Home / Self Care | Payer: Medicare HMO | Source: Ambulatory Visit | Attending: Radiation Oncology | Admitting: Radiation Oncology

## 2017-06-14 DIAGNOSIS — C50512 Malignant neoplasm of lower-outer quadrant of left female breast: Secondary | ICD-10-CM | POA: Diagnosis not present

## 2017-06-14 DIAGNOSIS — Z510 Encounter for antineoplastic radiation therapy: Secondary | ICD-10-CM | POA: Diagnosis not present

## 2017-06-14 DIAGNOSIS — D0511 Intraductal carcinoma in situ of right breast: Secondary | ICD-10-CM | POA: Diagnosis not present

## 2017-06-16 ENCOUNTER — Ambulatory Visit: Payer: Medicare HMO | Admitting: Physical Therapy

## 2017-06-16 ENCOUNTER — Encounter: Payer: Self-pay | Admitting: Physical Therapy

## 2017-06-16 ENCOUNTER — Ambulatory Visit
Admission: RE | Admit: 2017-06-16 | Discharge: 2017-06-16 | Disposition: A | Discharge status: Home / Self Care | Payer: Medicare HMO | Source: Ambulatory Visit | Attending: Radiation Oncology | Admitting: Radiation Oncology

## 2017-06-16 DIAGNOSIS — M5442 Lumbago with sciatica, left side: Principal | ICD-10-CM

## 2017-06-16 DIAGNOSIS — M25612 Stiffness of left shoulder, not elsewhere classified: Secondary | ICD-10-CM

## 2017-06-16 DIAGNOSIS — C50512 Malignant neoplasm of lower-outer quadrant of left female breast: Secondary | ICD-10-CM | POA: Diagnosis not present

## 2017-06-16 DIAGNOSIS — M5441 Lumbago with sciatica, right side: Secondary | ICD-10-CM | POA: Diagnosis not present

## 2017-06-16 DIAGNOSIS — R2689 Other abnormalities of gait and mobility: Secondary | ICD-10-CM

## 2017-06-16 DIAGNOSIS — R29898 Other symptoms and signs involving the musculoskeletal system: Secondary | ICD-10-CM

## 2017-06-16 DIAGNOSIS — D0511 Intraductal carcinoma in situ of right breast: Secondary | ICD-10-CM | POA: Diagnosis not present

## 2017-06-16 DIAGNOSIS — M25611 Stiffness of right shoulder, not elsewhere classified: Secondary | ICD-10-CM

## 2017-06-16 DIAGNOSIS — G8929 Other chronic pain: Secondary | ICD-10-CM

## 2017-06-16 DIAGNOSIS — Z510 Encounter for antineoplastic radiation therapy: Secondary | ICD-10-CM | POA: Diagnosis not present

## 2017-06-16 NOTE — Patient Instructions (Addendum)
First of all, check with your insurance company to see if provider is in [place]   (for wigs and compression sleeves / gloves/gauntlets )  [place], [place] [address] [phone]  Will file some insurances --- call for appointment   Second to [facility] (for mastectomy prosthetics and garments) [place], [place] [address] [phone] Will file some insurances --- call for appointment  [facility]  [address]  [place], [place] [address] [phone] Lower extremity garments  [facility] [address] [place], [place]  [address] [place] Sales rep:  [place]:  [phone] [email or web address] Biocompression pumps   [employer]  Sales rep: Donneta Romberg:  [phone] [email or web address] [place] Pulley and Flexitouch pumps    Other Resources: National Lymphedema Network:  www.lymphnet.org www.Klosetraining.com for patient articles and purchase a self manual lymph drainage DVD www.lymphedemablog.com has informative articles.        PELVIC TILT  Lie on back, legs bent. Exhale, tilting top of pelvis back, pubic bone up, to flatten lower back. Inhale, rolling pelvis opposite way, top forward, pubic bone down, arch in back. Repeat __10__ times. Do __2__ sessions per day. Copyright  VHI. All rights reserved.     Lie with hips and knees bent. Slowly inhale, and then exhale. Pull navel toward spine and tighten pelvic floor. Hold for __10_ seconds. Continue to breathe in and out during hold. Rest for _10__ seconds. Repeat __10_ times. Do __2-3_ times a day.   Copyright  VHI. All rights reserved.  Knee Fold   Lie on back, legs bent, arms by sides. Exhale, lifting knee to chest. Inhale, returning. Keep abdominals flat, navel to spine. Repeat __10__ times, alternating legs. Do __2__ sessions per day.  Copyright  VHI. All rights reserved.  Knee  Drop   Keep pelvis stable. Without rotating hips, slowly drop knee to side, pause, return to center, bring knee across midline toward opposite hip. Feel obliques engaging. Repeat for ___10_ times each leg.  Copyright  VHI. All rights reserved.  Isometric Hold With Pelvic Floor (Hook-Lying)    Copyright  VHI. All rights reserved.  Heel Slide to Straight   Slide one leg down to straight. Return. Be sure pelvis does not rock forward, tilt, rotate, or tip to side. Do _10__ times. Restabilize pelvis. Repeat with other leg. Do __1-2_ sets, __2_ times per day.  http://ss.exer.us/16   Copyright  VHI. All rights reserved.

## 2017-06-16 NOTE — Therapy (Signed)
[place], [place], [address] Phone: [phone]   Fax:  [phone]  Physical Therapy Treatment  Patient Details  Name: Monica [name] MRN: [id number] Date of Birth: 21-Mar-1946 Referring Provider: Shona Simpson, PA-C   Encounter Date: 06/16/2017  PT End of Session - 06/16/17 1254    Visit Number  6    Number of Visits  9    Date for PT Re-Evaluation  06/21/17    PT Start Time  0800    PT Stop Time  0845    PT Time Calculation (min)  45 min    Activity Tolerance  Patient tolerated treatment well    Behavior During Therapy  [facility] for tasks assessed/performed       Past Medical History:  Diagnosis Date  . Allergy codeine   rapid heart beat, cold sweat  . Arthritis   . Breast cancer ([place]) 01/10/14   Left breast INVASIVE DUCTAL CARCINOMA STAGE 2  . Hypertension   . Hyperthyroidism    medication called in today for hyperthroid  . Personal history of chemotherapy 2015   Left Breast Cancer  . Personal history of radiation therapy 2015   Left Breast Cancer  . PONV (postoperative nausea and vomiting)   . S/P radiation therapy 09/21/14 completed   left breast    Past Surgical History:  Procedure Laterality Date  . ABDOMINAL HYSTERECTOMY  1992  . BREAST LUMPECTOMY Left 02/12/2014  . BREAST LUMPECTOMY WITH NEEDLE LOCALIZATION AND AXILLARY SENTINEL LYMPH NODE BX Left 02/12/2014   Procedure: LEFT BREAST LUMPECTOMY WITH NEEDLE LOCALIZATION AND LEFT AXILLARY SENTINEL LYMPH NODE BX;  Surgeon: [name], MD;  Location: [facility];  Service: General;  Laterality: Left;  . BREAST LUMPECTOMY WITH RADIOACTIVE SEED LOCALIZATION Right 04/02/2017   Procedure: BREAST LUMPECTOMY WITH RADIOACTIVE SEED LOCALIZATION;  Surgeon: Excell Seltzer, MD;  Location: [facility];  Service: General;  Laterality: Right;  . COLONOSCOPY    . FOOT ARTHROPLASTY  1998   right  . KNEE  ARTHROSCOPY  2012   right  . LUMBAR LAMINECTOMY  1991  . PORTACATH PLACEMENT Right 02/12/2014   Procedure: INSERTION PORT-A-CATH;  Surgeon: [name], MD;  Location: [facility];  Service: General;  Laterality: Right;    There were no vitals filed for this visit.  Subjective Assessment - 06/16/17 0806    Subjective  Pt states she is doing pretty well, but she feels pretty fatigued.   She has not been able to do walking because she needs double knee replacement     Pertinent History  From Shona Simpson' note:  "Monica [name] is a pleasant 71 y.o. female who was diagnosed with essentially triple negative left breast cancer in 2015. She underwent lumpectomy 02/12/14 with sentinel lymph node evaluation followed by adjuvant chemotherapy and adjuvant radiotherapy to the breast under the care of Dr. Lisbeth Renshaw." On 04/02/17 she had right lumpectomy for ER/PR positive DCIS; she expects to have 4 weeks of XRT for this. Arthroscopy right knee 11/03/10 for debridement.  Lumbar laminectomy 07/30/89.  Hysterectomy 1992.  Nerve tumor Rt. foot 1998.  Expects to have TKA in 2019.  HTN controlled. Hypothyroid and on meds, which have recently been adjusted due to fatigue.  Pt. says she has been getting epidurals for the last two years, prn, every 3-6 months. Pt. says she had some fluid on right breast and goes back next week to surgeon to see if she  needs aspiration.    Patient Stated Goals  get her back feeling better so it won't be aggravated with the next course of radiation    Currently in Pain?  No/denies         [facility] PT Assessment - 06/16/17 0001      Observation/Other Assessments   Quick DASH   36.36           Quick Dash - 06/16/17 0001    Open a tight or new jar  Moderate difficulty    Do heavy household chores (wash walls, wash floors)  Moderate difficulty    Carry a shopping bag or briefcase  Moderate difficulty    Wash your back  Mild difficulty    Use a knife to cut food   No difficulty    Recreational activities in which you take some force or impact through your arm, shoulder, or hand (golf, hammering, tennis)  Severe difficulty    During the past week, to what extent has your arm, shoulder or hand problem interfered with your normal social activities with family, friends, neighbors, or groups?  Slightly    During the past week, to what extent has your arm, shoulder or hand problem limited your work or other regular daily activities  Slightly    Arm, shoulder, or hand pain.  Moderate    Tingling (pins and needles) in your arm, shoulder, or hand  None    Difficulty Sleeping  Moderate difficulty    DASH Score  36.36 %            [facility] Adult PT Treatment/Exercise - 06/16/17 0001      Self-Care   Self-Care  Other Self-Care Comments    Other Self-Care Comments   pt inquiring about a compression sleeve so was given information about getting a prophylactic sleeve from [facility]        Lumbar Exercises: Supine   Other Supine Lumbar Exercises  transverse abdominal series: pelvic tilts, lower core activation, bent knee raise, knee drops, heel slides allx 5 reps with each leg       Shoulder Exercises: Pulleys   Flexion  2 minutes    ABduction  2 minutes      Manual Therapy   Passive ROM  neck sidebend to left    Other Manual Therapy  In sitting, soft tissue work to neck and upper back with Biotone for muscle relaxation.      (sleeve to be funded by [employer])               [facility] Goals - 06/16/17 1252      CC Long Term Goal  #1   Title  Patient will be independent in HEP for low back flexibility and strengthening, and core strengthening.    Status  Achieved      CC Long Term Goal  #2   Title  Patient will report at least 50% decrease in low back pain and pain that radiates from low back.    Baseline  20-30% bette as of 05/28/17; almost 50% as of 06/11/17, pt reports more than 50% imiproved     Status  Achieved       CC Long Term Goal  #3   Title  Quick DASH score will improve to 25 or less.    Baseline  38.64 at eval, 36.36 on 06/16/2017    Status  Partially Met         Plan - 06/16/17  1255    Clinical Impression Statement  Pt continues to have fatigue but feels that she has had improvment with PT .  She still has some pian in neck from radiation but will be getting neck massages at the cancer center.  She feels that she is ready to dishcarge from PT.  she may ask to return after radiation is completed if she still is having problems with neck pain     PT Next Visit Plan  Discharge this episode        Patient will benefit from skilled therapeutic intervention in order to improve the following deficits and impairments:  Pain, Decreased range of motion, Decreased mobility, Decreased strength  Visit Diagnosis: Chronic midline low back pain with bilateral sciatica  Other symptoms and signs involving the musculoskeletal system  Other abnormalities of gait and mobility  Stiffness of left shoulder, not elsewhere classified  Stiffness of right shoulder, not elsewhere classified   G-Codes - 06/19/17 1253    Functional Limitation  Carrying, moving and handling objects    Carrying, Moving and Handling Objects Goal Status ([id number])  At least 20 percent but less than 40 percent impaired, limited or restricted    Carrying, Moving and Handling Objects Discharge Status [id number])  At least 20 percent but less than 40 percent impaired, limited or restricted       Problem List Patient Active Problem List   Diagnosis Date Noted  . Malignant neoplasm of upper-inner quadrant of right breast in female, estrogen receptor positive ([place]) 02/24/2017  . Hot flashes 04/05/2015  . Other pancytopenia ([place]) 06/28/2014  . Corneal abrasion 06/27/2014  . Anemia associated with chemotherapy 06/25/2014  . Thrombocytopenia ([place]) 06/25/2014  . Chemotherapy-induced neuropathy ([place]) 06/04/2014  . Decreased appetite  05/28/2014  . Body aches 05/28/2014  . Dyspepsia 05/28/2014  . Insomnia 05/02/2014  . UTI (urinary tract infection) 04/17/2014  . Febrile neutropenia ([place]) 04/15/2014  . Hypokalemia 04/15/2014  . Hypertension 04/15/2014  . Itchy eyes 04/09/2014  . Heart palpitations 04/04/2014  . Nausea without vomiting 03/26/2014  . Heartburn 03/26/2014  . Thrush 03/26/2014  . Breast cancer of lower-outer quadrant of left female breast ([place]) 01/12/2014   PHYSICAL THERAPY DISCHARGE SUMMARY  Visits from Start of Care: 6 Current functional level related to goals / functional outcomes: As above    Remaining deficits: Fatigue and neck pain    Education / Equipment: Home exercise, lymphedema risk reduction Plan: Patient agrees to discharge.  Patient goals were partially met. Patient is being discharged due to being pleased with the current functional level.  ?????      Donato Heinz. Owens Shark PT  [place]wood Levo 2017-06-19, 1:02 PM  [place], [place], [address] Phone: [phone]   Fax:  [phone]  Name: Monica [name] MRN: [id number] Date of Birth: April 22, 1946

## 2017-06-17 ENCOUNTER — Ambulatory Visit
Admission: RE | Admit: 2017-06-17 | Discharge: 2017-06-17 | Disposition: A | Discharge status: Home / Self Care | Payer: Medicare HMO | Source: Ambulatory Visit | Attending: Radiation Oncology | Admitting: Radiation Oncology

## 2017-06-17 DIAGNOSIS — Z510 Encounter for antineoplastic radiation therapy: Secondary | ICD-10-CM | POA: Diagnosis not present

## 2017-06-17 DIAGNOSIS — D0511 Intraductal carcinoma in situ of right breast: Secondary | ICD-10-CM | POA: Diagnosis not present

## 2017-06-17 DIAGNOSIS — C50512 Malignant neoplasm of lower-outer quadrant of left female breast: Secondary | ICD-10-CM | POA: Diagnosis not present

## 2017-06-18 ENCOUNTER — Ambulatory Visit
Admission: RE | Admit: 2017-06-18 | Discharge: 2017-06-18 | Disposition: A | Discharge status: Home / Self Care | Payer: Medicare HMO | Source: Ambulatory Visit | Attending: Radiation Oncology | Admitting: Radiation Oncology

## 2017-06-18 DIAGNOSIS — Z510 Encounter for antineoplastic radiation therapy: Secondary | ICD-10-CM | POA: Diagnosis not present

## 2017-06-18 DIAGNOSIS — C50211 Malignant neoplasm of upper-inner quadrant of right female breast: Secondary | ICD-10-CM

## 2017-06-18 DIAGNOSIS — C50512 Malignant neoplasm of lower-outer quadrant of left female breast: Secondary | ICD-10-CM | POA: Diagnosis not present

## 2017-06-18 DIAGNOSIS — D0511 Intraductal carcinoma in situ of right breast: Secondary | ICD-10-CM | POA: Diagnosis not present

## 2017-06-18 DIAGNOSIS — Z170 Estrogen receptor positive status [ER+]: Secondary | ICD-10-CM

## 2017-06-21 ENCOUNTER — Ambulatory Visit
Admission: RE | Admit: 2017-06-21 | Discharge: 2017-06-21 | Disposition: A | Discharge status: Home / Self Care | Payer: Medicare HMO | Source: Ambulatory Visit | Attending: Radiation Oncology | Admitting: Radiation Oncology

## 2017-06-21 DIAGNOSIS — D0511 Intraductal carcinoma in situ of right breast: Secondary | ICD-10-CM | POA: Diagnosis not present

## 2017-06-21 DIAGNOSIS — Z510 Encounter for antineoplastic radiation therapy: Secondary | ICD-10-CM | POA: Diagnosis not present

## 2017-06-21 DIAGNOSIS — C50512 Malignant neoplasm of lower-outer quadrant of left female breast: Secondary | ICD-10-CM | POA: Diagnosis not present

## 2017-06-23 ENCOUNTER — Ambulatory Visit
Admission: RE | Admit: 2017-06-23 | Discharge: 2017-06-23 | Disposition: A | Discharge status: Home / Self Care | Payer: Medicare HMO | Source: Ambulatory Visit | Attending: Radiation Oncology | Admitting: Radiation Oncology

## 2017-06-23 ENCOUNTER — Ambulatory Visit: Payer: Medicare HMO

## 2017-06-23 DIAGNOSIS — D0511 Intraductal carcinoma in situ of right breast: Secondary | ICD-10-CM | POA: Diagnosis not present

## 2017-06-23 DIAGNOSIS — C50512 Malignant neoplasm of lower-outer quadrant of left female breast: Secondary | ICD-10-CM | POA: Diagnosis not present

## 2017-06-23 DIAGNOSIS — Z510 Encounter for antineoplastic radiation therapy: Secondary | ICD-10-CM | POA: Diagnosis not present

## 2017-06-24 ENCOUNTER — Ambulatory Visit
Admission: RE | Admit: 2017-06-24 | Discharge: 2017-06-24 | Disposition: A | Discharge status: Home / Self Care | Payer: Medicare HMO | Source: Ambulatory Visit | Attending: Radiation Oncology | Admitting: Radiation Oncology

## 2017-06-24 DIAGNOSIS — Z510 Encounter for antineoplastic radiation therapy: Secondary | ICD-10-CM | POA: Diagnosis not present

## 2017-06-24 DIAGNOSIS — C50512 Malignant neoplasm of lower-outer quadrant of left female breast: Secondary | ICD-10-CM | POA: Diagnosis not present

## 2017-06-24 DIAGNOSIS — D0511 Intraductal carcinoma in situ of right breast: Secondary | ICD-10-CM | POA: Diagnosis not present

## 2017-06-25 ENCOUNTER — Ambulatory Visit
Admission: RE | Admit: 2017-06-25 | Discharge: 2017-06-25 | Disposition: A | Discharge status: Home / Self Care | Payer: Medicare HMO | Source: Ambulatory Visit | Attending: Radiation Oncology | Admitting: Radiation Oncology

## 2017-06-25 ENCOUNTER — Ambulatory Visit: Payer: Medicare HMO | Admitting: Physical Therapy

## 2017-06-25 DIAGNOSIS — Z170 Estrogen receptor positive status [ER+]: Secondary | ICD-10-CM

## 2017-06-25 DIAGNOSIS — C50211 Malignant neoplasm of upper-inner quadrant of right female breast: Secondary | ICD-10-CM

## 2017-06-25 DIAGNOSIS — C50512 Malignant neoplasm of lower-outer quadrant of left female breast: Secondary | ICD-10-CM | POA: Diagnosis not present

## 2017-06-25 DIAGNOSIS — Z510 Encounter for antineoplastic radiation therapy: Secondary | ICD-10-CM | POA: Diagnosis not present

## 2017-06-25 DIAGNOSIS — D0511 Intraductal carcinoma in situ of right breast: Secondary | ICD-10-CM | POA: Diagnosis not present

## 2017-06-28 ENCOUNTER — Other Ambulatory Visit: Payer: Self-pay | Admitting: Radiation Oncology

## 2017-06-28 ENCOUNTER — Ambulatory Visit
Admission: RE | Admit: 2017-06-28 | Discharge: 2017-06-28 | Disposition: A | Discharge status: Home / Self Care | Payer: Medicare HMO | Source: Ambulatory Visit | Attending: Radiation Oncology | Admitting: Radiation Oncology

## 2017-06-28 DIAGNOSIS — D0511 Intraductal carcinoma in situ of right breast: Secondary | ICD-10-CM | POA: Diagnosis not present

## 2017-06-28 DIAGNOSIS — C50512 Malignant neoplasm of lower-outer quadrant of left female breast: Secondary | ICD-10-CM | POA: Diagnosis not present

## 2017-06-28 DIAGNOSIS — Z510 Encounter for antineoplastic radiation therapy: Secondary | ICD-10-CM | POA: Diagnosis not present

## 2017-06-28 MED ORDER — AMOXICILLIN-POT CLAVULANATE 875-125 MG PO TABS: 1.0000 | ORAL_TABLET | Freq: Two times a day (BID) | ORAL | 0 refills | Status: DC

## 2017-06-28 MED ORDER — BENZONATATE 100 MG PO CAPS: 100.0000 mg | ORAL_CAPSULE | Freq: Three times a day (TID) | ORAL | 0 refills | Status: DC | PRN

## 2017-06-29 ENCOUNTER — Ambulatory Visit
Admission: RE | Admit: 2017-06-29 | Discharge: 2017-06-29 | Disposition: A | Discharge status: Home / Self Care | Payer: Medicare HMO | Source: Ambulatory Visit | Attending: Radiation Oncology | Admitting: Radiation Oncology

## 2017-06-29 DIAGNOSIS — Z510 Encounter for antineoplastic radiation therapy: Secondary | ICD-10-CM | POA: Diagnosis not present

## 2017-06-30 ENCOUNTER — Ambulatory Visit
Admission: RE | Admit: 2017-06-30 | Discharge: 2017-06-30 | Disposition: A | Discharge status: Home / Self Care | Payer: Medicare HMO | Source: Ambulatory Visit | Attending: Radiation Oncology | Admitting: Radiation Oncology

## 2017-06-30 DIAGNOSIS — Z510 Encounter for antineoplastic radiation therapy: Secondary | ICD-10-CM | POA: Diagnosis not present

## 2017-07-01 ENCOUNTER — Ambulatory Visit
Admission: RE | Admit: 2017-07-01 | Discharge: 2017-07-01 | Disposition: A | Discharge status: Home / Self Care | Payer: Medicare HMO | Source: Ambulatory Visit | Attending: Radiation Oncology | Admitting: Radiation Oncology

## 2017-07-01 DIAGNOSIS — C50512 Malignant neoplasm of lower-outer quadrant of left female breast: Secondary | ICD-10-CM | POA: Diagnosis not present

## 2017-07-01 DIAGNOSIS — Z510 Encounter for antineoplastic radiation therapy: Secondary | ICD-10-CM | POA: Diagnosis not present

## 2017-07-01 DIAGNOSIS — D0511 Intraductal carcinoma in situ of right breast: Secondary | ICD-10-CM | POA: Diagnosis not present

## 2017-07-02 ENCOUNTER — Ambulatory Visit: Payer: Medicare HMO | Attending: Radiation Oncology | Admitting: Physical Therapy

## 2017-07-02 ENCOUNTER — Encounter: Payer: Self-pay | Admitting: Physical Therapy

## 2017-07-02 DIAGNOSIS — R293 Abnormal posture: Secondary | ICD-10-CM | POA: Diagnosis present

## 2017-07-02 DIAGNOSIS — M542 Cervicalgia: Secondary | ICD-10-CM | POA: Diagnosis present

## 2017-07-02 DIAGNOSIS — M25611 Stiffness of right shoulder, not elsewhere classified: Secondary | ICD-10-CM | POA: Diagnosis present

## 2017-07-02 DIAGNOSIS — C50912 Malignant neoplasm of unspecified site of left female breast: Secondary | ICD-10-CM | POA: Diagnosis not present

## 2017-07-02 NOTE — Therapy (Signed)
Monica [name], [name], [address] Phone: [phone]   Fax:  [phone]  Physical Therapy Evaluation  Patient Details  Name: Monica [name] MRN: [id number] Date of Birth: [birth year] Referring Provider: Dr. Lisbeth Renshaw   Encounter Date: 07/02/2017  PT End of Session - 07/02/17 1231    Visit Number  1    Number of Visits  9    Date for PT Re-Evaluation  07/30/17    PT Start Time  5852 pt arrived late    PT Stop Time  0930    PT Time Calculation (min)  32 min    Activity Tolerance  Patient tolerated treatment well    Behavior During Therapy  [facility] for tasks assessed/performed       Past Medical History:  Diagnosis Date  . Allergy codeine   rapid heart beat, cold sweat  . Arthritis   . Breast cancer ([place]) 01/10/14   Left breast INVASIVE DUCTAL CARCINOMA STAGE 2  . Hypertension   . Hyperthyroidism    medication called in today for hyperthroid  . Personal history of chemotherapy 2015   Left Breast Cancer  . Personal history of radiation therapy 2015   Left Breast Cancer  . PONV (postoperative nausea and vomiting)   . S/P radiation therapy 09/21/14 completed   left breast    Past Surgical History:  Procedure Laterality Date  . ABDOMINAL HYSTERECTOMY  1992  . BREAST LUMPECTOMY Left 02/12/2014  . BREAST LUMPECTOMY WITH NEEDLE LOCALIZATION AND AXILLARY SENTINEL LYMPH NODE BX Left 02/12/2014   Procedure: LEFT BREAST LUMPECTOMY WITH NEEDLE LOCALIZATION AND LEFT AXILLARY SENTINEL LYMPH NODE BX;  Surgeon: [name], MD;  Location: [place];  Service: General;  Laterality: Left;  . BREAST LUMPECTOMY WITH RADIOACTIVE SEED LOCALIZATION Right 04/02/2017   Procedure: BREAST LUMPECTOMY WITH RADIOACTIVE SEED LOCALIZATION;  Surgeon: Excell Seltzer, MD;  Location: [place];  Service: General;  Laterality: Right;  . COLONOSCOPY    . FOOT ARTHROPLASTY  1998   right  . KNEE  ARTHROSCOPY  2012   right  . LUMBAR LAMINECTOMY  1991  . PORTACATH PLACEMENT Right 02/12/2014   Procedure: INSERTION PORT-A-CATH;  Surgeon: [name], MD;  Location: [place];  Service: General;  Laterality: Right;    There were no vitals filed for this visit.   Subjective Assessment - 07/02/17 0859    Subjective  I am having pain in my right neck which got very irritated during radiation. When I raise my right arm I feel this pull in my neck.     Pertinent History  From Shona Simpson' note:  "[name] is a pleasant 72 y.o. female who was diagnosed with essentially triple negative left breast cancer in 2015. She underwent lumpectomy 02/12/14 with sentinel lymph node evaluation followed by adjuvant chemotherapy and adjuvant radiotherapy to the breast under the care of Dr. Lisbeth Renshaw." On 04/02/17 she had right lumpectomy for ER/PR positive DCIS; she expects to have 4 weeks of XRT for this. Arthroscopy right knee 11/03/10 for debridement.  Lumbar laminectomy 07/30/89.  Hysterectomy 1992.  Nerve tumor Rt. foot 1998.  Expects to have TKA in 2019.  HTN controlled. Hypothyroid and on meds, which have recently been adjusted due to fatigue.  Pt. says she has been getting epidurals for the last two years, prn, every 3-6 months. Pt. says she had some fluid on right breast and goes back next week to surgeon to see  if she needs aspiration. pt has now completed radiation for her right breast    Patient Stated Goals  to get this knot out of my neck and to get some relief and to be able to move my neck without pain    Currently in Pain?  Yes    Pain Score  5     Pain Location  Neck    Pain Orientation  Right    Pain Descriptors / Indicators  Tightness;Heaviness    Pain Type  Chronic pain    Pain Onset  More than a month ago    Pain Frequency  Intermittent    Aggravating Factors   moving her neck, raisign arms    Pain Relieving Factors  massage    Effect of Pain on Daily Activities   does not limit but is aggrevating because of pain         [facility] PT Assessment - 07/02/17 0001      Assessment   Medical Diagnosis  h/o left and right breast cancer     Referring Provider  Dr. Lisbeth Renshaw    Onset Date/Surgical Date  04/02/17 right breast surgery    Hand Dominance  Right    Prior Therapy  was receiving therapy at this clinic last year for back pain      Precautions   Precautions  Other (comment)    Precaution Comments  at risk for lymphedema      Restrictions   Weight Bearing Restrictions  No but has very arthritic knees      Balance Screen   Has the patient fallen in the past 6 months  No    Has the patient had a decrease in activity level because of a fear of falling?   No    Is the patient reluctant to leave their home because of a fear of falling?   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    Leisure  was going to [employer] (has done [employer]), but hasn't been regular since reinjuring her back      Cognition   Overall Cognitive Status  Within Functional Limits for tasks assessed      Observation/Other Assessments   Observations  large area of tightness palpated in right upper shoulder in area of upper trap and levator    Skin Integrity  pt reports her right breast feels heavy    Quick DASH   45.5      Posture/Postural Control   Posture/Postural Control  Postural limitations    Postural Limitations  Forward head      AROM   AROM Assessment Site  Cervical    Right/Left Shoulder  Right;Left    Right Shoulder Flexion  151 Degrees    Right Shoulder ABduction  161 Degrees     Right Shoulder Internal Rotation  54 Degrees with pain in shoulder    Right Shoulder External Rotation  86 Degrees  Left Shoulder Flexion  154 Degrees    Left Shoulder ABduction  171 Degrees    Left Shoulder Internal Rotation  50 Degrees    Left Shoulder External Rotation  90 Degrees    Cervical Flexion  full ROM no pain    Cervical Extension  25% loss with pain    Cervical - Right Side Bend  25% loss with pain    Cervical - Left Side Bend  25% loss with pain    Cervical - Right Rotation  25% loss with pain    Cervical - Left Rotation  full ROM no pain    Lumbar Flexion  --    Lumbar Extension  --    Lumbar - Right Side Bend  --    Lumbar - Left Side Bend  --    Lumbar - Right Rotation  --    Lumbar - Left Rotation  --      Special Tests    Special Tests  --    Lumbar Tests  --      other   Findings  --    Side   --    Comments  --      other   Findings  --    Side  --    Comment  --      Ambulation/Gait   Ambulation/Gait  --    Ambulation/Gait Assistance  --    Gait Comments  --           Katina Dung - 07/02/17 0001    Open a tight or new jar  Mild difficulty    Do heavy household chores (wash walls, wash floors)  Moderate difficulty    Carry a shopping bag or briefcase  Moderate difficulty    Wash your back  Moderate difficulty    Use a knife to cut food  No difficulty    Recreational activities in which you take some force or impact through your arm, shoulder, or hand (golf, hammering, tennis)  Severe difficulty    During the past week, to what extent has your arm, shoulder or hand problem interfered with your normal social activities with family, friends, neighbors, or groups?  Modererately    During the past week, to what extent has your arm, shoulder or hand problem limited your work or other regular daily activities  Modererately    Arm, shoulder, or hand pain.  Moderate    Tingling (pins and needles) in your arm, shoulder, or hand  Moderate     Difficulty Sleeping  Moderate difficulty    DASH Score  45.45 %       Objective measurements completed on examination: See above findings.                   PT Long Term Goals - 07/02/17 1237      PT LONG TERM GOAL #1   Title  Pt will report a 75% reduction in cervical pain to allow pt to look over shoulder with less discomfort    Time  4    Period  Weeks    Status  New    Target Date  07/30/17      PT LONG TERM GOAL #2   Title  Pt will be independent in a home exercise program for continued strengthening and stretching    Time  4    Period  Weeks    Status  New    Target Date  07/30/17  PT LONG TERM GOAL #3   Title  Pt will demonstrate full cervical ROM to allow pt to look up and over shoulder without limitation and pain    Baseline  25% loss    Time  4    Status  New    Target Date  07/30/17      PT LONG TERM GOAL #4   Title  Pt will decrease score on QuickDash to less than 20 to allow improved RUE function    Baseline  45.5    Time  4    Period  Days    Status  New    Target Date  07/30/17        Long Term Clinic Goals - 06/16/17 1252      CC Long Term Goal  #1   Title  Patient will be independent in HEP for low back flexibility and strengthening, and core strengthening.    Status  Achieved      CC Long Term Goal  #2   Title  Patient will report at least 50% decrease in low back pain and pain that radiates from low back.    Baseline  20-30% bette as of 05/28/17; almost 50% as of 06/11/17, pt reports more than 50% imiproved     Status  Achieved      CC Long Term Goal  #3   Title  Quick DASH score will improve to 25 or less.    Baseline  38.64 at eval, 36.36 on 06/16/2017    Status  Partially Met          Plan - 07/02/17 1232    Clinical Impression Statement  Pt presents to PT with right shoulder tightness, decreased neck ROM and neck pain. Pt has limited neck ROM in lateral flexion, rotation and extension and pain with those  movements. She reports it worsened following radiation for treatment of right breast cancer. There is a large palpable area of tightness in right upper shoulder in area of levator and upper traps. Pt has tenderness in this area. She would benefit from skilled PT services to increase neck ROM, decrease neck pain and increase right shoulder ROM and strength.     History and Personal Factors relevant to plan of care:  multiple arthritis-related problems including both knees need to be replaced, remote h/o lumbar laminectomy, hx of bilateral breast cancer    Clinical Presentation  Evolving    Clinical Presentation due to:  still healing from radiation    Rehab Potential  Good    Clinical Impairments Affecting Rehab Potential  needs bilateral knee replacements    PT Frequency  2x / week    PT Duration  4 weeks    PT Treatment/Interventions  ADLs/Self Care Home Management;Cryotherapy;Electrical Stimulation;Moist Heat;Therapeutic exercise;Patient/family education;Manual techniques;Passive range of motion;Therapeutic activities;Taping    PT Next Visit Plan  begin soft tissue mobilization to right upper shoulder in area of tightness, give gentle neck ROM exercises, evenutally give shoulder ROM and strengthening when neck pain is decreased    Consulted and Agree with Plan of Care  Patient       Patient will benefit from skilled therapeutic intervention in order to improve the following deficits and impairments:  Pain, Decreased range of motion, Decreased mobility, Decreased strength  Visit Diagnosis: Cervicalgia - Plan: PT plan of care cert/re-cert  Stiffness of right shoulder, not elsewhere classified - Plan: PT plan of care cert/re-cert  Abnormal posture - Plan: PT plan of care cert/re-cert  Problem List Patient Active Problem List   Diagnosis Date Noted  . Malignant neoplasm of upper-inner quadrant of right breast in female, estrogen receptor positive ([place]) 02/24/2017  . Hot flashes  04/05/2015  . Other pancytopenia ([place]) 06/28/2014  . Corneal abrasion 06/27/2014  . Anemia associated with chemotherapy 06/25/2014  . Thrombocytopenia ([place]) 06/25/2014  . Chemotherapy-induced neuropathy ([place]) 06/04/2014  . Decreased appetite 05/28/2014  . Body aches 05/28/2014  . Dyspepsia 05/28/2014  . Insomnia 05/02/2014  . UTI (urinary tract infection) 04/17/2014  . Febrile neutropenia ([place]) 04/15/2014  . Hypokalemia 04/15/2014  . Hypertension 04/15/2014  . Itchy eyes 04/09/2014  . Heart palpitations 04/04/2014  . Nausea without vomiting 03/26/2014  . Heartburn 03/26/2014  . Thrush 03/26/2014  . Breast cancer of lower-outer quadrant of left female breast ([place]) 01/12/2014    Allyson Sabal [facility] 07/02/2017, 12:41 PM  [place], [name], [address] Phone: [phone]   Fax:  [phone]  Name: Monica [name] MRN: [id number] Date of Birth: 1945/09/22  Manus Gunning, PT 07/02/17 12:41 PM

## 2017-07-06 ENCOUNTER — Inpatient Hospital Stay: Payer: Medicare HMO | Attending: Oncology

## 2017-07-06 ENCOUNTER — Encounter: Payer: Self-pay | Admitting: Radiation Oncology

## 2017-07-06 DIAGNOSIS — C50512 Malignant neoplasm of lower-outer quadrant of left female breast: Secondary | ICD-10-CM | POA: Insufficient documentation

## 2017-07-06 DIAGNOSIS — Z923 Personal history of irradiation: Secondary | ICD-10-CM | POA: Diagnosis not present

## 2017-07-06 DIAGNOSIS — Z9221 Personal history of antineoplastic chemotherapy: Secondary | ICD-10-CM | POA: Diagnosis not present

## 2017-07-06 DIAGNOSIS — C50211 Malignant neoplasm of upper-inner quadrant of right female breast: Secondary | ICD-10-CM

## 2017-07-06 DIAGNOSIS — Z79811 Long term (current) use of aromatase inhibitors: Secondary | ICD-10-CM | POA: Diagnosis not present

## 2017-07-06 DIAGNOSIS — Z170 Estrogen receptor positive status [ER+]: Secondary | ICD-10-CM

## 2017-07-06 LAB — CBC WITH DIFFERENTIAL/PLATELET
BASOS PCT: 1 %
Basophils Absolute: 0 10*3/uL (ref 0.0–0.1)
EOS ABS: 0.2 10*3/uL (ref 0.0–0.5)
Eosinophils Relative: 5 %
HEMATOCRIT: 39.3 % (ref 34.8–46.6)
HEMOGLOBIN: 12.9 g/dL (ref 11.6–15.9)
Lymphocytes Relative: 22 %
Lymphs Abs: 0.7 10*3/uL — ABNORMAL LOW (ref 0.9–3.3)
MCH: 29.3 pg (ref 25.1–34.0)
MCHC: 32.8 g/dL (ref 31.5–36.0)
MCV: 89.3 fL (ref 79.5–101.0)
MONOS PCT: 8 %
Monocytes Absolute: 0.3 10*3/uL (ref 0.1–0.9)
NEUTROS ABS: 2.1 10*3/uL (ref 1.5–6.5)
NEUTROS PCT: 64 %
Platelets: 257 10*3/uL (ref 145–400)
RBC: 4.4 MIL/uL (ref 3.70–5.45)
RDW: 14.3 % (ref 11.2–16.1)
WBC: 3.3 10*3/uL — ABNORMAL LOW (ref 3.9–10.3)

## 2017-07-06 LAB — COMPREHENSIVE METABOLIC PANEL
ALBUMIN: 3.3 g/dL — AB (ref 3.5–5.0)
ALK PHOS: 97 U/L (ref 40–150)
ALT: 17 U/L (ref 0–55)
AST: 16 U/L (ref 5–34)
Anion gap: 6 (ref 3–11)
BILIRUBIN TOTAL: 0.3 mg/dL (ref 0.2–1.2)
BUN: 16 mg/dL (ref 7–26)
CALCIUM: 9.7 mg/dL (ref 8.4–10.4)
CO2: 29 mmol/L (ref 22–29)
Chloride: 104 mmol/L (ref 98–109)
Creatinine, Ser: 0.87 mg/dL (ref 0.60–1.10)
GFR calc Af Amer: >60 mL/min (ref >60)
GFR calc non Af Amer: >60 mL/min (ref >60)
GLUCOSE: 90 mg/dL (ref 70–140)
Potassium: 3.8 mmol/L (ref 3.3–4.7)
SODIUM: 139 mmol/L (ref 136–145)
TOTAL PROTEIN: 7.1 g/dL (ref 6.4–8.3)

## 2017-07-06 NOTE — Progress Notes (Signed)
  Radiation Oncology         [phone] ________________________________  Name: Monica [name] MRN: [id number]  Date: 07/06/2017  DOB: 19-Nov-1945  End of Treatment Note  Diagnosis: ER/PR positive DCIS of the right breast.     Indication for treatment:  Curative       Radiation treatment dates:  06/02/17-07/01/17  Site/dose:  Right breast/ 42.5 Gy in 17 fractions  Beams/energy:  3D/ 10X, 6X  Narrative: The patient tolerated radiation treatment relatively well. The patient had hyperpigmentation in the treatment area without moist desquamation.   Plan: The patient has completed radiation treatment. The patient will return to radiation oncology clinic for routine followup in one month. I advised them to call or return sooner if they have any questions or concerns related to their recovery or treatment.  ------------------------------------------------  Jodelle Gross, MD, PhD  This document serves as a record of services personally performed by Kyung Rudd, MD. It was created on his behalf by Bethann Humble, a trained medical scribe. The creation of this record is based on the scribe's personal observations and the provider's statements to them. This document has been checked and approved by the attending provider.

## 2017-07-07 LAB — VITAMIN D 25 HYDROXY (VIT D DEFICIENCY, FRACTURES): VIT D 25 HYDROXY: 57 ng/mL (ref 30.0–100.0)

## 2017-07-08 DIAGNOSIS — M1712 Unilateral primary osteoarthritis, left knee: Secondary | ICD-10-CM | POA: Diagnosis not present

## 2017-07-08 DIAGNOSIS — M1711 Unilateral primary osteoarthritis, right knee: Secondary | ICD-10-CM | POA: Diagnosis not present

## 2017-07-12 ENCOUNTER — Encounter: Payer: Self-pay | Admitting: Physical Therapy

## 2017-07-12 ENCOUNTER — Ambulatory Visit: Payer: Medicare HMO | Admitting: Physical Therapy

## 2017-07-12 DIAGNOSIS — R293 Abnormal posture: Secondary | ICD-10-CM | POA: Diagnosis not present

## 2017-07-12 DIAGNOSIS — M542 Cervicalgia: Secondary | ICD-10-CM

## 2017-07-12 DIAGNOSIS — M25611 Stiffness of right shoulder, not elsewhere classified: Secondary | ICD-10-CM | POA: Diagnosis not present

## 2017-07-12 NOTE — Therapy (Signed)
[place], [place], [address] Phone: [phone]   Fax:  [phone]  Physical Therapy Treatment  Patient Details  Name: Monica [name] MRN: [id number] Date of Birth: 07-25-1945 Referring Provider: Dr. Lisbeth [name]   Encounter Date: 07/12/2017  PT End of Session - 07/12/17 1516    Visit Number  2    Number of Visits  9    Date for PT Re-Evaluation  07/30/17    PT Start Time  1438 pt arrived late    PT Stop Time  1517    PT Time Calculation (min)  39 min    Activity Tolerance  Patient tolerated treatment well    Behavior During Therapy  Monica [name] for tasks assessed/performed       Past Medical History:  Diagnosis Date  . Allergy codeine   rapid heart beat, cold sweat  . Arthritis   . Breast cancer ([place]) 01/10/14   Left breast INVASIVE DUCTAL CARCINOMA STAGE 2  . Hypertension   . Hyperthyroidism    medication called in today for hyperthroid  . Personal history of chemotherapy 2015   Left Breast Cancer  . Personal history of radiation therapy 2015   Left Breast Cancer  . PONV (postoperative nausea and vomiting)   . S/P radiation therapy 09/21/14 completed   left breast    Past Surgical History:  Procedure Laterality Date  . ABDOMINAL HYSTERECTOMY  1992  . BREAST LUMPECTOMY Left 02/12/2014  . BREAST LUMPECTOMY WITH NEEDLE LOCALIZATION AND AXILLARY SENTINEL LYMPH NODE BX Left 02/12/2014   Procedure: LEFT BREAST LUMPECTOMY WITH NEEDLE LOCALIZATION AND LEFT AXILLARY SENTINEL LYMPH NODE BX;  Surgeon: Monica Jolly, MD;  Location: [facility] [facility];  Service: General;  Laterality: Left;  . BREAST LUMPECTOMY WITH RADIOACTIVE SEED LOCALIZATION Right 04/02/2017   Procedure: BREAST LUMPECTOMY WITH RADIOACTIVE SEED LOCALIZATION;  Surgeon: Monica Seltzer, MD;  Location: [facility];  Service: General;  Laterality: Right;  . COLONOSCOPY    . FOOT ARTHROPLASTY  1998   right  . KNEE  ARTHROSCOPY  2012   right  . LUMBAR LAMINECTOMY  1991  . PORTACATH PLACEMENT Right 02/12/2014   Procedure: INSERTION PORT-A-CATH;  Surgeon: Monica Jolly, MD;  Location: [facility];  Service: General;  Laterality: Right;    There were no vitals filed for this visit.  Subjective Assessment - 07/12/17 1441    Subjective  That spot is not hurting as bad but it is there.     Pertinent History  From Monica [name]' note:  "Ms. Monica [name] is a pleasant 72 y.o. female who was diagnosed with essentially triple negative left breast cancer in 2015. She underwent lumpectomy 02/12/14 with sentinel lymph node evaluation followed by adjuvant chemotherapy and adjuvant radiotherapy to the breast under the care of Dr. Lisbeth [name]." On 04/02/17 she had right lumpectomy for ER/PR positive DCIS; she expects to have 4 weeks of XRT for this. Arthroscopy right knee 11/03/10 for debridement.  Lumbar laminectomy 07/30/89.  Hysterectomy 1992.  Nerve tumor Rt. foot 1998.  Expects to have TKA in 2019.  HTN controlled. Hypothyroid and on meds, which have recently been adjusted due to fatigue.  Pt. says she has been getting epidurals for the last two years, prn, every 3-6 months. Pt. says she had some fluid on right breast and goes back next week to surgeon to see if she needs aspiration. pt has now completed radiation for her right breast    Patient  Stated Goals  to get this knot out of my neck and to get some relief and to be able to move my neck without pain    Currently in Pain?  No/denies    Pain Score  0-No pain                      [facility] Adult PT Treatment/Exercise - 07/12/17 0001      Manual Therapy   Manual Therapy  Soft tissue mobilization    Soft tissue mobilization  to right upper trap, rhomboids, paraspinals and cervical muscles to help decrease pain- the knot that pt originally came for feels more like a lipoma and less like muscle- pt to see her primary care physician about that                   PT Long Term Goals - 07/02/17 1237      PT LONG TERM GOAL #1   Title  Pt will report a 75% reduction in cervical pain to allow pt to look over shoulder with less discomfort    Time  4    Period  Weeks    Status  New    Target Date  07/30/17      PT LONG TERM GOAL #2   Title  Pt will be independent in a home exercise program for continued strengthening and stretching    Time  4    Period  Weeks    Status  New    Target Date  07/30/17      PT LONG TERM GOAL #3   Title  Pt will demonstrate full cervical ROM to allow pt to look up and over shoulder without limitation and pain    Baseline  25% loss    Time  4    Status  New    Target Date  07/30/17      PT LONG TERM GOAL #4   Title  Pt will decrease score on QuickDash to less than 20 to allow improved RUE function    Baseline  45.5    Time  4    Period  Days    Status  New    Target Date  07/30/17        Long Term Clinic Goals - 06/16/17 1252      [place] Term Goal  #1   Title  Patient will be independent in HEP for low back flexibility and strengthening, and core strengthening.    Status  Achieved      CC Long Term Goal  #2   Title  Patient will report at least 50% decrease in low back pain and pain that radiates from low back.    Baseline  20-30% bette as of 05/28/17; almost 50% as of 06/11/17, pt reports more than 50% imiproved     Status  Achieved      CC Long Term Goal  #3   Title  Quick DASH score will improve to 25 or less.    Baseline  38.64 at eval, 36.36 on 06/16/2017    Status  Partially Met         Plan - 07/12/17 1522    Clinical Impression Statement  Performed soft tissue mobilization to pt's right upper traps, rhomboids, levator scapula, paraspinals and cervical musculature to help decrease neck pain. Pt has a large lump just lateral to lower cervical spine on right that she thought was from muscle tightness. This had  a very soft texture and clear borders and feels more like  a lipoma and less like a muscle. Pt to follow up with primary care doctor about this.     Rehab Potential  Good    Clinical Impairments Affecting Rehab Potential  needs bilateral knee replacements    PT Frequency  2x / week    PT Duration  4 weeks    PT Treatment/Interventions  ADLs/Self Care Home Management;Cryotherapy;Electrical Stimulation;Moist Heat;Therapeutic exercise;Patient/family education;Manual techniques;Passive range of motion;Therapeutic activities;Taping    PT Next Visit Plan  assess goals, give gentle neck ROM exercises, evenutally give shoulder ROM and strengthening     Consulted and Agree with Plan of Care  Patient       Patient will benefit from skilled therapeutic intervention in order to improve the following deficits and impairments:  Pain, Decreased range of motion, Decreased mobility, Decreased strength  Visit Diagnosis: Cervicalgia     Problem List Patient Active Problem List   Diagnosis Date Noted  . Malignant neoplasm of upper-inner quadrant of right breast in female, estrogen receptor positive ([place]) 02/24/2017  . Hot flashes 04/05/2015  . Other pancytopenia ([place]) 06/28/2014  . Corneal abrasion 06/27/2014  . Anemia associated with chemotherapy 06/25/2014  . Thrombocytopenia ([place]) 06/25/2014  . Chemotherapy-induced neuropathy ([place]) 06/04/2014  . Decreased appetite 05/28/2014  . Body aches 05/28/2014  . Dyspepsia 05/28/2014  . Insomnia 05/02/2014  . UTI (urinary tract infection) 04/17/2014  . Febrile neutropenia ([place]) 04/15/2014  . Hypokalemia 04/15/2014  . Hypertension 04/15/2014  . Itchy eyes 04/09/2014  . Heart palpitations 04/04/2014  . Nausea without vomiting 03/26/2014  . Heartburn 03/26/2014  . Thrush 03/26/2014  . Breast cancer of lower-outer quadrant of left female breast ([place]) 01/12/2014    Allyson Sabal [facility] 07/12/2017, 3:24 PM  [place], [place],  [address] Phone: [phone]   Fax:  [phone]  Name: Monica [name] MRN: [id number] Date of Birth: January 19, 1946  Manus Gunning, PT 07/12/17 3:24 PM

## 2017-07-13 ENCOUNTER — Inpatient Hospital Stay (HOSPITAL_BASED_OUTPATIENT_CLINIC_OR_DEPARTMENT_OTHER): Payer: Medicare HMO | Admitting: Oncology

## 2017-07-13 ENCOUNTER — Telehealth: Payer: Self-pay | Admitting: Oncology

## 2017-07-13 VITALS — BP 147/83 | HR 73 | Temp 98.2°F | Resp 18 | Ht 66.5 in | Wt 221.3 lb

## 2017-07-13 DIAGNOSIS — Z923 Personal history of irradiation: Secondary | ICD-10-CM | POA: Diagnosis not present

## 2017-07-13 DIAGNOSIS — Z9221 Personal history of antineoplastic chemotherapy: Secondary | ICD-10-CM

## 2017-07-13 DIAGNOSIS — Z79811 Long term (current) use of aromatase inhibitors: Secondary | ICD-10-CM | POA: Diagnosis not present

## 2017-07-13 DIAGNOSIS — G620 Drug-induced polyneuropathy: Secondary | ICD-10-CM

## 2017-07-13 DIAGNOSIS — D61818 Other pancytopenia: Secondary | ICD-10-CM

## 2017-07-13 DIAGNOSIS — Z170 Estrogen receptor positive status [ER+]: Secondary | ICD-10-CM

## 2017-07-13 DIAGNOSIS — D696 Thrombocytopenia, unspecified: Secondary | ICD-10-CM

## 2017-07-13 DIAGNOSIS — C50211 Malignant neoplasm of upper-inner quadrant of right female breast: Secondary | ICD-10-CM

## 2017-07-13 DIAGNOSIS — C50512 Malignant neoplasm of lower-outer quadrant of left female breast: Secondary | ICD-10-CM

## 2017-07-13 DIAGNOSIS — T451X5A Adverse effect of antineoplastic and immunosuppressive drugs, initial encounter: Secondary | ICD-10-CM

## 2017-07-13 MED ORDER — ANASTROZOLE 1 MG PO TABS: 1.0000 mg | ORAL_TABLET | Freq: Every day | ORAL | 4 refills | Status: DC

## 2017-07-13 NOTE — Progress Notes (Signed)
[place]  [phone]) [phone] Fax:([phone]) [phone]     ID: Monica [name] DOB: Jun 16, 1946  MR#: [id number]  OVZ#:[id number]  Patient Care Team: Maury Dus, MD as PCP - General (Family Medicine) Excell Seltzer, MD as Consulting Physician (General Surgery) Magrinat, Monica Dad, MD as Consulting Physician (Oncology) Kyung Rudd, MD as Consulting Physician (Radiation Oncology) Maisie Fus, MD as Consulting Physician (Obstetrics and Gynecology) Kennith Center, RD as Dietitian (Family Medicine) Marlaine Hind, MD as Consulting Physician (Physical Medicine and Rehabilitation)   CHIEF COMPLAINT: Essentially triple negative breast cancer (very weak estrogen positivity). Her  CURRENT TREATMENT: Anastrozole  BREAST CANCER HISTORY: From the original intake note:  [name] showing a possible mass in her left breast. On 01/05/2014 left diagnostic mammography and ultrasonography at the breast Center showed 2 adjacent poorly defined masses deep in the lower outer portion of the left breast. On physical exam these were palpable measuring approximately 3 cm altogether, at the 4:00 position 16 cm from the left nipple. There were no palpable left axillary lymph nodes. Ultrasound confirmed to and adjacent irregularly marginated hypoechoic masses in the area in question, measuring 1.5 and 1.0 cm respectively. The total area of by ultrasonography measured 2.6 cm. In addition, to left axillary lymph nodes showed mild cortical thickening in one of them showed loss of the normal fatty hilum.  On 01/10/2014 the patient underwent separate biopsies of both the left breast masses in question as well as the more suspicious lymph node. The final pathology ([id number]) showed the lymph node to be benign. (This was interpreted as concordant). Both of the breast masses, however, were positive for an invasive ductal carcinoma, which was  HER-2 not amplified, with the signals ratio of 0.98 and the number per cell being 2.45. Estrogen and progesterone receptor determination is pending but looks negative at this point.  On 01/16/2014 the patient underwent bilateral breast MRI the right breast was unremarkable. In the left breast lower outer quadrant there were 2 oval masses measuring 1.5 and 2.2 cm. Taken together they was approximately 4 cm of disease. There were no findings of concern in the right breast. In the left axilla there was a single mildly prominent lymph node. There was no suspicious internal mammary adenopathy.  The patient's subsequent history is as detailed below  INTERVAL HISTORY: Monica [name] returns today for a follow-up of her weekly estrogen receptor positive breast cancer.  She continues on anastrozole, which she tolerates well for the most part.   Since her last visit here she completed her radiation treatments (last dose 07/01/2017). She states that it burnt her, but other wise she was good. Intermittent shooting pains in the affected breast have improved.   REVIEW OF SYSTEMS: Monica [name] reports continuing to feel fatigued. She has been going to rehab and states that they have been working on her neck and have found Lipoma to her upper right back. She experiences hot flashes which only last about a minute. She states they will come on after eating or drinking certain things, like chocolate. Occasionally the hot flashes will wake her up at night but Monica [name] notes she is able to fall back a sleep with out any issues. She also endorses vaginal dryness and is interested in pelvic health classes. She denies unusual headaches, visual changes, nausea, vomiting, or dizziness. There has been no unusual cough, phlegm production, or pleurisy. This been no change in bowel or bladder habits. She denies unexplained  weight loss, bleeding, rash, or fever. A detailed review of systems was otherwise noncontributory.    PAST MEDICAL  HISTORY: Past Medical History:  Diagnosis Date  . Allergy codeine   rapid heart beat, cold sweat  . Arthritis   . Breast cancer ([place]) 01/10/14   Left breast INVASIVE DUCTAL CARCINOMA STAGE 2  . Hypertension   . Hyperthyroidism    medication called in today for hyperthroid  . Personal history of chemotherapy 2015   Left Breast Cancer  . Personal history of radiation therapy 2015   Left Breast Cancer  . PONV (postoperative nausea and vomiting)   . S/P radiation therapy 09/21/14 completed   left breast    PAST SURGICAL HISTORY: Past Surgical History:  Procedure Laterality Date  . ABDOMINAL HYSTERECTOMY  1992  . BREAST LUMPECTOMY Left 02/12/2014  . BREAST LUMPECTOMY WITH NEEDLE LOCALIZATION AND AXILLARY SENTINEL LYMPH NODE BX Left 02/12/2014   Procedure: LEFT BREAST LUMPECTOMY WITH NEEDLE LOCALIZATION AND LEFT AXILLARY SENTINEL LYMPH NODE BX;  Surgeon: Monica Jolly, MD;  Location: [facility];  Service: General;  Laterality: Left;  . BREAST LUMPECTOMY WITH RADIOACTIVE SEED LOCALIZATION Right 04/02/2017   Procedure: BREAST LUMPECTOMY WITH RADIOACTIVE SEED LOCALIZATION;  Surgeon: Excell Seltzer, MD;  Location: [facility];  Service: General;  Laterality: Right;  . COLONOSCOPY    . FOOT ARTHROPLASTY  1998   right  . KNEE ARTHROSCOPY  2012   right  . LUMBAR LAMINECTOMY  1991  . PORTACATH PLACEMENT Right 02/12/2014   Procedure: INSERTION PORT-A-CATH;  Surgeon: Monica Jolly, MD;  Location: [facility];  Service: General;  Laterality: Right;    FAMILY HISTORY Family History  Problem Relation Age of Onset  . Cancer Sister   . Cancer Brother   . Cancer Maternal Aunt   . Cancer Paternal Aunt    the patient's father died at the age of 73 from a stroke. The patient's mother died at the age of 3 from "multiple causes". The patient had 2 brothers and 2 sisters. The patient's sister was diagnosed with uterine cancer at the age of 33,  and the patient's brother Monica [name] had "bone cancer" and died in his 81s. He was treated at the [facility]. There is no history of breast or ovarian cancer in the family to the patient's knowledge.  GYNECOLOGIC HISTORY:  No LMP recorded. Patient has had a hysterectomy. Menarche age 68, the patient is GX P0. She underwent hysterectomy in 1992, with no salpingo-oophorectomy. She has been on estrogen replacement since. 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 Her husband Monica [name] worked for [employer] in [place] as a Facilities manager. The patient's adopted son Monica [name] works as a laborer in [name]. The patient's god-daughter Monica [name] is an Optometrist. The patient has no grandchildren. The patient attends a local church of  [employer], and her husband Monica [name] is pastor   ADVANCED DIRECTIVES: In place   HEALTH MAINTENANCE: Social History   Tobacco Use  . Smoking status: Never Smoker  . Smokeless tobacco: Never Used  Substance Use Topics  . Alcohol use: No  . Drug use: No     Colonoscopy: October 2008  PAP: July 2015  Bone density: 01/01/2014  Lipid panel:  Allergies  Allergen Reactions  . Codeine Other (See Comments)    Heart fast  .  Adhesive [Tape]     Current Outpatient Medications  Medication Sig Dispense Refill  . amLODipine (NORVASC) 5 MG tablet Take 5 mg by mouth daily.    [name] anastrozole (ARIMIDEX) 1 MG tablet Take 1 tablet (1 mg total) daily by mouth. 90 tablet 4  . Cholecalciferol (VITAMIN D3) 2000 UNITS TABS Take 1 tablet by mouth daily.    . cloNIDine (CATAPRES-TTS-1) 0.1 mg/24hr patch Place 1 patch (0.1 mg total) onto the skin once a week. 12 patch 3  . diclofenac sodium (VOLTAREN) 1 % GEL Apply 2 g topically 4 (four) times daily.    [name] docusate sodium (COLACE) 100 MG capsule Take 100 mg by mouth daily as needed for  mild constipation.     . gabapentin (NEURONTIN) 300 MG capsule Take 1 capsule (300 mg total) by mouth at bedtime. 90 capsule 0  . Ginkgo Biloba 120 MG CAPS Take 1 capsule by mouth daily.    [name] levothyroxine (SYNTHROID, LEVOTHROID) 75 MCG tablet Take 75 mcg by mouth daily before breakfast.    . meloxicam (MOBIC) 15 MG tablet Take 15 mg by mouth daily.    . Omega-3 Fatty Acids (FISH OIL CONCENTRATE) 300 MG CAPS Take by mouth.    . oxyCODONE-acetaminophen (PERCOCET/ROXICET) 5-325 MG tablet Take 1 tablet by mouth every 4 (four) hours as needed for severe pain.    . potassium chloride SA (K-DUR,KLOR-CON) 20 MEQ tablet Take 20 mEq by mouth once.    . valsartan-hydrochlorothiazide (DIOVAN-HCT) 320-25 MG per tablet Take 1 tablet by mouth daily.     No current facility-administered medications for this visit.     OBJECTIVE: Middle-aged [place] American woman in no acute distress  Vitals:   07/13/17 1014  BP: (!) 147/83  Pulse: 73  Resp: 18  Temp: 98.2 F (36.8 C)  SpO2: 100%     Body mass index is 35.18 kg/m.    ECOG FS:1 - Symptomatic but completely ambulatory  Sclerae unicteric, pupils round and equal Oropharynx clear and moist No cervical or supraclavicular adenopathy Lungs no rales or rhonchi Heart regular rate and rhythm Abd soft, nontender, positive bowel sounds MSK no focal spinal tenderness, no upper extremity lymphedema Neuro: nonfocal, well oriented, appropriate affect Breasts: The right breast is status post lumpectomy and radiation.  There is hyperpigmentation over the radiation port area.  There is some dry desquamation particularly in the right axilla and the inframammary fold.  Otherwise the cosmetic result is excellent.  The left breast is unremarkable.  Both axillae are benign.  LAB RESULTS:  CMP     Component Value Date/Time   NA 139 07/06/2017 0920   NA 142 04/26/2017 1126   K 3.8 07/06/2017 0920   K 4.0 04/26/2017 1126   CL 104 07/06/2017 0920   CO2 29  07/06/2017 0920   CO2 28 04/26/2017 1126   GLUCOSE 90 07/06/2017 0920   GLUCOSE 90 04/26/2017 1126   BUN 16 07/06/2017 0920   BUN 18.0 04/26/2017 1126   CREATININE 0.87 07/06/2017 0920   CREATININE 0.9 04/26/2017 1126   CALCIUM 9.7 07/06/2017 0920   CALCIUM 9.9 04/26/2017 1126   PROT 7.1 07/06/2017 0920   PROT 7.5 04/26/2017 1126   ALBUMIN 3.3 (L) 07/06/2017 0920   ALBUMIN 3.6 04/26/2017 1126   AST 16 07/06/2017 0920   AST 21 04/26/2017 1126   ALT 17 07/06/2017 0920   ALT 15 04/26/2017 1126   ALKPHOS 97 07/06/2017 0920   ALKPHOS 99 04/26/2017 1126   BILITOT  0.3 07/06/2017 0920   BILITOT 0.52 04/26/2017 1126   GFRNONAA >60 07/06/2017 0920   GFRAA >60 07/06/2017 0920    I No results found for: SPEP  Lab Results  Component Value Date   WBC 3.3 (L) 07/06/2017   NEUTROABS 2.1 07/06/2017   HGB 12.9 07/06/2017   HCT 39.3 07/06/2017   MCV 89.3 07/06/2017   PLT 257 07/06/2017      Chemistry      Component Value Date/Time   NA 139 07/06/2017 0920   NA 142 04/26/2017 1126   K 3.8 07/06/2017 0920   K 4.0 04/26/2017 1126   CL 104 07/06/2017 0920   CO2 29 07/06/2017 0920   CO2 28 04/26/2017 1126   BUN 16 07/06/2017 0920   BUN 18.0 04/26/2017 1126   CREATININE 0.87 07/06/2017 0920   CREATININE 0.9 04/26/2017 1126      Component Value Date/Time   CALCIUM 9.7 07/06/2017 0920   CALCIUM 9.9 04/26/2017 1126   ALKPHOS 97 07/06/2017 0920   ALKPHOS 99 04/26/2017 1126   AST 16 07/06/2017 0920   AST 21 04/26/2017 1126   ALT 17 07/06/2017 0920   ALT 15 04/26/2017 1126   BILITOT 0.3 07/06/2017 0920   BILITOT 0.52 04/26/2017 1126       No results found for: LABCA2  No components found for: LABCA125  No results for input(s): INR in the last 168 hours.  Urinalysis    Component Value Date/Time   COLORURINE AMBER (A) 01/16/2016 1751    STUDIES: She will be due for bilateral mammography again in August of this year  ASSESSMENT: 72 y.o. [place] woman status post  left breast lower outer quadrant biopsy of 2 separate lower outer quadrant masses 01/10/2014 for a multifocal cT2 cNX, stage II invasive ductal carcinoma, grade 3, with an MIB-1 of 90%, HER-2 negative, estrogen receptors 3% positive with weak staining, progesterone receptor negative, with an MIB-1 of 90%; this is clinically a "triple-negative" breast cancer and will be treated as such  (1) a suspicious left axillary lymph node biopsied 01/10/2014 was negative (concordant)  (2) left lumpectomy and sentinel lymph node sampling 02/12/2014 showed an mpT2 pN0, stage IIA invasive ductal carcinoma grade 3, repeat HER-2 again negative  (3) adjuvant chemotherapy consisted of  (a) dose dense doxorubicin and cyclophosphamide x4 completed 05/02/2014  (b) weekly paclitaxel and carboplatin with 3 of 12 doses given, then stopped because of neuropathy  (c) Carboplatin and Gemzar started 06/11/2014, given on day 1 & day 8 of a 21 days cycle for 3 cycles, completed 07/30/2014  (4) adjuvant radiation 08/27/2014 through 09/21/2014:  The patient initially received a dose of 42.5 Gy in 17 fractions to the left breast using whole-breast tangent fields. This was delivered using a 3-D conformal technique. The patient then received a boost to the seroma. This delivered an additional 7.5 Gy in 3 fractions using a 3 field photon technique due to the depth of the seroma. The total dose was 50 Gy.  (5) tamoxifen started (prophylactically) in December 2016; stopped after 2 weeks due to side effects.   (a) given the very weak and low estrogen receptor positivity, resumption of anti-estrogens not anticipated  (6) right breast upper outer quadrant biopsy 04/16/2016 showed no evidence of malignancy  (7) right lumpectomy April 02, 2017 showed ductal carcinoma in situ, grade 1, measuring 0.2 cm, with negative margins.  This was estrogen receptor 100% positive and progesterone receptor 95% positive.  (8) anastrozole started  05/03/2017  (  a) bone density at the breast center 09/18/2016 showed a T score of 0 point  (9) adjuvant radiation12/12/18-01/10/19 Site/dose:  Right breast/ 42.5 Gy in 17 fractions    PLAN:  Monica [name] is done with her radiation and more generally with her local treatments.  Of course she still has to complete healing from the radiation and needs to recover her energy and functional status.  In my experience it that will take another 2-3 months.  I encouraged her to continue her rehab and I also added the pelvic health program to her rehab plan.  She is experiencing significant vaginal dryness partly due to menopause and partly due to the anastrozole.  Hopefully that will be helpful in that regard.  The plan will be to continue anastrozole for a total of 5 years.  She has an excellent bone density at baseline.  Once she has her knees done by Dr. Reynaldo Minium she will be able to do more walking (she has 1 knee scheduled for April, the other once she fully).  I went ahead and entered her order for mammography in October.  I am going to see her in May and then again in November of this year after which I may start seeing her on a once a year basis.  She knows to call for any other issues that may develop before her next visit here.  Magrinat, Monica Dad, MD  07/13/17 10:25 AM Medical Oncology and Hematology [facility] [address] [name], [place] [address] Tel. [phone]    Fax. [phone]  This document serves as a record of services personally performed by Chauncey Cruel, MD. It was created on his behalf by Margit Banda, a trained medical scribe. The creation of this record is based on the scribe's personal observations and the provider's statements to them.   I have reviewed the above documentation for accuracy and completeness, and I agree with the above.

## 2017-07-13 NOTE — Telephone Encounter (Signed)
Gave patient AVS and calendar of upcoming April and November appointments.

## 2017-07-14 ENCOUNTER — Ambulatory Visit: Payer: Medicare HMO | Admitting: Physical Therapy

## 2017-07-14 ENCOUNTER — Encounter: Payer: Self-pay | Admitting: Physical Therapy

## 2017-07-14 DIAGNOSIS — R293 Abnormal posture: Secondary | ICD-10-CM

## 2017-07-14 DIAGNOSIS — M25611 Stiffness of right shoulder, not elsewhere classified: Secondary | ICD-10-CM | POA: Diagnosis not present

## 2017-07-14 DIAGNOSIS — M542 Cervicalgia: Secondary | ICD-10-CM | POA: Diagnosis not present

## 2017-07-14 NOTE — Progress Notes (Signed)
Simulation verification  The patient was brought to the treatment machine and placed in the plan treatment position.  Clinical set up was verified to ensure that the target region is appropriately covered for the patient's upcoming electron boost treatment.  The targeted volume of tissue is appropriately covered by the radiation field.  Based on my personal review, I approve the simulation verification.  The patient's treatment will proceed as planned.  ------------------------------------------------  Jodelle Gross, MD, PhD

## 2017-07-14 NOTE — Patient Instructions (Signed)
Shoulder: Flexion (Supine)    With hands shoulder width apart, slowly lower dowel to floor behind head. Do not let elbows bend. Keep back flat. Hold __10-15__ seconds. Repeat __10__ times. Do __1__ sessions per day. CAUTION: Stretch slowly and gently.  Copyright  VHI. All rights reserved.  Shoulder: Abduction (Supine)    With right arm flat on floor, hold dowel in palm. Slowly move arm up to side of head by pushing with opposite arm. Do not let elbow bend. Hold _10-15___ seconds. Repeat __10__ times. Do __1__ sessions per day. Repeat on opposite side.  CAUTION: Stretch slowly and gently.  Copyright  VHI. All rights reserved.   Over Head Pull: Narrow and Wide Grip   [facility] [phone]   On back, knees bent, feet flat, band across thighs, elbows straight but relaxed. Pull hands apart (start). Keeping elbows straight, bring arms up and over head, hands toward floor. Keep pull steady on band. Hold momentarily. Return slowly, keeping pull steady, back to start. Then do same with a wider grip on the band (past shoulder width) Repeat _10__ times. Band color __yellow____   Side Pull: Double Arm   On back, knees bent, feet flat. Arms perpendicular to body, shoulder level, elbows straight but relaxed. Pull arms out to sides, elbows straight. Resistance band comes across collarbones, hands toward floor. Hold momentarily. Slowly return to starting position. Repeat _10__ times. Band color _yellow____   Sword   On back, knees bent, feet flat, left hand on left hip, right hand above left. Pull right arm DIAGONALLY (hip to shoulder) across chest. Bring right arm along head toward floor. Hold momentarily. Slowly return to starting position. Repeat _10__ times. Do with left arm. Band color _yellow_____   Shoulder Rotation: Double Arm   On back, knees bent, feet flat, elbows tucked at sides, bent 90, hands palms up. Pull hands apart and down toward floor, keeping elbows near sides. Hold  momentarily. Slowly return to starting position. Repeat _10__ times. Band color __yellow____

## 2017-07-14 NOTE — Therapy (Signed)
[place], [place], [address] Phone: [phone]   Fax:  [phone]  Physical Therapy Treatment  Patient Details  Name: Monica [name] MRN: [id number] Date of Birth: 08/07/1945 Referring Provider: Dr. Lisbeth Renshaw   Encounter Date: 07/14/2017  PT End of Session - 07/14/17 1210    Visit Number  3    Number of Visits  9    Date for PT Re-Evaluation  07/30/17    PT Start Time  0935    PT Stop Time  1016    PT Time Calculation (min)  41 min    Activity Tolerance  Patient tolerated treatment well    Behavior During Therapy  [facility] for tasks assessed/performed       Past Medical History:  Diagnosis Date  . Allergy codeine   rapid heart beat, cold sweat  . Arthritis   . Breast cancer ([place]) 01/10/14   Left breast INVASIVE DUCTAL CARCINOMA STAGE 2  . Hypertension   . Hyperthyroidism    medication called in today for hyperthroid  . Personal history of chemotherapy 2015   Left Breast Cancer  . Personal history of radiation therapy 2015   Left Breast Cancer  . PONV (postoperative nausea and vomiting)   . S/P radiation therapy 09/21/14 completed   left breast    Past Surgical History:  Procedure Laterality Date  . ABDOMINAL HYSTERECTOMY  1992  . BREAST LUMPECTOMY Left 02/12/2014  . BREAST LUMPECTOMY WITH NEEDLE LOCALIZATION AND AXILLARY SENTINEL LYMPH NODE BX Left 02/12/2014   Procedure: LEFT BREAST LUMPECTOMY WITH NEEDLE LOCALIZATION AND LEFT AXILLARY SENTINEL LYMPH NODE BX;  Surgeon: [name], MD;  Location: [place];  Service: General;  Laterality: Left;  . BREAST LUMPECTOMY WITH RADIOACTIVE SEED LOCALIZATION Right 04/02/2017   Procedure: BREAST LUMPECTOMY WITH RADIOACTIVE SEED LOCALIZATION;  Surgeon: Excell Seltzer, MD;  Location: [place];  Service: General;  Laterality: Right;  . COLONOSCOPY    . FOOT ARTHROPLASTY  1998   right  . KNEE ARTHROSCOPY  2012    right  . LUMBAR LAMINECTOMY  1991  . PORTACATH PLACEMENT Right 02/12/2014   Procedure: INSERTION PORT-A-CATH;  Surgeon: [name], MD;  Location: [place];  Service: General;  Laterality: Right;    There were no vitals filed for this visit.  Subjective Assessment - 07/14/17 0939    Subjective  My shoulder felt really good. I went to see Dr. Jana Hakim yesterday and he said it felt like a lipoma. He told me not to bother with it right now. I turned my head on the couch and heard a really bad pop in my neck but it feels okay now. It has happened before.     Pertinent History  From Shona Simpson' note:  "[name] is a pleasant 72 y.o. female who was diagnosed with essentially triple negative left breast cancer in 2015. She underwent lumpectomy 02/12/14 with sentinel lymph node evaluation followed by adjuvant chemotherapy and adjuvant radiotherapy to the breast under the care of Dr. Lisbeth Renshaw." On 04/02/17 she had right lumpectomy for ER/PR positive DCIS; she expects to have 4 weeks of XRT for this. Arthroscopy right knee 11/03/10 for debridement.  Lumbar laminectomy 07/30/89.  Hysterectomy 1992.  Nerve tumor Rt. foot 1998.  Expects to have TKA in 2019.  HTN controlled. Hypothyroid and on meds, which have recently been adjusted due to fatigue.  Pt. says she has been getting epidurals for the last  two years, prn, every 3-6 months. Pt. says she had some fluid on right breast and goes back next week to surgeon to see if she needs aspiration. pt has now completed radiation for her right breast    Patient Stated Goals  to get this knot out of my neck and to get some relief and to be able to move my neck without pain    Currently in Pain?  Yes    Pain Score  9     Pain Location  Knee    Pain Orientation  Right;Left                      [facility] Adult PT Treatment/Exercise - 07/14/17 0001      Shoulder Exercises: Supine   Horizontal ABduction  Strengthening;Both;10  reps;Theraband    Theraband Level (Shoulder Horizontal ABduction)  Level 1 (Yellow)    External Rotation  Strengthening;Both;10 reps;Theraband    Theraband Level (Shoulder External Rotation)  Level 1 (Yellow)    Flexion  Strengthening;Both;10 reps;Theraband narrow and wide grip    Theraband Level (Shoulder Flexion)  Level 1 (Yellow)    Other Supine Exercises  AAROM with dowel with 10 sec holds x 10 reps bilaterally, bilateral shoulder abduction with dowel AAROM x 10 reps bilaterally with 10 sec holds    Other Supine Exercises  D2 bilaterally x 10 reps with yellow band                  PT Long Term Goals - 07/14/17 0944      PT LONG TERM GOAL #1   Title  Pt will report a 75% reduction in cervical pain to allow pt to look over shoulder with less discomfort    Baseline  07/14/17- 50% improvement    Time  4    Period  Weeks    Status  On-going      PT LONG TERM GOAL #2   Title  Pt will be independent in a home exercise program for continued strengthening and stretching    Time  4    Period  Weeks    Status  On-going      PT LONG TERM GOAL #3   Title  Pt will demonstrate full cervical ROM to allow pt to look up and over shoulder without limitation and pain    Baseline  25% loss, 07/14/17- 15% loss    Time  4    Status  On-going      PT LONG TERM GOAL #4   Title  Pt will decrease score on QuickDash to less than 20 to allow improved RUE function    Baseline  45.5    Time  4    Period  Days    Status  On-going        Long Term Clinic Goals - 06/16/17 1252      CC Long Term Goal  #1   Title  Patient will be independent in HEP for low back flexibility and strengthening, and core strengthening.    Status  Achieved      CC Long Term Goal  #2   Title  Patient will report at least 50% decrease in low back pain and pain that radiates from low back.    Baseline  20-30% bette as of 05/28/17; almost 50% as of 06/11/17, pt reports more than 50% imiproved     Status  Achieved       CC Long Term Goal  #  3   Title  Quick DASH score will improve to 25 or less.    Baseline  38.64 at eval, 36.36 on 06/16/2017    Status  Partially Met         Plan - 07/14/17 1211    Clinical Impression Statement  Assesed pt's progress towards goals in therapy. She is progressing towards her goals in therapy. She has decreased neck pain but is still limited with her ROM. She saw the doctor about the lump on her neck and he agreed that it appeared to be a lipoma. Instructed pt in shoulder strengthening exercises today using theraband and also shoulder ROM with dowel.     Rehab Potential  Good    Clinical Impairments Affecting Rehab Potential  needs bilateral knee replacements    PT Frequency  2x / week    PT Duration  4 weeks    PT Treatment/Interventions  ADLs/Self Care Home Management;Cryotherapy;Electrical Stimulation;Moist Heat;Therapeutic exercise;Patient/family education;Manual techniques;Passive range of motion;Therapeutic activities;Taping    PT Next Visit Plan  assess indep with supine scap and dowel exercises, shoulder ROM and strengthening -STM to neck to decrease pain and increase ROM    PT Home Exercise Plan  supine scap and dowel exercises    Consulted and Agree with Plan of Care  Patient       Patient will benefit from skilled therapeutic intervention in order to improve the following deficits and impairments:  Pain, Decreased range of motion, Decreased mobility, Decreased strength  Visit Diagnosis: Cervicalgia  Stiffness of right shoulder, not elsewhere classified  Abnormal posture     Problem List Patient Active Problem List   Diagnosis Date Noted  . Malignant neoplasm of upper-inner quadrant of right breast in female, estrogen receptor positive ([place]) 02/24/2017  . Hot flashes 04/05/2015  . Other pancytopenia ([place]) 06/28/2014  . Corneal abrasion 06/27/2014  . Anemia associated with chemotherapy 06/25/2014  . Thrombocytopenia ([place]) 06/25/2014  .  Chemotherapy-induced neuropathy ([place]) 06/04/2014  . Decreased appetite 05/28/2014  . Body aches 05/28/2014  . Dyspepsia 05/28/2014  . Insomnia 05/02/2014  . UTI (urinary tract infection) 04/17/2014  . Febrile neutropenia ([place]) 04/15/2014  . Hypokalemia 04/15/2014  . Hypertension 04/15/2014  . Itchy eyes 04/09/2014  . Heart palpitations 04/04/2014  . Nausea without vomiting 03/26/2014  . Heartburn 03/26/2014  . Thrush 03/26/2014  . Breast cancer of lower-outer quadrant of left female breast ([place]) 01/12/2014    Allyson Sabal [facility] 07/14/2017, 12:13 PM  [place], [place], [address] Phone: [phone]   Fax:  [phone]  Name: Monica [name] MRN: [id number] Date of Birth: September 30, 1945  Manus Gunning, PT 07/14/17 12:14 PM

## 2017-07-15 DIAGNOSIS — R69 Illness, unspecified: Secondary | ICD-10-CM | POA: Diagnosis not present

## 2017-07-15 DIAGNOSIS — Z6834 Body mass index (BMI) 34.0-34.9, adult: Secondary | ICD-10-CM | POA: Diagnosis not present

## 2017-07-15 DIAGNOSIS — Z79899 Other long term (current) drug therapy: Secondary | ICD-10-CM | POA: Diagnosis not present

## 2017-07-15 DIAGNOSIS — M47812 Spondylosis without myelopathy or radiculopathy, cervical region: Secondary | ICD-10-CM | POA: Diagnosis not present

## 2017-07-15 DIAGNOSIS — M5126 Other intervertebral disc displacement, lumbar region: Secondary | ICD-10-CM | POA: Diagnosis not present

## 2017-07-15 DIAGNOSIS — M5416 Radiculopathy, lumbar region: Secondary | ICD-10-CM | POA: Diagnosis not present

## 2017-07-15 DIAGNOSIS — I10 Essential (primary) hypertension: Secondary | ICD-10-CM | POA: Diagnosis not present

## 2017-07-21 ENCOUNTER — Encounter: Payer: Medicare HMO | Admitting: Physical Therapy

## 2017-07-21 DIAGNOSIS — M5416 Radiculopathy, lumbar region: Secondary | ICD-10-CM | POA: Diagnosis not present

## 2017-07-21 DIAGNOSIS — I10 Essential (primary) hypertension: Secondary | ICD-10-CM | POA: Diagnosis not present

## 2017-07-21 DIAGNOSIS — M5126 Other intervertebral disc displacement, lumbar region: Secondary | ICD-10-CM | POA: Diagnosis not present

## 2017-07-21 DIAGNOSIS — Z6834 Body mass index (BMI) 34.0-34.9, adult: Secondary | ICD-10-CM | POA: Diagnosis not present

## 2017-07-23 ENCOUNTER — Encounter: Payer: Medicare HMO | Admitting: Physical Therapy

## 2017-07-26 ENCOUNTER — Ambulatory Visit: Payer: Medicare HMO | Attending: Radiation Oncology | Admitting: Physical Therapy

## 2017-07-26 ENCOUNTER — Encounter: Payer: Self-pay | Admitting: Physical Therapy

## 2017-07-26 DIAGNOSIS — R293 Abnormal posture: Secondary | ICD-10-CM

## 2017-07-26 DIAGNOSIS — M542 Cervicalgia: Secondary | ICD-10-CM | POA: Diagnosis not present

## 2017-07-26 DIAGNOSIS — M25611 Stiffness of right shoulder, not elsewhere classified: Secondary | ICD-10-CM | POA: Insufficient documentation

## 2017-07-26 NOTE — Therapy (Signed)
[place], [place], [address] Phone: [phone]   Fax:  [phone]  Physical Therapy Treatment  Patient Details  Name: Monica [name] MRN: [id number] Date of Birth: [birth year] Referring Provider: Dr. Lisbeth Renshaw   Encounter Date: 07/26/2017  PT End of Session - 07/26/17 1207    Visit Number  4    Number of Visits  9    Date for PT Re-Evaluation  07/30/17    PT Start Time  0934    PT Stop Time  1015    PT Time Calculation (min)  41 min    Activity Tolerance  Patient tolerated treatment well    Behavior During Therapy  [facility] for tasks assessed/performed       Past Medical History:  Diagnosis Date  . Allergy codeine   rapid heart beat, cold sweat  . Arthritis   . Breast cancer ([place]) 01/10/14   Left breast INVASIVE DUCTAL CARCINOMA STAGE 2  . Hypertension   . Hyperthyroidism    medication called in today for hyperthroid  . Personal history of chemotherapy 2015   Left Breast Cancer  . Personal history of radiation therapy 2015   Left Breast Cancer  . PONV (postoperative nausea and vomiting)   . S/P radiation therapy 09/21/14 completed   left breast    Past Surgical History:  Procedure Laterality Date  . ABDOMINAL HYSTERECTOMY  1992  . BREAST LUMPECTOMY Left 02/12/2014  . BREAST LUMPECTOMY WITH NEEDLE LOCALIZATION AND AXILLARY SENTINEL LYMPH NODE BX Left 02/12/2014   Procedure: LEFT BREAST LUMPECTOMY WITH NEEDLE LOCALIZATION AND LEFT AXILLARY SENTINEL LYMPH NODE BX;  Surgeon: [name], MD;  Location: [place];  Service: General;  Laterality: Left;  . BREAST LUMPECTOMY WITH RADIOACTIVE SEED LOCALIZATION Right 04/02/2017   Procedure: BREAST LUMPECTOMY WITH RADIOACTIVE SEED LOCALIZATION;  Surgeon: Excell Seltzer, MD;  Location: [place];  Service: General;  Laterality: Right;  . COLONOSCOPY    . FOOT ARTHROPLASTY  1998   right  . KNEE ARTHROSCOPY  2012    right  . LUMBAR LAMINECTOMY  1991  . PORTACATH PLACEMENT Right 02/12/2014   Procedure: INSERTION PORT-A-CATH;  Surgeon: [name], MD;  Location: [place];  Service: General;  Laterality: Right;    There were no vitals filed for this visit.  Subjective Assessment - 07/26/17 0939    Subjective  I got an epidural in my back and that is why I had to cancel last time. The next big thing is getting my knee fixed. My neck pain is much better.     Pertinent History  From Shona Simpson' note:  "Monica [name] is a pleasant 72 y.o. female who was diagnosed with essentially triple negative left breast cancer in 2015. She underwent lumpectomy 02/12/14 with sentinel lymph node evaluation followed by adjuvant chemotherapy and adjuvant radiotherapy to the breast under the care of Dr. Lisbeth Renshaw." On 04/02/17 she had right lumpectomy for ER/PR positive DCIS; she expects to have 4 weeks of XRT for this. Arthroscopy right knee 11/03/10 for debridement.  Lumbar laminectomy 07/30/89.  Hysterectomy 1992.  Nerve tumor Rt. foot 1998.  Expects to have TKA in 2019.  HTN controlled. Hypothyroid and on meds, which have recently been adjusted due to fatigue.  Pt. says she has been getting epidurals for the last two years, prn, every 3-6 months. Pt. says she had some fluid on right breast and goes back next week to surgeon to  see if she needs aspiration. pt has now completed radiation for her right breast    Patient Stated Goals  to get this knot out of my neck and to get some relief and to be able to move my neck without pain    Currently in Pain?  No/denies    Pain Score  0-No pain                      [facility] Adult PT Treatment/Exercise - 07/26/17 0001      Shoulder Exercises: Supine   Horizontal ABduction  Strengthening;Both;10 reps;Theraband    Theraband Level (Shoulder Horizontal ABduction)  Level 2 (Red)    External Rotation  Strengthening;Both;10 reps;Theraband    Theraband Level  (Shoulder External Rotation)  Level 2 (Red)    Flexion  Strengthening;Both;10 reps;Theraband narrow and wide grip    Theraband Level (Shoulder Flexion)  Level 2 (Red)    Other Supine Exercises  D2 bilaterally x 10 reps with red band      Shoulder Exercises: Standing   Other Standing Exercises  Began instructing in Strength ABC program through back stretch - held all stretches for 15 sec                  PT Long Term Goals - 07/26/17 0947      PT LONG TERM GOAL #1   Title  Pt will report a 75% reduction in cervical pain to allow pt to look over shoulder with less discomfort    Baseline  07/14/17- 50% improvement, 07/26/17- 75% improvement    Time  4    Period  Weeks    Status  Achieved      PT LONG TERM GOAL #2   Title  Pt will be independent in a home exercise program for continued strengthening and stretching    Time  4    Period  Weeks    Status  On-going      PT LONG TERM GOAL #3   Title  Pt will demonstrate full cervical ROM to allow pt to look up and over shoulder without limitation and pain    Baseline  25% loss, 07/14/17- 15% loss    Time  4    Status  On-going      PT LONG TERM GOAL #4   Title  Pt will decrease score on QuickDash to less than 20 to allow improved RUE function    Baseline  45.5    Time  4    Period  Days    Status  On-going        Long Term Clinic Goals - 06/16/17 1252      CC Long Term Goal  #1   Title  Patient will be independent in HEP for low back flexibility and strengthening, and core strengthening.    Status  Achieved      CC Long Term Goal  #2   Title  Patient will report at least 50% decrease in low back pain and pain that radiates from low back.    Baseline  20-30% bette as of 05/28/17; almost 50% as of 06/11/17, pt reports more than 50% imiproved     Status  Achieved      CC Long Term Goal  #3   Title  Quick DASH score will improve to 25 or less.    Baseline  38.64 at eval, 36.36 on 06/16/2017    Status  Partially Met  Plan - 07/26/17 1205    Clinical Impression Statement  Pt has met her cervical pain goal for therapy. Today assessed pt's independence with supine scapular exercises since pt has not been able to try these at home since having her lumbar epidural. Began instructing pt in Strength ABC program to give to pt as a long term HEP to continue after discharge.     Rehab Potential  Good    Clinical Impairments Affecting Rehab Potential  needs bilateral knee replacements    PT Frequency  2x / week    PT Duration  4 weeks    PT Treatment/Interventions  ADLs/Self Care Home Management;Cryotherapy;Electrical Stimulation;Moist Heat;Therapeutic exercise;Patient/family education;Manual techniques;Passive range of motion;Therapeutic activities;Taping    PT Next Visit Plan  conitnue instructing in Strength ABC starting after back stretch and d/c if pt is independent with rest of program    PT Home Exercise Plan  supine scap and dowel exercises    Consulted and Agree with Plan of Care  Patient       Patient will benefit from skilled therapeutic intervention in order to improve the following deficits and impairments:  Pain, Decreased range of motion, Decreased mobility, Decreased strength  Visit Diagnosis: Cervicalgia  Stiffness of right shoulder, not elsewhere classified  Abnormal posture     Problem List Patient Active Problem List   Diagnosis Date Noted  . Malignant neoplasm of upper-inner quadrant of right breast in female, estrogen receptor positive ([place]) 02/24/2017  . Hot flashes 04/05/2015  . Other pancytopenia ([place]) 06/28/2014  . Corneal abrasion 06/27/2014  . Anemia associated with chemotherapy 06/25/2014  . Thrombocytopenia ([place]) 06/25/2014  . Chemotherapy-induced neuropathy ([place]) 06/04/2014  . Decreased appetite 05/28/2014  . Body aches 05/28/2014  . Dyspepsia 05/28/2014  . Insomnia 05/02/2014  . UTI (urinary tract infection) 04/17/2014  . Febrile neutropenia ([place]) 04/15/2014   . Hypokalemia 04/15/2014  . Hypertension 04/15/2014  . Itchy eyes 04/09/2014  . Heart palpitations 04/04/2014  . Nausea without vomiting 03/26/2014  . Heartburn 03/26/2014  . Thrush 03/26/2014  . Breast cancer of lower-outer quadrant of left female breast ([place]) 01/12/2014    Allyson Sabal [facility] 07/26/2017, 12:13 PM  [place], [place], [address] Phone: [phone]   Fax:  [phone]  Name: REGANNE MESSERSCHMIDT MRN: [id number] Date of Birth: [birth year]  Manus Gunning, PT 07/26/17 12:13 PM

## 2017-07-28 ENCOUNTER — Ambulatory Visit: Payer: Medicare HMO

## 2017-07-28 DIAGNOSIS — R293 Abnormal posture: Secondary | ICD-10-CM

## 2017-07-28 DIAGNOSIS — M25611 Stiffness of right shoulder, not elsewhere classified: Secondary | ICD-10-CM

## 2017-07-28 DIAGNOSIS — M542 Cervicalgia: Secondary | ICD-10-CM

## 2017-07-28 NOTE — Therapy (Signed)
[place], [place], [address] Phone: [phone]   Fax:  [phone]  Physical Therapy Treatment  Patient Details  Name: Monica [name] MRN: [id number] Date of Birth: 02/21/1946 Referring Provider: Dr. Lisbeth Renshaw   Encounter Date: 07/28/2017  PT End of Session - 07/28/17 1212    Visit Number  5    Number of Visits  9    Date for PT Re-Evaluation  07/30/17    PT Start Time  0938    PT Stop Time  1022    PT Time Calculation (min)  44 min    Activity Tolerance  Patient tolerated treatment well    Behavior During Therapy  [name] for tasks assessed/performed       Past Medical History:  Diagnosis Date  . Allergy codeine   rapid heart beat, cold sweat  . Arthritis   . Breast cancer ([place]) 01/10/14   Left breast INVASIVE DUCTAL CARCINOMA STAGE 2  . Hypertension   . Hyperthyroidism    medication called in today for hyperthroid  . Personal history of chemotherapy 2015   Left Breast Cancer  . Personal history of radiation therapy 2015   Left Breast Cancer  . PONV (postoperative nausea and vomiting)   . S/P radiation therapy 09/21/14 completed   left breast    Past Surgical History:  Procedure Laterality Date  . ABDOMINAL HYSTERECTOMY  1992  . BREAST LUMPECTOMY Left 02/12/2014  . BREAST LUMPECTOMY WITH NEEDLE LOCALIZATION AND AXILLARY SENTINEL LYMPH NODE BX Left 02/12/2014   Procedure: LEFT BREAST LUMPECTOMY WITH NEEDLE LOCALIZATION AND LEFT AXILLARY SENTINEL LYMPH NODE BX;  Surgeon: [name], MD;  Location: [place];  Service: General;  Laterality: Left;  . BREAST LUMPECTOMY WITH RADIOACTIVE SEED LOCALIZATION Right 04/02/2017   Procedure: BREAST LUMPECTOMY WITH RADIOACTIVE SEED LOCALIZATION;  Surgeon: Excell Seltzer, MD;  Location: [facility];  Service: General;  Laterality: Right;  . COLONOSCOPY    . FOOT ARTHROPLASTY  1998   right  . KNEE ARTHROSCOPY  2012    right  . LUMBAR LAMINECTOMY  1991  . PORTACATH PLACEMENT Right 02/12/2014   Procedure: INSERTION PORT-A-CATH;  Surgeon: [name], MD;  Location: [facility];  Service: General;  Laterality: Right;    There were no vitals filed for this visit.  Subjective Assessment - 07/28/17 0941    Subjective  The epidural has been helping my LBP. I took a pain pill so no pain right now.     Pertinent History  From Shona Simpson' note:  "Monica [name] is a pleasant 72 y.o. female who was diagnosed with essentially triple negative left breast cancer in 2015. She underwent lumpectomy 02/12/14 with sentinel lymph node evaluation followed by adjuvant chemotherapy and adjuvant radiotherapy to the breast under the care of Dr. Lisbeth Renshaw." On 04/02/17 she had right lumpectomy for ER/PR positive DCIS; she expects to have 4 weeks of XRT for this. Arthroscopy right knee 11/03/10 for debridement.  Lumbar laminectomy 07/30/89.  Hysterectomy 1992.  Nerve tumor Rt. foot 1998.  Expects to have TKA in 2019.  HTN controlled. Hypothyroid and on meds, which have recently been adjusted due to fatigue.  Pt. says she has been getting epidurals for the last two years, prn, every 3-6 months. Pt. says she had some fluid on right breast and goes back next week to surgeon to see if she needs aspiration. pt has now completed radiation for her right breast  Patient Stated Goals  to get this knot out of my neck and to get some relief and to be able to move my neck without pain    Currently in Pain?  No/denies         [facility] PT Assessment - 07/28/17 0001      AROM   Cervical Extension  full no pain    Cervical - Right Rotation  full but with tightness           Quick Dash - 07/28/17 0001    Open a tight or new jar  Mild difficulty    Do heavy household chores (wash walls, wash floors)  No difficulty    Carry a shopping bag or briefcase  No difficulty    Wash your back  No difficulty    Use a knife to cut food   No difficulty    Recreational activities in which you take some force or impact through your arm, shoulder, or hand (golf, hammering, tennis)  No difficulty    During the past week, to what extent has your arm, shoulder or hand problem interfered with your normal social activities with family, friends, neighbors, or groups?  Not at all    During the past week, to what extent has your arm, shoulder or hand problem limited your work or other regular daily activities  Not at all    Arm, shoulder, or hand pain.  Mild    Tingling (pins and needles) in your arm, shoulder, or hand  None    Difficulty Sleeping  No difficulty    DASH Score  4.55 %            [facility] Adult PT Treatment/Exercise - 07/28/17 0001      Shoulder Exercises: Standing   Other Standing Exercises  Completed Strength ABC Program peforming 10 times each.             PT Education - 07/28/17 1211    Education provided  Yes    Education Details  Strength ABC Program     Person(s) Educated  Patient    Methods  Explanation;Demonstration;Handout    Comprehension  Verbalized understanding;Returned demonstration          PT Long Term Goals - 07/28/17 1015      PT LONG TERM GOAL #1   Title  Pt will report a 75% reduction in cervical pain to allow pt to look over shoulder with less discomfort    Baseline  07/14/17- 50% improvement, 07/26/17- 75% improvement; 75-80% imporvement with this-07/28/17    Status  Achieved      PT LONG TERM GOAL #2   Title  Pt will be independent in a home exercise program for continued strengthening and stretching    Status  Achieved      PT LONG TERM GOAL #3   Title  Pt will demonstrate full cervical ROM to allow pt to look up and over shoulder without limitation and pain    Baseline  25% loss, 07/14/17- 15% loss; pt still with tightness at end ROM but can look over shoulder and up-07/28/17    Status  Achieved      PT LONG TERM GOAL #4   Title  Pt will decrease score on QuickDash to less  than 20 to allow improved RUE function    Baseline  45.5; 4.5 - 07/28/17    Status  Achieved        Long Term Clinic Goals - 06/16/17 1252  CC Long Term Goal  #1   Title  Patient will be independent in HEP for low back flexibility and strengthening, and core strengthening.    Status  Achieved      CC Long Term Goal  #2   Title  Patient will report at least 50% decrease in low back pain and pain that radiates from low back.    Baseline  20-30% bette as of 05/28/17; almost 50% as of 06/11/17, pt reports more than 50% imiproved     Status  Achieved      CC Long Term Goal  #3   Title  Quick DASH score will improve to 25 or less.    Baseline  38.64 at eval, 36.36 on 06/16/2017    Status  Partially Met         Plan - 07/28/17 1214    Clinical Impression Statement  Pt has now met all goals and is ready for D/C.     Rehab Potential  Good    Clinical Impairments Affecting Rehab Potential  needs bilateral knee replacements    PT Frequency  2x / week    PT Duration  4 weeks    PT Treatment/Interventions  ADLs/Self Care Home Management;Cryotherapy;Electrical Stimulation;Moist Heat;Therapeutic exercise;Patient/family education;Manual techniques;Passive range of motion;Therapeutic activities;Taping    PT Next Visit Plan  D/C this visit.    Consulted and Agree with Plan of Care  Patient       Patient will benefit from skilled therapeutic intervention in order to improve the following deficits and impairments:  Pain, Decreased range of motion, Decreased mobility, Decreased strength  Visit Diagnosis: Cervicalgia  Stiffness of right shoulder, not elsewhere classified  Abnormal posture     Problem List Patient Active Problem List   Diagnosis Date Noted  . Malignant neoplasm of upper-inner quadrant of right breast in female, estrogen receptor positive ([place]) 02/24/2017  . Hot flashes 04/05/2015  . Other pancytopenia ([place]) 06/28/2014  . Corneal abrasion 06/27/2014  . Anemia  associated with chemotherapy 06/25/2014  . Thrombocytopenia ([place]) 06/25/2014  . Chemotherapy-induced neuropathy ([place]) 06/04/2014  . Decreased appetite 05/28/2014  . Body aches 05/28/2014  . Dyspepsia 05/28/2014  . Insomnia 05/02/2014  . UTI (urinary tract infection) 04/17/2014  . Febrile neutropenia ([place]) 04/15/2014  . Hypokalemia 04/15/2014  . Hypertension 04/15/2014  . Itchy eyes 04/09/2014  . Heart palpitations 04/04/2014  . Nausea without vomiting 03/26/2014  . Heartburn 03/26/2014  . Thrush 03/26/2014  . Breast cancer of lower-outer quadrant of left female breast ([place]) 01/12/2014    Otelia Limes, PTA 07/28/2017, 12:17 PM  [place], [place], [address] Phone: [phone]   Fax:  [phone]  Name: SHANIKIA KERNODLE MRN: [id number] Date of Birth: [birth year]  PHYSICAL THERAPY DISCHARGE SUMMARY  Visits from Start of Care: 5  Current functional level related to goals / functional outcomes: See above   Remaining deficits: See above   Education / Equipment: See above  Plan: Patient agrees to discharge.  Patient goals were met. Patient is being discharged due to meeting the stated rehab goals.  ?????    [facility] [place], [place] 07/28/17 1:22 PM

## 2017-08-06 DIAGNOSIS — M1711 Unilateral primary osteoarthritis, right knee: Secondary | ICD-10-CM | POA: Diagnosis not present

## 2017-08-06 DIAGNOSIS — M1712 Unilateral primary osteoarthritis, left knee: Secondary | ICD-10-CM | POA: Diagnosis not present

## 2017-08-12 ENCOUNTER — Ambulatory Visit: Payer: Self-pay | Admitting: Radiation Oncology

## 2017-08-12 DIAGNOSIS — H33103 Unspecified retinoschisis, bilateral: Secondary | ICD-10-CM | POA: Diagnosis not present

## 2017-08-12 DIAGNOSIS — H35372 Puckering of macula, left eye: Secondary | ICD-10-CM | POA: Diagnosis not present

## 2017-08-12 DIAGNOSIS — H04123 Dry eye syndrome of bilateral lacrimal glands: Secondary | ICD-10-CM | POA: Diagnosis not present

## 2017-08-12 DIAGNOSIS — H25013 Cortical age-related cataract, bilateral: Secondary | ICD-10-CM | POA: Diagnosis not present

## 2017-08-12 DIAGNOSIS — H2513 Age-related nuclear cataract, bilateral: Secondary | ICD-10-CM | POA: Diagnosis not present

## 2017-08-12 NOTE — Progress Notes (Signed)
Needs orders in epic for 09-20-17 surgery

## 2017-08-13 DIAGNOSIS — M170 Bilateral primary osteoarthritis of knee: Secondary | ICD-10-CM | POA: Diagnosis not present

## 2017-08-13 DIAGNOSIS — I10 Essential (primary) hypertension: Secondary | ICD-10-CM | POA: Diagnosis not present

## 2017-08-13 DIAGNOSIS — E039 Hypothyroidism, unspecified: Secondary | ICD-10-CM | POA: Diagnosis not present

## 2017-08-13 DIAGNOSIS — C50919 Malignant neoplasm of unspecified site of unspecified female breast: Secondary | ICD-10-CM | POA: Diagnosis not present

## 2017-08-13 DIAGNOSIS — J45991 Cough variant asthma: Secondary | ICD-10-CM | POA: Diagnosis not present

## 2017-08-15 ENCOUNTER — Ambulatory Visit: Payer: Self-pay | Admitting: Orthopedic Surgery

## 2017-08-16 ENCOUNTER — Ambulatory Visit
Admission: RE | Admit: 2017-08-16 | Discharge: 2017-08-16 | Disposition: A | Discharge status: Home / Self Care | Payer: Medicare HMO | Source: Ambulatory Visit | Attending: Radiation Oncology | Admitting: Radiation Oncology

## 2017-08-16 ENCOUNTER — Encounter: Payer: Self-pay | Admitting: Radiation Oncology

## 2017-08-16 ENCOUNTER — Other Ambulatory Visit: Payer: Self-pay

## 2017-08-16 VITALS — BP 147/81 | HR 72 | Temp 97.7°F | Resp 20 | Ht 66.5 in | Wt 223.8 lb

## 2017-08-16 DIAGNOSIS — Z923 Personal history of irradiation: Secondary | ICD-10-CM | POA: Diagnosis not present

## 2017-08-16 DIAGNOSIS — D0511 Intraductal carcinoma in situ of right breast: Secondary | ICD-10-CM | POA: Diagnosis not present

## 2017-08-16 DIAGNOSIS — Z170 Estrogen receptor positive status [ER+]: Secondary | ICD-10-CM

## 2017-08-16 DIAGNOSIS — C50211 Malignant neoplasm of upper-inner quadrant of right female breast: Secondary | ICD-10-CM

## 2017-08-16 DIAGNOSIS — Z79899 Other long term (current) drug therapy: Secondary | ICD-10-CM | POA: Insufficient documentation

## 2017-08-16 DIAGNOSIS — Z79811 Long term (current) use of aromatase inhibitors: Secondary | ICD-10-CM | POA: Insufficient documentation

## 2017-08-16 NOTE — Progress Notes (Signed)
Radiation Oncology         [phone] ________________________________  Name: Monica [name] MRN: [id number]  Date of Service: 08/16/2017  DOB: 10/27/1945  Post Treatment Note  CC: Maury Dus, MD  Excell Seltzer, MD  Diagnosis:   ER/PR positive DCIS of the right breast  Interval Since Last Radiation:  7 weeks   06/02/17-07/01/17: Right breast/ 50 Gy in 20 fractions including 7.5 Gy boost to the lumpectomy cavity  08/27/2014-09/21/2014: 42.5 Gy in 17 fractions to the left breast with whole breast tangent fields 7.5  Gy in 3 fraction boost to the seroma   Narrative:  The patient returns today for routine follow-up. During treatment she did very well with radiotherapy and did not have significant desquamation.                             On review of systems, the patient states she is doing well. She denies any concerns with her skin at this time. No other complaints are noted.   ALLERGIES:  is allergic to codeine and adhesive [tape].  Meds: Current Outpatient Medications  Medication Sig Dispense Refill  . amLODipine (NORVASC) 5 MG tablet Take 5 mg by mouth daily.    [name] anastrozole (ARIMIDEX) 1 MG tablet Take 1 tablet (1 mg total) by mouth daily. 90 tablet 4  . Cholecalciferol (VITAMIN D3) 2000 UNITS TABS Take 1 tablet by mouth daily.    . cloNIDine (CATAPRES-TTS-1) 0.1 mg/24hr patch Place 1 patch (0.1 mg total) onto the skin once a week. 12 patch 3  . diclofenac sodium (VOLTAREN) 1 % GEL Apply 2 g topically 4 (four) times daily.    [name] docusate sodium (COLACE) 100 MG capsule Take 100 mg by mouth daily as needed for mild constipation.     . gabapentin (NEURONTIN) 300 MG capsule Take 1 capsule (300 mg total) by mouth at bedtime. 90 capsule 0  . Ginkgo Biloba 120 MG CAPS Take 1 capsule by mouth daily.    [name] levothyroxine (SYNTHROID, LEVOTHROID) 75 MCG tablet Take 75 mcg by mouth daily before breakfast.    . meloxicam (MOBIC) 15 MG tablet Take 15 mg by mouth daily.    .  Omega-3 Fatty Acids (FISH OIL CONCENTRATE) 300 MG CAPS Take by mouth.    . oxyCODONE-acetaminophen (PERCOCET/ROXICET) 5-325 MG tablet Take 1 tablet by mouth every 4 (four) hours as needed for severe pain.    . potassium chloride SA (K-DUR,KLOR-CON) 20 MEQ tablet Take 20 mEq by mouth once.    . valsartan-hydrochlorothiazide (DIOVAN-HCT) 320-25 MG per tablet Take 1 tablet by mouth daily.     No current facility-administered medications for this encounter.     Physical Findings:  height is 5' 6.5" (1.689 m) and weight is 223 lb 12.8 oz (101.5 kg). Her oral temperature is 97.7 F (36.5 C). Her blood pressure is 147/81 (abnormal) and her pulse is 72. Her respiration is 20 and oxygen saturation is 99%.  Pain Assessment Pain Score: 0-No pain/10 In general this is a well appearing African American female in no acute distress. She's alert and oriented x4 and appropriate throughout the examination. Cardiopulmonary assessment is negative for acute distress and she exhibits normal effort. The right breast was examined and reveals mild hyperpigmentation, and no desquamation is noted.   Lab Findings: Lab Results  Component Value Date   WBC 3.3 (L) 07/06/2017   HGB 12.9 07/06/2017   HCT 39.3 07/06/2017  MCV 89.3 07/06/2017   PLT 257 07/06/2017     Radiographic Findings: No results found.  Impression/Plan: 1. ER/PR positive DCIS of the right breast, and history of Stage IIA, pT2N0 grade 3 tripe negative invasive ductal carcinoma of the left breast. The patient has been doing well since completion of radiotherapy. We discussed that we would be happy to continue to follow her as needed, but she will also continue to follow up with Dr. Jana Hakim in medical oncology. She was counseled on skin care as well as measures to avoid sun exposure to this area.  2. Survivorship. She will be scheduled for evaluation in the survivorship clinic by medical oncology at the appropriate interval.      Carola Rhine, PAC

## 2017-08-26 ENCOUNTER — Ambulatory Visit: Payer: Self-pay | Admitting: Orthopedic Surgery

## 2017-08-26 NOTE — H&P (Signed)
Name Monica [name], SHISLER [phone], F) DOB [birth year]   Chief Complaint Left Knee Pain H&P left TKA 09-20-2017  Patient's Pharmacies [facility] [address] [facility]): [place] ([place]) [place] [address], Ph [phone], Fax [phone]  Vitals Ht: 5 ft 6 in  Wt: 222 lbs  BMI: 35.8 BP: 138/72 sitting R arm Pulse: 68 bpm   Allergies Reviewed Allergies CODEINE: - Breathing Issues, Sweating   Medications Reviewed Medications amLODIPine besylate (bulk) 07/08/17   entered Faith Brown anastrozole 07/08/17   entered Loura Back cloNIDine 0.1 mg/24 hr weekly transdermal patch Apply 1 patch(es) every week by transdermal route. 07/08/17   entered Loura Back diclofenac 1 % topical gel APPLY 2 GRAM TO THE AFFECTED AREA(S) BY TOPICAL ROUTE 4 TIMES PER DAY 07/08/17   entered Faith Brown Diovan HCT 320 mg-25 mg tablet Take 1 tablet(s) every day by oral route. 07/08/17   entered Loura Back gabapentin 300 mg capsule Take 1 capsule(s) 3 times a day by oral route. 07/08/17   entered Loura Back ginkgo biloba 07/08/17   entered Loura Back levothyroxine 07/08/17   entered Faith Brown meloxicam 07/08/17   entered Faith Brown omega 3-dha 300 mg-epa 400 mg-fish oil 1,000 mg capsule Take by oral route. 07/08/17   entered Loura Back oxyCODONE-acetaminophen 5 mg-325 mg tablet Take 1 tablet(s) every 6 hours by oral route. 07/08/17   entered Faith Brown potassium chloride 07/08/17   entered Loura Back Stool Softener 07/08/17   entered Loura Back Vitamin D3 07/08/17   entered Loura Back   Problems Osteoarthritis of right knee joint Osteoarthritis of left knee joint   Family History Reviewed Family History Mother - Hypertensive disorder   - Diabetes mellitus   - Mother deceased - 08/11/91 Sister - History of carcinoma Brother - History of carcinoma Father - Father deceased - 30-Dec-1999  Social History Reviewed Social History Smoking Status: Never  smoker Non-smoker Chewing tobacco: none Alcohol intake: None Hand Dominance: Right Work related injury?: N Advance directive: Y (Notes: Living Will) Medical Power of Attorney: N  Surgical History Reviewed Surgical History Biopsy of nerve - Right Foot - 1998 Hysterectomy - 1992 Lumpectomy of breast - Right 04/02/2017, Left 02/12/2014 Knee Arthroscopy - 11/03/2010 - Right Knee Lumbar Laminectomy - 1989-08-10  GYN History Most Recent Mammogram: 02/24/2017.  Past Medical History Reviewed Past Medical History Cancer: Y - breast cancer Hypertension: Y Previous Cortisone Injection(s): Y Notes: Post Chemotherapy Neuralgia,  Hypothyroidism,  Degenerative Arthritis Lumbar Spine   HPI The patient is here today for a pre-operative History and Physical. They are scheduled for left total knee replacement on 09-20-2017 with Dr. Wynelle Link at [facility]. The patient has been seen and followed for bilateral knee osteoarthritis. She has been diagnosed with osteoarthritis of the knees and has been treated conservatively using cortisone injections. Previous Injections have helped temporarily. Alleviating Factors: narcotics; NSAIDs; She takes Oxycodone for treatment of back pain. She has also been using some Voltaren gel on her knees. Aggravating Factors include weightbearing exercise. Symptoms include catching/locking; popping/clicking; grinding She reports that she has had Synvisc in the past. They have "knocked the edge off" but have not helped significantly.  Previous PT has helped temporarily. She did therapy at [facility] which helped short term. She reports that she just finished radiation for treatment of breast cancer. She states that she is to the point she would like to go ahead and proceed with surgery. Her knees have gotten progressively worse. The injections provided  minimal benefit. She is at a point where it is hurting at all times and limiting what she can and cannot do. She had a  stage as he is ready to go ahead and get her LEFT knee replaced as that one is hurting more than the RIGHT.   ROS Constitutional: Constitutional: no fever, chills, night sweats, or significant weight loss; Positive for hot flashes which she attributes to her medication.  Cardiovascular: Cardiovascular: no palpitations or chest pain.  Respiratory: Respiratory: no cough or shortness of breath and No COPD.  Gastrointestinal: Gastrointestinal: no vomiting or nausea.  Musculoskeletal: Musculoskeletal: Joint Pain.  Neurologic: Neurologic: no numbness, tingling, or difficulty with balance.   Physical Exam Patient is a 72 year old female.  General Mental Status - Alert, cooperative and good historian. General Appearance - pleasant, Not in acute distress. Orientation - Oriented X3. Build & Nutrition - Well nourished and Well developed.  Head and Neck Head - normocephalic, atraumatic . Neck Global Assessment - supple, no bruit auscultated on the right, no bruit auscultated on the left.  Eye Pupil - Bilateral - PERR Motion - Bilateral - EOMI.  Chest and Lung Exam Auscultation Breath sounds - clear at anterior chest wall and clear at posterior chest wall. Adventitious sounds - No Adventitious sounds.  Cardiovascular Auscultation Rhythm - Regular rate and rhythm. Heart Sounds - S1 WNL and S2 WNL. Murmurs & Other Heart Sounds - Auscultation of the heart reveals - No Murmurs.  Abdomen Palpation/Percussion Tenderness - Abdomen is non-tender to palpation. Abdomen is soft. Auscultation Auscultation of the abdomen reveals - Bowel sounds normal.  Genitourinary Note: Not done, not pertinent to present illness  Musculoskeletal LEFT knee shows no effusion. Range of motion about 10-110. There is moderate crepitus on range of motion with tenderness medial greater than lateral and no instability. RIGHT knee range about 5-115 with marked crepitus on range of motion and tenderness medial  greater than lateral with no instability noted. Pulses, sensation and motor are intact both lower extremities. She has significant antalgic gait pattern.  Her radiographs are reviewed she has severe bone-on-bone arthritis of all 3 compartments of both knees.  Assessment / Plan 1. Osteoarthritis of left knee joint M17.12: Unilateral primary osteoarthritis, left knee  Patient Instructions Surgical Plans: Left Total Knee Replacement Disposition: Home with Husband, HHPT to start following discharge from hospital. PCP: Dr. Maury Dus - cleared to proceed with surgery Topical TXA - History of Bilateral Breast Cancer AVOID LEFT ARM FOR BP'S AND IV'S Anesthesia Issues: None Patient was instructed on what medications to stop prior to surgery. - Follow up visit in 2 weeks with Dr. Wynelle Link - Begin physical therapy following surgery - Pre-operative lab work as pre Pre-Surgical Testing - Prescriptions will be provided in hospital at time of discharge  Return to [place], MD for [place] at [facility] on 10/05/2017 at 02:30 PM  Encounter signed-off by Mickel Crow, PA-C

## 2017-08-31 DIAGNOSIS — J45991 Cough variant asthma: Secondary | ICD-10-CM | POA: Diagnosis not present

## 2017-08-31 DIAGNOSIS — Z853 Personal history of malignant neoplasm of breast: Secondary | ICD-10-CM | POA: Diagnosis not present

## 2017-08-31 DIAGNOSIS — M170 Bilateral primary osteoarthritis of knee: Secondary | ICD-10-CM | POA: Diagnosis not present

## 2017-08-31 DIAGNOSIS — I10 Essential (primary) hypertension: Secondary | ICD-10-CM | POA: Diagnosis not present

## 2017-08-31 DIAGNOSIS — E039 Hypothyroidism, unspecified: Secondary | ICD-10-CM | POA: Diagnosis not present

## 2017-09-08 ENCOUNTER — Telehealth: Payer: Self-pay | Admitting: Oncology

## 2017-09-08 NOTE — Telephone Encounter (Signed)
3:05 pm left vm returning patient's call regarding Insurance paperwork.  Let her know I haven't rec'd any paperwork in regards to any Insurance or FMLA/Disability in the HIM dept.

## 2017-09-13 NOTE — Patient Instructions (Addendum)
Monica [name]  09/13/2017   Your procedure is scheduled on: 09-20-17   Report to [facility] Main  Entrance     ARRIVE AT 16 AM. Have a seat in the Main Lobby. Please note there is a phone at the [employer]. Please call [phone] on that phone. Someone from Short Stay will come and get you from the Main Lobby and take you to Short Stay.   Call this number if you have problems the morning of surgery [phone]   Remember: Do not eat food or drink liquids :After Midnight.     Take these medicines the morning of surgery with A SIP OF WATER: Amlodipine (Norvasc), Anastrozole (Arimidex), and Levothyroxine (Synthroid). You may also bring and use your eyedrops as needed.                                You may not have any metal on your body including hair pins and              piercings  Do not wear jewelry, make-up, lotions, powders or perfumes, deodorant             Do not wear nail polish.  Do not shave  48 hours prior to surgery.                 Do not bring valuables to the hospital. [place].  Contacts, dentures or bridgework may not be worn into surgery.  Leave suitcase in the car. After surgery it may be brought to your room.                  Please read over the following fact sheets you were given: _____________________________________________________________________           [facility] - Preparing for Surgery Before surgery, you can play an important role.  Because skin is not sterile, your skin needs to be as free of germs as possible.  You can reduce the number of germs on your skin by washing with CHG (chlorahexidine gluconate) soap before surgery.  CHG is an antiseptic cleaner which kills germs and bonds with the skin to continue killing germs even after washing. Please DO NOT use if you have an allergy to CHG or antibacterial soaps.  If your skin becomes reddened/irritated  stop using the CHG and inform your nurse when you arrive at Short Stay. Do not shave (including legs and underarms) for at least 48 hours prior to the first CHG shower.  You may shave your face/neck. Please follow these instructions carefully:  1.  Shower with CHG Soap the night before surgery and the  morning of Surgery.  2.  If you choose to wash your hair, wash your hair first as usual with your  normal  shampoo.  3.  After you shampoo, rinse your hair and body thoroughly to remove the  shampoo.                           4.  Use CHG as you would any other liquid soap.  You can apply chg directly  to the skin and wash  Gently with a scrungie or clean washcloth.  5.  Apply the CHG Soap to your body ONLY FROM THE NECK DOWN.   Do not use on face/ open                           Wound or open sores. Avoid contact with eyes, ears mouth and genitals (private parts).                       Wash face,  Genitals (private parts) with your normal soap.             6.  Wash thoroughly, paying special attention to the area where your surgery  will be performed.  7.  Thoroughly rinse your body with warm water from the neck down.  8.  DO NOT shower/wash with your normal soap after using and rinsing off  the CHG Soap.                9.  Pat yourself dry with a clean towel.            10.  Wear clean pajamas.            11.  Place clean sheets on your bed the night of your first shower and do not  sleep with pets. Day of Surgery : Do not apply any lotions/deodorants the morning of surgery.  Please wear clean clothes to the hospital/surgery center.  FAILURE TO FOLLOW THESE INSTRUCTIONS MAY RESULT IN THE CANCELLATION OF YOUR SURGERY PATIENT SIGNATURE_________________________________  NURSE SIGNATURE__________________________________  ________________________________________________________________________   Adam Phenix  An incentive spirometer is a tool that can help keep your  lungs clear and active. This tool measures how well you are filling your lungs with each breath. Taking long deep breaths may help reverse or decrease the chance of developing breathing (pulmonary) problems (especially infection) following:  A long period of time when you are unable to move or be active. BEFORE THE PROCEDURE   If the spirometer includes an indicator to show your best effort, your nurse or respiratory therapist will set it to a desired goal.  If possible, sit up straight or lean slightly forward. Try not to slouch.  Hold the incentive spirometer in an upright position. INSTRUCTIONS FOR USE  1. Sit on the edge of your bed if possible, or sit up as far as you can in bed or on a chair. 2. Hold the incentive spirometer in an upright position. 3. Breathe out normally. 4. Place the mouthpiece in your mouth and seal your lips tightly around it. 5. Breathe in slowly and as deeply as possible, raising the piston or the ball toward the top of the column. 6. Hold your breath for 3-5 seconds or for as long as possible. Allow the piston or ball to fall to the bottom of the column. 7. Remove the mouthpiece from your mouth and breathe out normally. 8. Rest for a few seconds and repeat Steps 1 through 7 at least 10 times every 1-2 hours when you are awake. Take your time and take a few normal breaths between deep breaths. 9. The spirometer may include an indicator to show your best effort. Use the indicator as a goal to work toward during each repetition. 10. After each set of 10 deep breaths, practice coughing to be sure your lungs are clear. If you have an incision (the cut made at the time of surgery),  support your incision when coughing by placing a pillow or rolled up towels firmly against it. Once you are able to get out of bed, walk around indoors and cough well. You may stop using the incentive spirometer when instructed by your caregiver.  RISKS AND COMPLICATIONS  Take your time so  you do not get dizzy or light-headed.  If you are in pain, you may need to take or ask for pain medication before doing incentive spirometry. It is harder to take a deep breath if you are having pain. AFTER USE  Rest and breathe slowly and easily.  It can be helpful to keep track of a log of your progress. Your caregiver can provide you with a simple table to help with this. If you are using the spirometer at home, follow these instructions: [place] IF:   You are having difficultly using the spirometer.  You have trouble using the spirometer as often as instructed.  Your pain medication is not giving enough relief while using the spirometer.  You develop fever of 100.5 F (38.1 C) or higher. SEEK IMMEDIATE MEDICAL CARE IF:   You cough up bloody sputum that had not been present before.  You develop fever of 102 F (38.9 C) or greater.  You develop worsening pain at or near the incision site. MAKE SURE YOU:   Understand these instructions.  Will watch your condition.  Will get help right away if you are not doing well or get worse. Document Released: 10/19/2006 Document Revised: 08/31/2011 Document Reviewed: 12/20/2006 ExitCare Patient Information 2014 ExitCare, [place].   ________________________________________________________________________  WHAT IS A BLOOD TRANSFUSION? Blood Transfusion Information  A transfusion is the replacement of blood or some of its parts. Blood is made up of multiple cells which provide different functions.  Red blood cells carry oxygen and are used for blood loss replacement.  White blood cells fight against infection.  Platelets control bleeding.  Plasma helps clot blood.  Other blood products are available for specialized needs, such as hemophilia or other clotting disorders. BEFORE THE TRANSFUSION  Who gives blood for transfusions?   Healthy volunteers who are fully evaluated to make sure their blood is safe. This is blood  bank blood. Transfusion therapy is the safest it has ever been in the practice of medicine. Before blood is taken from a donor, a complete history is taken to make sure that person has no history of diseases nor engages in risky social behavior (examples are intravenous drug use or sexual activity with multiple partners). The donor's travel history is screened to minimize risk of transmitting infections, such as malaria. The donated blood is tested for signs of infectious diseases, such as HIV and hepatitis. The blood is then tested to be sure it is compatible with you in order to minimize the chance of a transfusion reaction. If you or a relative donates blood, this is often done in anticipation of surgery and is not appropriate for emergency situations. It takes many days to process the donated blood. RISKS AND COMPLICATIONS Although transfusion therapy is very safe and saves many lives, the main dangers of transfusion include:   Getting an infectious disease.  Developing a transfusion reaction. This is an allergic reaction to something in the blood you were given. Every precaution is taken to prevent this. The decision to have a blood transfusion has been considered carefully by your caregiver before blood is given. Blood is not given unless the benefits outweigh the risks. AFTER THE TRANSFUSION  Right after receiving a blood transfusion, you will usually feel much better and more energetic. This is especially true if your red blood cells have gotten low (anemic). The transfusion raises the level of the red blood cells which carry oxygen, and this usually causes an energy increase.  The nurse administering the transfusion will monitor you carefully for complications. HOME CARE INSTRUCTIONS  No special instructions are needed after a transfusion. You may find your energy is better. Speak with your caregiver about any limitations on activity for underlying diseases you may have. SEEK MEDICAL CARE  IF:   Your condition is not improving after your transfusion.  You develop redness or irritation at the intravenous (IV) site. SEEK IMMEDIATE MEDICAL CARE IF:  Any of the following symptoms occur over the next 12 hours:  Shaking chills.  You have a temperature by mouth above 102 F (38.9 C), not controlled by medicine.  Chest, back, or muscle pain.  People around you feel you are not acting correctly or are confused.  Shortness of breath or difficulty breathing.  Dizziness and fainting.  You get a rash or develop hives.  You have a decrease in urine output.  Your urine turns a dark color or changes to pink, red, or brown. Any of the following symptoms occur over the next 10 days:  You have a temperature by mouth above 102 F (38.9 C), not controlled by medicine.  Shortness of breath.  Weakness after normal activity.  The white part of the eye turns yellow (jaundice).  You have a decrease in the amount of urine or are urinating less often.  Your urine turns a dark color or changes to pink, red, or brown. Document Released: 06/05/2000 Document Revised: 08/31/2011 Document Reviewed: 01/23/2008 [facility] Patient Information 2014 [place], [place].  _______________________________________________________________________

## 2017-09-13 NOTE — Progress Notes (Signed)
03-08-17 (Epic) EKG  04-07-17 Surgical Clearance from Dr. Alyson Ingles on chart

## 2017-09-14 ENCOUNTER — Encounter (HOSPITAL_COMMUNITY)
Admission: RE | Admit: 2017-09-14 | Discharge: 2017-09-14 | Disposition: A | Discharge status: Home / Self Care | Payer: Medicare HMO | Source: Ambulatory Visit | Attending: Orthopedic Surgery | Admitting: Orthopedic Surgery

## 2017-09-14 ENCOUNTER — Encounter (HOSPITAL_COMMUNITY): Payer: Self-pay

## 2017-09-14 ENCOUNTER — Other Ambulatory Visit: Payer: Self-pay

## 2017-09-14 DIAGNOSIS — M1712 Unilateral primary osteoarthritis, left knee: Secondary | ICD-10-CM | POA: Insufficient documentation

## 2017-09-14 DIAGNOSIS — Z01812 Encounter for preprocedural laboratory examination: Secondary | ICD-10-CM | POA: Diagnosis present

## 2017-09-14 LAB — COMPREHENSIVE METABOLIC PANEL
ALBUMIN: 3.5 g/dL (ref 3.5–5.0)
ALK PHOS: 95 U/L (ref 38–126)
ALT: 18 U/L (ref 14–54)
ANION GAP: 5 (ref 5–15)
AST: 25 U/L (ref 15–41)
BUN: 23 mg/dL — ABNORMAL HIGH (ref 6–20)
CO2: 30 mmol/L (ref 22–32)
Calcium: 9.5 mg/dL (ref 8.9–10.3)
Chloride: 104 mmol/L (ref 101–111)
Creatinine, Ser: 0.94 mg/dL (ref 0.44–1.00)
GFR calc non Af Amer: >60 mL/min (ref >60)
Glucose, Bld: 94 mg/dL (ref 65–99)
POTASSIUM: 4.2 mmol/L (ref 3.5–5.1)
SODIUM: 139 mmol/L (ref 135–145)
TOTAL PROTEIN: 7.1 g/dL (ref 6.5–8.1)
Total Bilirubin: 0.8 mg/dL (ref 0.3–1.2)

## 2017-09-14 LAB — CBC
HCT: 38.9 % (ref 36.0–46.0)
HEMOGLOBIN: 12.4 g/dL (ref 12.0–15.0)
MCH: 29.1 pg (ref 26.0–34.0)
MCHC: 31.9 g/dL (ref 30.0–36.0)
MCV: 91.3 fL (ref 78.0–100.0)
PLATELETS: 261 10*3/uL (ref 150–400)
RBC: 4.26 MIL/uL (ref 3.87–5.11)
RDW: 14.8 % (ref 11.5–15.5)
WBC: 3.1 10*3/uL — ABNORMAL LOW (ref 4.0–10.5)

## 2017-09-14 LAB — SURGICAL PCR SCREEN
MRSA, PCR: NEGATIVE
Staphylococcus aureus: NEGATIVE

## 2017-09-14 LAB — PROTIME-INR
INR: 0.94
PROTHROMBIN TIME: 12.5 s (ref 11.4–15.2)

## 2017-09-14 LAB — APTT: aPTT: 27 seconds (ref 24–36)

## 2017-09-14 LAB — ABO/RH: ABO/RH(D): B POS

## 2017-09-16 DIAGNOSIS — E2839 Other primary ovarian failure: Secondary | ICD-10-CM | POA: Diagnosis not present

## 2017-09-16 DIAGNOSIS — M5126 Other intervertebral disc displacement, lumbar region: Secondary | ICD-10-CM | POA: Diagnosis not present

## 2017-09-16 DIAGNOSIS — I10 Essential (primary) hypertension: Secondary | ICD-10-CM | POA: Diagnosis not present

## 2017-09-16 DIAGNOSIS — Z6837 Body mass index (BMI) 37.0-37.9, adult: Secondary | ICD-10-CM | POA: Diagnosis not present

## 2017-09-16 DIAGNOSIS — D709 Neutropenia, unspecified: Secondary | ICD-10-CM | POA: Diagnosis not present

## 2017-09-16 DIAGNOSIS — N390 Urinary tract infection, site not specified: Secondary | ICD-10-CM | POA: Diagnosis not present

## 2017-09-16 DIAGNOSIS — I700 Atherosclerosis of aorta: Secondary | ICD-10-CM | POA: Diagnosis not present

## 2017-09-16 DIAGNOSIS — E039 Hypothyroidism, unspecified: Secondary | ICD-10-CM | POA: Diagnosis not present

## 2017-09-16 DIAGNOSIS — Z0000 Encounter for general adult medical examination without abnormal findings: Secondary | ICD-10-CM | POA: Diagnosis not present

## 2017-09-16 DIAGNOSIS — M170 Bilateral primary osteoarthritis of knee: Secondary | ICD-10-CM | POA: Diagnosis not present

## 2017-09-17 ENCOUNTER — Ambulatory Visit
Admission: RE | Admit: 2017-09-17 | Discharge: 2017-09-17 | Disposition: A | Discharge status: Home / Self Care | Payer: Medicare HMO | Source: Ambulatory Visit | Attending: Oncology | Admitting: Oncology

## 2017-09-17 DIAGNOSIS — D61818 Other pancytopenia: Secondary | ICD-10-CM

## 2017-09-17 DIAGNOSIS — D696 Thrombocytopenia, unspecified: Secondary | ICD-10-CM

## 2017-09-17 DIAGNOSIS — C50512 Malignant neoplasm of lower-outer quadrant of left female breast: Secondary | ICD-10-CM

## 2017-09-17 DIAGNOSIS — G620 Drug-induced polyneuropathy: Secondary | ICD-10-CM

## 2017-09-17 DIAGNOSIS — Z170 Estrogen receptor positive status [ER+]: Secondary | ICD-10-CM

## 2017-09-17 DIAGNOSIS — R928 Other abnormal and inconclusive findings on diagnostic imaging of breast: Secondary | ICD-10-CM | POA: Diagnosis not present

## 2017-09-17 DIAGNOSIS — T451X5A Adverse effect of antineoplastic and immunosuppressive drugs, initial encounter: Secondary | ICD-10-CM

## 2017-09-17 DIAGNOSIS — C50211 Malignant neoplasm of upper-inner quadrant of right female breast: Secondary | ICD-10-CM

## 2017-09-19 MED ORDER — BUPIVACAINE LIPOSOME 1.3 % IJ SUSP
20.0000 mL | Freq: Once | INTRAMUSCULAR | Status: DC
  Filled 2017-09-19: qty 20

## 2017-09-19 MED ORDER — TRANEXAMIC ACID 1000 MG/10ML IV SOLN
2000.0000 mg | Freq: Once | INTRAVENOUS | Status: DC
  Filled 2017-09-19: qty 20

## 2017-09-19 NOTE — Anesthesia Preprocedure Evaluation (Addendum)
Anesthesia Evaluation  Patient identified by MRN, date of birth, ID band Patient awake    Reviewed: Allergy & Precautions, NPO status , Patient's Chart, lab work & pertinent test results  History of Anesthesia Complications (+) PONV  Airway Mallampati: II  TM Distance: >3 FB Neck ROM: Full    Dental no notable dental hx.    Pulmonary neg pulmonary ROS,    Pulmonary exam normal breath sounds clear to auscultation       Cardiovascular Exercise Tolerance: Good hypertension, Pt. on medications Normal cardiovascular exam Rhythm:Regular Rate:Normal  Echo 2015 - Ef 60-65%   Neuro/Psych negative neurological ROS  negative psych ROS   GI/Hepatic negative GI ROS, Neg liver ROS,   Endo/Other  negative endocrine ROS  Renal/GU negative Renal ROS     Musculoskeletal  (+) Arthritis , Osteoarthritis,    Abdominal (+) + obese,   Peds  Hematology negative hematology ROS (+)   Anesthesia Other Findings   Reproductive/Obstetrics                           Lab Results  Component Value Date   WBC 3.1 (L) 09/14/2017   HGB 12.4 09/14/2017   HCT 38.9 09/14/2017   MCV 91.3 09/14/2017   PLT 261 09/14/2017   Lab Results  Component Value Date   CREATININE 0.94 09/14/2017   BUN 23 (H) 09/14/2017   NA 139 09/14/2017   K 4.2 09/14/2017   CL 104 09/14/2017   CO2 30 09/14/2017    Anesthesia Physical Anesthesia Plan  ASA: III  Anesthesia Plan: Spinal and Regional   Post-op Pain Management:  Regional for Post-op pain   Induction:   PONV Risk Score and Plan: Treatment may vary due to age or medical condition and Ondansetron  Airway Management Planned: Mask, Natural Airway and Nasal Cannula  Additional Equipment:   Intra-op Plan:   Post-operative Plan:   Informed Consent: I have reviewed the patients History and Physical, chart, labs and discussed the procedure including the risks, benefits and  alternatives for the proposed anesthesia with the patient or authorized representative who has indicated his/her understanding and acceptance.     Plan Discussed with: CRNA  Anesthesia Plan Comments:         Anesthesia Quick Evaluation

## 2017-09-19 NOTE — H&P (Signed)
Name  Monica [name], Monica [name] [phone], F) DOB    1946/04/26        Chief Complaint Left Knee Pain H&P left TKA 09-20-2017  Patient's Pharmacies [facility] [address] [facility]): [place] ([place]) [place] [address], Ph [phone], Fax [phone]  Vitals Ht:        5 ft 6 in  Wt:       222 lbs  BMI:     35.8 BP:      138/72 sitting R arm Pulse:  68 bpm   Allergies Reviewed Allergies CODEINE: - Breathing Issues, Sweating      Medications Reviewed Medications amLODIPine besylate (bulk) 07/08/17   entered       Faith Brown anastrozole 07/08/17   entered       Loura Back cloNIDine 0.1 mg/24 hr weekly transdermal patch Apply 1 patch(es) every week by transdermal route. 07/08/17   entered       Loura Back diclofenac 1 % topical gel APPLY 2 GRAM TO THE AFFECTED AREA(S) BY TOPICAL ROUTE 4 TIMES PER DAY 07/08/17   entered       Faith Brown Diovan HCT 320 mg-25 mg tablet Take 1 tablet(s) every day by oral route. 07/08/17   entered       Loura Back gabapentin 300 mg capsule Take 1 capsule(s) 3 times a day by oral route. 07/08/17   entered       Loura Back ginkgo biloba 07/08/17   entered       Loura Back levothyroxine 07/08/17   entered       Faith Brown meloxicam 07/08/17   entered       Faith Brown omega 3-dha 300 mg-epa 400 mg-fish oil 1,000 mg capsule Take by oral route. 07/08/17   entered       Loura Back oxyCODONE-acetaminophen 5 mg-325 mg tablet Take 1 tablet(s) every 6 hours by oral route. 07/08/17   entered       Faith Brown potassium chloride 07/08/17   entered       Loura Back Stool Softener 07/08/17   entered       Loura Back Vitamin D3 07/08/17   entered       Loura Back   Problems Osteoarthritis of right knee joint Osteoarthritis of left knee joint   Family History Reviewed Family History Mother - Hypertensive disorder             - Diabetes mellitus             - Mother deceased - 09-Aug-1991 Sister   - History of  carcinoma Brother            - History of carcinoma Father  - Father deceased - 12/28/99  Social History Reviewed Social History Smoking Status: Never smoker Non-smoker Chewing tobacco: none Alcohol intake: None Hand Dominance: Right Work related injury?: N Advance directive: Y (Notes: Living Will) Medical Power of Attorney: N  Surgical History Reviewed Surgical History Biopsy of nerve - Right Foot - 1998 Hysterectomy - 1992 Lumpectomy of breast - Right 04/02/2017, Left 02/12/2014 Knee Arthroscopy - 11/03/2010 - Right Knee Lumbar Laminectomy - 08-08-1989  GYN History Most Recent Mammogram: 02/24/2017.  Past Medical History Reviewed Past Medical History Cancer: Y - breast cancer Hypertension: Y Previous Cortisone Injection(s): Y Notes: Post Chemotherapy Neuralgia,  Hypothyroidism,  Degenerative Arthritis Lumbar Spine   HPI The patient is here today for a pre-operative History and Physical. They are scheduled for left  total knee replacement on 09-20-2017 with Dr. Wynelle Link at [facility]. The patient has been seen and followed for bilateral knee osteoarthritis. She has been diagnosed with osteoarthritis of the knees and has been treated conservatively using cortisone injections. Previous Injections have helped temporarily. Alleviating Factors: narcotics; NSAIDs; She takes Oxycodone for treatment of back pain. She has also been using some Voltaren gel on her knees. Aggravating Factors include weightbearing exercise. Symptoms include catching/locking; popping/clicking; grinding She reports that she has had Synvisc in the past. They have "knocked the edge off" but have not helped significantly.  Previous PT has helped temporarily. She did therapy at [facility] which helped short term. She reports that she just finished radiation for treatment of breast cancer. She states that she is to the point she would like to go ahead and proceed with surgery. Her knees have gotten  progressively worse. The injections provided minimal benefit. She is at a point where it is hurting at all times and limiting what she can and cannot do. She had a stage as he is ready to go ahead and get her LEFT knee replaced as that one is hurting more than the RIGHT.   ROS Constitutional: Constitutional: no fever, chills, night sweats, or significant weight loss; Positive for hot flashes which she attributes to her medication.  Cardiovascular: Cardiovascular: no palpitations or chest pain.  Respiratory: Respiratory: no cough or shortness of breath and No COPD.  Gastrointestinal: Gastrointestinal: no vomiting or nausea.  Musculoskeletal: Musculoskeletal: Joint Pain.  Neurologic: Neurologic: no numbness, tingling, or difficulty with balance.   Physical Exam Patient is a 72 year old female.  General Mental Status - Alert, cooperative and good historian. General Appearance - pleasant, Not in acute distress. Orientation - Oriented X3. Build & Nutrition - Well nourished and Well developed.  Head and Neck Head - normocephalic, atraumatic . Neck Global Assessment - supple, no bruit auscultated on the right, no bruit auscultated on the left.  Eye Pupil - Bilateral - PERR Motion - Bilateral - EOMI.  Chest and Lung Exam Auscultation Breath sounds - clear at anterior chest wall and clear at posterior chest wall. Adventitious sounds - No Adventitious sounds.  Cardiovascular Auscultation Rhythm - Regular rate and rhythm. Heart Sounds - S1 WNL and S2 WNL. Murmurs & Other Heart Sounds - Auscultation of the heart reveals - No Murmurs.  Abdomen Palpation/Percussion Tenderness - Abdomen is non-tender to palpation. Abdomen is soft. Auscultation Auscultation of the abdomen reveals - Bowel sounds normal.  Genitourinary Note: Not done, not pertinent to present illness  Musculoskeletal LEFT knee shows no effusion. Range of motion about 10-110. There is moderate  crepitus on range of motion with tenderness medial greater than lateral and no instability. RIGHT knee range about 5-115 with marked crepitus on range of motion and tenderness medial greater than lateral with no instability noted. Pulses, sensation and motor are intact both lower extremities. She has significant antalgic gait pattern.  Her radiographs are reviewed she has severe bone-on-bone arthritis of all 3 compartments of both knees.  Assessment / Plan 1. Osteoarthritis of left knee joint M17.12: Unilateral primary osteoarthritis, left knee  Patient Instructions Surgical Plans: Left Total Knee Replacement Disposition: Home with Husband, HHPT to start following discharge from hospital. PCP: Dr. Maury Dus - cleared to proceed with surgery Topical TXA - History of Bilateral Breast Cancer AVOID LEFT ARM FOR BP'S AND IV'S Anesthesia Issues: None Patient was instructed on what medications to stop prior to surgery. - Follow up visit in  2 weeks with Dr. Wynelle Link - Begin physical therapy following surgery - Pre-operative lab work as pre Pre-Surgical Testing - Prescriptions will be provided in hospital at time of discharge  Return to [place], [place] for [place] at [facility] on 10/05/2017 at 02:30 PM  Encounter signed-off by Mickel Crow, PA-C

## 2017-09-20 ENCOUNTER — Encounter (HOSPITAL_COMMUNITY): Payer: Self-pay | Admitting: *Deleted

## 2017-09-20 ENCOUNTER — Inpatient Hospital Stay (HOSPITAL_COMMUNITY)
Admission: RE | Admit: 2017-09-20 | Discharge: 2017-09-22 | DRG: 470 | Disposition: A | Discharge status: Home Health | Payer: Medicare HMO | Source: Ambulatory Visit | Attending: Orthopedic Surgery | Admitting: Orthopedic Surgery

## 2017-09-20 ENCOUNTER — Encounter (HOSPITAL_COMMUNITY)
Admission: RE | Disposition: A | Discharge status: Home Health | Payer: Self-pay | Source: Ambulatory Visit | Attending: Orthopedic Surgery

## 2017-09-20 ENCOUNTER — Inpatient Hospital Stay (HOSPITAL_COMMUNITY): Payer: Medicare HMO | Admitting: Anesthesiology

## 2017-09-20 ENCOUNTER — Other Ambulatory Visit: Payer: Self-pay

## 2017-09-20 ENCOUNTER — Other Ambulatory Visit: Payer: Self-pay | Admitting: Oncology

## 2017-09-20 DIAGNOSIS — Z9221 Personal history of antineoplastic chemotherapy: Secondary | ICD-10-CM | POA: Diagnosis not present

## 2017-09-20 DIAGNOSIS — M4696 Unspecified inflammatory spondylopathy, lumbar region: Secondary | ICD-10-CM | POA: Diagnosis present

## 2017-09-20 DIAGNOSIS — Z6835 Body mass index (BMI) 35.0-35.9, adult: Secondary | ICD-10-CM | POA: Diagnosis not present

## 2017-09-20 DIAGNOSIS — E669 Obesity, unspecified: Secondary | ICD-10-CM | POA: Diagnosis present

## 2017-09-20 DIAGNOSIS — Z791 Long term (current) use of non-steroidal anti-inflammatories (NSAID): Secondary | ICD-10-CM

## 2017-09-20 DIAGNOSIS — Z79891 Long term (current) use of opiate analgesic: Secondary | ICD-10-CM

## 2017-09-20 DIAGNOSIS — M1710 Unilateral primary osteoarthritis, unspecified knee: Secondary | ICD-10-CM

## 2017-09-20 DIAGNOSIS — Z8249 Family history of ischemic heart disease and other diseases of the circulatory system: Secondary | ICD-10-CM

## 2017-09-20 DIAGNOSIS — Z79890 Hormone replacement therapy (postmenopausal): Secondary | ICD-10-CM | POA: Diagnosis not present

## 2017-09-20 DIAGNOSIS — R269 Unspecified abnormalities of gait and mobility: Secondary | ICD-10-CM | POA: Diagnosis not present

## 2017-09-20 DIAGNOSIS — Z90710 Acquired absence of both cervix and uterus: Secondary | ICD-10-CM

## 2017-09-20 DIAGNOSIS — Z79899 Other long term (current) drug therapy: Secondary | ICD-10-CM

## 2017-09-20 DIAGNOSIS — C50912 Malignant neoplasm of unspecified site of left female breast: Secondary | ICD-10-CM | POA: Diagnosis present

## 2017-09-20 DIAGNOSIS — E039 Hypothyroidism, unspecified: Secondary | ICD-10-CM | POA: Diagnosis present

## 2017-09-20 DIAGNOSIS — M170 Bilateral primary osteoarthritis of knee: Principal | ICD-10-CM | POA: Diagnosis present

## 2017-09-20 DIAGNOSIS — I10 Essential (primary) hypertension: Secondary | ICD-10-CM | POA: Diagnosis not present

## 2017-09-20 DIAGNOSIS — M1712 Unilateral primary osteoarthritis, left knee: Secondary | ICD-10-CM | POA: Diagnosis not present

## 2017-09-20 DIAGNOSIS — G8918 Other acute postprocedural pain: Secondary | ICD-10-CM | POA: Diagnosis not present

## 2017-09-20 DIAGNOSIS — Z923 Personal history of irradiation: Secondary | ICD-10-CM

## 2017-09-20 DIAGNOSIS — M179 Osteoarthritis of knee, unspecified: Secondary | ICD-10-CM | POA: Diagnosis present

## 2017-09-20 DIAGNOSIS — E876 Hypokalemia: Secondary | ICD-10-CM | POA: Diagnosis not present

## 2017-09-20 HISTORY — PX: TOTAL KNEE ARTHROPLASTY: SHX125

## 2017-09-20 LAB — TYPE AND SCREEN
ABO/RH(D): B POS
ANTIBODY SCREEN: NEGATIVE

## 2017-09-20 LAB — GLUCOSE, CAPILLARY: GLUCOSE-CAPILLARY: 87 mg/dL (ref 65–99)

## 2017-09-20 SURGERY — ARTHROPLASTY, KNEE, TOTAL
Anesthesia: Regional | Site: Knee | Laterality: Left

## 2017-09-20 MED ORDER — METHOCARBAMOL 1000 MG/10ML IJ SOLN
500.0000 mg | Freq: Four times a day (QID) | INTRAVENOUS | Status: DC | PRN
  Filled 2017-09-20: qty 5

## 2017-09-20 MED ORDER — AMLODIPINE BESYLATE 5 MG PO TABS
5.0000 mg | ORAL_TABLET | Freq: Every day | ORAL | Status: DC
  Administered 2017-09-21 – 2017-09-22 (×2): 5 mg via ORAL
  Filled 2017-09-20 (×2): qty 1

## 2017-09-20 MED ORDER — PROPOFOL 10 MG/ML IV BOLUS
INTRAVENOUS | Status: AC
  Filled 2017-09-20: qty 60

## 2017-09-20 MED ORDER — DEXAMETHASONE SODIUM PHOSPHATE 10 MG/ML IJ SOLN
10.0000 mg | Freq: Once | INTRAMUSCULAR | Status: AC
  Administered 2017-09-20: 10 mg via INTRAVENOUS

## 2017-09-20 MED ORDER — METHOCARBAMOL 500 MG PO TABS
500.0000 mg | ORAL_TABLET | Freq: Four times a day (QID) | ORAL | Status: DC | PRN
  Administered 2017-09-20 – 2017-09-21 (×3): 500 mg via ORAL
  Filled 2017-09-20 (×3): qty 1

## 2017-09-20 MED ORDER — SODIUM CHLORIDE 0.9 % IR SOLN
Status: DC | PRN
  Administered 2017-09-20: 1000 mL

## 2017-09-20 MED ORDER — HYDROMORPHONE HCL 2 MG PO TABS
2.0000 mg | ORAL_TABLET | ORAL | Status: DC | PRN
  Administered 2017-09-20 (×3): 2 mg via ORAL
  Administered 2017-09-21 – 2017-09-22 (×6): 4 mg via ORAL
  Filled 2017-09-20: qty 2
  Filled 2017-09-20: qty 1
  Filled 2017-09-20 (×4): qty 2
  Filled 2017-09-20: qty 1
  Filled 2017-09-20: qty 2
  Filled 2017-09-20: qty 1

## 2017-09-20 MED ORDER — SODIUM CHLORIDE 0.9 % IJ SOLN
INTRAMUSCULAR | Status: DC | PRN
  Administered 2017-09-20 (×2): 30 mL

## 2017-09-20 MED ORDER — HYDROMORPHONE HCL 1 MG/ML IJ SOLN: 0.2500 mg | INTRAMUSCULAR | Status: DC | PRN

## 2017-09-20 MED ORDER — BISACODYL 10 MG RE SUPP: 10.0000 mg | Freq: Every day | RECTAL | Status: DC | PRN

## 2017-09-20 MED ORDER — ONDANSETRON HCL 4 MG PO TABS
4.0000 mg | ORAL_TABLET | Freq: Four times a day (QID) | ORAL | Status: DC | PRN
  Administered 2017-09-22 (×2): 4 mg via ORAL
  Filled 2017-09-20 (×2): qty 1

## 2017-09-20 MED ORDER — SODIUM CHLORIDE 0.9 % IJ SOLN
INTRAMUSCULAR | Status: AC
  Filled 2017-09-20: qty 50

## 2017-09-20 MED ORDER — POTASSIUM CHLORIDE CRYS ER 20 MEQ PO TBCR
20.0000 meq | EXTENDED_RELEASE_TABLET | Freq: Every day | ORAL | Status: DC
  Administered 2017-09-20 – 2017-09-21 (×2): 20 meq via ORAL
  Filled 2017-09-20 (×3): qty 1

## 2017-09-20 MED ORDER — LIDOCAINE 2% (20 MG/ML) 5 ML SYRINGE
INTRAMUSCULAR | Status: DC | PRN
  Administered 2017-09-20: 40 mg via INTRAVENOUS

## 2017-09-20 MED ORDER — CEFAZOLIN SODIUM-DEXTROSE 2-4 GM/100ML-% IV SOLN
2.0000 g | INTRAVENOUS | Status: AC
  Administered 2017-09-20: 2 g via INTRAVENOUS
  Filled 2017-09-20: qty 100

## 2017-09-20 MED ORDER — FENTANYL CITRATE (PF) 100 MCG/2ML IJ SOLN
INTRAMUSCULAR | Status: DC | PRN
  Administered 2017-09-20 (×2): 50 ug via INTRAVENOUS

## 2017-09-20 MED ORDER — DOCUSATE SODIUM 100 MG PO CAPS
100.0000 mg | ORAL_CAPSULE | Freq: Two times a day (BID) | ORAL | Status: DC
  Administered 2017-09-20 – 2017-09-21 (×3): 100 mg via ORAL
  Filled 2017-09-20 (×3): qty 1

## 2017-09-20 MED ORDER — PHENOL 1.4 % MT LIQD
1.0000 | OROMUCOSAL | Status: DC | PRN
  Filled 2017-09-20: qty 177

## 2017-09-20 MED ORDER — ACETAMINOPHEN 500 MG PO TABS
1000.0000 mg | ORAL_TABLET | Freq: Four times a day (QID) | ORAL | Status: AC
  Administered 2017-09-20 – 2017-09-21 (×4): 1000 mg via ORAL
  Filled 2017-09-20 (×3): qty 2

## 2017-09-20 MED ORDER — ONDANSETRON HCL 4 MG/2ML IJ SOLN
4.0000 mg | Freq: Four times a day (QID) | INTRAMUSCULAR | Status: DC | PRN
Start: 2017-09-20 — End: 2017-09-22
  Administered 2017-09-21 (×2): 4 mg via INTRAVENOUS
  Filled 2017-09-20 (×2): qty 2

## 2017-09-20 MED ORDER — POLYETHYLENE GLYCOL 3350 17 G PO PACK
17.0000 g | PACK | Freq: Every day | ORAL | Status: DC | PRN
  Filled 2017-09-20: qty 1

## 2017-09-20 MED ORDER — LEVOTHYROXINE SODIUM 75 MCG PO TABS
75.0000 ug | ORAL_TABLET | Freq: Every day | ORAL | Status: DC
  Administered 2017-09-21 – 2017-09-22 (×2): 75 ug via ORAL
  Filled 2017-09-20 (×2): qty 1

## 2017-09-20 MED ORDER — PROMETHAZINE HCL 25 MG/ML IJ SOLN: 6.2500 mg | INTRAMUSCULAR | Status: DC | PRN

## 2017-09-20 MED ORDER — HYDROMORPHONE HCL 1 MG/ML IJ SOLN
0.5000 mg | INTRAMUSCULAR | Status: DC | PRN
  Administered 2017-09-20: 0.5 mg via INTRAVENOUS
  Administered 2017-09-21 – 2017-09-22 (×3): 1 mg via INTRAVENOUS
  Filled 2017-09-20 (×4): qty 1

## 2017-09-20 MED ORDER — ONDANSETRON HCL 4 MG/2ML IJ SOLN
INTRAMUSCULAR | Status: AC
  Filled 2017-09-20: qty 2

## 2017-09-20 MED ORDER — RIVAROXABAN 10 MG PO TABS
10.0000 mg | ORAL_TABLET | Freq: Every day | ORAL | Status: DC
  Administered 2017-09-21 – 2017-09-22 (×2): 10 mg via ORAL
  Filled 2017-09-20 (×2): qty 1

## 2017-09-20 MED ORDER — TRAMADOL HCL 50 MG PO TABS
50.0000 mg | ORAL_TABLET | Freq: Four times a day (QID) | ORAL | Status: DC | PRN
  Administered 2017-09-21: 100 mg via ORAL
  Filled 2017-09-20 (×2): qty 2

## 2017-09-20 MED ORDER — METOCLOPRAMIDE HCL 5 MG PO TABS: 5.0000 mg | ORAL_TABLET | Freq: Three times a day (TID) | ORAL | Status: DC | PRN

## 2017-09-20 MED ORDER — ACETAMINOPHEN 10 MG/ML IV SOLN: 1000.0000 mg | Freq: Once | INTRAVENOUS | Status: DC | PRN

## 2017-09-20 MED ORDER — MIDAZOLAM HCL 5 MG/5ML IJ SOLN
INTRAMUSCULAR | Status: DC | PRN
  Administered 2017-09-20 (×2): 1 mg via INTRAVENOUS

## 2017-09-20 MED ORDER — LIDOCAINE 2% (20 MG/ML) 5 ML SYRINGE
INTRAMUSCULAR | Status: AC
  Filled 2017-09-20: qty 5

## 2017-09-20 MED ORDER — MIDAZOLAM HCL 2 MG/2ML IJ SOLN
INTRAMUSCULAR | Status: AC
  Filled 2017-09-20: qty 2

## 2017-09-20 MED ORDER — VALSARTAN-HYDROCHLOROTHIAZIDE 320-25 MG PO TABS: 1.0000 | ORAL_TABLET | Freq: Every day | ORAL | Status: DC

## 2017-09-20 MED ORDER — DIPHENHYDRAMINE HCL 12.5 MG/5ML PO ELIX: 12.5000 mg | ORAL_SOLUTION | ORAL | Status: DC | PRN

## 2017-09-20 MED ORDER — SODIUM CHLORIDE 0.9 % IV SOLN
INTRAVENOUS | Status: DC
  Administered 2017-09-20: 12:00:00 via INTRAVENOUS

## 2017-09-20 MED ORDER — EPHEDRINE SULFATE-NACL 50-0.9 MG/10ML-% IV SOSY
PREFILLED_SYRINGE | INTRAVENOUS | Status: DC | PRN
  Administered 2017-09-20 (×2): 5 mg via INTRAVENOUS
  Administered 2017-09-20: 15 mg via INTRAVENOUS

## 2017-09-20 MED ORDER — FENTANYL CITRATE (PF) 100 MCG/2ML IJ SOLN
INTRAMUSCULAR | Status: AC
  Filled 2017-09-20: qty 2

## 2017-09-20 MED ORDER — CHLORHEXIDINE GLUCONATE 4 % EX LIQD: 60.0000 mL | Freq: Once | CUTANEOUS | Status: DC

## 2017-09-20 MED ORDER — EPHEDRINE 5 MG/ML INJ
INTRAVENOUS | Status: AC
  Filled 2017-09-20: qty 10

## 2017-09-20 MED ORDER — HYDROCODONE-ACETAMINOPHEN 7.5-325 MG PO TABS: 1.0000 | ORAL_TABLET | Freq: Once | ORAL | Status: DC | PRN

## 2017-09-20 MED ORDER — TRANEXAMIC ACID 1000 MG/10ML IV SOLN
INTRAVENOUS | Status: AC | PRN
  Administered 2017-09-20: 2000 mg via TOPICAL

## 2017-09-20 MED ORDER — ANASTROZOLE 1 MG PO TABS
1.0000 mg | ORAL_TABLET | Freq: Every day | ORAL | Status: DC
  Administered 2017-09-21 – 2017-09-22 (×2): 1 mg via ORAL
  Filled 2017-09-20 (×2): qty 1

## 2017-09-20 MED ORDER — CEFAZOLIN SODIUM-DEXTROSE 2-4 GM/100ML-% IV SOLN
2.0000 g | Freq: Four times a day (QID) | INTRAVENOUS | Status: AC
  Administered 2017-09-20 (×2): 2 g via INTRAVENOUS
  Filled 2017-09-20 (×2): qty 100

## 2017-09-20 MED ORDER — CLONIDINE HCL 0.1 MG/24HR TD PTWK: 0.1000 mg | MEDICATED_PATCH | TRANSDERMAL | Status: DC

## 2017-09-20 MED ORDER — PROPOFOL 500 MG/50ML IV EMUL
INTRAVENOUS | Status: DC | PRN
  Administered 2017-09-20: 75 ug/kg/min via INTRAVENOUS

## 2017-09-20 MED ORDER — GABAPENTIN 300 MG PO CAPS
300.0000 mg | ORAL_CAPSULE | Freq: Once | ORAL | Status: AC
  Administered 2017-09-20: 300 mg via ORAL
  Filled 2017-09-20: qty 1

## 2017-09-20 MED ORDER — METOCLOPRAMIDE HCL 5 MG/ML IJ SOLN
5.0000 mg | Freq: Three times a day (TID) | INTRAMUSCULAR | Status: DC | PRN
  Administered 2017-09-22: 10 mg via INTRAVENOUS
  Filled 2017-09-20: qty 2

## 2017-09-20 MED ORDER — MEPERIDINE HCL 50 MG/ML IJ SOLN: 6.2500 mg | INTRAMUSCULAR | Status: DC | PRN

## 2017-09-20 MED ORDER — ROPIVACAINE HCL 7.5 MG/ML IJ SOLN
INTRAMUSCULAR | Status: DC | PRN
  Administered 2017-09-20: 20 mL via PERINEURAL

## 2017-09-20 MED ORDER — MENTHOL 3 MG MT LOZG: 1.0000 | LOZENGE | OROMUCOSAL | Status: DC | PRN

## 2017-09-20 MED ORDER — STERILE WATER FOR IRRIGATION IR SOLN
Status: DC | PRN
  Administered 2017-09-20: 2000 mL

## 2017-09-20 MED ORDER — IRBESARTAN 150 MG PO TABS
300.0000 mg | ORAL_TABLET | Freq: Every day | ORAL | Status: DC
  Administered 2017-09-21 – 2017-09-22 (×2): 300 mg via ORAL
  Filled 2017-09-20 (×2): qty 2

## 2017-09-20 MED ORDER — HYDROCHLOROTHIAZIDE 25 MG PO TABS
25.0000 mg | ORAL_TABLET | Freq: Every day | ORAL | Status: DC
  Administered 2017-09-21 – 2017-09-22 (×2): 25 mg via ORAL
  Filled 2017-09-20 (×2): qty 1

## 2017-09-20 MED ORDER — BUPIVACAINE LIPOSOME 1.3 % IJ SUSP
INTRAMUSCULAR | Status: DC | PRN
  Administered 2017-09-20 (×2): 10 mL

## 2017-09-20 MED ORDER — ONDANSETRON HCL 4 MG/2ML IJ SOLN
INTRAMUSCULAR | Status: DC | PRN
  Administered 2017-09-20: 4 mg via INTRAVENOUS

## 2017-09-20 MED ORDER — ACETAMINOPHEN 10 MG/ML IV SOLN
1000.0000 mg | Freq: Once | INTRAVENOUS | Status: AC
  Administered 2017-09-20: 1000 mg via INTRAVENOUS
  Filled 2017-09-20: qty 100

## 2017-09-20 MED ORDER — SODIUM CHLORIDE 0.9 % IJ SOLN
INTRAMUSCULAR | Status: AC
  Filled 2017-09-20: qty 10

## 2017-09-20 MED ORDER — DEXAMETHASONE SODIUM PHOSPHATE 10 MG/ML IJ SOLN
10.0000 mg | Freq: Once | INTRAMUSCULAR | Status: AC
  Administered 2017-09-21: 10 mg via INTRAVENOUS
  Filled 2017-09-20: qty 1

## 2017-09-20 MED ORDER — LACTATED RINGERS IV SOLN
INTRAVENOUS | Status: DC
  Administered 2017-09-20 (×2): via INTRAVENOUS

## 2017-09-20 MED ORDER — FLEET ENEMA 7-19 GM/118ML RE ENEM: 1.0000 | ENEMA | Freq: Once | RECTAL | Status: DC | PRN

## 2017-09-20 MED ORDER — BUPIVACAINE IN DEXTROSE 0.75-8.25 % IT SOLN
INTRATHECAL | Status: DC | PRN
  Administered 2017-09-20: 2 mL via INTRATHECAL

## 2017-09-20 MED ORDER — DEXAMETHASONE SODIUM PHOSPHATE 10 MG/ML IJ SOLN
INTRAMUSCULAR | Status: AC
  Filled 2017-09-20: qty 1

## 2017-09-20 MED ORDER — GABAPENTIN 300 MG PO CAPS
300.0000 mg | ORAL_CAPSULE | Freq: Three times a day (TID) | ORAL | Status: DC
  Administered 2017-09-20 – 2017-09-22 (×7): 300 mg via ORAL
  Filled 2017-09-20 (×7): qty 1

## 2017-09-20 SURGICAL SUPPLY — 52 items
BAG DECANTER FOR FLEXI CONT (MISCELLANEOUS) ×2 IMPLANT
BAG ZIPLOCK 12X15 (MISCELLANEOUS) ×2 IMPLANT
BANDAGE ACE 6X5 VEL STRL LF (GAUZE/BANDAGES/DRESSINGS) ×2 IMPLANT
BLADE SAG 18X100X1.27 (BLADE) ×2 IMPLANT
BLADE SAW SGTL 11.0X1.19X90.0M (BLADE) ×2 IMPLANT
BOWL SMART MIX CTS (DISPOSABLE) ×2 IMPLANT
CAPT KNEE TOTAL 3 ATTUNE ×2 IMPLANT
CEMENT HV SMART SET (Cement) ×4 IMPLANT
COVER SURGICAL LIGHT HANDLE (MISCELLANEOUS) ×2 IMPLANT
CUFF TOURN SGL QUICK 34 (TOURNIQUET CUFF) ×1
CUFF TRNQT CYL 34X4X40X1 (TOURNIQUET CUFF) ×1 IMPLANT
DECANTER SPIKE VIAL GLASS SM (MISCELLANEOUS) ×2 IMPLANT
DRAPE TOP 10253 STERILE (DRAPES) ×2 IMPLANT
DRAPE U-SHAPE 47X51 STRL (DRAPES) ×2 IMPLANT
DRSG ADAPTIC 3X8 NADH LF (GAUZE/BANDAGES/DRESSINGS) ×2 IMPLANT
DRSG PAD ABDOMINAL 8X10 ST (GAUZE/BANDAGES/DRESSINGS) ×2 IMPLANT
DURAPREP 26ML APPLICATOR (WOUND CARE) ×2 IMPLANT
ELECT REM PT RETURN 15FT ADLT (MISCELLANEOUS) ×2 IMPLANT
EVACUATOR 1/8 PVC DRAIN (DRAIN) ×2 IMPLANT
GAUZE SPONGE 4X4 12PLY STRL (GAUZE/BANDAGES/DRESSINGS) ×2 IMPLANT
GLOVE BIO SURGEON STRL SZ8 (GLOVE) ×2 IMPLANT
GLOVE BIOGEL PI IND STRL 6.5 (GLOVE) ×1 IMPLANT
GLOVE BIOGEL PI IND STRL 7.5 (GLOVE) ×4 IMPLANT
GLOVE BIOGEL PI IND STRL 8 (GLOVE) ×1 IMPLANT
GLOVE BIOGEL PI INDICATOR 6.5 (GLOVE) ×1
GLOVE BIOGEL PI INDICATOR 7.5 (GLOVE) ×4
GLOVE BIOGEL PI INDICATOR 8 (GLOVE) ×1
GLOVE ECLIPSE 7.5 STRL STRAW (GLOVE) ×2 IMPLANT
GLOVE SURG SS PI 6.5 STRL IVOR (GLOVE) ×2 IMPLANT
GLOVE SURG SS PI 7.5 STRL IVOR (GLOVE) ×2 IMPLANT
GOWN SPEC L3 XXLG W/TWL (GOWN DISPOSABLE) ×2 IMPLANT
GOWN STRL REUS W/TWL LRG LVL3 (GOWN DISPOSABLE) ×4 IMPLANT
GOWN STRL REUS W/TWL XL LVL3 (GOWN DISPOSABLE) ×2 IMPLANT
HANDPIECE INTERPULSE COAX TIP (DISPOSABLE) ×1
IMMOBILIZER KNEE 20 (SOFTGOODS) ×2
IMMOBILIZER KNEE 20 THIGH 36 (SOFTGOODS) ×1 IMPLANT
MANIFOLD NEPTUNE II (INSTRUMENTS) ×2 IMPLANT
PACK TOTAL KNEE CUSTOM (KITS) ×2 IMPLANT
PAD ABD 8X10 STRL (GAUZE/BANDAGES/DRESSINGS) ×2 IMPLANT
PADDING CAST COTTON 6X4 STRL (CAST SUPPLIES) ×6 IMPLANT
POSITIONER SURGICAL ARM (MISCELLANEOUS) ×2 IMPLANT
SET HNDPC FAN SPRY TIP SCT (DISPOSABLE) ×1 IMPLANT
STRIP CLOSURE SKIN 1/2X4 (GAUZE/BANDAGES/DRESSINGS) ×4 IMPLANT
SUT MNCRL AB 4-0 PS2 18 (SUTURE) ×2 IMPLANT
SUT STRATAFIX 0 PDS 27 VIOLET (SUTURE) ×2
SUT VIC AB 2-0 CT1 27 (SUTURE) ×3
SUT VIC AB 2-0 CT1 TAPERPNT 27 (SUTURE) ×3 IMPLANT
SUTURE STRATFX 0 PDS 27 VIOLET (SUTURE) ×1 IMPLANT
SYR 30ML LL (SYRINGE) ×4 IMPLANT
TRAY FOLEY CATH SILVER 14FR (SET/KITS/TRAYS/PACK) ×2 IMPLANT
WRAP KNEE MAXI GEL POST OP (GAUZE/BANDAGES/DRESSINGS) ×2 IMPLANT
YANKAUER SUCT BULB TIP 10FT TU (MISCELLANEOUS) ×2 IMPLANT

## 2017-09-20 NOTE — Anesthesia Postprocedure Evaluation (Signed)
Anesthesia Post Note  Patient: Monica [name]  Procedure(s) Performed: LEFT TOTAL KNEE ARTHROPLASTY (Left Knee)     Patient location during evaluation: PACU Anesthesia Type: Regional and Spinal Level of consciousness: oriented and awake and alert Pain management: pain level controlled Vital Signs Assessment: post-procedure vital signs reviewed and stable Respiratory status: spontaneous breathing, respiratory function stable and patient connected to nasal cannula oxygen Cardiovascular status: blood pressure returned to baseline and stable Postop Assessment: no headache, no backache and no apparent nausea or vomiting Anesthetic complications: no    Last Vitals:  Vitals:   09/20/17 1000 09/20/17 1015  BP: 134/69 133/66  Pulse: 66 66  Resp: 14 15  Temp:  36.6 C  SpO2: 100% 100%    Last Pain:  Vitals:   09/20/17 1015  PainSc: 0-No pain                 Barnet Glasgow

## 2017-09-20 NOTE — Op Note (Signed)
OPERATIVE REPORT-TOTAL KNEE ARTHROPLASTY   Pre-operative diagnosis- Osteoarthritis  Left knee(s)  Post-operative diagnosis- Osteoarthritis Left knee(s)  Procedure-  Left  Total Knee Arthroplasty  Surgeon- Dione Plover. Abrial Arrighi, MD  Assistant- Ardeen Jourdain, PA-C   Anesthesia-  Adductor canal block and spinal  EBL-50 mL   Drains Hemovac  Tourniquet time-  Total Tourniquet Time Documented: Thigh (Left) - 33 minutes Total: Thigh (Left) - 33 minutes     Complications- None  Condition-PACU - hemodynamically stable.   Brief Clinical Note  Monica [name] is a 72 y.o. year old female with end stage OA of her left knee with progressively worsening pain and dysfunction. She has constant pain, with activity and at rest and significant functional deficits with difficulties even with ADLs. She has had extensive non-op management including analgesics, injections of cortisone and viscosupplements, and home exercise program, but remains in significant pain with significant dysfunction. Radiographs show bone on bone arthritis all 3 compartments with varus deformity. She presents now for left Total Knee Arthroplasty.    Procedure in detail---   The patient is brought into the operating room and positioned supine on the operating table. After successful administration of  Adductor canal block and spinal,   a tourniquet is placed high on the  Left thigh(s) and the lower extremity is prepped and draped in the usual sterile fashion. Time out is performed by the operating team and then the  Left lower extremity is wrapped in Esmarch, knee flexed and the tourniquet inflated to 300 mmHg.       A midline incision is made with a ten blade through the subcutaneous tissue to the level of the extensor mechanism. A fresh blade is used to make a medial parapatellar arthrotomy. Soft tissue over the proximal medial tibia is subperiosteally elevated to the joint line with a knife and into the semimembranosus bursa  with a Cobb elevator. Soft tissue over the proximal lateral tibia is elevated with attention being paid to avoiding the patellar tendon on the tibial tubercle. The patella is everted, knee flexed 90 degrees and the ACL and PCL are removed. Findings are bone n bone all 3 compartments with massive global osteophytes.        The drill is used to create a starting hole in the distal femur and the canal is thoroughly irrigated with sterile saline to remove the fatty contents. The 5 degree Left  valgus alignment guide is placed into the femoral canal and the distal femoral cutting block is pinned to remove 9 mm off the distal femur. Resection is made with an oscillating saw.      The tibia is subluxed forward and the menisci are removed. The extramedullary alignment guide is placed referencing proximally at the medial aspect of the tibial tubercle and distally along the second metatarsal axis and tibial crest. The block is pinned to remove 47mm off the more deficient medial  side. Resection is made with an oscillating saw. Size 5is the most appropriate size for the tibia and the proximal tibia is prepared with the modular drill and keel punch for that size.      The femoral sizing guide is placed and size 6 is most appropriate. Rotation is marked off the epicondylar axis and confirmed by creating a rectangular flexion gap at 90 degrees. The size 6 cutting block is pinned in this rotation and the anterior, posterior and chamfer cuts are made with the oscillating saw. The intercondylar block is then placed and that cut  is made.      Trial size 5 tibial component, trial size 6 narrow posterior stabilized femur and a 10  mm posterior stabilized rotating platform insert trial is placed. Full extension is achieved with excellent varus/valgus and anterior/posterior balance throughout full range of motion. The patella is everted and thickness measured to be 22  mm. Free hand resection is taken to 12 mm, a 38 template is  placed, lug holes are drilled, trial patella is placed, and it tracks normally. Osteophytes are removed off the posterior femur with the trial in place. All trials are removed and the cut bone surfaces prepared with pulsatile lavage. Cement is mixed and once ready for implantation, the size 5 tibial implant, size  6 narrow posterior stabilized femoral component, and the size 38 patella are cemented in place and the patella is held with the clamp. The trial insert is placed and the knee held in full extension. The Exparel (20 ml mixed with 60 ml saline) is injected into the extensor mechanism, posterior capsule, medial and lateral gutters and subcutaneous tissues.  All extruded cement is removed and once the cement is hard the permanent 10 mm posterior stabilized rotating platform insert is placed into the tibial tray.      The wound is copiously irrigated with saline solution and the extensor mechanism closed over a hemovac drain with #1 V-loc suture. The tourniquet is released for a total tourniquet time of 33  minutes. Flexion against gravity is 140 degrees and the patella tracks normally. Subcutaneous tissue is closed with 2.0 vicryl and subcuticular with running 4.0 Monocryl. The incision is cleaned and dried and steri-strips and a bulky sterile dressing are applied. The limb is placed into a knee immobilizer and the patient is awakened and transported to recovery in stable condition.      Please note that a surgical assistant was a medical necessity for this procedure in order to perform it in a safe and expeditious manner. Surgical assistant was necessary to retract the ligaments and vital neurovascular structures to prevent injury to them and also necessary for proper positioning of the limb to allow for anatomic placement of the prosthesis.   Dione Plover Arlicia Paquette, MD    09/20/2017, 8:07 AM

## 2017-09-20 NOTE — Interval H&P Note (Signed)
History and Physical Interval Note:  09/20/2017 6:32 AM  Monica [name] Plan  has presented today for surgery, with the diagnosis of Osteoarthritis Left Knee  The various methods of treatment have been discussed with the patient and family. After consideration of risks, benefits and other options for treatment, the patient has consented to  Procedure(s): LEFT TOTAL KNEE ARTHROPLASTY (Left) as a surgical intervention .  The patient's history has been reviewed, patient examined, no change in status, stable for surgery.  I have reviewed the patient's chart and labs.  Questions were answered to the patient's satisfaction.     Pilar Plate Krimson Massmann

## 2017-09-20 NOTE — Anesthesia Procedure Notes (Signed)
Date/Time: 09/20/2017 7:14 AM Performed by: Talbot Grumbling, CRNA Oxygen Delivery Method: Simple face mask

## 2017-09-20 NOTE — Transfer of Care (Signed)
Immediate Anesthesia Transfer of Care Note  Patient: Monica [name]  Procedure(s) Performed: LEFT TOTAL KNEE ARTHROPLASTY (Left Knee)  Patient Location: PACU  Anesthesia Type:Spinal  Level of Consciousness: awake, alert  and oriented  Airway & Oxygen Therapy: Patient Spontanous Breathing and Patient connected to face mask oxygen  Post-op Assessment: Report given to RN and Post -op Vital signs reviewed and stable  Post vital signs: Reviewed and stable  Last Vitals:  Vitals Value Taken Time  BP    Temp    Pulse 72 09/20/2017  8:27 AM  Resp 25 09/20/2017  8:27 AM  SpO2 100 % 09/20/2017  8:27 AM  Vitals shown include unvalidated device data.  Last Pain:  Vitals:   09/20/17 0601  PainSc: 7       Patients Stated Pain Goal: 3 (32/67/12 4580)  Complications: No apparent anesthesia complications

## 2017-09-20 NOTE — Progress Notes (Signed)
Discharge planning, spoke with patient at beside. Chose [facility] for [facility] services, PT to eval and treat. Contacted [facility] for referral. Needs a RW contacted [facility] to deliver to her room. [phone]

## 2017-09-20 NOTE — Anesthesia Procedure Notes (Signed)
Anesthesia Regional Block: Adductor canal block   Pre-Anesthetic Checklist: ,, timeout performed, Correct Patient, Correct Site, Correct Laterality, Correct Procedure, Correct Position, site marked, Risks and benefits discussed,  Surgical consent,  Pre-op evaluation,  At surgeon's request and post-op pain management  Laterality: Left  Prep: chloraprep       Needles:  Injection technique: Single-shot  Needle Type: Echogenic Stimulator Needle     Needle Length: 9cm  Needle Gauge: 21     Additional Needles:   Procedures:,,,, ultrasound used (permanent image in chart),,,,  Narrative:  Start time: 09/20/2017 6:49 AM End time: 09/20/2017 6:57 AM Injection made incrementally with aspirations every 5 mL.  Performed by: Personally  Anesthesiologist: Barnet Glasgow, MD

## 2017-09-20 NOTE — Anesthesia Procedure Notes (Signed)
Spinal  Patient location during procedure: OB Start time: 09/20/2017 7:06 AM End time: 09/20/2017 7:14 AM Staffing Anesthesiologist: Barnet Glasgow, MD Performed: anesthesiologist  Preanesthetic Checklist Completed: patient identified, surgical consent, pre-op evaluation, timeout performed, IV checked, risks and benefits discussed and monitors and equipment checked Spinal Block Patient position: sitting Prep: site prepped and draped and DuraPrep Patient monitoring: heart rate, cardiac monitor, continuous pulse ox and blood pressure Approach: midline Location: L3-4 Injection technique: single-shot Needle Needle type: Pencan  Needle gauge: 24 G Needle length: 10 cm Assessment Sensory level: T4

## 2017-09-20 NOTE — Plan of Care (Signed)
Plan of care 

## 2017-09-20 NOTE — Evaluation (Signed)
Physical Therapy Evaluation Patient Details Name: Monica [name] MRN: [id number] DOB: 1945-12-05 Today's Date: 09/20/2017   History of Present Illness  L TKA, DJD on right knee  Clinical Impression  Patient tolerated ambulating x 60' today. Plans Dc home with HHPT. Pt admitted with above diagnosis. Pt currently with functional limitations due to the deficits listed below (see PT Problem List).  Pt will benefit from skilled PT to increase their independence and safety with mobility to allow discharge to the venue listed below.       Follow Up Recommendations Home health PT;Follow surgeon's recommendation for DC plan and follow-up therapies    Equipment Recommendations  Rolling walker with 5" wheels(already delivered)    Recommendations for Other Services       Precautions / Restrictions Precautions Precautions: Knee Required Braces or Orthoses: Knee Immobilizer - Left Knee Immobilizer - Left: Discontinue once straight leg raise with < 10 degree lag      Mobility  Bed Mobility Overal bed mobility: Needs Assistance Bed Mobility: Supine to Sit     Supine to sit: Min guard     General bed mobility comments: patient able to mobilize to bed edge.  Transfers Overall transfer level: Needs assistance Equipment used: Rolling walker (2 wheeled) Transfers: Sit to/from Stand Sit to Stand: Min assist         General transfer comment: cues for hand placement, left leg position  Ambulation/Gait Ambulation/Gait assistance: Min assist Ambulation Distance (Feet): 60 Feet Assistive device: Rolling walker (2 wheeled) Gait Pattern/deviations: Step-to pattern;Step-through pattern     General Gait Details: cues for sequence  Stairs            Wheelchair Mobility    Modified Rankin (Stroke Patients Only)       Balance                                             Pertinent Vitals/Pain Pain Assessment: 0-10 Pain Score: 4  Pain Location: left  knee Pain Descriptors / Indicators: Discomfort;Operative site guarding;Sore Pain Intervention(s): Monitored during session;Premedicated before session;Repositioned;Ice applied    Home Living Family/patient expects to be discharged to:: Private residence Living Arrangements: Spouse/significant other;Other relatives Available Help at Discharge: Family Type of Home: House Home Access: Stairs to enter Entrance Stairs-Rails: Left Entrance Stairs-Number of Steps: 5 Home Layout: Two level;1/2 bath on main level Home Equipment: Walker - 2 wheels;Bedside commode      Prior Function Level of Independence: Independent               Hand Dominance        Extremity/Trunk Assessment   Upper Extremity Assessment Upper Extremity Assessment: Defer to OT evaluation    Lower Extremity Assessment Lower Extremity Assessment: LLE deficits/detail LLE Deficits / Details: able to perform SLR    Cervical / Trunk Assessment Cervical / Trunk Assessment: Normal  Communication   Communication: No difficulties  Cognition Arousal/Alertness: Awake/alert Behavior During Therapy: WFL for tasks assessed/performed Overall Cognitive Status: Within Functional Limits for tasks assessed                                        General Comments      Exercises Total Joint Exercises Ankle Circles/Pumps: AROM;Both;5 reps Quad Sets: AROM;Both;5 reps   Assessment/Plan  PT Assessment Patient needs continued PT services  PT Problem List Decreased strength;Decreased range of motion;Decreased activity tolerance;Decreased safety awareness;Decreased knowledge of precautions;Decreased mobility;Pain       PT Treatment Interventions DME instruction;Therapeutic exercise;Gait training;Stair training;Functional mobility training;Therapeutic activities;Patient/family education    PT Goals (Current goals can be found in the Care Plan section)  Acute Rehab PT Goals Patient Stated Goal: to go  home PT Goal Formulation: With patient/family Time For Goal Achievement: 09/25/17 Potential to Achieve Goals: Good    Frequency 7X/week   Barriers to discharge        Co-evaluation               AM-PAC PT "6 Clicks" Daily Activity  Outcome Measure Difficulty turning over in bed (including adjusting bedclothes, sheets and blankets)?: A Little Difficulty moving from lying on back to sitting on the side of the bed? : A Little Difficulty sitting down on and standing up from a chair with arms (e.g., wheelchair, bedside commode, etc,.)?: A Little Help needed moving to and from a bed to chair (including a wheelchair)?: A Lot Help needed walking in hospital room?: A Lot Help needed climbing 3-5 steps with a railing? : Total 6 Click Score: 14    End of Session Equipment Utilized During Treatment: Left knee immobilizer Activity Tolerance: Patient tolerated treatment well Patient left: in chair;with call bell/phone within reach;with family/visitor present Nurse Communication: Mobility status PT Visit Diagnosis: Unsteadiness on feet (R26.81);Pain Pain - Right/Left: Left Pain - part of body: Knee    Time: 7672-0947 PT Time Calculation (min) (ACUTE ONLY): 28 min   Charges:   PT Evaluation $PT Eval Low Complexity: 1 Low PT Treatments $Gait Training: 8-22 mins   PT G CodesTresa Endo PT [phone]\ [name] 09/20/2017, 5:52 PM

## 2017-09-21 ENCOUNTER — Encounter (HOSPITAL_COMMUNITY): Payer: Self-pay | Admitting: Orthopedic Surgery

## 2017-09-21 LAB — CBC
HEMATOCRIT: 33.2 % — AB (ref 36.0–46.0)
HEMOGLOBIN: 10.7 g/dL — AB (ref 12.0–15.0)
MCH: 28.9 pg (ref 26.0–34.0)
MCHC: 32.2 g/dL (ref 30.0–36.0)
MCV: 89.7 fL (ref 78.0–100.0)
Platelets: 203 10*3/uL (ref 150–400)
RBC: 3.7 MIL/uL — AB (ref 3.87–5.11)
RDW: 14.5 % (ref 11.5–15.5)
WBC: 7.4 10*3/uL (ref 4.0–10.5)

## 2017-09-21 LAB — BASIC METABOLIC PANEL
ANION GAP: 3 — AB (ref 5–15)
BUN: 16 mg/dL (ref 6–20)
CHLORIDE: 110 mmol/L (ref 101–111)
CO2: 30 mmol/L (ref 22–32)
Calcium: 9.1 mg/dL (ref 8.9–10.3)
Creatinine, Ser: 0.83 mg/dL (ref 0.44–1.00)
GFR calc non Af Amer: >60 mL/min (ref >60)
Glucose, Bld: 139 mg/dL — ABNORMAL HIGH (ref 65–99)
POTASSIUM: 4.6 mmol/L (ref 3.5–5.1)
Sodium: 143 mmol/L (ref 135–145)

## 2017-09-21 MED ORDER — RIVAROXABAN 10 MG PO TABS: 10.0000 mg | ORAL_TABLET | Freq: Every day | ORAL | 0 refills | Status: DC

## 2017-09-21 MED ORDER — TRAMADOL HCL 50 MG PO TABS: 50.0000 mg | ORAL_TABLET | Freq: Four times a day (QID) | ORAL | 0 refills | Status: DC | PRN

## 2017-09-21 MED ORDER — LIP MEDEX EX OINT
TOPICAL_OINTMENT | CUTANEOUS | Status: AC
  Administered 2017-09-21: 06:00:00
  Filled 2017-09-21: qty 7

## 2017-09-21 MED ORDER — HYDROMORPHONE HCL 2 MG PO TABS: 2.0000 mg | ORAL_TABLET | ORAL | 0 refills | Status: DC | PRN

## 2017-09-21 MED ORDER — METHOCARBAMOL 500 MG PO TABS: 500.0000 mg | ORAL_TABLET | Freq: Four times a day (QID) | ORAL | 0 refills | Status: DC | PRN

## 2017-09-21 NOTE — Progress Notes (Signed)
Physical Therapy Treatment Patient Details Name: Monica [name] MRN: [id number] DOB: 17-Jun-1946 Today's Date: 09/21/2017    History of Present Illness L TKA, DJD on right knee    PT Comments    Progressing well, reports increased pain today. Continue PT   Follow Up Recommendations  Home health PT;Follow surgeon's recommendation for DC plan and follow-up therapies     Equipment Recommendations  Rolling walker with 5" wheels    Recommendations for Other Services       Precautions / Restrictions Precautions Precautions: Knee Required Braces or Orthoses: Knee Immobilizer - Left Knee Immobilizer - Left: Discontinue once straight leg raise with < 10 degree lag    Mobility  Bed Mobility Overal bed mobility: Needs Assistance Bed Mobility: Supine to Sit     Supine to sit: Min guard     General bed mobility comments: patient able to mobilize to bed edge.  Transfers Overall transfer level: Needs assistance Equipment used: Rolling walker (2 wheeled) Transfers: Sit to/from Stand Sit to Stand: Min assist         General transfer comment: cues for hand placement, left leg position  Ambulation/Gait Ambulation/Gait assistance: Min assist Ambulation Distance (Feet): 60 Feet Assistive device: Rolling walker (2 wheeled) Gait Pattern/deviations: Step-to pattern;Step-through pattern     General Gait Details: cues for sequence   Stairs            Wheelchair Mobility    Modified Rankin (Stroke Patients Only)       Balance                                            Cognition Arousal/Alertness: Awake/alert                                            Exercises Total Joint Exercises Ankle Circles/Pumps: AROM;10 reps;Both Quad Sets: AROM;10 reps;Both Heel Slides: AAROM;Left;10 reps Straight Leg Raises: AAROM;Left;10 reps    General Comments        Pertinent Vitals/Pain Pain Score: 8  Pain Location: left knee Pain  Descriptors / Indicators: Discomfort;Operative site guarding;Sore Pain Intervention(s): Premedicated before session;Monitored during session    Home Living                      Prior Function            PT Goals (current goals can now be found in the care plan section) Progress towards PT goals: Progressing toward goals    Frequency    7X/week      PT Plan Current plan remains appropriate    Co-evaluation              AM-PAC PT "6 Clicks" Daily Activity  Outcome Measure  Difficulty turning over in bed (including adjusting bedclothes, sheets and blankets)?: A Little Difficulty moving from lying on back to sitting on the side of the bed? : A Little Difficulty sitting down on and standing up from a chair with arms (e.g., wheelchair, bedside commode, etc,.)?: A Little Help needed moving to and from a bed to chair (including a wheelchair)?: A Lot Help needed walking in hospital room?: A Lot Help needed climbing 3-5 steps with a railing? : Total 6 Click Score: 14  End of Session Equipment Utilized During Treatment: Left knee immobilizer Activity Tolerance: Patient tolerated treatment well Patient left: in chair;with call bell/phone within reach;with family/visitor present Nurse Communication: Mobility status PT Visit Diagnosis: Unsteadiness on feet (R26.81);Pain Pain - Right/Left: Left Pain - part of body: Knee     Time: 1128-1209 PT Time Calculation (min) (ACUTE ONLY): 41 min  Charges:  $Gait Training: 8-22 mins $Therapeutic Exercise: 8-22 mins $Self Care/Home Management: 8-22                    G CodesTresa Endo PT [phone]    [name] 09/21/2017, 5:01 PM

## 2017-09-21 NOTE — Discharge Instructions (Signed)
° °Dr. Frank Aluisio °Total Joint Specialist °[facility] °[address] °[place], [place] [address] °[phone] ° °TOTAL KNEE REPLACEMENT POSTOPERATIVE DIRECTIONS ° °Knee Rehabilitation, Guidelines Following Surgery  °Results after knee surgery are often greatly improved when you follow the exercise, range of motion and muscle strengthening exercises prescribed by your doctor. Safety measures are also important to protect the knee from further injury. Any time any of these exercises cause you to have increased pain or swelling in your knee joint, decrease the amount until you are comfortable again and slowly increase them. If you have problems or questions, call your caregiver or physical therapist for advice.  ° °HOME CARE INSTRUCTIONS  °Remove items at home which could result in a fall. This includes throw rugs or furniture in walking pathways.  °· ICE to the affected knee every three hours for 30 minutes at a time and then as needed for pain and swelling.  Continue to use ice on the knee for pain and swelling from surgery. You may notice swelling that will progress down to the foot and ankle.  This is normal after surgery.  Elevate the leg when you are not up walking on it.   °· Continue to use the breathing machine which will help keep your temperature down.  It is common for your temperature to cycle up and down following surgery, especially at night when you are not up moving around and exerting yourself.  The breathing machine keeps your lungs expanded and your temperature down. °· Do not place pillow under knee, focus on keeping the knee straight while resting ° °DIET °You may resume your previous home diet once your are discharged from the hospital. ° °DRESSING / WOUND CARE / SHOWERING °You may shower 3 days after surgery, but keep the wounds dry during showering.  You may use an occlusive plastic wrap (Press'n Seal for example), NO SOAKING/SUBMERGING IN THE BATHTUB.  If the bandage gets  wet, change with a clean dry gauze.  If the incision gets wet, pat the wound dry with a clean towel. °You may start showering once you are discharged home but do not submerge the incision under water. Just pat the incision dry and apply a dry gauze dressing on daily. °Change the surgical dressing daily and reapply a dry dressing each time. ° °ACTIVITY °Walk with your walker as instructed. °Use walker as long as suggested by your caregivers. °Avoid periods of inactivity such as sitting longer than an hour when not asleep. This helps prevent blood clots.  °You may resume a sexual relationship in one month or when given the OK by your doctor.  °You may return to work once you are cleared by your doctor.  °Do not drive a car for 6 weeks or until released by you surgeon.  °Do not drive while taking narcotics. ° °WEIGHT BEARING °Weight bearing as tolerated with assist device (walker, cane, etc) as directed, use it as long as suggested by your surgeon or therapist, typically at least 4-6 weeks. ° °POSTOPERATIVE CONSTIPATION PROTOCOL °Constipation - defined medically as fewer than three stools per week and severe constipation as less than one stool per week. ° °One of the most common issues patients have following surgery is constipation.  Even if you have a regular bowel pattern at home, your normal regimen is likely to be disrupted due to multiple reasons following surgery.  Combination of anesthesia, postoperative narcotics, change in appetite and fluid intake all can affect your bowels.    In order to avoid complications following surgery, here are some recommendations in order to help you during your recovery period. ° °Colace (docusate) - Pick up an over-the-counter form of Colace or another stool softener and take twice a day as long as you are requiring postoperative pain medications.  Take with a full glass of water daily.  If you experience loose stools or diarrhea, hold the colace until you stool forms back up.  If  your symptoms do not get better within 1 week or if they get worse, check with your doctor. ° °Dulcolax (bisacodyl) - Pick up over-the-counter and take as directed by the product packaging as needed to assist with the movement of your bowels.  Take with a full glass of water.  Use this product as needed if not relieved by Colace only.  ° °MiraLax (polyethylene glycol) - Pick up over-the-counter to have on hand.  MiraLax is a solution that will increase the amount of water in your bowels to assist with bowel movements.  Take as directed and can mix with a glass of water, juice, soda, coffee, or tea.  Take if you go more than two days without a movement. °Do not use MiraLax more than once per day. Call your doctor if you are still constipated or irregular after using this medication for 7 days in a row. ° °If you continue to have problems with postoperative constipation, please contact the office for further assistance and recommendations.  If you experience "the worst abdominal pain ever" or develop nausea or vomiting, please contact the office immediatly for further recommendations for treatment. ° °ITCHING ° If you experience itching with your medications, try taking only a single pain pill, or even half a pain pill at a time.  You can also use Benadryl over the counter for itching or also to help with sleep.  ° °TED HOSE STOCKINGS °Wear the elastic stockings on both legs for three weeks following surgery during the day but you may remove then at night for sleeping. ° °MEDICATIONS °See your medication summary on the “After Visit Summary” that the nursing staff will review with you prior to discharge.  You may have some home medications which will be placed on hold until you complete the course of blood thinner medication.  It is important for you to complete the blood thinner medication as prescribed by your surgeon.  Continue your approved medications as instructed at time of discharge. ° °PRECAUTIONS °If you  experience chest pain or shortness of breath - call 911 immediately for transfer to the hospital emergency department.  °If you develop a fever greater that 101 F, purulent drainage from wound, increased redness or drainage from wound, foul odor from the wound/dressing, or calf pain - CONTACT YOUR SURGEON.   °                                                °FOLLOW-UP APPOINTMENTS °Make sure you keep all of your appointments after your operation with your surgeon and caregivers. You should call the office at the above phone number and make an appointment for approximately two weeks after the date of your surgery or on the date instructed by your surgeon outlined in the "After Visit Summary". ° ° °RANGE OF MOTION AND STRENGTHENING EXERCISES  °Rehabilitation of the knee is important following a knee injury or   an operation. After just a few days of immobilization, the muscles of the thigh which control the knee become weakened and shrink (atrophy). Knee exercises are designed to build up the tone and strength of the thigh muscles and to improve knee motion. Often times heat used for twenty to thirty minutes before working out will loosen up your tissues and help with improving the range of motion but do not use heat for the first two weeks following surgery. These exercises can be done on a training (exercise) mat, on the floor, on a table or on a bed. Use what ever works the best and is most comfortable for you Knee exercises include:  °Leg Lifts - While your knee is still immobilized in a splint or cast, you can do straight leg raises. Lift the leg to 60 degrees, hold for 3 sec, and slowly lower the leg. Repeat 10-20 times 2-3 times daily. Perform this exercise against resistance later as your knee gets better.  °Quad and Hamstring Sets - Tighten up the muscle on the front of the thigh (Quad) and hold for 5-10 sec. Repeat this 10-20 times hourly. Hamstring sets are done by pushing the foot backward against an object  and holding for 5-10 sec. Repeat as with quad sets.  °· Leg Slides: Lying on your back, slowly slide your foot toward your buttocks, bending your knee up off the floor (only go as far as is comfortable). Then slowly slide your foot back down until your leg is flat on the floor again. °· Angel Wings: Lying on your back spread your legs to the side as far apart as you can without causing discomfort.  °A rehabilitation program following serious knee injuries can speed recovery and prevent re-injury in the future due to weakened muscles. Contact your doctor or a physical therapist for more information on knee rehabilitation.  ° °IF YOU ARE TRANSFERRED TO A SKILLED REHAB FACILITY °If the patient is transferred to a skilled rehab facility following release from the hospital, a list of the current medications will be sent to the facility for the patient to continue.  When discharged from the skilled rehab facility, please have the facility set up the patient's Home Health Physical Therapy prior to being released. Also, the skilled facility will be responsible for providing the patient with their medications at time of release from the facility to include their pain medication, the muscle relaxants, and their blood thinner medication. If the patient is still at the rehab facility at time of the two week follow up appointment, the skilled rehab facility will also need to assist the patient in arranging follow up appointment in our office and any transportation needs. ° °MAKE SURE YOU:  °Understand these instructions.  °Get help right away if you are not doing well or get worse.  ° ° °Pick up stool softner and laxative for home use following surgery while on pain medications. °Do not submerge incision under water. °Please use good hand washing techniques while changing dressing each day. °May shower starting three days after surgery. °Please use a clean towel to pat the incision dry following showers. °Continue to use ice for  pain and swelling after surgery. °Do not use any lotions or creams on the incision until instructed by your surgeon. ° °Take Xarelto for two and a half more weeks following discharge from the hospital, then discontinue Xarelto. °Once the patient has completed the blood thinner regimen, then take a Baby 81 mg Aspirin daily for three   more weeks. ° ° ° °Information on my medicine - XARELTO® (Rivaroxaban) ° °Why was Xarelto® prescribed for you? °Xarelto® was prescribed for you to reduce the risk of blood clots forming after orthopedic surgery. The medical term for these abnormal blood clots is venous thromboembolism (VTE). ° °What do you need to know about xarelto® ? °Take your Xarelto® ONCE DAILY at the same time every day. °You may take it either with or without food. ° °If you have difficulty swallowing the tablet whole, you may crush it and mix in applesauce just prior to taking your dose. ° °Take Xarelto® exactly as prescribed by your doctor and DO NOT stop taking Xarelto® without talking to the doctor who prescribed the medication.  Stopping without other VTE prevention medication to take the place of Xarelto® may increase your risk of developing a clot. ° °After discharge, you should have regular check-up appointments with your healthcare provider that is prescribing your Xarelto®.   ° °What do you do if you miss a dose? °If you miss a dose, take it as soon as you remember on the same day then continue your regularly scheduled once daily regimen the next day. Do not take two doses of Xarelto® on the same day.  ° °Important Safety Information °A possible side effect of Xarelto® is bleeding. You should call your healthcare provider right away if you experience any of the following: °? Bleeding from an injury or your nose that does not stop. °? Unusual colored urine (red or dark brown) or unusual colored stools (red or black). °? Unusual bruising for unknown reasons. °? A serious fall or if you hit your head (even  if there is no bleeding). ° °Some medicines may interact with Xarelto® and might increase your risk of bleeding while on Xarelto®. To help avoid this, consult your healthcare provider or pharmacist prior to using any new prescription or non-prescription medications, including herbals, vitamins, non-steroidal anti-inflammatory drugs (NSAIDs) and supplements. ° °This website has more information on Xarelto®: www.xarelto.com. ° ° ° °

## 2017-09-21 NOTE — Progress Notes (Signed)
Physical Therapy Treatment Patient Details Name: Monica [name] MRN: [id number] DOB: [birth year] Today's Date: 09/21/2017    History of Present Illness L TKA, DJD on right knee    PT Comments    The patient is complaining of nausea this visit. Assisted to [facility] and back to bed. Continue PT.   Follow Up Recommendations  Home health PT;Follow surgeon's recommendation for DC plan and follow-up therapies     Equipment Recommendations  Rolling walker with 5" wheels    Recommendations for Other Services       Precautions / Restrictions Precautions Precautions: Knee Required Braces or Orthoses: Knee Immobilizer - Left Knee Immobilizer - Left: Discontinue once straight leg raise with < 10 degree lag    Mobility  Bed Mobility Overal bed mobility: Needs Assistance Bed Mobility: Sit to Supine     Supine to sit: Min guard Sit to supine: Min assist   General bed mobility comments: in recliner, patient self assisted the left leg  Transfers Overall transfer level: Needs assistance Equipment used: Rolling walker (2 wheeled) Transfers: Sit to/from [facility] Sit to Stand: Min assist Stand pivot transfers: Min assist       General transfer comment: cues for hand placement, left leg position, assisted to [facility] then to bed.  Ambulation/Gait Ambulation/Gait assistance: Min assist Ambulation Distance (Feet): 60 Feet Assistive device: Rolling walker (2 wheeled) Gait Pattern/deviations: Step-to pattern;Step-through pattern     General Gait Details: cues for sequence   Stairs            Wheelchair Mobility    Modified Rankin (Stroke Patients Only)       Balance                                            Cognition Arousal/Alertness: Awake/alert                                            Exercises Total Joint Exercises Ankle Circles/Pumps: AROM;10 reps;Both Quad Sets: AROM;10 reps;Both Heel Slides:  AAROM;Left;10 reps Straight Leg Raises: AAROM;Left;10 reps    General Comments        Pertinent Vitals/Pain Pain Score: 8  Pain Location: left knee Pain Descriptors / Indicators: Discomfort;Operative site guarding;Sore Pain Intervention(s): Premedicated before session;Monitored during session    Home Living                      Prior Function            PT Goals (current goals can now be found in the care plan section) Progress towards PT goals: Progressing toward goals    Frequency    7X/week      PT Plan Current plan remains appropriate    Co-evaluation              AM-PAC PT "6 Clicks" Daily Activity  Outcome Measure  Difficulty turning over in bed (including adjusting bedclothes, sheets and blankets)?: A Little Difficulty moving from lying on back to sitting on the side of the bed? : A Little Difficulty sitting down on and standing up from a chair with arms (e.g., wheelchair, bedside commode, etc,.)?: A Little Help needed moving to and from a bed to chair (including a wheelchair)?: A Lot Help  needed walking in hospital room?: A Little Help needed climbing 3-5 steps with a railing? : Total 6 Click Score: 15    End of Session Equipment Utilized During Treatment: Left knee immobilizer Activity Tolerance: Patient tolerated treatment well Patient left: in chair;with call bell/phone within reach;with family/visitor present Nurse Communication: Mobility status PT Visit Diagnosis: Unsteadiness on feet (R26.81);Pain Pain - Right/Left: Left Pain - part of body: Knee     Time: 7619-5093 PT Time Calculation (min) (ACUTE ONLY): 31 min  Charges:    $Therapeutic Activity: 23-37 mins                   G CodesTresa Endo PT [phone]    [name] 09/21/2017, 5:05 PM

## 2017-09-21 NOTE — Discharge Summary (Addendum)
Physician Discharge Summary   Patient ID: Monica [name] MRN: [id number] DOB/AGE: 70-Mar-1947 70 y.o.  Admit date: 09/20/2017 Discharge date: 09-22-2017  Primary Diagnosis:  Osteoarthritis Left knee(s)   Admission Diagnoses:  Past Medical History:  Diagnosis Date  . Allergy codeine   rapid heart beat, cold sweat  . Arthritis   . Breast cancer ([place]) 01/10/14   Left breast INVASIVE DUCTAL CARCINOMA STAGE 2  . Breast cancer ([place]) 2018   Right breast   . Hypertension   . Hyperthyroidism    medication called in today for hyperthroid  . Personal history of chemotherapy 2015   Left Breast Cancer  . Personal history of radiation therapy 2015   Left Breast Cancer  . Personal history of radiation therapy 2018   Right breast cancer   . PONV (postoperative nausea and vomiting)   . S/P radiation therapy 09/21/14 completed   left breast   Discharge Diagnoses:   Principal Problem:   OA (osteoarthritis) of knee  Estimated body mass index is 35.83 kg/m as calculated from the following:   Height as of this encounter: 5' 6" (1.676 m).   Weight as of this encounter: 100.7 kg (222 lb).  Procedure:  Procedure(s) (LRB): LEFT TOTAL KNEE ARTHROPLASTY (Left)   Consults: None  HPI: Monica [name] is a 72 y.o. year old female with end stage OA of her left knee with progressively worsening pain and dysfunction. She has constant pain, with activity and at rest and significant functional deficits with difficulties even with ADLs. She has had extensive non-op management including analgesics, injections of cortisone and viscosupplements, and home exercise program, but remains in significant pain with significant dysfunction. Radiographs show bone on bone arthritis all 3 compartments with varus deformity. She presents now for left Total Knee Arthroplasty.    Laboratory Data: Admission on 09/20/2017, Discharged on 09/22/2017  Component Date Value Ref Range Status  . Glucose-Capillary 09/20/2017 87   65 - 99 mg/dL Final  . WBC 09/21/2017 7.4  4.0 - 10.5 K/uL Final  . RBC 09/21/2017 3.70* 3.87 - 5.11 MIL/uL Final  . Hemoglobin 09/21/2017 10.7* 12.0 - 15.0 g/dL Final  . HCT 09/21/2017 33.2* 36.0 - 46.0 % Final  . MCV 09/21/2017 89.7  78.0 - 100.0 fL Final  . MCH 09/21/2017 28.9  26.0 - 34.0 pg Final  . MCHC 09/21/2017 32.2  30.0 - 36.0 g/dL Final  . RDW 09/21/2017 14.5  11.5 - 15.5 % Final  . Platelets 09/21/2017 203  150 - 400 K/uL Final   Performed at [facility], [place] [address], [place], [place] [address]  . Sodium 09/21/2017 143  135 - 145 mmol/L Final  . Potassium 09/21/2017 4.6  3.5 - 5.1 mmol/L Final  . Chloride 09/21/2017 110  101 - 111 mmol/L Final  . CO2 09/21/2017 30  22 - 32 mmol/L Final  . Glucose, Bld 09/21/2017 139* 65 - 99 mg/dL Final  . BUN 09/21/2017 16  6 - 20 mg/dL Final  . Creatinine, Ser 09/21/2017 0.83  0.44 - 1.00 mg/dL Final  . Calcium 09/21/2017 9.1  8.9 - 10.3 mg/dL Final  . GFR calc non Af Amer 09/21/2017 >60  >60 mL/min Final  . GFR calc Af Amer 09/21/2017 >60  >60 mL/min Final   Comment: (NOTE) The eGFR has been calculated using the CKD EPI equation. This calculation has not been validated in all clinical situations. eGFR's persistently <60 mL/min signify possible Chronic Kidney Disease.   Georgiann Hahn  gap 09/21/2017 3* 5 - 15 Final   Performed at [facility], [place] [address], [place], [place] [address]  . WBC 09/22/2017 8.6  4.0 - 10.5 K/uL Final  . RBC 09/22/2017 3.80* 3.87 - 5.11 MIL/uL Final  . Hemoglobin 09/22/2017 11.1* 12.0 - 15.0 g/dL Final  . HCT 09/22/2017 34.0* 36.0 - 46.0 % Final  . MCV 09/22/2017 89.5  78.0 - 100.0 fL Final  . MCH 09/22/2017 29.2  26.0 - 34.0 pg Final  . MCHC 09/22/2017 32.6  30.0 - 36.0 g/dL Final  . RDW 09/22/2017 14.7  11.5 - 15.5 % Final  . Platelets 09/22/2017 227  150 - 400 K/uL Final   Performed at [facility], [place] [address], [place], [place] [address]  .  Sodium 09/22/2017 137  135 - 145 mmol/L Final  . Potassium 09/22/2017 3.9  3.5 - 5.1 mmol/L Final  . Chloride 09/22/2017 101  101 - 111 mmol/L Final  . CO2 09/22/2017 29  22 - 32 mmol/L Final  . Glucose, Bld 09/22/2017 126* 65 - 99 mg/dL Final  . BUN 09/22/2017 14  6 - 20 mg/dL Final  . Creatinine, Ser 09/22/2017 0.74  0.44 - 1.00 mg/dL Final  . Calcium 09/22/2017 9.1  8.9 - 10.3 mg/dL Final  . GFR calc non Af Amer 09/22/2017 >60  >60 mL/min Final  . GFR calc Af Amer 09/22/2017 >60  >60 mL/min Final   Comment: (NOTE) The eGFR has been calculated using the CKD EPI equation. This calculation has not been validated in all clinical situations. eGFR's persistently <60 mL/min signify possible Chronic Kidney Disease.   Georgiann Hahn gap 09/22/2017 7  5 - 15 Final   Performed at [facility], [place] [address], [place], [place] [address]  Hospital Outpatient Visit on 09/14/2017  Component Date Value Ref Range Status  . aPTT 09/14/2017 27  24 - 36 seconds Final   Performed at [facility], [place] [address] [place], [place] [address]  . WBC 09/14/2017 3.1* 4.0 - 10.5 K/uL Final  . RBC 09/14/2017 4.26  3.87 - 5.11 MIL/uL Final  . Hemoglobin 09/14/2017 12.4  12.0 - 15.0 g/dL Final  . HCT 09/14/2017 38.9  36.0 - 46.0 % Final  . MCV 09/14/2017 91.3  78.0 - 100.0 fL Final  . MCH 09/14/2017 29.1  26.0 - 34.0 pg Final  . MCHC 09/14/2017 31.9  30.0 - 36.0 g/dL Final  . RDW 09/14/2017 14.8  11.5 - 15.5 % Final  . Platelets 09/14/2017 261  150 - 400 K/uL Final   Performed at [facility], [place] [address], [place], [place] [address]  . Sodium 09/14/2017 139  135 - 145 mmol/L Final  . Potassium 09/14/2017 4.2  3.5 - 5.1 mmol/L Final  . Chloride 09/14/2017 104  101 - 111 mmol/L Final  . CO2 09/14/2017 30  22 - 32 mmol/L Final  . Glucose, Bld 09/14/2017 94  65 - 99 mg/dL Final  . BUN 09/14/2017 23* 6 - 20 mg/dL Final  . Creatinine, Ser 09/14/2017 0.94  0.44  - 1.00 mg/dL Final  . Calcium 09/14/2017 9.5  8.9 - 10.3 mg/dL Final  . Total Protein 09/14/2017 7.1  6.5 - 8.1 g/dL Final  . Albumin 09/14/2017 3.5  3.5 - 5.0 g/dL Final  . AST 09/14/2017 25  15 - 41 U/L Final  . ALT 09/14/2017 18  14 - 54 U/L Final  . Alkaline Phosphatase 09/14/2017  95  38 - 126 U/L Final  . Total Bilirubin 09/14/2017 0.8  0.3 - 1.2 mg/dL Final  . GFR calc non Af Amer 09/14/2017 >60  >60 mL/min Final  . GFR calc Af Amer 09/14/2017 >60  >60 mL/min Final   Comment: (NOTE) The eGFR has been calculated using the CKD EPI equation. This calculation has not been validated in all clinical situations. eGFR's persistently <60 mL/min signify possible Chronic Kidney Disease.   Georgiann Hahn gap 09/14/2017 5  5 - 15 Final   Performed at [facility], [place] [address], [place], [place] [address]  . Prothrombin Time 09/14/2017 12.5  11.4 - 15.2 seconds Final  . INR 09/14/2017 0.94   Final   Performed at [facility], [place] [address], [place], [place] [address]  . ABO/RH(D) 09/14/2017 B POS   Final  . Antibody Screen 09/14/2017 NEG   Final  . Sample Expiration 09/14/2017 09/23/2017   Final  . Extend sample reason 09/14/2017    Final                   Value:NO TRANSFUSIONS OR PREGNANCY IN THE PAST 3 MONTHS Performed at [facility], [place] [address], [place], [place] [address]   . MRSA, PCR 09/14/2017 NEGATIVE  NEGATIVE Final  . Staphylococcus aureus 09/14/2017 NEGATIVE  NEGATIVE Final   Comment: (NOTE) The Xpert SA Assay (FDA approved for NASAL specimens in patients 10 years of age and older), is one component of a comprehensive surveillance program. It is not intended to diagnose infection nor to guide or monitor treatment. Performed at [facility], [place] [address] [place], [place] [address]   . ABO/RH(D) 09/14/2017    Final                   Value:B POS Performed at [facility], [place]  [address] [place],  [address]      X-Rays:Mm Diag Breast Tomo Bilateral  Result Date: 09/17/2017 CLINICAL DATA:  Bilateral lumpectomies.  Annual mammography. EXAM: DIGITAL DIAGNOSTIC BILATERAL MAMMOGRAM WITH CAD AND TOMO COMPARISON:  Previous exam(s). ACR Breast Density Category b: There are scattered areas of fibroglandular density. FINDINGS: Bilateral lumpectomy sites are stable. No suspicious masses, calcifications, or distortion. Mammographic images were processed with CAD. IMPRESSION: No mammographic evidence of malignancy. RECOMMENDATION: Annual diagnostic mammography. I have discussed the findings and recommendations with the patient. Results were also provided in writing at the conclusion of the visit. If applicable, a reminder letter will be sent to the patient regarding the next appointment. BI-RADS CATEGORY  2: Benign. Electronically Signed   By: Dorise Bullion III M.D   On: 09/17/2017 11:57    EKG: Orders placed or performed during the hospital encounter of 04/02/17  . EKG 12 lead  . EKG 12 lead     Hospital Course: Monica [name] is a 72 y.o. who was admitted to [facility]. They were brought to the operating room on 09/20/2017 and underwent Procedure(s): LEFT TOTAL KNEE ARTHROPLASTY.  Patient tolerated the procedure well and was later transferred to the recovery room and then to the orthopaedic floor for postoperative care.  They were given PO and IV analgesics for pain control following their surgery.  They were given 24 hours of postoperative antibiotics of  Anti-infectives (From admission, onward)   Start     Dose/Rate Route Frequency Ordered Stop   09/20/17 1400  ceFAZolin (ANCEF) IVPB 2g/100 mL premix  2 g 200 mL/hr over 30 Minutes Intravenous Every 6 hours 09/20/17 1102 09/20/17 2235   09/20/17 0548  ceFAZolin (ANCEF) IVPB 2g/100 mL premix     2 g 200 mL/hr over 30 Minutes Intravenous On call to O.R. 09/20/17 5597 09/20/17 0720     and started on DVT  prophylaxis in the form of Xarelto.   PT and OT were ordered for total joint protocol.  Discharge planning consulted to help with postop disposition and equipment needs.  Patient had a decent night on the evening of surgery.  They started to get up OOB with therapy on day one. Hemovac drain was pulled without difficulty.  Continued to work with therapy into day two.  Dressing was changed on day two and the incision was healing well.   Patient was seen in rounds and was ready to go home.   Diet - Cardiac diet Follow up - in 2 weeks Activity - WBAT Disposition - Home Condition Upon Discharge - pending at this time D/C Meds - See DC Summary DVT Prophylaxis - Xarelto     Discharge Instructions    Call MD / Call 911   Complete by:  As directed    If you experience chest pain or shortness of breath, CALL 911 and be transported to the hospital emergency room.  If you develope a fever above 101 F, pus (white drainage) or increased drainage or redness at the wound, or calf pain, call your surgeon's office.   Change dressing   Complete by:  As directed    Change dressing daily with sterile 4 x 4 inch gauze dressing and apply TED hose. Do not submerge the incision under water.   Constipation Prevention   Complete by:  As directed    Drink plenty of fluids.  Prune juice may be helpful.  You may use a stool softener, such as Colace (over the counter) 100 mg twice a day.  Use MiraLax (over the counter) for constipation as needed.   Diet - low sodium heart healthy   Complete by:  As directed    Discharge instructions   Complete by:  As directed    Take Xarelto for two and a half more weeks, then discontinue Xarelto. Once the patient has completed the blood thinner regimen, then take a Baby 81 mg Aspirin daily for three more weeks.   Pick up stool softner and laxative for home use following surgery while on pain medications. Do not submerge incision under water. Please use good hand washing  techniques while changing dressing each day. May shower starting three days after surgery. Please use a clean towel to pat the incision dry following showers. Continue to use ice for pain and swelling after surgery. Do not use any lotions or creams on the incision until instructed by your surgeon.  Wear both TED hose on both legs during the day every day for three weeks, but may remove the TED hose at night at home.  Postoperative Constipation Protocol  Constipation - defined medically as fewer than three stools per week and severe constipation as less than one stool per week.  One of the most common issues patients have following surgery is constipation.  Even if you have a regular bowel pattern at home, your normal regimen is likely to be disrupted due to multiple reasons following surgery.  Combination of anesthesia, postoperative narcotics, change in appetite and fluid intake all can affect your bowels.  In order to avoid complications following surgery, here  are some recommendations in order to help you during your recovery period.  Colace (docusate) - Pick up an over-the-counter form of Colace or another stool softener and take twice a day as long as you are requiring postoperative pain medications.  Take with a full glass of water daily.  If you experience loose stools or diarrhea, hold the colace until you stool forms back up.  If your symptoms do not get better within 1 week or if they get worse, check with your doctor.  Dulcolax (bisacodyl) - Pick up over-the-counter and take as directed by the product packaging as needed to assist with the movement of your bowels.  Take with a full glass of water.  Use this product as needed if not relieved by Colace only.   MiraLax (polyethylene glycol) - Pick up over-the-counter to have on hand.  MiraLax is a solution that will increase the amount of water in your bowels to assist with bowel movements.  Take as directed and can mix with a glass of  water, juice, soda, coffee, or tea.  Take if you go more than two days without a movement. Do not use MiraLax more than once per day. Call your doctor if you are still constipated or irregular after using this medication for 7 days in a row.  If you continue to have problems with postoperative constipation, please contact the office for further assistance and recommendations.  If you experience "the worst abdominal pain ever" or develop nausea or vomiting, please contact the office immediatly for further recommendations for treatment.   Do not put a pillow under the knee. Place it under the heel.   Complete by:  As directed    Do not sit on low chairs, stoools or toilet seats, as it may be difficult to get up from low surfaces   Complete by:  As directed    Driving restrictions   Complete by:  As directed    No driving until released by the physician.   Increase activity slowly as tolerated   Complete by:  As directed    Lifting restrictions   Complete by:  As directed    No lifting until released by the physician.   Patient may shower   Complete by:  As directed    You may shower without a dressing once there is no drainage.  Do not wash over the wound.  If drainage remains, do not shower until drainage stops.   TED hose   Complete by:  As directed    Use stockings (TED hose) for 3 weeks on both leg(s).  You may remove them at night for sleeping.   Weight bearing as tolerated   Complete by:  As directed    Laterality:  left   Extremity:  Lower     Allergies as of 09/22/2017      Reactions   Codeine Palpitations, Other (See Comments)   Heart rate increased, cold sweats    Adhesive [tape]    Pulls off skin      Medication List    STOP taking these medications   diclofenac sodium 1 % Gel Commonly known as:  VOLTAREN   Ginkgo Biloba 120 MG Caps   meloxicam 15 MG tablet Commonly known as:  MOBIC   oxyCODONE-acetaminophen 5-325 MG tablet Commonly known as:  PERCOCET/ROXICET      TAKE these medications   amLODipine 5 MG tablet Commonly known as:  NORVASC Take 5 mg by mouth daily.   anastrozole  1 MG tablet Commonly known as:  ARIMIDEX Take 1 tablet (1 mg total) by mouth daily.   CLEAR EYES OP Place 1 drop into both eyes daily as needed (dry eyes).   cloNIDine 0.1 mg/24hr patch Commonly known as:  CATAPRES-TTS-1 Place 1 patch (0.1 mg total) onto the skin once a week.   docusate sodium 100 MG capsule Commonly known as:  COLACE Take 100 mg by mouth daily as needed for mild constipation.   FISH OIL CONCENTRATE 300 MG Caps Take 1,200 mg by mouth daily.   gabapentin 300 MG capsule Commonly known as:  NEURONTIN Take 1 capsule (300 mg total) by mouth at bedtime.   levothyroxine 75 MCG tablet Commonly known as:  SYNTHROID, LEVOTHROID Take 75 mcg by mouth daily before breakfast.   methocarbamol 500 MG tablet Commonly known as:  ROBAXIN Take 1 tablet (500 mg total) by mouth every 6 (six) hours as needed for muscle spasms.   oxyCODONE 5 MG immediate release tablet Commonly known as:  Oxy IR/ROXICODONE Take 1-2 tablets (5-10 mg total) by mouth every 4 (four) hours as needed for moderate pain or severe pain.   potassium chloride SA 20 MEQ tablet Commonly known as:  K-DUR,KLOR-CON Take 20 mEq by mouth daily.   rivaroxaban 10 MG Tabs tablet Commonly known as:  XARELTO Take 1 tablet (10 mg total) by mouth daily with breakfast. Take Xarelto for two and a half more weeks following discharge from the hospital, then discontinue Xarelto. Once the patient has completed the blood thinner regimen, then take a Baby 81 mg Aspirin daily for three more weeks.   traMADol 50 MG tablet Commonly known as:  ULTRAM Take 1-2 tablets (50-100 mg total) by mouth every 6 (six) hours as needed for moderate pain.   valsartan-hydrochlorothiazide 320-25 MG tablet Commonly known as:  DIOVAN-HCT Take 1 tablet by mouth daily.   Vitamin D3 2000 units Tabs Take 2,000 Units by  mouth daily.            Discharge Care Instructions  (From admission, onward)        Start     Ordered   09/21/17 0000  Change dressing    Comments:  Change dressing daily with sterile 4 x 4 inch gauze dressing and apply TED hose. Do not submerge the incision under water.   09/21/17 2248   09/21/17 0000  Weight bearing as tolerated    Question Answer Comment  Laterality left   Extremity Lower      09/21/17 2248     Follow-up [facility] Follow up.   Why:  walker,bedside commode Contact information: [address] [place] [address] [phone]        [facility] Follow up.   Specialty:  [place] Why:  physical therapy Contact information: [address] [place] [address] [phone]        Gaynelle Arabian, MD. Schedule an appointment as soon as possible for a visit on 10/05/2017.   Specialty:  Orthopedic Surgery Contact information: [address] [place] [address] [phone]           Signed: Arlee Muslim, PA-C Orthopaedic Surgery 10/04/2017, 10:19 AM

## 2017-09-21 NOTE — Progress Notes (Signed)
   Subjective: 1 Day Post-Op Procedure(s) (LRB): LEFT TOTAL KNEE ARTHROPLASTY (Left) Patient reports pain as mild.   Patient seen in rounds with Dr. Wynelle Link. Patient is well, but has had some minor complaints of pain in the knee, requiring pain medications We will resume therapy today.  Plan is to go Home after hospital stay.  Objective: Vital signs in last 24 hours: Temp:  [97.4 F (36.3 C)-98.5 F (36.9 C)] 98.2 F (36.8 C) (04/02 1312) Pulse Rate:  [62-74] 62 (04/02 1312) Resp:  [16-18] 16 (04/02 1312) BP: (112-135)/(60-69) 135/63 (04/02 1312) SpO2:  [100 %] 100 % (04/02 1312)  Intake/Output from previous day:  Intake/Output Summary (Last 24 hours) at 09/21/2017 1847 Last data filed at 09/21/2017 1800 Gross per 24 hour  Intake 2543.75 ml  Output 1500 ml  Net 1043.75 ml    Intake/Output this shift: Total I/O In: 1432.5 [P.O.:720; I.V.:712.5] Out: 1350 [Urine:1350]  Labs: Recent Labs    09/21/17 0547  HGB 10.7*   Recent Labs    09/21/17 0547  WBC 7.4  RBC 3.70*  HCT 33.2*  PLT 203   Recent Labs    09/21/17 0547  NA 143  K 4.6  CL 110  CO2 30  BUN 16  CREATININE 0.83  GLUCOSE 139*  CALCIUM 9.1   No results for input(s): LABPT, INR in the last 72 hours.  EXAM General - Patient is Alert Extremity - Neurovascular intact Sensation intact distally Dressing - dressing C/D/I Motor Function - intact, moving foot and toes well on exam.  Hemovac pulled without difficulty.  Past Medical History:  Diagnosis Date  . Allergy codeine   rapid heart beat, cold sweat  . Arthritis   . Breast cancer ([place]) 01/10/14   Left breast INVASIVE DUCTAL CARCINOMA STAGE 2  . Breast cancer ([place]) 2018   Right breast   . Hypertension   . Hyperthyroidism    medication called in today for hyperthroid  . Personal history of chemotherapy 2015   Left Breast Cancer  . Personal history of radiation therapy 2015   Left Breast Cancer  . Personal history of radiation therapy  2018   Right breast cancer   . PONV (postoperative nausea and vomiting)   . S/P radiation therapy 09/21/14 completed   left breast    Assessment/Plan: 1 Day Post-Op Procedure(s) (LRB): LEFT TOTAL KNEE ARTHROPLASTY (Left) Principal Problem:   OA (osteoarthritis) of knee  Estimated body mass index is 35.83 kg/m as calculated from the following:   Height as of this encounter: 5\' 6"  (1.676 m).   Weight as of this encounter: 100.7 kg (222 lb). Advance diet Up with therapy Plan for discharge tomorrow  DVT Prophylaxis - Xarelto Weight-Bearing as tolerated to left leg D/C O2 and Pulse OX and try on Room Air  Arlee Muslim, PA-C Orthopaedic Surgery 09/21/2017, 6:47 PM

## 2017-09-22 LAB — BASIC METABOLIC PANEL
Anion gap: 7 (ref 5–15)
BUN: 14 mg/dL (ref 6–20)
CALCIUM: 9.1 mg/dL (ref 8.9–10.3)
CO2: 29 mmol/L (ref 22–32)
CREATININE: 0.74 mg/dL (ref 0.44–1.00)
Chloride: 101 mmol/L (ref 101–111)
GFR calc Af Amer: >60 mL/min (ref >60)
GFR calc non Af Amer: >60 mL/min (ref >60)
Glucose, Bld: 126 mg/dL — ABNORMAL HIGH (ref 65–99)
Potassium: 3.9 mmol/L (ref 3.5–5.1)
Sodium: 137 mmol/L (ref 135–145)

## 2017-09-22 LAB — CBC
HEMATOCRIT: 34 % — AB (ref 36.0–46.0)
Hemoglobin: 11.1 g/dL — ABNORMAL LOW (ref 12.0–15.0)
MCH: 29.2 pg (ref 26.0–34.0)
MCHC: 32.6 g/dL (ref 30.0–36.0)
MCV: 89.5 fL (ref 78.0–100.0)
Platelets: 227 10*3/uL (ref 150–400)
RBC: 3.8 MIL/uL — ABNORMAL LOW (ref 3.87–5.11)
RDW: 14.7 % (ref 11.5–15.5)
WBC: 8.6 10*3/uL (ref 4.0–10.5)

## 2017-09-22 MED ORDER — OXYCODONE HCL 5 MG PO TABS
5.0000 mg | ORAL_TABLET | ORAL | Status: DC | PRN
  Administered 2017-09-22: 10 mg via ORAL
  Administered 2017-09-22: 5 mg via ORAL
  Filled 2017-09-22: qty 2
  Filled 2017-09-22 (×2): qty 1

## 2017-09-22 MED ORDER — OXYCODONE HCL 5 MG PO TABS: 5.0000 mg | ORAL_TABLET | ORAL | 0 refills | Status: DC | PRN

## 2017-09-22 MED ORDER — TRAMADOL HCL 50 MG PO TABS: 50.0000 mg | ORAL_TABLET | Freq: Four times a day (QID) | ORAL | 0 refills | Status: DC | PRN

## 2017-09-22 NOTE — Progress Notes (Signed)
   Subjective: 2 Days Post-Op Procedure(s) (LRB): LEFT TOTAL KNEE ARTHROPLASTY (Left) Patient reports pain as mild and moderate.   Patient seen in rounds by Dr. Wynelle Link.  Nausea and vomiting last night.  Believed to be the pain medication. Will switch from Dilaudid to Oxycodone and see how she does.  If improves, maybe home this afternoon. Patient is well, but has had some minor complaints of nausea/vomiting and pain in the knee, requiring pain medications Patient might be ready to go home later today if improves.  Objective: Vital signs in last 24 hours: Temp:  [97.8 F (36.6 C)-98.4 F (36.9 C)] 97.8 F (36.6 C) (04/03 0445) Pulse Rate:  [62-66] 63 (04/03 0445) Resp:  [16-17] 17 (04/03 0445) BP: (130-151)/(59-67) 151/65 (04/03 0445) SpO2:  [98 %-100 %] 98 % (04/03 0445)  Intake/Output from previous day:  Intake/Output Summary (Last 24 hours) at 09/22/2017 0741 Last data filed at 09/22/2017 0555 Gross per 24 hour  Intake 1872.5 ml  Output 1350 ml  Net 522.5 ml    Intake/Output this shift: No intake/output data recorded.  Labs: Recent Labs    09/21/17 0547 09/22/17 0530  HGB 10.7* 11.1*   Recent Labs    09/21/17 0547 09/22/17 0530  WBC 7.4 8.6  RBC 3.70* 3.80*  HCT 33.2* 34.0*  PLT 203 227   Recent Labs    09/21/17 0547 09/22/17 0530  NA 143 137  K 4.6 3.9  CL 110 101  CO2 30 29  BUN 16 14  CREATININE 0.83 0.74  GLUCOSE 139* 126*  CALCIUM 9.1 9.1   No results for input(s): LABPT, INR in the last 72 hours.  EXAM: General - Patient is Alert Extremity - Neurovascular intact Sensation intact distally Intact pulses distally Incision - clean, dry, no drainage Motor Function - intact, moving foot and toes well on exam.   Assessment/Plan: 2 Days Post-Op Procedure(s) (LRB): LEFT TOTAL KNEE ARTHROPLASTY (Left) Procedure(s) (LRB): LEFT TOTAL KNEE ARTHROPLASTY (Left) Past Medical History:  Diagnosis Date  . Allergy codeine   rapid heart beat, cold sweat    . Arthritis   . Breast cancer ([place]) 01/10/14   Left breast INVASIVE DUCTAL CARCINOMA STAGE 2  . Breast cancer ([place]) 2018   Right breast   . Hypertension   . Hyperthyroidism    medication called in today for hyperthroid  . Personal history of chemotherapy 2015   Left Breast Cancer  . Personal history of radiation therapy 2015   Left Breast Cancer  . Personal history of radiation therapy 2018   Right breast cancer   . PONV (postoperative nausea and vomiting)   . S/P radiation therapy 09/21/14 completed   left breast   Principal Problem:   OA (osteoarthritis) of knee  Estimated body mass index is 35.83 kg/m as calculated from the following:   Height as of this encounter: 5\' 6"  (1.676 m).   Weight as of this encounter: 100.7 kg (222 lb). Up with therapy Diet - Cardiac diet Follow up - in 2 weeks Activity - WBAT Disposition - Home Condition Upon Discharge - pending at this time D/C Meds - See DC Summary DVT Prophylaxis - [place], PA-C Orthopaedic Surgery 09/22/2017, 7:41 AM

## 2017-09-22 NOTE — Care Management Note (Signed)
Case Management Note  Patient Details  Name: ROSEZELLA KRONICK MRN: [id number] Date of Birth: 1946-01-15  Subjective/Objective: [facility] rep Santiago Glad aware of HHPT, home rw,3n1 to be delivered to rm prior d/c. No further CM needs.                   Action/Plan:d/c home w/HHC/dme.   Expected Discharge Date:  09/22/17               Expected Discharge Plan:  [place]  In-House Referral:  NA  Discharge planning Services  CM Consult  Post Acute Care Choice:  Durable Medical Equipment, Home Health Choice offered to:  Patient  DME Arranged:  Walker rolling, 3-N-1 DME Agency:  [place]:  [facility] [place] Agency:  [place]  Status of Service:  Completed, signed off  If discussed at [place] of Stay Meetings, dates discussed:    Additional Comments:  Dessa Phi, RN 09/22/2017, 11:31 AM

## 2017-09-22 NOTE — Progress Notes (Signed)
Physical Therapy Treatment Patient Details Name: AERICA [name] MRN: [id number] DOB: 01-Mar-1946 Today's Date: 09/22/2017    History of Present Illness L TKA, DJD on right knee    PT Comments    The patient practiced steps, episodes of dry heaving. Will check back this PM for DC planning.   Follow Up Recommendations  Home health PT;Follow surgeon's recommendation for DC plan and follow-up therapies     Equipment Recommendations  Rolling walker with 5" wheels;Crutches    Recommendations for Other Services       Precautions / Restrictions Precautions Precautions: Knee Required Braces or Orthoses: Knee Immobilizer - Left Knee Immobilizer - Left: Discontinue once straight leg raise with < 10 degree lag Restrictions Weight Bearing Restrictions: No    Mobility  Bed Mobility         Supine to sit: Min assist     General bed mobility comments: in recliner, uses leg lifter to raise and lower left leg  to floor  Transfers   Equipment used: Rolling walker (2 wheeled) Transfers: Sit to/from Stand Sit to Stand: Min guard Stand pivot transfers: Min guard       General transfer comment: cues for UE/LE placement.  used leg lifter for support when scooting in chair  Ambulation/Gait Ambulation/Gait assistance: Min assist Ambulation Distance (Feet): 60 Feet Assistive device: Rolling walker (2 wheeled) Gait Pattern/deviations: Step-to pattern;Step-through pattern;Antalgic     General Gait Details: cues for sequence   Stairs Stairs: Yes   Stair Management: One rail Left;Step to pattern;Forwards;With crutches Number of Stairs: 2 General stair comments: sister present for instructions  Wheelchair Mobility    Modified Rankin (Stroke Patients Only)       Balance                                            Cognition Arousal/Alertness: Awake/alert Behavior During Therapy: WFL for tasks assessed/performed Overall Cognitive Status: Within  Functional Limits for tasks assessed                                        Exercises Total Joint Exercises Ankle Circles/Pumps: AROM;10 reps;Both Quad Sets: AROM;10 reps;Both Towel Squeeze: AROM;Both;10 reps Heel Slides: AAROM;Left;10 reps Hip ABduction/ADduction: AAROM;Left;10 reps Straight Leg Raises: AAROM;Left;10 reps Goniometric ROM: 10-45 left knee flexion    General Comments        Pertinent Vitals/Pain Pain Score: 6  Pain Location: left knee and thigh Pain Descriptors / Indicators: Discomfort;Operative site guarding;Sore Pain Intervention(s): Monitored during session;Patient requesting pain meds-RN notified;Ice applied    Home Living Family/patient expects to be discharged to:: Private residence Living Arrangements: Spouse/significant other;Other relatives Available Help at Discharge: Family         Home Equipment: Gilford Rile - 2 wheels Additional Comments: Pt's toilet is in a closet; tight quarters. She does not have 3:1 at home and is interested in this    Prior Function Level of Independence: Independent          PT Goals (current goals can now be found in the care plan section) Acute Rehab PT Goals Patient Stated Goal: to go home Progress towards PT goals: Progressing toward goals    Frequency    7X/week      PT Plan Current plan remains appropriate  Co-evaluation              AM-PAC PT "6 Clicks" Daily Activity  Outcome Measure  Difficulty turning over in bed (including adjusting bedclothes, sheets and blankets)?: A Little Difficulty moving from lying on back to sitting on the side of the bed? : A Little Difficulty sitting down on and standing up from a chair with arms (e.g., wheelchair, bedside commode, etc,.)?: A Little Help needed moving to and from a bed to chair (including a wheelchair)?: A Lot Help needed walking in hospital room?: A Lot Help needed climbing 3-5 steps with a railing? : A Lot 6 Click Score: 15     End of Session Equipment Utilized During Treatment: Left knee immobilizer Activity Tolerance: Patient tolerated treatment well Patient left: in chair;with call bell/phone within reach;with family/visitor present Nurse Communication: Mobility status PT Visit Diagnosis: Unsteadiness on feet (R26.81);Pain Pain - Right/Left: Left Pain - part of body: Knee     Time: 4920-1007 PT Time Calculation (min) (ACUTE ONLY): 43 min  Charges:  $Gait Training: 8-22 mins $Therapeutic Exercise: 8-22 mins $Self Care/Home Management: 8-22                    G CodesTresa [name] PT [phone]    Monica [name] 09/22/2017, 1:44 PM

## 2017-09-22 NOTE — Evaluation (Signed)
Occupational Therapy Evaluation Patient Details Name: Monica [name] MRN: [id number] DOB: Dec 08, 1945 Today's Date: 09/22/2017    History of Present Illness L TKA, DJD on right knee   Clinical Impression   This 72 year old female was admitted for the above sx. She will benefit from continued OT in acute to further educate on bathroom transfers. Pt tolerated OT well during evaluation; she has been dealing with N/V up until this am.     Follow Up Recommendations  Supervision/Assistance - 24 hour;Follow surgeon's recommendation for DC plan and follow-up therapies    Equipment Recommendations  3 in 1 bedside commode    Recommendations for Other Services       Precautions / Restrictions Precautions Precautions: Knee Required Braces or Orthoses: Knee Immobilizer - Left Knee Immobilizer - Left: Discontinue once straight leg raise with < 10 degree lag Restrictions Weight Bearing Restrictions: No      Mobility Bed Mobility         Supine to sit: Min assist     General bed mobility comments: used leg lifter  Transfers   Equipment used: Rolling walker (2 wheeled)   Sit to Stand: Min guard Stand pivot transfers: Min guard       General transfer comment: cues for UE/LE placement.  used leg lifter for support when scooting in chair    Balance                                           ADL either performed or assessed with clinical judgement   ADL Overall ADL's : Needs assistance/impaired     Grooming: Set up;Sitting   Upper Body Bathing: Set up;Sitting   Lower Body Bathing: Minimal assistance;Sit to/from stand   Upper Body Dressing : Set up;Sitting   Lower Body Dressing: Maximal assistance;Sit to/from stand   Toilet Transfer: Min guard;Stand-pivot;RW(chair)   Toileting- Water quality scientist and Hygiene: Min guard;Sit to/from stand         General ADL Comments: sister present; educated on [place].  Pt has been using leg lifter and she wants  to get this.  Reviewed other AE and options.She could get a long netted sponge on a stick and use tongs for adls. 2 sisters will stay with her through Monday.  Worked on securing/unsecuring straps on KI while in chair. Recommended she loosen it and keep it positioned when she is alone until LLE strength improves     Vision         Perception     Praxis      Pertinent Vitals/Pain Pain Score: 6  Pain Location: left knee and thigh Pain Descriptors / Indicators: Discomfort;Operative site guarding;Sore Pain Intervention(s): Limited activity within patient's tolerance;Monitored during session;Premedicated before session;Repositioned;Ice applied     Hand Dominance     Extremity/Trunk Assessment Upper Extremity Assessment Upper Extremity Assessment: Overall WFL for tasks assessed           Communication Communication Communication: No difficulties   Cognition Arousal/Alertness: Awake/alert Behavior During Therapy: WFL for tasks assessed/performed Overall Cognitive Status: Within Functional Limits for tasks assessed                                     General Comments       Exercises     Shoulder Instructions  Home Living Family/patient expects to be discharged to:: Private residence Living Arrangements: Spouse/significant other;Other relatives Available Help at Discharge: Family               Bathroom Shower/Tub: Walk-in shower   Bathroom Toilet: Handicapped height     Home Equipment: Environmental consultant - 2 wheels   Additional Comments: Pt's toilet is in a closet; tight quarters. She does not have 3:1 at home and is interested in this      Prior Functioning/Environment Level of Independence: Independent                 OT Problem List: Decreased strength;Decreased activity tolerance;Decreased knowledge of use of DME or AE;Pain      OT Treatment/Interventions: Self-care/ADL training;DME and/or AE instruction;Patient/family education    OT  Goals(Current goals can be found in the care plan section) Acute Rehab OT Goals Patient Stated Goal: to go home OT Goal Formulation: With patient Time For Goal Achievement: 10/06/17 Potential to Achieve Goals: Good ADL Goals Pt Will Transfer to Toilet: with supervision;ambulating;bedside commode Pt Will Perform Tub/Shower Transfer: Shower transfer;ambulating;3 in 1;with min guard assist  OT Frequency: Min 2X/week   Barriers to D/C:            Co-evaluation              AM-PAC PT "6 Clicks" Daily Activity     Outcome Measure Help from another person eating meals?: None Help from another person taking care of personal grooming?: A Little Help from another person toileting, which includes using toliet, bedpan, or urinal?: A Little Help from another person bathing (including washing, rinsing, drying)?: A Little Help from another person to put on and taking off regular upper body clothing?: A Little Help from another person to put on and taking off regular lower body clothing?: A Lot 6 Click Score: 18   End of Session    Activity Tolerance: Patient tolerated treatment well Patient left: in chair;with call bell/phone within reach;with family/visitor present  OT Visit Diagnosis: Pain Pain - Right/Left: Left Pain - part of body: Knee                Time: 9326-7124 OT Time Calculation (min): 34 min Charges:  OT General Charges $OT Visit: 1 Visit OT Evaluation $OT Eval Low Complexity: 1 Low OT Treatments $Self Care/Home Management : 8-22 mins G-Codes:     [place], OTR/L [phone] 09/22/2017  Natajah Derderian 09/22/2017, 11:00 AM

## 2017-09-22 NOTE — Progress Notes (Signed)
Physical Therapy Treatment Patient Details Name: Monica [name] MRN: [id number] DOB: 1946/04/12 Today's Date: 09/22/2017    History of Present Illness L TKA, DJD on right knee    PT Comments    The patient is improving. Will DC if able to keep food down. Reviewed  Use of KI, to not wear KI after she gets into the house to encourage  Knee flexion on left. Leg lifter is very helpful. Verbally reviewed steps with crutch and rail.   Follow Up Recommendations  Home health PT;Follow surgeon's recommendation for DC plan and follow-up therapies     Equipment Recommendations  Rolling walker with 5" wheels;Crutches    Recommendations for Other Services       Precautions / Restrictions Precautions Precautions: Knee Required Braces or Orthoses: Knee Immobilizer - Left Knee Immobilizer - Left: Discontinue once straight leg raise with < 10 degree lag    Mobility  Bed Mobility           Sit to supine: Modified independent (Device/Increase time)   General bed mobility comments: used leg lifter  Transfers Overall transfer level: Needs assistance Equipment used: Rolling walker (2 wheeled) Transfers: Sit to/from Stand Sit to Stand: Min guard         General transfer comment: mod I use of leg lifter  Ambulation/Gait Ambulation/Gait assistance: Supervision Ambulation Distance (Feet): 20 Feet Assistive device: Rolling walker (2 wheeled) Gait Pattern/deviations: Step-to pattern;Step-through pattern;Antalgic     General Gait Details: coming from BR, KI  with ice inside. Reviewed to not have ice when ambulating   Stairs Stairs: Yes   Stair Management: One rail Left;Step to pattern;Forwards;With crutches Number of Stairs: 2 General stair comments: sister present for instructions  Wheelchair Mobility    Modified Rankin (Stroke Patients Only)       Balance                                            Cognition Arousal/Alertness: Awake/alert                                             Exercises Total Joint Exercises Ankle Circles/Pumps: AROM;10 reps;Both Quad Sets: AROM;10 reps;Both Towel Squeeze: AROM;Both;10 reps Heel Slides: AAROM;Left;10 reps Hip ABduction/ADduction: AAROM;Left;10 reps Straight Leg Raises: AAROM;Left;10 reps Goniometric ROM: 10-45 left knee flexion    General Comments        Pertinent Vitals/Pain Pain Score: 3  Pain Location: left knee and thigh Pain Descriptors / Indicators: Discomfort;Operative site guarding;Sore Pain Intervention(s): Monitored during session    Home Living                      Prior Function            PT Goals (current goals can now be found in the care plan section) Progress towards PT goals: Progressing toward goals    Frequency    7X/week      PT Plan Current plan remains appropriate    Co-evaluation              AM-PAC PT "6 Clicks" Daily Activity  Outcome Measure  Difficulty turning over in bed (including adjusting bedclothes, sheets and blankets)?: None Difficulty moving from lying on back to sitting on the  side of the bed? : None Difficulty sitting down on and standing up from a chair with arms (e.g., wheelchair, bedside commode, etc,.)?: A Little Help needed moving to and from a bed to chair (including a wheelchair)?: A Little Help needed walking in hospital room?: A Little Help needed climbing 3-5 steps with a railing? : A Lot 6 Click Score: 19    End of Session Equipment Utilized During Treatment: Left knee immobilizer Activity Tolerance: Patient tolerated treatment well Patient left: in chair;with call bell/phone within reach;with family/visitor present Nurse Communication: Mobility status PT Visit Diagnosis: Unsteadiness on feet (R26.81);Pain Pain - Right/Left: Left Pain - part of body: Knee     Time: 7342-8768 PT Time Calculation (min) (ACUTE ONLY): 9 min  Charges:  $Gait Training: 8-22 mins $Therapeutic  Exercise: 8-22 mins $Self Care/Home Management: 8-22                    G CodesTresa Endo PT [phone]    [name] 09/22/2017, 3:36 PM

## 2017-09-22 NOTE — Progress Notes (Signed)
OT Cancellation Note  Patient Details Name: Monica [name] MRN: [id number] DOB: 1946/02/04   Cancelled Treatment:    Reason Eval/Treat Not Completed: Other (comment). Pt verbalizes understanding of shower transfer; she has practiced steps. She also has gone to bathroom earlier.  No further OT needs.  Suhayb Anzalone 09/22/2017, 1:35 PM  Lesle Chris, OTR/L [phone] 09/22/2017

## 2017-09-23 DIAGNOSIS — T451X5S Adverse effect of antineoplastic and immunosuppressive drugs, sequela: Secondary | ICD-10-CM | POA: Diagnosis not present

## 2017-09-23 DIAGNOSIS — I10 Essential (primary) hypertension: Secondary | ICD-10-CM | POA: Diagnosis not present

## 2017-09-23 DIAGNOSIS — Z4801 Encounter for change or removal of surgical wound dressing: Secondary | ICD-10-CM | POA: Diagnosis not present

## 2017-09-23 DIAGNOSIS — M1711 Unilateral primary osteoarthritis, right knee: Secondary | ICD-10-CM | POA: Diagnosis not present

## 2017-09-23 DIAGNOSIS — M47896 Other spondylosis, lumbar region: Secondary | ICD-10-CM | POA: Diagnosis not present

## 2017-09-23 DIAGNOSIS — G620 Drug-induced polyneuropathy: Secondary | ICD-10-CM | POA: Diagnosis not present

## 2017-09-23 DIAGNOSIS — Z471 Aftercare following joint replacement surgery: Secondary | ICD-10-CM | POA: Diagnosis not present

## 2017-09-23 DIAGNOSIS — Z96652 Presence of left artificial knee joint: Secondary | ICD-10-CM | POA: Diagnosis not present

## 2017-09-23 DIAGNOSIS — E039 Hypothyroidism, unspecified: Secondary | ICD-10-CM | POA: Diagnosis not present

## 2017-09-23 DIAGNOSIS — Z7982 Long term (current) use of aspirin: Secondary | ICD-10-CM | POA: Diagnosis not present

## 2017-09-24 DIAGNOSIS — I10 Essential (primary) hypertension: Secondary | ICD-10-CM | POA: Diagnosis not present

## 2017-09-24 DIAGNOSIS — T451X5S Adverse effect of antineoplastic and immunosuppressive drugs, sequela: Secondary | ICD-10-CM | POA: Diagnosis not present

## 2017-09-24 DIAGNOSIS — Z96652 Presence of left artificial knee joint: Secondary | ICD-10-CM | POA: Diagnosis not present

## 2017-09-24 DIAGNOSIS — Z4801 Encounter for change or removal of surgical wound dressing: Secondary | ICD-10-CM | POA: Diagnosis not present

## 2017-09-24 DIAGNOSIS — M1711 Unilateral primary osteoarthritis, right knee: Secondary | ICD-10-CM | POA: Diagnosis not present

## 2017-09-24 DIAGNOSIS — Z7982 Long term (current) use of aspirin: Secondary | ICD-10-CM | POA: Diagnosis not present

## 2017-09-24 DIAGNOSIS — M47896 Other spondylosis, lumbar region: Secondary | ICD-10-CM | POA: Diagnosis not present

## 2017-09-24 DIAGNOSIS — G620 Drug-induced polyneuropathy: Secondary | ICD-10-CM | POA: Diagnosis not present

## 2017-09-24 DIAGNOSIS — E039 Hypothyroidism, unspecified: Secondary | ICD-10-CM | POA: Diagnosis not present

## 2017-09-24 DIAGNOSIS — Z471 Aftercare following joint replacement surgery: Secondary | ICD-10-CM | POA: Diagnosis not present

## 2017-09-27 DIAGNOSIS — Z7982 Long term (current) use of aspirin: Secondary | ICD-10-CM | POA: Diagnosis not present

## 2017-09-27 DIAGNOSIS — E039 Hypothyroidism, unspecified: Secondary | ICD-10-CM | POA: Diagnosis not present

## 2017-09-27 DIAGNOSIS — Z96652 Presence of left artificial knee joint: Secondary | ICD-10-CM | POA: Diagnosis not present

## 2017-09-27 DIAGNOSIS — Z4801 Encounter for change or removal of surgical wound dressing: Secondary | ICD-10-CM | POA: Diagnosis not present

## 2017-09-27 DIAGNOSIS — I10 Essential (primary) hypertension: Secondary | ICD-10-CM | POA: Diagnosis not present

## 2017-09-27 DIAGNOSIS — M47896 Other spondylosis, lumbar region: Secondary | ICD-10-CM | POA: Diagnosis not present

## 2017-09-27 DIAGNOSIS — M1711 Unilateral primary osteoarthritis, right knee: Secondary | ICD-10-CM | POA: Diagnosis not present

## 2017-09-27 DIAGNOSIS — T451X5S Adverse effect of antineoplastic and immunosuppressive drugs, sequela: Secondary | ICD-10-CM | POA: Diagnosis not present

## 2017-09-27 DIAGNOSIS — G620 Drug-induced polyneuropathy: Secondary | ICD-10-CM | POA: Diagnosis not present

## 2017-09-27 DIAGNOSIS — Z471 Aftercare following joint replacement surgery: Secondary | ICD-10-CM | POA: Diagnosis not present

## 2017-09-29 DIAGNOSIS — G620 Drug-induced polyneuropathy: Secondary | ICD-10-CM | POA: Diagnosis not present

## 2017-09-29 DIAGNOSIS — M47896 Other spondylosis, lumbar region: Secondary | ICD-10-CM | POA: Diagnosis not present

## 2017-09-29 DIAGNOSIS — E039 Hypothyroidism, unspecified: Secondary | ICD-10-CM | POA: Diagnosis not present

## 2017-09-29 DIAGNOSIS — Z471 Aftercare following joint replacement surgery: Secondary | ICD-10-CM | POA: Diagnosis not present

## 2017-09-29 DIAGNOSIS — Z4801 Encounter for change or removal of surgical wound dressing: Secondary | ICD-10-CM | POA: Diagnosis not present

## 2017-09-29 DIAGNOSIS — Z7982 Long term (current) use of aspirin: Secondary | ICD-10-CM | POA: Diagnosis not present

## 2017-09-29 DIAGNOSIS — I10 Essential (primary) hypertension: Secondary | ICD-10-CM | POA: Diagnosis not present

## 2017-09-29 DIAGNOSIS — T451X5S Adverse effect of antineoplastic and immunosuppressive drugs, sequela: Secondary | ICD-10-CM | POA: Diagnosis not present

## 2017-09-29 DIAGNOSIS — M1711 Unilateral primary osteoarthritis, right knee: Secondary | ICD-10-CM | POA: Diagnosis not present

## 2017-09-29 DIAGNOSIS — Z96652 Presence of left artificial knee joint: Secondary | ICD-10-CM | POA: Diagnosis not present

## 2017-10-01 DIAGNOSIS — E039 Hypothyroidism, unspecified: Secondary | ICD-10-CM | POA: Diagnosis not present

## 2017-10-01 DIAGNOSIS — M1711 Unilateral primary osteoarthritis, right knee: Secondary | ICD-10-CM | POA: Diagnosis not present

## 2017-10-01 DIAGNOSIS — Z471 Aftercare following joint replacement surgery: Secondary | ICD-10-CM | POA: Diagnosis not present

## 2017-10-01 DIAGNOSIS — Z4801 Encounter for change or removal of surgical wound dressing: Secondary | ICD-10-CM | POA: Diagnosis not present

## 2017-10-01 DIAGNOSIS — Z7982 Long term (current) use of aspirin: Secondary | ICD-10-CM | POA: Diagnosis not present

## 2017-10-01 DIAGNOSIS — M47896 Other spondylosis, lumbar region: Secondary | ICD-10-CM | POA: Diagnosis not present

## 2017-10-01 DIAGNOSIS — Z96652 Presence of left artificial knee joint: Secondary | ICD-10-CM | POA: Diagnosis not present

## 2017-10-01 DIAGNOSIS — G620 Drug-induced polyneuropathy: Secondary | ICD-10-CM | POA: Diagnosis not present

## 2017-10-01 DIAGNOSIS — T451X5S Adverse effect of antineoplastic and immunosuppressive drugs, sequela: Secondary | ICD-10-CM | POA: Diagnosis not present

## 2017-10-01 DIAGNOSIS — I10 Essential (primary) hypertension: Secondary | ICD-10-CM | POA: Diagnosis not present

## 2017-10-04 DIAGNOSIS — E039 Hypothyroidism, unspecified: Secondary | ICD-10-CM | POA: Diagnosis not present

## 2017-10-04 DIAGNOSIS — Z96652 Presence of left artificial knee joint: Secondary | ICD-10-CM | POA: Diagnosis not present

## 2017-10-04 DIAGNOSIS — Z7982 Long term (current) use of aspirin: Secondary | ICD-10-CM | POA: Diagnosis not present

## 2017-10-04 DIAGNOSIS — G620 Drug-induced polyneuropathy: Secondary | ICD-10-CM | POA: Diagnosis not present

## 2017-10-04 DIAGNOSIS — I10 Essential (primary) hypertension: Secondary | ICD-10-CM | POA: Diagnosis not present

## 2017-10-04 DIAGNOSIS — Z4801 Encounter for change or removal of surgical wound dressing: Secondary | ICD-10-CM | POA: Diagnosis not present

## 2017-10-04 DIAGNOSIS — T451X5S Adverse effect of antineoplastic and immunosuppressive drugs, sequela: Secondary | ICD-10-CM | POA: Diagnosis not present

## 2017-10-04 DIAGNOSIS — M47896 Other spondylosis, lumbar region: Secondary | ICD-10-CM | POA: Diagnosis not present

## 2017-10-04 DIAGNOSIS — M1711 Unilateral primary osteoarthritis, right knee: Secondary | ICD-10-CM | POA: Diagnosis not present

## 2017-10-04 DIAGNOSIS — Z471 Aftercare following joint replacement surgery: Secondary | ICD-10-CM | POA: Diagnosis not present

## 2017-10-06 DIAGNOSIS — T451X5S Adverse effect of antineoplastic and immunosuppressive drugs, sequela: Secondary | ICD-10-CM | POA: Diagnosis not present

## 2017-10-06 DIAGNOSIS — Z4801 Encounter for change or removal of surgical wound dressing: Secondary | ICD-10-CM | POA: Diagnosis not present

## 2017-10-06 DIAGNOSIS — Z471 Aftercare following joint replacement surgery: Secondary | ICD-10-CM | POA: Diagnosis not present

## 2017-10-06 DIAGNOSIS — G620 Drug-induced polyneuropathy: Secondary | ICD-10-CM | POA: Diagnosis not present

## 2017-10-06 DIAGNOSIS — M1711 Unilateral primary osteoarthritis, right knee: Secondary | ICD-10-CM | POA: Diagnosis not present

## 2017-10-06 DIAGNOSIS — M47896 Other spondylosis, lumbar region: Secondary | ICD-10-CM | POA: Diagnosis not present

## 2017-10-06 DIAGNOSIS — I10 Essential (primary) hypertension: Secondary | ICD-10-CM | POA: Diagnosis not present

## 2017-10-06 DIAGNOSIS — Z96652 Presence of left artificial knee joint: Secondary | ICD-10-CM | POA: Diagnosis not present

## 2017-10-06 DIAGNOSIS — Z7982 Long term (current) use of aspirin: Secondary | ICD-10-CM | POA: Diagnosis not present

## 2017-10-06 DIAGNOSIS — E039 Hypothyroidism, unspecified: Secondary | ICD-10-CM | POA: Diagnosis not present

## 2017-10-08 DIAGNOSIS — G620 Drug-induced polyneuropathy: Secondary | ICD-10-CM | POA: Diagnosis not present

## 2017-10-08 DIAGNOSIS — Z4801 Encounter for change or removal of surgical wound dressing: Secondary | ICD-10-CM | POA: Diagnosis not present

## 2017-10-08 DIAGNOSIS — T451X5S Adverse effect of antineoplastic and immunosuppressive drugs, sequela: Secondary | ICD-10-CM | POA: Diagnosis not present

## 2017-10-08 DIAGNOSIS — Z471 Aftercare following joint replacement surgery: Secondary | ICD-10-CM | POA: Diagnosis not present

## 2017-10-08 DIAGNOSIS — Z7982 Long term (current) use of aspirin: Secondary | ICD-10-CM | POA: Diagnosis not present

## 2017-10-08 DIAGNOSIS — I10 Essential (primary) hypertension: Secondary | ICD-10-CM | POA: Diagnosis not present

## 2017-10-08 DIAGNOSIS — Z96652 Presence of left artificial knee joint: Secondary | ICD-10-CM | POA: Diagnosis not present

## 2017-10-08 DIAGNOSIS — M47896 Other spondylosis, lumbar region: Secondary | ICD-10-CM | POA: Diagnosis not present

## 2017-10-08 DIAGNOSIS — M1711 Unilateral primary osteoarthritis, right knee: Secondary | ICD-10-CM | POA: Diagnosis not present

## 2017-10-08 DIAGNOSIS — E039 Hypothyroidism, unspecified: Secondary | ICD-10-CM | POA: Diagnosis not present

## 2017-10-11 DIAGNOSIS — M25662 Stiffness of left knee, not elsewhere classified: Secondary | ICD-10-CM | POA: Diagnosis not present

## 2017-10-13 DIAGNOSIS — M25662 Stiffness of left knee, not elsewhere classified: Secondary | ICD-10-CM | POA: Diagnosis not present

## 2017-10-15 DIAGNOSIS — M25662 Stiffness of left knee, not elsewhere classified: Secondary | ICD-10-CM | POA: Diagnosis not present

## 2017-10-18 DIAGNOSIS — J45991 Cough variant asthma: Secondary | ICD-10-CM | POA: Diagnosis not present

## 2017-10-18 DIAGNOSIS — Z853 Personal history of malignant neoplasm of breast: Secondary | ICD-10-CM | POA: Diagnosis not present

## 2017-10-18 DIAGNOSIS — I10 Essential (primary) hypertension: Secondary | ICD-10-CM | POA: Diagnosis not present

## 2017-10-18 DIAGNOSIS — M170 Bilateral primary osteoarthritis of knee: Secondary | ICD-10-CM | POA: Diagnosis not present

## 2017-10-18 DIAGNOSIS — M25662 Stiffness of left knee, not elsewhere classified: Secondary | ICD-10-CM | POA: Diagnosis not present

## 2017-10-18 DIAGNOSIS — E039 Hypothyroidism, unspecified: Secondary | ICD-10-CM | POA: Diagnosis not present

## 2017-10-20 DIAGNOSIS — M25662 Stiffness of left knee, not elsewhere classified: Secondary | ICD-10-CM | POA: Diagnosis not present

## 2017-10-22 DIAGNOSIS — M25662 Stiffness of left knee, not elsewhere classified: Secondary | ICD-10-CM | POA: Diagnosis not present

## 2017-10-26 DIAGNOSIS — G629 Polyneuropathy, unspecified: Secondary | ICD-10-CM | POA: Diagnosis not present

## 2017-10-26 DIAGNOSIS — E669 Obesity, unspecified: Secondary | ICD-10-CM | POA: Diagnosis not present

## 2017-10-26 DIAGNOSIS — Z471 Aftercare following joint replacement surgery: Secondary | ICD-10-CM | POA: Diagnosis not present

## 2017-10-26 DIAGNOSIS — G8929 Other chronic pain: Secondary | ICD-10-CM | POA: Diagnosis not present

## 2017-10-26 DIAGNOSIS — I10 Essential (primary) hypertension: Secondary | ICD-10-CM | POA: Diagnosis not present

## 2017-10-26 DIAGNOSIS — E039 Hypothyroidism, unspecified: Secondary | ICD-10-CM | POA: Diagnosis not present

## 2017-10-26 DIAGNOSIS — Z6834 Body mass index (BMI) 34.0-34.9, adult: Secondary | ICD-10-CM | POA: Diagnosis not present

## 2017-10-26 DIAGNOSIS — C50919 Malignant neoplasm of unspecified site of unspecified female breast: Secondary | ICD-10-CM | POA: Diagnosis not present

## 2017-10-26 DIAGNOSIS — K5900 Constipation, unspecified: Secondary | ICD-10-CM | POA: Diagnosis not present

## 2017-10-26 DIAGNOSIS — Z79811 Long term (current) use of aromatase inhibitors: Secondary | ICD-10-CM | POA: Diagnosis not present

## 2017-10-26 DIAGNOSIS — Z791 Long term (current) use of non-steroidal anti-inflammatories (NSAID): Secondary | ICD-10-CM | POA: Diagnosis not present

## 2017-10-26 DIAGNOSIS — Z96652 Presence of left artificial knee joint: Secondary | ICD-10-CM | POA: Diagnosis not present

## 2017-10-27 DIAGNOSIS — M25662 Stiffness of left knee, not elsewhere classified: Secondary | ICD-10-CM | POA: Diagnosis not present

## 2017-10-29 DIAGNOSIS — M25662 Stiffness of left knee, not elsewhere classified: Secondary | ICD-10-CM | POA: Diagnosis not present

## 2017-11-02 DIAGNOSIS — M25662 Stiffness of left knee, not elsewhere classified: Secondary | ICD-10-CM | POA: Diagnosis not present

## 2017-11-04 DIAGNOSIS — M25662 Stiffness of left knee, not elsewhere classified: Secondary | ICD-10-CM | POA: Diagnosis not present

## 2017-11-04 DIAGNOSIS — Z79899 Other long term (current) drug therapy: Secondary | ICD-10-CM | POA: Diagnosis not present

## 2017-11-04 DIAGNOSIS — R69 Illness, unspecified: Secondary | ICD-10-CM | POA: Diagnosis not present

## 2017-11-04 DIAGNOSIS — M5126 Other intervertebral disc displacement, lumbar region: Secondary | ICD-10-CM | POA: Diagnosis not present

## 2017-11-04 DIAGNOSIS — Z6833 Body mass index (BMI) 33.0-33.9, adult: Secondary | ICD-10-CM | POA: Diagnosis not present

## 2017-11-04 DIAGNOSIS — M5416 Radiculopathy, lumbar region: Secondary | ICD-10-CM | POA: Diagnosis not present

## 2017-11-09 DIAGNOSIS — E039 Hypothyroidism, unspecified: Secondary | ICD-10-CM | POA: Diagnosis not present

## 2017-11-09 DIAGNOSIS — H33193 Other retinoschisis and retinal cysts, bilateral: Secondary | ICD-10-CM | POA: Diagnosis not present

## 2017-11-09 DIAGNOSIS — H31091 Other chorioretinal scars, right eye: Secondary | ICD-10-CM | POA: Diagnosis not present

## 2017-11-09 DIAGNOSIS — Z853 Personal history of malignant neoplasm of breast: Secondary | ICD-10-CM | POA: Diagnosis not present

## 2017-11-09 DIAGNOSIS — I10 Essential (primary) hypertension: Secondary | ICD-10-CM | POA: Diagnosis not present

## 2017-11-09 DIAGNOSIS — M170 Bilateral primary osteoarthritis of knee: Secondary | ICD-10-CM | POA: Diagnosis not present

## 2017-11-09 DIAGNOSIS — M25662 Stiffness of left knee, not elsewhere classified: Secondary | ICD-10-CM | POA: Diagnosis not present

## 2017-11-09 DIAGNOSIS — J45991 Cough variant asthma: Secondary | ICD-10-CM | POA: Diagnosis not present

## 2017-11-10 NOTE — Progress Notes (Signed)
[facility]  Telephone:([phone]) [phone] Fax:(336) [phone]     ID: Monica [name] DOB: 1946-01-09  MR#: [id number]  HDQ#:[id number]  Patient Care Team: Maury Dus, MD as PCP - General (Family Medicine) Excell Seltzer, MD as Consulting Physician (General Surgery) Haley Roza, Virgie Dad, MD as Consulting Physician (Oncology) Maisie Fus, MD as Consulting Physician (Obstetrics and Gynecology) Kennith Center, RD as Dietitian (Family Medicine) Marlaine Hind, MD as Consulting Physician (Physical Medicine and Rehabilitation) Gaynelle Arabian, MD as Consulting Physician (Orthopedic Surgery)   CHIEF COMPLAINT: Ductal carcinoma in situ, estrogen receptor positive  CURRENT TREATMENT: Anastrozole  BREAST CANCER HISTORY: From the original intake note:  [name] showing a possible mass in her left breast. On 01/05/2014 left diagnostic mammography and ultrasonography at the breast Center showed 2 adjacent poorly defined masses deep in the lower outer portion of the left breast. On physical exam these were palpable measuring approximately 3 cm altogether, at the 4:00 position 16 cm from the left nipple. There were no palpable left axillary lymph nodes. Ultrasound confirmed to and adjacent irregularly marginated hypoechoic masses in the area in question, measuring 1.5 and 1.0 cm respectively. The total area of by ultrasonography measured 2.6 cm. In addition, to left axillary lymph nodes showed mild cortical thickening in one of them showed loss of the normal fatty hilum.  On 01/10/2014 the patient underwent separate biopsies of both the left breast masses in question as well as the more suspicious lymph node. The final pathology ([id number]) showed the lymph node to be benign. (This was interpreted as concordant). Both of the breast masses, however, were positive for an invasive ductal carcinoma, which was HER-2 not amplified,  with the signals ratio of 0.98 and the number per cell being 2.45. Estrogen and progesterone receptor determination is pending but looks negative at this point.  On 01/16/2014 the patient underwent bilateral breast MRI the right breast was unremarkable. In the left breast lower outer quadrant there were 2 oval masses measuring 1.5 and 2.2 cm. Taken together they was approximately 4 cm of disease. There were no findings of concern in the right breast. In the left axilla there was a single mildly prominent lymph node. There was no suspicious internal mammary adenopathy.  The patient's subsequent history is as detailed below  INTERVAL HISTORY: Monica [name] returns today for a follow-up and treatment of her essentially triple negative breast cancer (very weak estrogen positivity) and her estrogen receptor positive ductal carcinoma in situ.   She continues on anastrozole, which she tolerates well.  She does have significant hot flashes but she controls them with the TTS 1 patch and the nighttime gabapentin  On 09/17/2017 she underwent a Diagnostic Bilateral Mammogram with CAD and TOMO, breast density category B, showing no evidence of malignancy.   REVIEW OF SYSTEMS: Monica [name] is doing well overall. She had a total left knee replacement 09/20/2017. She tolerated the procedure well and has been doing well. Next year she will get her right knee replaced. The patient denies unusual headaches, visual changes, nausea, vomiting, or dizziness. There has been no unusual cough, phlegm production, or pleurisy. This been no change in bowel or bladder habits. The patient denies unexplained fatigue or unexplained weight loss, bleeding, rash, or fever. A detailed review of systems was otherwise noncontributory.   PAST MEDICAL HISTORY: Past Medical History:  Diagnosis Date  . Allergy codeine   rapid heart beat, cold sweat  . Arthritis   .  Breast cancer ([facility]) 01/10/14   Left breast INVASIVE DUCTAL CARCINOMA STAGE 2  . Breast  cancer ([facility]) 2018   Right breast   . Hypertension   . Hyperthyroidism    medication called in today for hyperthroid  . Personal history of chemotherapy 2015   Left Breast Cancer  . Personal history of radiation therapy 2015   Left Breast Cancer  . Personal history of radiation therapy 2018   Right breast cancer   . PONV (postoperative nausea and vomiting)   . S/P radiation therapy 09/21/14 completed   left breast    PAST SURGICAL HISTORY: Past Surgical History:  Procedure Laterality Date  . ABDOMINAL HYSTERECTOMY  1992  . BREAST LUMPECTOMY Left 02/12/2014  . BREAST LUMPECTOMY Right 2018  . BREAST LUMPECTOMY WITH NEEDLE LOCALIZATION AND AXILLARY SENTINEL LYMPH NODE BX Left 02/12/2014   Procedure: LEFT BREAST LUMPECTOMY WITH NEEDLE LOCALIZATION AND LEFT AXILLARY SENTINEL LYMPH NODE BX;  Surgeon: [name], MD;  Location: [facility];  Service: General;  Laterality: Left;  . BREAST LUMPECTOMY WITH RADIOACTIVE SEED LOCALIZATION Right 04/02/2017   Procedure: BREAST LUMPECTOMY WITH RADIOACTIVE SEED LOCALIZATION;  Surgeon: Excell Seltzer, MD;  Location: [facility];  Service: General;  Laterality: Right;  . COLONOSCOPY    . FOOT ARTHROPLASTY  1998   right  . KNEE ARTHROSCOPY  2012   right  . LUMBAR LAMINECTOMY  1991  . PORTACATH PLACEMENT Right 02/12/2014   Procedure: INSERTION PORT-A-CATH;  Surgeon: [name], MD;  Location: [facility];  Service: General;  Laterality: Right;  . TOTAL KNEE ARTHROPLASTY Left 09/20/2017   Procedure: LEFT TOTAL KNEE ARTHROPLASTY;  Surgeon: Gaynelle Arabian, MD;  Location: [facility];  Service: Orthopedics;  Laterality: Left;  Adductor Block    FAMILY HISTORY Family History  Problem Relation Age of Onset  . Cancer Sister   . Cancer Brother   . Cancer Maternal Aunt   . Cancer Paternal Aunt    the patient's father died at the age of 59 from a stroke. The patient's mother died at the age of 44 from  "multiple causes". The patient had 2 brothers and 2 sisters. The patient's sister was diagnosed with uterine cancer at the age of 5, and the patient's brother [name] had "bone cancer" and died in his 24s. He was treated at the [facility]. There is no history of breast or ovarian cancer in the family to the patient's knowledge.  GYNECOLOGIC HISTORY:  No LMP recorded. Patient has had a hysterectomy. Menarche age 78, the patient is GX P0. She underwent hysterectomy in 1992, with no salpingo-oophorectomy. She has been on estrogen replacement since. 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 Her husband [name] worked for [employer] in [place] as a Facilities manager. The patient's adopted son [name] works as a laborer in [place]. The patient's god-daughter [name] is an Optometrist. The patient has no grandchildren. The patient attends a local church of  [place], and her husband [name] is pastor   ADVANCED DIRECTIVES: In place   HEALTH MAINTENANCE: Social History   Tobacco Use  . Smoking status: Never Smoker  . Smokeless tobacco: Never Used  Substance Use Topics  . Alcohol use: No  . Drug use: No     Colonoscopy: October 2008  PAP: July 2015  Bone density: 01/01/2014  Lipid panel:  Allergies  Allergen Reactions  . Codeine Palpitations and Other (See Comments)    Heart rate increased, cold sweats   . Adhesive [Tape]     Pulls off skin    Current Outpatient Medications  Medication Sig Dispense Refill  . amLODipine (NORVASC) 5 MG tablet Take 5 mg by mouth daily.    [name] anastrozole (ARIMIDEX) 1 MG tablet Take 1 tablet (1 mg total) by mouth daily. 90 tablet 4  . Cholecalciferol (VITAMIN D3) 2000 UNITS TABS Take 2,000 Units by mouth daily.     . cloNIDine (CATAPRES-TTS-1) 0.1 mg/24hr patch Place 1 patch (0.1 mg total) onto the  skin once a week. 12 patch 3  . docusate sodium (COLACE) 100 MG capsule Take 100 mg by mouth daily as needed for mild constipation.     . gabapentin (NEURONTIN) 300 MG capsule Take 1 capsule (300 mg total) by mouth at bedtime. 90 capsule 0  . levothyroxine (SYNTHROID, LEVOTHROID) 75 MCG tablet Take 75 mcg by mouth daily before breakfast.    . methocarbamol (ROBAXIN) 500 MG tablet Take 1 tablet (500 mg total) by mouth every 6 (six) hours as needed for muscle spasms. 80 tablet 0  . Naphazoline HCl (CLEAR EYES OP) Place 1 drop into both eyes daily as needed (dry eyes).    . Omega-3 Fatty Acids (FISH OIL CONCENTRATE) 300 MG CAPS Take 1,200 mg by mouth daily.     [name] oxyCODONE (OXY IR/ROXICODONE) 5 MG immediate release tablet Take 1-2 tablets (5-10 mg total) by mouth every 4 (four) hours as needed for moderate pain or severe pain. 80 tablet 0  . potassium chloride SA (K-DUR,KLOR-CON) 20 MEQ tablet Take 20 mEq by mouth daily.     . rivaroxaban (XARELTO) 10 MG TABS tablet Take 1 tablet (10 mg total) by mouth daily with breakfast. Take Xarelto for two and a half more weeks following discharge from the hospital, then discontinue Xarelto. Once the patient has completed the blood thinner regimen, then take a Baby 81 mg Aspirin daily for three more weeks. 19 tablet 0  . traMADol (ULTRAM) 50 MG tablet Take 1-2 tablets (50-100 mg total) by mouth every 6 (six) hours as needed for moderate pain. 56 tablet 0  . valsartan-hydrochlorothiazide (DIOVAN-HCT) 320-25 MG per tablet Take 1 tablet by mouth daily.     No current facility-administered medications for this visit.     OBJECTIVE: Middle-aged [place] American woman who appears stated age   57:   11/11/17 1005  BP: 138/77  Pulse: 79  Resp: 18  Temp: 98.7 F (37.1 C)  SpO2: 100%     Body mass index is 34.72 kg/m.    ECOG FS:1 - Symptomatic but completely ambulatory  Sclerae unicteric, EOMs intact Oropharynx clear and moist No cervical or  supraclavicular adenopathy Lungs no rales or rhonchi Heart regular rate and rhythm Abd soft, nontender, positive bowel sounds MSK no focal spinal tenderness, no upper extremity lymphedema Neuro: nonfocal, well oriented, appropriate affect Breasts: The right breast is status post lumpectomy followed by radiation, with some hyperpigmentation remaining, but improved.  There is no evidence of recurrence.  The left breast is status post lumpectomy and radiation.  There is no evidence of recurrence.  Both axillae are benign.   LAB RESULTS:  CMP     Component Value Date/Time   NA 137 09/22/2017 0530   NA 142 04/26/2017 1126   K 3.9 09/22/2017 0530   K 4.0 04/26/2017 1126   CL  101 09/22/2017 0530   CO2 29 09/22/2017 0530   CO2 28 04/26/2017 1126   GLUCOSE 126 (H) 09/22/2017 0530   GLUCOSE 90 04/26/2017 1126   BUN 14 09/22/2017 0530   BUN 18.0 04/26/2017 1126   CREATININE 0.74 09/22/2017 0530   CREATININE 0.9 04/26/2017 1126   CALCIUM 9.1 09/22/2017 0530   CALCIUM 9.9 04/26/2017 1126   PROT 7.1 09/14/2017 1010   PROT 7.5 04/26/2017 1126   ALBUMIN 3.5 09/14/2017 1010   ALBUMIN 3.6 04/26/2017 1126   AST 25 09/14/2017 1010   AST 21 04/26/2017 1126   ALT 18 09/14/2017 1010   ALT 15 04/26/2017 1126   ALKPHOS 95 09/14/2017 1010   ALKPHOS 99 04/26/2017 1126   BILITOT 0.8 09/14/2017 1010   BILITOT 0.52 04/26/2017 1126   GFRNONAA >60 09/22/2017 0530   GFRAA >60 09/22/2017 0530    I No results found for: SPEP  Lab Results  Component Value Date   WBC 3.2 (L) 11/11/2017   NEUTROABS 2.0 11/11/2017   HGB 12.2 11/11/2017   HCT 37.7 11/11/2017   MCV 91.1 11/11/2017   PLT 253 11/11/2017      Chemistry      Component Value Date/Time   NA 137 09/22/2017 0530   NA 142 04/26/2017 1126   K 3.9 09/22/2017 0530   K 4.0 04/26/2017 1126   CL 101 09/22/2017 0530   CO2 29 09/22/2017 0530   CO2 28 04/26/2017 1126   BUN 14 09/22/2017 0530   BUN 18.0 04/26/2017 1126   CREATININE 0.74  09/22/2017 0530   CREATININE 0.9 04/26/2017 1126      Component Value Date/Time   CALCIUM 9.1 09/22/2017 0530   CALCIUM 9.9 04/26/2017 1126   ALKPHOS 95 09/14/2017 1010   ALKPHOS 99 04/26/2017 1126   AST 25 09/14/2017 1010   AST 21 04/26/2017 1126   ALT 18 09/14/2017 1010   ALT 15 04/26/2017 1126   BILITOT 0.8 09/14/2017 1010   BILITOT 0.52 04/26/2017 1126       No results found for: LABCA2  No components found for: LABCA125  No results for input(s): INR in the last 168 hours.  Urinalysis    Component Value Date/Time   COLORURINE AMBER (A) 01/16/2016 1751    STUDIES: On 09/17/2017 she underwent a Diagnostic Bilateral Mammogram with CAD and TOMO, breast density category B, showing no evidence of malignancy.   ASSESSMENT: 72 y.o. [place] woman status post left breast lower outer quadrant biopsy of 2 separate lower outer quadrant masses 01/10/2014 for a multifocal cT2 cNX, stage II invasive ductal carcinoma, grade 3, with an MIB-1 of 90%, HER-2 negative, estrogen receptors 3% positive with weak staining, progesterone receptor negative, with an MIB-1 of 90%; this is clinically a "triple-negative" breast cancer and will be treated as such  (1) a suspicious left axillary lymph node biopsied 01/10/2014 was negative (concordant)  (2) left lumpectomy and sentinel lymph node sampling 02/12/2014 showed an mpT2 pN0, stage IIA invasive ductal carcinoma grade 3, repeat HER-2 again negative  (3) adjuvant chemotherapy consisted of  (a) dose dense doxorubicin and cyclophosphamide x4 completed 05/02/2014  (b) weekly paclitaxel and carboplatin with 3 of 12 doses given, then stopped because of neuropathy  (c) Carboplatin and Gemzar started 06/11/2014, given on day 1 & day 8 of a 21 days cycle for 3 cycles, completed 07/30/2014  (4) adjuvant radiation 08/27/2014 through 09/21/2014:  The patient initially received a dose of 42.5 Gy in 17 fractions to the left  breast using whole-breast  tangent fields. This was delivered using a 3-D conformal technique. The patient then received a boost to the seroma. This delivered an additional 7.5 Gy in 3 fractions using a 3 field photon technique due to the depth of the seroma. The total dose was 50 Gy.  (5) tamoxifen started (prophylactically) in December 2016; stopped after 2 weeks due to side effects.   (a) given the very weak and low estrogen receptor positivity, resumption of anti-estrogens not anticipated  (6) right breast upper outer quadrant biopsy 04/16/2016 showed no evidence of malignancy  (7) right lumpectomy April 02, 2017 showed ductal carcinoma in situ, grade 1, measuring 0.2 cm, with negative margins.  This was estrogen receptor 100% positive and progesterone receptor 95% positive.  (8) anastrozole started 05/03/2017  (a) bone density at the breast center 09/18/2016 showed a T score of 0.2 (NORMAL)  (9) adjuvant radiation12/12/18-01/10/19 Site/dose:  Right breast/ 42.5 Gy in 17 fractions    PLAN:  Monica [name] is now half a year out from definitive surgery for her noninvasive right-sided breast cancer.  She is just about 4 years out from definitive surgery for her left-sided invasive breast cancer.  There is no evidence of active disease.  This is very favorable.  She is tolerating anastrozole well.  The plan will be to continue that a total of 5 years.  At this point I am comfortable seeing her on a once a year basis.  She will need a bone density next year with her next mammogram and I have written for that  She knows to call for any issues that may develop before that visit.  Sindia Kowalczyk, Virgie Dad, MD  11/11/17 10:15 AM Medical Oncology and Hematology [facility] [address] [place], [place] [address] Tel. [phone]    Fax. [phone]  This document serves as a record of services personally performed by Chauncey Cruel, MD. It was created on his behalf by Margit Banda, a trained medical  scribe. The creation of this record is based on the scribe's personal observations and the provider's statements to them.   I have reviewed the above documentation for accuracy and completeness, and I agree with the above.

## 2017-11-11 ENCOUNTER — Inpatient Hospital Stay: Payer: Medicare HMO | Attending: Oncology

## 2017-11-11 ENCOUNTER — Telehealth: Payer: Self-pay | Admitting: Oncology

## 2017-11-11 ENCOUNTER — Inpatient Hospital Stay: Payer: Medicare HMO | Admitting: Oncology

## 2017-11-11 VITALS — BP 138/77 | HR 79 | Temp 98.7°F | Resp 18 | Ht 66.0 in | Wt 215.1 lb

## 2017-11-11 DIAGNOSIS — Z9221 Personal history of antineoplastic chemotherapy: Secondary | ICD-10-CM | POA: Diagnosis not present

## 2017-11-11 DIAGNOSIS — C50211 Malignant neoplasm of upper-inner quadrant of right female breast: Secondary | ICD-10-CM

## 2017-11-11 DIAGNOSIS — R232 Flushing: Secondary | ICD-10-CM

## 2017-11-11 DIAGNOSIS — Z923 Personal history of irradiation: Secondary | ICD-10-CM

## 2017-11-11 DIAGNOSIS — Z79811 Long term (current) use of aromatase inhibitors: Secondary | ICD-10-CM | POA: Diagnosis not present

## 2017-11-11 DIAGNOSIS — Z79899 Other long term (current) drug therapy: Secondary | ICD-10-CM | POA: Insufficient documentation

## 2017-11-11 DIAGNOSIS — M25662 Stiffness of left knee, not elsewhere classified: Secondary | ICD-10-CM | POA: Diagnosis not present

## 2017-11-11 DIAGNOSIS — C50512 Malignant neoplasm of lower-outer quadrant of left female breast: Secondary | ICD-10-CM

## 2017-11-11 DIAGNOSIS — E0590 Thyrotoxicosis, unspecified without thyrotoxic crisis or storm: Secondary | ICD-10-CM | POA: Insufficient documentation

## 2017-11-11 DIAGNOSIS — I10 Essential (primary) hypertension: Secondary | ICD-10-CM | POA: Diagnosis not present

## 2017-11-11 DIAGNOSIS — Z170 Estrogen receptor positive status [ER+]: Secondary | ICD-10-CM

## 2017-11-11 DIAGNOSIS — D0511 Intraductal carcinoma in situ of right breast: Secondary | ICD-10-CM | POA: Diagnosis not present

## 2017-11-11 LAB — CBC WITH DIFFERENTIAL/PLATELET
Basophils Absolute: 0 10*3/uL (ref 0.0–0.1)
Basophils Relative: 1 %
EOS ABS: 0.1 10*3/uL (ref 0.0–0.5)
EOS PCT: 3 %
HCT: 37.7 % (ref 34.8–46.6)
Hemoglobin: 12.2 g/dL (ref 11.6–15.9)
LYMPHS ABS: 1 10*3/uL (ref 0.9–3.3)
Lymphocytes Relative: 30 %
MCH: 29.5 pg (ref 25.1–34.0)
MCHC: 32.4 g/dL (ref 31.5–36.0)
MCV: 91.1 fL (ref 79.5–101.0)
Monocytes Absolute: 0.2 10*3/uL (ref 0.1–0.9)
Monocytes Relative: 5 %
Neutro Abs: 2 10*3/uL (ref 1.5–6.5)
Neutrophils Relative %: 61 %
PLATELETS: 253 10*3/uL (ref 145–400)
RBC: 4.14 MIL/uL (ref 3.70–5.45)
RDW: 14.9 % — ABNORMAL HIGH (ref 11.2–14.5)
WBC: 3.2 10*3/uL — ABNORMAL LOW (ref 3.9–10.3)

## 2017-11-11 LAB — COMPREHENSIVE METABOLIC PANEL
ALK PHOS: 92 U/L (ref 40–150)
ALT: 19 U/L (ref 0–55)
AST: 19 U/L (ref 5–34)
Albumin: 3.6 g/dL (ref 3.5–5.0)
Anion gap: 7 (ref 3–11)
BILIRUBIN TOTAL: 0.4 mg/dL (ref 0.2–1.2)
BUN: 14 mg/dL (ref 7–26)
CHLORIDE: 106 mmol/L (ref 98–109)
CO2: 28 mmol/L (ref 22–29)
CREATININE: 0.98 mg/dL (ref 0.60–1.10)
Calcium: 10.1 mg/dL (ref 8.4–10.4)
GFR calc Af Amer: >60 mL/min (ref >60)
GFR calc non Af Amer: 58 mL/min — ABNORMAL LOW (ref >60)
Glucose, Bld: 116 mg/dL (ref 70–140)
Potassium: 3.8 mmol/L (ref 3.5–5.1)
Sodium: 141 mmol/L (ref 136–145)
Total Protein: 7.1 g/dL (ref 6.4–8.3)

## 2017-11-11 NOTE — Telephone Encounter (Signed)
Gave patient AVs and calendar of upcoming may 2020 appointments °

## 2017-11-16 DIAGNOSIS — M25662 Stiffness of left knee, not elsewhere classified: Secondary | ICD-10-CM | POA: Diagnosis not present

## 2017-11-18 DIAGNOSIS — M25662 Stiffness of left knee, not elsewhere classified: Secondary | ICD-10-CM | POA: Diagnosis not present

## 2017-11-23 DIAGNOSIS — M25662 Stiffness of left knee, not elsewhere classified: Secondary | ICD-10-CM | POA: Diagnosis not present

## 2017-11-25 DIAGNOSIS — M25662 Stiffness of left knee, not elsewhere classified: Secondary | ICD-10-CM | POA: Diagnosis not present

## 2017-11-30 DIAGNOSIS — Z96652 Presence of left artificial knee joint: Secondary | ICD-10-CM | POA: Diagnosis not present

## 2017-11-30 DIAGNOSIS — M1712 Unilateral primary osteoarthritis, left knee: Secondary | ICD-10-CM | POA: Diagnosis not present

## 2017-11-30 DIAGNOSIS — Z471 Aftercare following joint replacement surgery: Secondary | ICD-10-CM | POA: Diagnosis not present

## 2018-01-12 DIAGNOSIS — Z9012 Acquired absence of left breast and nipple: Secondary | ICD-10-CM | POA: Diagnosis not present

## 2018-01-12 DIAGNOSIS — Z9011 Acquired absence of right breast and nipple: Secondary | ICD-10-CM | POA: Diagnosis not present

## 2018-02-01 DIAGNOSIS — D0511 Intraductal carcinoma in situ of right breast: Secondary | ICD-10-CM | POA: Diagnosis not present

## 2018-02-02 ENCOUNTER — Telehealth: Payer: Self-pay

## 2018-02-02 NOTE — Telephone Encounter (Signed)
Pt called to report that she has been experiencing worsening joint pain for a few months now. She is taking anastrozole daily since feb2019. Pt recently had knee surgery and will need her other knee operated on as well. Pt having difficult time with pain in her knees, shoulders, back and elbow.   Advised that pt take anastrozole at night since she is currently taking during the day. If it doesn't help within 1 week. She can stop anastrozole for 2 weeks. She will need to call back to let us know if she stops taking it. If symptoms improve, she may restart anastrozole every other day or call to have an alternative medication sent to her pharmacy. Encourage pt to drink plenty of fluids and to keep active to help with joint mobility. Pt verbalized understanding.

## 2018-02-03 DIAGNOSIS — M5416 Radiculopathy, lumbar region: Secondary | ICD-10-CM | POA: Diagnosis not present

## 2018-02-03 DIAGNOSIS — Z79899 Other long term (current) drug therapy: Secondary | ICD-10-CM | POA: Diagnosis not present

## 2018-02-03 DIAGNOSIS — M5126 Other intervertebral disc displacement, lumbar region: Secondary | ICD-10-CM | POA: Diagnosis not present

## 2018-02-03 DIAGNOSIS — M47812 Spondylosis without myelopathy or radiculopathy, cervical region: Secondary | ICD-10-CM | POA: Diagnosis not present

## 2018-02-18 DIAGNOSIS — Z1211 Encounter for screening for malignant neoplasm of colon: Secondary | ICD-10-CM | POA: Diagnosis not present

## 2018-02-18 DIAGNOSIS — K635 Polyp of colon: Secondary | ICD-10-CM | POA: Diagnosis not present

## 2018-02-18 DIAGNOSIS — K640 First degree hemorrhoids: Secondary | ICD-10-CM | POA: Diagnosis not present

## 2018-02-18 DIAGNOSIS — K5730 Diverticulosis of large intestine without perforation or abscess without bleeding: Secondary | ICD-10-CM | POA: Diagnosis not present

## 2018-02-22 ENCOUNTER — Other Ambulatory Visit: Payer: Self-pay | Admitting: Orthopedic Surgery

## 2018-02-22 NOTE — Care Plan (Signed)
R TKA scheduled on 03-21-18 DCP:  Home with spouse, sister, and dtr.  1 story home with 4 ste. DME:  No needs.  Has a RW and 3-in-1. PT:  [facility].  PT eval scheduled for 03-25-18.

## 2018-02-22 NOTE — H&P (Signed)
TOTAL KNEE ADMISSION H&P  Patient is being admitted for right total knee arthroplasty.  Subjective:  Chief Complaint:right knee pain.  HPI: Monica [name], 72 y.o. female, has a history of pain and functional disability in the right knee due to arthritis and has failed non-surgical conservative treatments for greater than 12 weeks to includecorticosteriod injections, viscosupplementation injections and activity modification.  Onset of symptoms was gradual, starting >10 years ago with gradually worsening course since that time. The patient noted prior procedures on the knee to include  arthroscopy on the right knee(s).  Patient currently rates pain in the right knee(s) at 10 out of 10 with activity. Patient has pain that interferes with activities of daily living, crepitus, joint swelling and instability.  Patient has evidence of severe bone-on-bone arthritis of all 3 compartments by imaging studies. There is no active infection.  Patient Active Problem List   Diagnosis Date Noted  . OA (osteoarthritis) of knee 09/20/2017  . Malignant neoplasm of upper-inner quadrant of right breast in female, estrogen receptor positive ([place]) 02/24/2017  . Hot flashes 04/05/2015  . Other pancytopenia ([place]) 06/28/2014  . Corneal abrasion 06/27/2014  . Anemia associated with chemotherapy 06/25/2014  . Thrombocytopenia ([place]) 06/25/2014  . Chemotherapy-induced neuropathy ([place]) 06/04/2014  . Decreased appetite 05/28/2014  . Body aches 05/28/2014  . Dyspepsia 05/28/2014  . Insomnia 05/02/2014  . UTI (urinary tract infection) 04/17/2014  . Febrile neutropenia ([place]) 04/15/2014  . Hypokalemia 04/15/2014  . Hypertension 04/15/2014  . Itchy eyes 04/09/2014  . Heart palpitations 04/04/2014  . Nausea without vomiting 03/26/2014  . Heartburn 03/26/2014  . Thrush 03/26/2014  . Breast cancer of lower-outer quadrant of left female breast ([place]) 01/12/2014   Past Medical History:  Diagnosis Date  . Allergy codeine    rapid heart beat, cold sweat  . Arthritis   . Breast cancer ([place]) 01/10/14   Left breast INVASIVE DUCTAL CARCINOMA STAGE 2  . Breast cancer ([place]) 2018   Right breast   . Hypertension   . Hyperthyroidism    medication called in today for hyperthroid  . Personal history of chemotherapy 2015   Left Breast Cancer  . Personal history of radiation therapy 2015   Left Breast Cancer  . Personal history of radiation therapy 2018   Right breast cancer   . PONV (postoperative nausea and vomiting)   . S/P radiation therapy 09/21/14 completed   left breast    Past Surgical History:  Procedure Laterality Date  . ABDOMINAL HYSTERECTOMY  1992  . BREAST LUMPECTOMY Left 02/12/2014  . BREAST LUMPECTOMY Right 2018  . BREAST LUMPECTOMY WITH NEEDLE LOCALIZATION AND AXILLARY SENTINEL LYMPH NODE BX Left 02/12/2014   Procedure: LEFT BREAST LUMPECTOMY WITH NEEDLE LOCALIZATION AND LEFT AXILLARY SENTINEL LYMPH NODE BX;  Surgeon: [name], MD;  Location: [facility];  Service: General;  Laterality: Left;  . BREAST LUMPECTOMY WITH RADIOACTIVE SEED LOCALIZATION Right 04/02/2017   Procedure: BREAST LUMPECTOMY WITH RADIOACTIVE SEED LOCALIZATION;  Surgeon: Excell Seltzer, MD;  Location: [facility];  Service: General;  Laterality: Right;  . COLONOSCOPY    . FOOT ARTHROPLASTY  1998   right  . KNEE ARTHROSCOPY  2012   right  . LUMBAR LAMINECTOMY  1991  . PORTACATH PLACEMENT Right 02/12/2014   Procedure: INSERTION PORT-A-CATH;  Surgeon: [name], MD;  Location: [facility];  Service: General;  Laterality: Right;  . TOTAL KNEE ARTHROPLASTY Left 09/20/2017   Procedure: LEFT TOTAL KNEE ARTHROPLASTY;  Surgeon: Gaynelle Arabian, MD;  Location: [facility];  Service: Orthopedics;  Laterality: Left;  Adductor Block    No current facility-administered medications for this encounter.    Current Outpatient Medications  Medication Sig Dispense Refill Last Dose  .  amLODipine (NORVASC) 5 MG tablet Take 5 mg by mouth daily.   09/20/2017 at 0400  . anastrozole (ARIMIDEX) 1 MG tablet Take 1 tablet (1 mg total) by mouth daily. 90 tablet 4 09/20/2017 at 0400  . Cholecalciferol (VITAMIN D3) 2000 UNITS TABS Take 2,000 Units by mouth daily.    09/14/2017  . cloNIDine (CATAPRES-TTS-1) 0.1 mg/24hr patch Place 1 patch (0.1 mg total) onto the skin once a week. 12 patch 3 09/20/2017 at Unknown time  . docusate sodium (COLACE) 100 MG capsule Take 100 mg by mouth daily as needed for mild constipation.    09/17/2017  . gabapentin (NEURONTIN) 300 MG capsule Take 1 capsule (300 mg total) by mouth at bedtime. 90 capsule 0 09/19/2017 at Unknown time  . levothyroxine (SYNTHROID, LEVOTHROID) 75 MCG tablet Take 75 mcg by mouth daily before breakfast.   09/20/2017 at 0400  . Naphazoline HCl (CLEAR EYES OP) Place 1 drop into both eyes daily as needed (dry eyes).   09/19/2017 at Unknown time  . Omega-3 Fatty Acids (FISH OIL CONCENTRATE) 300 MG CAPS Take 1,200 mg by mouth daily.    09/14/2017  . oxyCODONE (OXY IR/ROXICODONE) 5 MG immediate release tablet Take 1-2 tablets (5-10 mg total) by mouth every 4 (four) hours as needed for moderate pain or severe pain. 80 tablet 0   . potassium chloride SA (K-DUR,KLOR-CON) 20 MEQ tablet Take 20 mEq by mouth daily.    09/19/2017 at Unknown time  . rivaroxaban (XARELTO) 10 MG TABS tablet Take 1 tablet (10 mg total) by mouth daily with breakfast. Take Xarelto for two and a half more weeks following discharge from the hospital, then discontinue Xarelto. Once the patient has completed the blood thinner regimen, then take a Baby 81 mg Aspirin daily for three more weeks. 19 tablet 0   . traMADol (ULTRAM) 50 MG tablet Take 1-2 tablets (50-100 mg total) by mouth every 6 (six) hours as needed for moderate pain. 56 tablet 0   . valsartan-hydrochlorothiazide (DIOVAN-HCT) 320-25 MG per tablet Take 1 tablet by mouth daily.   09/20/2017 at 0400   Allergies  Allergen Reactions    . Codeine Palpitations and Other (See Comments)    Heart rate increased, cold sweats   . Adhesive [Tape]     Pulls off skin    Social History   Tobacco Use  . Smoking status: Never Smoker  . Smokeless tobacco: Never Used  Substance Use Topics  . Alcohol use: No    Family History  Problem Relation Age of Onset  . Cancer Sister   . Cancer Brother   . Cancer Maternal Aunt   . Cancer Paternal Aunt      Review of Systems  Constitutional: Negative for chills and fever.  HENT: Negative for congestion, sore throat and tinnitus.   Eyes: Negative for double vision, photophobia and pain.  Respiratory: Negative for cough, shortness of breath and wheezing.   Cardiovascular: Negative for chest pain, palpitations and orthopnea.  Gastrointestinal: Negative for heartburn, nausea and vomiting.  Genitourinary: Negative for dysuria, frequency and urgency.  Musculoskeletal: Positive for joint pain.  Neurological: Negative for dizziness, weakness and headaches.  Psychiatric/Behavioral: Negative for depression.    Objective:  Physical Exam  Well nourished and  well developed. General: Alert and oriented x3, cooperative and pleasant, no acute distress. Head: normocephalic, atraumatic, neck supple. Eyes: EOMI. Respiratory: breath sounds clear in all fields, no wheezing, rales, or rhonchi. Cardiovascular: Regular rate and rhythm, no murmurs, gallops or rubs.  Abdomen: non-tender to palpation and soft, normoactive bowel sounds. Musculoskeletal: RIGHT knee range about 5-115 with marked crepitus on range of motion and tenderness medial greater than lateral with no instability noted. Varus deformity. Calves soft and nontender. Motor function intact in LE. Strength 5/5 LE bilaterally. Neuro: Distal pulses 2+. Sensation to light touch intact in LE.  Vital signs in last 24 hours: Blood pressure: 124/76 mmHg   Labs:   Estimated body mass index is 34.72 kg/m as calculated from the following:    Height as of 11/11/17: 5\' 6"  (1.676 m).   Weight as of 11/11/17: 97.6 kg.   Imaging Review Plain radiographs demonstrate severe degenerative joint disease of the right knee(s). The overall alignment issignificant varus. The bone quality appears to be adequate for age and reported activity level.   Preoperative templating of the joint replacement has been completed, documented, and submitted to the Operating Room personnel in order to optimize intra-operative equipment management.   Anticipated LOS equal to or greater than 2 midnights due to - Age 4 and older with one or more of the following:  - Obesity  - Expected need for hospital services (PT, OT, Nursing) required for safe  discharge  - Anticipated need for postoperative skilled nursing care or inpatient rehab  - Active co-morbidities: Hx breast cancer OR   - Unanticipated findings during/Post Surgery: None  - Patient is a high risk of re-admission due to: None     Assessment/Plan:  End stage arthritis, right knee   The patient history, physical examination, clinical judgment of the provider and imaging studies are consistent with end stage degenerative joint disease of the right knee(s) and total knee arthroplasty is deemed medically necessary. The treatment options including medical management, injection therapy arthroscopy and arthroplasty were discussed at length. The risks and benefits of total knee arthroplasty were presented and reviewed. The risks due to aseptic loosening, infection, stiffness, patella tracking problems, thromboembolic complications and other imponderables were discussed. The patient acknowledged the explanation, agreed to proceed with the plan and consent was signed. Patient is being admitted for inpatient treatment for surgery, pain control, PT, OT, prophylactic antibiotics, VTE prophylaxis, progressive ambulation and ADL's and discharge planning. The patient is planning to be discharged home with outpatient  physical therapy.   Therapy Plans: outpatient therapy at [facility] Disposition: Home with husband, sister and daughter Planned DVT Prophylaxis: Xarelto 10 mg daily (hx of breast cancer) DME needed: None PCP: Dr. Maury Dus (medical clearance provided) TXA: IV Allergies: Codeine, tramadol (nausea/vomiting) Other: NO BP OR IV STICKS IN LEFT ARM  - Patient was instructed on what medications to stop prior to surgery. - Follow-up visit in 2 weeks with Dr. Wynelle Link - Begin physical therapy following surgery - Pre-operative lab work as pre-surgical testing - Prescriptions will be provided in hospital at time of discharge  Theresa Duty, PA-C Orthopedic Surgery [facility] [place]

## 2018-02-23 DIAGNOSIS — K635 Polyp of colon: Secondary | ICD-10-CM | POA: Diagnosis not present

## 2018-02-23 DIAGNOSIS — Z1211 Encounter for screening for malignant neoplasm of colon: Secondary | ICD-10-CM | POA: Diagnosis not present

## 2018-03-10 ENCOUNTER — Encounter (HOSPITAL_COMMUNITY): Payer: Self-pay

## 2018-03-10 NOTE — Pre-Procedure Instructions (Addendum)
The following are in the patient's hardback chart: Surgical clearance Dr. Alyson Ingles 12/27/2017

## 2018-03-10 NOTE — Patient Instructions (Addendum)
Your procedure is scheduled on: Monday, Sept. 30, 2019   Surgery Time:  10:30AM-11:20AM   Report to [facility]  Entrance    Report to admitting at 8:00 AM   Call this number if you have problems the morning of surgery [phone]   Do not eat food or drink liquids :After Midnight.   Brush your teeth the morning of surgery.   Do NOT smoke after Midnight   Take these medicines the morning of surgery with A SIP OF WATER:  Amlodipine, Levothyroxine  DO NOT TAKE ANY DIABETIC MEDICATIONS DAY OF YOUR SURGERY                               You may not have any metal on your body including hair pins, jewelry, and body piercings             Do not wear make-up, lotions, powders, perfumes/cologne, or deodorant             Do not wear nail polish.  Do not shave  48 hours prior to surgery.                Do not bring valuables to the hospital. [place].   Contacts, dentures or bridgework may not be worn into surgery.   Leave suitcase in the car. After surgery it may be brought to your room.    Special Instructions: Bring a copy of your healthcare power of attorney and living will documents         the day of surgery if you haven't scanned them in before.              Please read over the following fact sheets you were given:  [facility] - Preparing for Surgery Before surgery, you can play an important role.  Because skin is not sterile, your skin needs to be as free of germs as possible.  You can reduce the number of germs on your skin by washing with CHG (chlorahexidine gluconate) soap before surgery.  CHG is an antiseptic cleaner which kills germs and bonds with the skin to continue killing germs even after washing. Please DO NOT use if you have an allergy to CHG or antibacterial soaps.  If your skin becomes reddened/irritated stop using the CHG and inform your nurse when you arrive at Short Stay. Do not shave  (including legs and underarms) for at least 48 hours prior to the first CHG shower.  You may shave your face/neck.  Please follow these instructions carefully:  1.  Shower with CHG Soap the night before surgery and the  morning of surgery.  2.  If you choose to wash your hair, wash your hair first as usual with your normal  shampoo.  3.  After you shampoo, rinse your hair and body thoroughly to remove the shampoo.                             4.  Use CHG as you would any other liquid soap.  You can apply chg directly to the skin and wash.  Gently with a scrungie or clean washcloth.  5.  Apply the CHG Soap to your body ONLY FROM THE NECK DOWN.   Do  not use on face/ open                           Wound or open sores. Avoid contact with eyes, ears mouth and   genitals (private parts).                       Wash face,  Genitals (private parts) with your normal soap.             6.  Wash thoroughly, paying special attention to the area where your    surgery  will be performed.  7.  Thoroughly rinse your body with warm water from the neck down.  8.  DO NOT shower/wash with your normal soap after using and rinsing off the CHG Soap.                9.  Pat yourself dry with a clean towel.            10.  Wear clean pajamas.            11.  Place clean sheets on your bed the night of your first shower and do not  sleep with pets. Day of Surgery : Do not apply any lotions/deodorants the morning of surgery.  Please wear clean clothes to the hospital/surgery center.  FAILURE TO FOLLOW THESE INSTRUCTIONS MAY RESULT IN THE CANCELLATION OF YOUR SURGERY  PATIENT SIGNATURE_________________________________  NURSE SIGNATURE__________________________________  ________________________________________________________________________   Monica [name]  An incentive spirometer is a tool that can help keep your lungs clear and active. This tool measures how well you are filling your lungs with each breath.  Taking long deep breaths may help reverse or decrease the chance of developing breathing (pulmonary) problems (especially infection) following:  A long period of time when you are unable to move or be active. BEFORE THE PROCEDURE   If the spirometer includes an indicator to show your best effort, your nurse or respiratory therapist will set it to a desired goal.  If possible, sit up straight or lean slightly forward. Try not to slouch.  Hold the incentive spirometer in an upright position. INSTRUCTIONS FOR USE  1. Sit on the edge of your bed if possible, or sit up as far as you can in bed or on a chair. 2. Hold the incentive spirometer in an upright position. 3. Breathe out normally. 4. Place the mouthpiece in your mouth and seal your lips tightly around it. 5. Breathe in slowly and as deeply as possible, raising the piston or the ball toward the top of the column. 6. Hold your breath for 3-5 seconds or for as long as possible. Allow the piston or ball to fall to the bottom of the column. 7. Remove the mouthpiece from your mouth and breathe out normally. 8. Rest for a few seconds and repeat Steps 1 through 7 at least 10 times every 1-2 hours when you are awake. Take your time and take a few normal breaths between deep breaths. 9. The spirometer may include an indicator to show your best effort. Use the indicator as a goal to work toward during each repetition. 10. After each set of 10 deep breaths, practice coughing to be sure your lungs are clear. If you have an incision (the cut made at the time of surgery), support your incision when coughing by placing a pillow or rolled up towels firmly against it. Once you are able  to get out of bed, walk around indoors and cough well. You may stop using the incentive spirometer when instructed by your caregiver.  RISKS AND COMPLICATIONS  Take your time so you do not get dizzy or light-headed.  If you are in pain, you may need to take or ask for pain  medication before doing incentive spirometry. It is harder to take a deep breath if you are having pain. AFTER USE  Rest and breathe slowly and easily.  It can be helpful to keep track of a log of your progress. Your caregiver can provide you with a simple table to help with this. If you are using the spirometer at home, follow these instructions: [place] IF:   You are having difficultly using the spirometer.  You have trouble using the spirometer as often as instructed.  Your pain medication is not giving enough relief while using the spirometer.  You develop fever of 100.5 F (38.1 C) or higher. SEEK IMMEDIATE MEDICAL CARE IF:   You cough up bloody sputum that had not been present before.  You develop fever of 102 F (38.9 C) or greater.  You develop worsening pain at or near the incision site. MAKE SURE YOU:   Understand these instructions.  Will watch your condition.  Will get help right away if you are not doing well or get worse. Document Released: 10/19/2006 Document Revised: 08/31/2011 Document Reviewed: 12/20/2006 ExitCare Patient Information 2014 Exit[employer], [place].   ________________________________________________________________________  WHAT IS A BLOOD TRANSFUSION? Blood Transfusion Information  A transfusion is the replacement of blood or some of its parts. Blood is made up of multiple cells which provide different functions.  Red blood cells carry oxygen and are used for blood loss replacement.  White blood cells fight against infection.  Platelets control bleeding.  Plasma helps clot blood.  Other blood products are available for specialized needs, such as hemophilia or other clotting disorders. BEFORE THE TRANSFUSION  Who gives blood for transfusions?   Healthy volunteers who are fully evaluated to make sure their blood is safe. This is blood bank blood. Transfusion therapy is the safest it has ever been in the practice of medicine.  Before blood is taken from a donor, a complete history is taken to make sure that person has no history of diseases nor engages in risky social behavior (examples are intravenous drug use or sexual activity with multiple partners). The donor's travel history is screened to minimize risk of transmitting infections, such as malaria. The donated blood is tested for signs of infectious diseases, such as HIV and hepatitis. The blood is then tested to be sure it is compatible with you in order to minimize the chance of a transfusion reaction. If you or a relative donates blood, this is often done in anticipation of surgery and is not appropriate for emergency situations. It takes many days to process the donated blood. RISKS AND COMPLICATIONS Although transfusion therapy is very safe and saves many lives, the main dangers of transfusion include:   Getting an infectious disease.  Developing a transfusion reaction. This is an allergic reaction to something in the blood you were given. Every precaution is taken to prevent this. The decision to have a blood transfusion has been considered carefully by your caregiver before blood is given. Blood is not given unless the benefits outweigh the risks. AFTER THE TRANSFUSION  Right after receiving a blood transfusion, you will usually feel much better and more energetic. This is especially true  if your red blood cells have gotten low (anemic). The transfusion raises the level of the red blood cells which carry oxygen, and this usually causes an energy increase.  The nurse administering the transfusion will monitor you carefully for complications. HOME CARE INSTRUCTIONS  No special instructions are needed after a transfusion. You may find your energy is better. Speak with your caregiver about any limitations on activity for underlying diseases you may have. SEEK MEDICAL CARE IF:   Your condition is not improving after your transfusion.  You develop redness or  irritation at the intravenous (IV) site. SEEK IMMEDIATE MEDICAL CARE IF:  Any of the following symptoms occur over the next 12 hours:  Shaking chills.  You have a temperature by mouth above 102 F (38.9 C), not controlled by medicine.  Chest, back, or muscle pain.  People around you feel you are not acting correctly or are confused.  Shortness of breath or difficulty breathing.  Dizziness and fainting.  You get a rash or develop hives.  You have a decrease in urine output.  Your urine turns a dark color or changes to pink, red, or brown. Any of the following symptoms occur over the next 10 days:  You have a temperature by mouth above 102 F (38.9 C), not controlled by medicine.  Shortness of breath.  Weakness after normal activity.  The white part of the eye turns yellow (jaundice).  You have a decrease in the amount of urine or are urinating less often.  Your urine turns a dark color or changes to pink, red, or brown. Document Released: 06/05/2000 Document Revised: 08/31/2011 Document Reviewed: 01/23/2008 [facility] Patient Information 2014 [place], [place].  _______________________________________________________________________

## 2018-03-14 ENCOUNTER — Other Ambulatory Visit: Payer: Self-pay

## 2018-03-14 ENCOUNTER — Encounter (HOSPITAL_COMMUNITY): Payer: Self-pay

## 2018-03-14 ENCOUNTER — Encounter (HOSPITAL_COMMUNITY)
Admission: RE | Admit: 2018-03-14 | Discharge: 2018-03-14 | Disposition: A | Discharge status: Home / Self Care | Payer: Medicare HMO | Source: Ambulatory Visit | Attending: Orthopedic Surgery | Admitting: Orthopedic Surgery

## 2018-03-14 DIAGNOSIS — Z79891 Long term (current) use of opiate analgesic: Secondary | ICD-10-CM | POA: Diagnosis not present

## 2018-03-14 DIAGNOSIS — Z853 Personal history of malignant neoplasm of breast: Secondary | ICD-10-CM | POA: Insufficient documentation

## 2018-03-14 DIAGNOSIS — Z01812 Encounter for preprocedural laboratory examination: Secondary | ICD-10-CM | POA: Insufficient documentation

## 2018-03-14 DIAGNOSIS — Z79899 Other long term (current) drug therapy: Secondary | ICD-10-CM | POA: Insufficient documentation

## 2018-03-14 DIAGNOSIS — M1711 Unilateral primary osteoarthritis, right knee: Secondary | ICD-10-CM | POA: Diagnosis not present

## 2018-03-14 DIAGNOSIS — Z79890 Hormone replacement therapy (postmenopausal): Secondary | ICD-10-CM | POA: Diagnosis not present

## 2018-03-14 DIAGNOSIS — G4700 Insomnia, unspecified: Secondary | ICD-10-CM | POA: Insufficient documentation

## 2018-03-14 HISTORY — DX: Personal history of other diseases of the circulatory system: Z86.79

## 2018-03-14 HISTORY — DX: Spinal stenosis, site unspecified: M48.00

## 2018-03-14 HISTORY — DX: Unspecified osteoarthritis, unspecified site: M19.90

## 2018-03-14 HISTORY — DX: Cardiomegaly: I51.7

## 2018-03-14 HISTORY — DX: Other intervertebral disc degeneration, lumbosacral region: M51.37

## 2018-03-14 HISTORY — DX: Atherosclerosis of aorta: I70.0

## 2018-03-14 HISTORY — DX: Heart disease, unspecified: I51.9

## 2018-03-14 HISTORY — DX: Other intervertebral disc degeneration, lumbosacral region without mention of lumbar back pain or lower extremity pain: M51.379

## 2018-03-14 LAB — COMPREHENSIVE METABOLIC PANEL
ALT: 21 U/L (ref 0–44)
ANION GAP: 7 (ref 5–15)
AST: 24 U/L (ref 15–41)
Albumin: 3.5 g/dL (ref 3.5–5.0)
Alkaline Phosphatase: 84 U/L (ref 38–126)
BUN: 20 mg/dL (ref 8–23)
CHLORIDE: 107 mmol/L (ref 98–111)
CO2: 31 mmol/L (ref 22–32)
Calcium: 9.8 mg/dL (ref 8.9–10.3)
Creatinine, Ser: 0.93 mg/dL (ref 0.44–1.00)
GFR calc Af Amer: >60 mL/min (ref >60)
Glucose, Bld: 120 mg/dL — ABNORMAL HIGH (ref 70–99)
POTASSIUM: 4.2 mmol/L (ref 3.5–5.1)
SODIUM: 145 mmol/L (ref 135–145)
Total Bilirubin: 0.6 mg/dL (ref 0.3–1.2)
Total Protein: 7.5 g/dL (ref 6.5–8.1)

## 2018-03-14 LAB — CBC
HCT: 36.8 % (ref 36.0–46.0)
Hemoglobin: 11.9 g/dL — ABNORMAL LOW (ref 12.0–15.0)
MCH: 29 pg (ref 26.0–34.0)
MCHC: 32.3 g/dL (ref 30.0–36.0)
MCV: 89.8 fL (ref 78.0–100.0)
PLATELETS: 266 10*3/uL (ref 150–400)
RBC: 4.1 MIL/uL (ref 3.87–5.11)
RDW: 14.5 % (ref 11.5–15.5)
WBC: 4.6 10*3/uL (ref 4.0–10.5)

## 2018-03-14 LAB — APTT: aPTT: 31 seconds (ref 24–36)

## 2018-03-14 LAB — PROTIME-INR
INR: 0.93
PROTHROMBIN TIME: 12.4 s (ref 11.4–15.2)

## 2018-03-14 LAB — SURGICAL PCR SCREEN
MRSA, PCR: NEGATIVE
STAPHYLOCOCCUS AUREUS: NEGATIVE

## 2018-03-20 MED ORDER — BUPIVACAINE LIPOSOME 1.3 % IJ SUSP
20.0000 mL | Freq: Once | INTRAMUSCULAR | Status: DC
  Filled 2018-03-20: qty 20

## 2018-03-21 ENCOUNTER — Inpatient Hospital Stay (HOSPITAL_COMMUNITY): Payer: Medicare HMO | Admitting: Anesthesiology

## 2018-03-21 ENCOUNTER — Encounter (HOSPITAL_COMMUNITY): Payer: Self-pay | Admitting: General Practice

## 2018-03-21 ENCOUNTER — Inpatient Hospital Stay (HOSPITAL_COMMUNITY)
Admission: RE | Admit: 2018-03-21 | Discharge: 2018-03-23 | DRG: 470 | Disposition: A | Discharge status: Home / Self Care | Payer: Medicare HMO | Attending: Orthopedic Surgery | Admitting: Orthopedic Surgery

## 2018-03-21 ENCOUNTER — Encounter (HOSPITAL_COMMUNITY)
Admission: RE | Disposition: A | Discharge status: Home / Self Care | Payer: Self-pay | Source: Home / Self Care | Attending: Orthopedic Surgery

## 2018-03-21 ENCOUNTER — Other Ambulatory Visit: Payer: Self-pay

## 2018-03-21 DIAGNOSIS — Z90710 Acquired absence of both cervix and uterus: Secondary | ICD-10-CM | POA: Diagnosis not present

## 2018-03-21 DIAGNOSIS — E0590 Thyrotoxicosis, unspecified without thyrotoxic crisis or storm: Secondary | ICD-10-CM | POA: Diagnosis not present

## 2018-03-21 DIAGNOSIS — M1711 Unilateral primary osteoarthritis, right knee: Principal | ICD-10-CM | POA: Diagnosis present

## 2018-03-21 DIAGNOSIS — Z6835 Body mass index (BMI) 35.0-35.9, adult: Secondary | ICD-10-CM

## 2018-03-21 DIAGNOSIS — G8918 Other acute postprocedural pain: Secondary | ICD-10-CM | POA: Diagnosis not present

## 2018-03-21 DIAGNOSIS — E669 Obesity, unspecified: Secondary | ICD-10-CM | POA: Diagnosis present

## 2018-03-21 DIAGNOSIS — Z79811 Long term (current) use of aromatase inhibitors: Secondary | ICD-10-CM

## 2018-03-21 DIAGNOSIS — C50211 Malignant neoplasm of upper-inner quadrant of right female breast: Secondary | ICD-10-CM | POA: Diagnosis not present

## 2018-03-21 DIAGNOSIS — Z9221 Personal history of antineoplastic chemotherapy: Secondary | ICD-10-CM

## 2018-03-21 DIAGNOSIS — M1710 Unilateral primary osteoarthritis, unspecified knee: Secondary | ICD-10-CM | POA: Diagnosis present

## 2018-03-21 DIAGNOSIS — Z79899 Other long term (current) drug therapy: Secondary | ICD-10-CM

## 2018-03-21 DIAGNOSIS — I119 Hypertensive heart disease without heart failure: Secondary | ICD-10-CM | POA: Diagnosis not present

## 2018-03-21 DIAGNOSIS — Z923 Personal history of irradiation: Secondary | ICD-10-CM

## 2018-03-21 DIAGNOSIS — Z91048 Other nonmedicinal substance allergy status: Secondary | ICD-10-CM | POA: Diagnosis not present

## 2018-03-21 DIAGNOSIS — Z79890 Hormone replacement therapy (postmenopausal): Secondary | ICD-10-CM

## 2018-03-21 DIAGNOSIS — M25761 Osteophyte, right knee: Secondary | ICD-10-CM | POA: Diagnosis not present

## 2018-03-21 DIAGNOSIS — Z7901 Long term (current) use of anticoagulants: Secondary | ICD-10-CM

## 2018-03-21 DIAGNOSIS — Z885 Allergy status to narcotic agent status: Secondary | ICD-10-CM | POA: Diagnosis not present

## 2018-03-21 DIAGNOSIS — Z96652 Presence of left artificial knee joint: Secondary | ICD-10-CM | POA: Diagnosis not present

## 2018-03-21 DIAGNOSIS — Z170 Estrogen receptor positive status [ER+]: Secondary | ICD-10-CM | POA: Diagnosis not present

## 2018-03-21 DIAGNOSIS — M179 Osteoarthritis of knee, unspecified: Secondary | ICD-10-CM | POA: Diagnosis present

## 2018-03-21 DIAGNOSIS — M5137 Other intervertebral disc degeneration, lumbosacral region: Secondary | ICD-10-CM | POA: Diagnosis present

## 2018-03-21 DIAGNOSIS — E039 Hypothyroidism, unspecified: Secondary | ICD-10-CM | POA: Diagnosis not present

## 2018-03-21 DIAGNOSIS — I700 Atherosclerosis of aorta: Secondary | ICD-10-CM | POA: Diagnosis not present

## 2018-03-21 DIAGNOSIS — I10 Essential (primary) hypertension: Secondary | ICD-10-CM | POA: Diagnosis not present

## 2018-03-21 DIAGNOSIS — Z853 Personal history of malignant neoplasm of breast: Secondary | ICD-10-CM | POA: Diagnosis not present

## 2018-03-21 HISTORY — PX: TOTAL KNEE ARTHROPLASTY: SHX125

## 2018-03-21 LAB — TYPE AND SCREEN
ABO/RH(D): B POS
ANTIBODY SCREEN: NEGATIVE

## 2018-03-21 SURGERY — ARTHROPLASTY, KNEE, TOTAL
Anesthesia: Monitor Anesthesia Care | Site: Knee | Laterality: Right

## 2018-03-21 MED ORDER — OXYCODONE HCL 5 MG PO TABS
10.0000 mg | ORAL_TABLET | ORAL | Status: DC | PRN
  Administered 2018-03-21 – 2018-03-23 (×8): 15 mg via ORAL
  Filled 2018-03-21 (×7): qty 3

## 2018-03-21 MED ORDER — HYDROCHLOROTHIAZIDE 25 MG PO TABS
25.0000 mg | ORAL_TABLET | Freq: Every day | ORAL | Status: DC
  Administered 2018-03-22 – 2018-03-23 (×2): 25 mg via ORAL
  Filled 2018-03-21 (×2): qty 1

## 2018-03-21 MED ORDER — FENTANYL CITRATE (PF) 100 MCG/2ML IJ SOLN: 25.0000 ug | INTRAMUSCULAR | Status: DC | PRN

## 2018-03-21 MED ORDER — CEFAZOLIN SODIUM-DEXTROSE 2-4 GM/100ML-% IV SOLN
2.0000 g | Freq: Four times a day (QID) | INTRAVENOUS | Status: AC
  Administered 2018-03-21 (×2): 2 g via INTRAVENOUS
  Filled 2018-03-21 (×2): qty 100

## 2018-03-21 MED ORDER — LACTATED RINGERS IV SOLN
INTRAVENOUS | Status: DC
  Administered 2018-03-21: 09:00:00 via INTRAVENOUS

## 2018-03-21 MED ORDER — SODIUM CHLORIDE 0.9 % IR SOLN
Status: DC | PRN
  Administered 2018-03-21: 1000 mL

## 2018-03-21 MED ORDER — DOCUSATE SODIUM 100 MG PO CAPS
100.0000 mg | ORAL_CAPSULE | Freq: Two times a day (BID) | ORAL | Status: DC
  Administered 2018-03-21 – 2018-03-23 (×4): 100 mg via ORAL
  Filled 2018-03-21 (×4): qty 1

## 2018-03-21 MED ORDER — SODIUM CHLORIDE 0.9 % IJ SOLN
INTRAMUSCULAR | Status: AC
  Filled 2018-03-21: qty 10

## 2018-03-21 MED ORDER — GABAPENTIN 300 MG PO CAPS
300.0000 mg | ORAL_CAPSULE | Freq: Once | ORAL | Status: AC
  Administered 2018-03-21: 300 mg via ORAL
  Filled 2018-03-21: qty 1

## 2018-03-21 MED ORDER — METHOCARBAMOL 500 MG PO TABS
500.0000 mg | ORAL_TABLET | Freq: Four times a day (QID) | ORAL | Status: DC | PRN
  Administered 2018-03-21 – 2018-03-22 (×4): 500 mg via ORAL
  Filled 2018-03-21 (×4): qty 1

## 2018-03-21 MED ORDER — ROPIVACAINE HCL 7.5 MG/ML IJ SOLN
INTRAMUSCULAR | Status: DC | PRN
  Administered 2018-03-21: 20 mL via PERINEURAL

## 2018-03-21 MED ORDER — MIDAZOLAM HCL 5 MG/5ML IJ SOLN
INTRAMUSCULAR | Status: DC | PRN
  Administered 2018-03-21: 1 mg via INTRAVENOUS

## 2018-03-21 MED ORDER — PHENOL 1.4 % MT LIQD: 1.0000 | OROMUCOSAL | Status: DC | PRN

## 2018-03-21 MED ORDER — ONDANSETRON HCL 4 MG/2ML IJ SOLN
INTRAMUSCULAR | Status: DC | PRN
  Administered 2018-03-21: 4 mg via INTRAVENOUS

## 2018-03-21 MED ORDER — ONDANSETRON HCL 4 MG/2ML IJ SOLN
4.0000 mg | Freq: Four times a day (QID) | INTRAMUSCULAR | Status: DC | PRN
  Administered 2018-03-22: 4 mg via INTRAVENOUS
  Filled 2018-03-21: qty 2

## 2018-03-21 MED ORDER — ACETAMINOPHEN 10 MG/ML IV SOLN
1000.0000 mg | Freq: Four times a day (QID) | INTRAVENOUS | Status: DC
  Administered 2018-03-21: 1000 mg via INTRAVENOUS
  Filled 2018-03-21: qty 100

## 2018-03-21 MED ORDER — DIPHENHYDRAMINE HCL 12.5 MG/5ML PO ELIX: 12.5000 mg | ORAL_SOLUTION | ORAL | Status: DC | PRN

## 2018-03-21 MED ORDER — MENTHOL 3 MG MT LOZG: 1.0000 | LOZENGE | OROMUCOSAL | Status: DC | PRN

## 2018-03-21 MED ORDER — POTASSIUM CHLORIDE CRYS ER 20 MEQ PO TBCR
20.0000 meq | EXTENDED_RELEASE_TABLET | Freq: Every day | ORAL | Status: DC
  Administered 2018-03-22 – 2018-03-23 (×2): 20 meq via ORAL
  Filled 2018-03-21 (×2): qty 1

## 2018-03-21 MED ORDER — BISACODYL 10 MG RE SUPP: 10.0000 mg | Freq: Every day | RECTAL | Status: DC | PRN

## 2018-03-21 MED ORDER — CLONIDINE HCL (ANALGESIA) 100 MCG/ML EP SOLN
EPIDURAL | Status: DC | PRN
  Administered 2018-03-21: 50 ug

## 2018-03-21 MED ORDER — ANASTROZOLE 1 MG PO TABS
1.0000 mg | ORAL_TABLET | Freq: Every day | ORAL | Status: DC
  Administered 2018-03-22: 1 mg via ORAL
  Filled 2018-03-21 (×2): qty 1

## 2018-03-21 MED ORDER — OXYCODONE HCL 5 MG PO TABS
5.0000 mg | ORAL_TABLET | ORAL | Status: DC | PRN
  Administered 2018-03-21 (×2): 10 mg via ORAL
  Filled 2018-03-21 (×2): qty 2
  Filled 2018-03-21: qty 1
  Filled 2018-03-21: qty 2

## 2018-03-21 MED ORDER — ONDANSETRON HCL 4 MG/2ML IJ SOLN: 4.0000 mg | Freq: Once | INTRAMUSCULAR | Status: DC | PRN

## 2018-03-21 MED ORDER — PROPOFOL 500 MG/50ML IV EMUL
INTRAVENOUS | Status: DC | PRN
  Administered 2018-03-21: 50 ug/kg/min via INTRAVENOUS

## 2018-03-21 MED ORDER — FENTANYL CITRATE (PF) 100 MCG/2ML IJ SOLN
50.0000 ug | INTRAMUSCULAR | Status: DC
  Administered 2018-03-21: 50 ug via INTRAVENOUS
  Filled 2018-03-21: qty 2

## 2018-03-21 MED ORDER — TRANEXAMIC ACID 1000 MG/10ML IV SOLN
1000.0000 mg | INTRAVENOUS | Status: AC
  Administered 2018-03-21: 1000 mg via INTRAVENOUS
  Filled 2018-03-21: qty 10

## 2018-03-21 MED ORDER — SODIUM CHLORIDE 0.9 % IJ SOLN
INTRAMUSCULAR | Status: AC
  Filled 2018-03-21: qty 50

## 2018-03-21 MED ORDER — SODIUM CHLORIDE 0.9 % IV SOLN
INTRAVENOUS | Status: DC
  Administered 2018-03-21 – 2018-03-22 (×2): via INTRAVENOUS

## 2018-03-21 MED ORDER — POLYETHYLENE GLYCOL 3350 17 G PO PACK: 17.0000 g | PACK | Freq: Every day | ORAL | Status: DC | PRN

## 2018-03-21 MED ORDER — MIDAZOLAM HCL 2 MG/2ML IJ SOLN
1.0000 mg | INTRAMUSCULAR | Status: DC
  Filled 2018-03-21: qty 2

## 2018-03-21 MED ORDER — SODIUM CHLORIDE 0.9 % IJ SOLN
INTRAMUSCULAR | Status: DC | PRN
  Administered 2018-03-21: 60 mL via INTRAVENOUS

## 2018-03-21 MED ORDER — HYDROMORPHONE HCL 1 MG/ML IJ SOLN: 0.5000 mg | INTRAMUSCULAR | Status: DC | PRN

## 2018-03-21 MED ORDER — MIDAZOLAM HCL 2 MG/2ML IJ SOLN
INTRAMUSCULAR | Status: AC
  Filled 2018-03-21: qty 2

## 2018-03-21 MED ORDER — ACETAMINOPHEN 500 MG PO TABS
1000.0000 mg | ORAL_TABLET | Freq: Four times a day (QID) | ORAL | Status: AC
  Administered 2018-03-21 – 2018-03-22 (×4): 1000 mg via ORAL
  Filled 2018-03-21 (×4): qty 2

## 2018-03-21 MED ORDER — IRBESARTAN 150 MG PO TABS
300.0000 mg | ORAL_TABLET | Freq: Every day | ORAL | Status: DC
  Administered 2018-03-22 – 2018-03-23 (×2): 300 mg via ORAL
  Filled 2018-03-21 (×2): qty 2

## 2018-03-21 MED ORDER — AMLODIPINE BESYLATE 5 MG PO TABS
5.0000 mg | ORAL_TABLET | Freq: Every day | ORAL | Status: DC
  Administered 2018-03-22 – 2018-03-23 (×2): 5 mg via ORAL
  Filled 2018-03-21 (×2): qty 1

## 2018-03-21 MED ORDER — ONDANSETRON HCL 4 MG/2ML IJ SOLN
INTRAMUSCULAR | Status: AC
  Filled 2018-03-21: qty 2

## 2018-03-21 MED ORDER — STERILE WATER FOR IRRIGATION IR SOLN
Status: DC | PRN
  Administered 2018-03-21: 1000 mL

## 2018-03-21 MED ORDER — GABAPENTIN 300 MG PO CAPS
300.0000 mg | ORAL_CAPSULE | Freq: Every day | ORAL | Status: DC
  Administered 2018-03-21 – 2018-03-22 (×2): 300 mg via ORAL
  Filled 2018-03-21 (×2): qty 1

## 2018-03-21 MED ORDER — DEXAMETHASONE SODIUM PHOSPHATE 10 MG/ML IJ SOLN
8.0000 mg | Freq: Once | INTRAMUSCULAR | Status: AC
  Administered 2018-03-21: 8 mg via INTRAVENOUS

## 2018-03-21 MED ORDER — PROPOFOL 10 MG/ML IV BOLUS
INTRAVENOUS | Status: AC
  Filled 2018-03-21: qty 40

## 2018-03-21 MED ORDER — BUPIVACAINE LIPOSOME 1.3 % IJ SUSP
INTRAMUSCULAR | Status: DC | PRN
  Administered 2018-03-21: 20 mL

## 2018-03-21 MED ORDER — LEVOTHYROXINE SODIUM 75 MCG PO TABS
75.0000 ug | ORAL_TABLET | Freq: Every day | ORAL | Status: DC
  Administered 2018-03-22 – 2018-03-23 (×2): 75 ug via ORAL
  Filled 2018-03-21 (×2): qty 1

## 2018-03-21 MED ORDER — METOCLOPRAMIDE HCL 5 MG/ML IJ SOLN: 5.0000 mg | Freq: Three times a day (TID) | INTRAMUSCULAR | Status: DC | PRN

## 2018-03-21 MED ORDER — DEXAMETHASONE SODIUM PHOSPHATE 10 MG/ML IJ SOLN
10.0000 mg | Freq: Once | INTRAMUSCULAR | Status: AC
  Administered 2018-03-22: 10 mg via INTRAVENOUS
  Filled 2018-03-21: qty 1

## 2018-03-21 MED ORDER — CLONIDINE HCL 0.1 MG/24HR TD PTWK: 0.1000 mg | MEDICATED_PATCH | TRANSDERMAL | Status: DC

## 2018-03-21 MED ORDER — ONDANSETRON HCL 4 MG PO TABS: 4.0000 mg | ORAL_TABLET | Freq: Four times a day (QID) | ORAL | Status: DC | PRN

## 2018-03-21 MED ORDER — METHOCARBAMOL 500 MG IVPB - SIMPLE MED
500.0000 mg | Freq: Four times a day (QID) | INTRAVENOUS | Status: DC | PRN
  Filled 2018-03-21: qty 50

## 2018-03-21 MED ORDER — FLEET ENEMA 7-19 GM/118ML RE ENEM: 1.0000 | ENEMA | Freq: Once | RECTAL | Status: DC | PRN

## 2018-03-21 MED ORDER — VALSARTAN-HYDROCHLOROTHIAZIDE 320-25 MG PO TABS: 1.0000 | ORAL_TABLET | Freq: Every day | ORAL | Status: DC

## 2018-03-21 MED ORDER — RIVAROXABAN 10 MG PO TABS
10.0000 mg | ORAL_TABLET | Freq: Every day | ORAL | Status: DC
  Administered 2018-03-22: 10 mg via ORAL
  Filled 2018-03-21: qty 1

## 2018-03-21 MED ORDER — CHLORHEXIDINE GLUCONATE 4 % EX LIQD: 60.0000 mL | Freq: Once | CUTANEOUS | Status: DC

## 2018-03-21 MED ORDER — CEFAZOLIN SODIUM-DEXTROSE 2-4 GM/100ML-% IV SOLN
2.0000 g | INTRAVENOUS | Status: AC
  Administered 2018-03-21: 2 g via INTRAVENOUS
  Filled 2018-03-21: qty 100

## 2018-03-21 MED ORDER — METOCLOPRAMIDE HCL 5 MG PO TABS: 5.0000 mg | ORAL_TABLET | Freq: Three times a day (TID) | ORAL | Status: DC | PRN

## 2018-03-21 SURGICAL SUPPLY — 57 items
ATTUNE MED DOME PAT 38 KNEE (Knees) ×2 IMPLANT
ATTUNE PSFEM RTSZ6 NARCEM KNEE (Femur) ×2 IMPLANT
ATTUNE PSRP INSR SZ6 7 KNEE (Insert) ×2 IMPLANT
BAG ZIPLOCK 12X15 (MISCELLANEOUS) ×2 IMPLANT
BANDAGE ACE 6X5 VEL STRL LF (GAUZE/BANDAGES/DRESSINGS) ×2 IMPLANT
BASE TIBIAL ROT PLAT SZ 5 KNEE (Knees) ×1 IMPLANT
BLADE SAG 18X100X1.27 (BLADE) ×2 IMPLANT
BLADE SAGITTAL 13X1.27X60 (BLADE) ×2 IMPLANT
BLADE SAW SGTL 11.0X1.19X90.0M (BLADE) IMPLANT
BOWL SMART MIX CTS (DISPOSABLE) ×2 IMPLANT
CEMENT HV SMART SET (Cement) ×4 IMPLANT
COVER SURGICAL LIGHT HANDLE (MISCELLANEOUS) ×2 IMPLANT
CUFF TOURN SGL QUICK 34 (TOURNIQUET CUFF) ×1
CUFF TRNQT CYL 34X4X40X1 (TOURNIQUET CUFF) ×1 IMPLANT
DECANTER SPIKE VIAL GLASS SM (MISCELLANEOUS) ×2 IMPLANT
DRAPE U-SHAPE 47X51 STRL (DRAPES) ×2 IMPLANT
DRSG ADAPTIC 3X8 NADH LF (GAUZE/BANDAGES/DRESSINGS) ×2 IMPLANT
DRSG PAD ABDOMINAL 8X10 ST (GAUZE/BANDAGES/DRESSINGS) ×2 IMPLANT
DURAPREP 26ML APPLICATOR (WOUND CARE) ×2 IMPLANT
ELECT REM PT RETURN 15FT ADLT (MISCELLANEOUS) ×2 IMPLANT
EVACUATOR 1/8 PVC DRAIN (DRAIN) ×2 IMPLANT
GAUZE SPONGE 4X4 12PLY STRL (GAUZE/BANDAGES/DRESSINGS) ×2 IMPLANT
GLOVE BIO SURGEON STRL SZ7 (GLOVE) ×2 IMPLANT
GLOVE BIO SURGEON STRL SZ8 (GLOVE) ×2 IMPLANT
GLOVE BIOGEL PI IND STRL 6.5 (GLOVE) ×1 IMPLANT
GLOVE BIOGEL PI IND STRL 7.0 (GLOVE) ×1 IMPLANT
GLOVE BIOGEL PI IND STRL 8 (GLOVE) ×1 IMPLANT
GLOVE BIOGEL PI INDICATOR 6.5 (GLOVE) ×1
GLOVE BIOGEL PI INDICATOR 7.0 (GLOVE) ×1
GLOVE BIOGEL PI INDICATOR 8 (GLOVE) ×1
GLOVE SURG SS PI 6.5 STRL IVOR (GLOVE) ×2 IMPLANT
GOWN STRL REUS W/TWL LRG LVL3 (GOWN DISPOSABLE) ×4 IMPLANT
GOWN STRL REUS W/TWL XL LVL3 (GOWN DISPOSABLE) ×2 IMPLANT
HANDPIECE INTERPULSE COAX TIP (DISPOSABLE) ×1
HOLDER FOLEY CATH W/STRAP (MISCELLANEOUS) IMPLANT
IMMOBILIZER KNEE 20 (SOFTGOODS) ×4 IMPLANT
IMMOBILIZER KNEE 20 THIGH 36 (SOFTGOODS) ×1 IMPLANT
MANIFOLD NEPTUNE II (INSTRUMENTS) ×2 IMPLANT
NS IRRIG 1000ML POUR BTL (IV SOLUTION) ×2 IMPLANT
PACK TOTAL KNEE CUSTOM (KITS) ×2 IMPLANT
PAD ABD 7.5X8 STRL (GAUZE/BANDAGES/DRESSINGS) ×2 IMPLANT
PADDING CAST COTTON 6X4 STRL (CAST SUPPLIES) ×2 IMPLANT
PIN STEINMAN FIXATION KNEE (PIN) ×2 IMPLANT
PIN THREADED HEADED SIGMA (PIN) ×2 IMPLANT
POSITIONER SURGICAL ARM (MISCELLANEOUS) ×2 IMPLANT
SET HNDPC FAN SPRY TIP SCT (DISPOSABLE) ×1 IMPLANT
STRIP CLOSURE SKIN 1/2X4 (GAUZE/BANDAGES/DRESSINGS) ×2 IMPLANT
SUT MNCRL AB 4-0 PS2 18 (SUTURE) ×2 IMPLANT
SUT STRATAFIX 0 PDS 27 VIOLET (SUTURE) ×2
SUT VIC AB 2-0 CT1 27 (SUTURE) ×3
SUT VIC AB 2-0 CT1 TAPERPNT 27 (SUTURE) ×3 IMPLANT
SUTURE STRATFX 0 PDS 27 VIOLET (SUTURE) ×1 IMPLANT
TIBIAL BASE ROT PLAT SZ 5 KNEE (Knees) ×2 IMPLANT
TRAY FOLEY MTR SLVR 16FR STAT (SET/KITS/TRAYS/PACK) ×2 IMPLANT
WATER STERILE IRR 1000ML POUR (IV SOLUTION) ×2 IMPLANT
WRAP KNEE MAXI GEL POST OP (GAUZE/BANDAGES/DRESSINGS) ×2 IMPLANT
YANKAUER SUCT BULB TIP 10FT TU (MISCELLANEOUS) ×2 IMPLANT

## 2018-03-21 NOTE — Evaluation (Signed)
Physical Therapy Evaluation Patient Details Name: Monica [name] MRN: [id number] DOB: [birth year] Today's Date: 03/21/2018   History of Present Illness  R TKA, L TKA 4/19  Clinical Impression  The patient tolerated ambulating x 135'. Plans OPPT. Pt admitted with above diagnosis. Pt currently with functional limitations due to the deficits listed below (see PT Problem List).  Pt will benefit from skilled PT to increase their independence and safety with mobility to allow discharge to the venue listed below.      Follow Up Recommendations Follow surgeon's recommendation for DC plan and follow-up therapies;Outpatient PT    Equipment Recommendations  None recommended by PT    Recommendations for Other Services       Precautions / Restrictions Precautions Precautions: Knee Required Braces or Orthoses: Knee Immobilizer - Right      Mobility  Bed Mobility Overal bed mobility: Needs Assistance Bed Mobility: Supine to Sit     Supine to sit: Min assist     General bed mobility comments: cues for technique, assist with right leg  Transfers Overall transfer level: Needs assistance Equipment used: Rolling walker (2 wheeled) Transfers: Sit to/from Stand Sit to Stand: Min assist         General transfer comment: cues for hand placement  Ambulation/Gait Ambulation/Gait assistance: Min assist Gait Distance (Feet): 135 Feet Assistive device: Rolling walker (2 wheeled) Gait Pattern/deviations: Step-through pattern     General Gait Details: gait smoothe, not antalgic  Stairs            Wheelchair Mobility    Modified Rankin (Stroke Patients Only)       Balance                                             Pertinent Vitals/Pain Pain Assessment: 0-10 Pain Score: 3  Pain Descriptors / Indicators: Discomfort Pain Intervention(s): Premedicated before session;Monitored during session    Home Living Family/patient expects to be discharged to::  Private residence Living Arrangements: Spouse/significant other;Other relatives Available Help at Discharge: Family Type of Home: House Home Access: Stairs to enter Entrance Stairs-Rails: Left Entrance Stairs-Number of Steps: 5 Home Layout: Two level;1/2 bath on main level Home Equipment: Walker - 2 wheels      Prior Function Level of Independence: Independent               Hand Dominance        Extremity/Trunk Assessment   Upper Extremity Assessment Upper Extremity Assessment: Overall WFL for tasks assessed    Lower Extremity Assessment Lower Extremity Assessment: RLE deficits/detail RLE Deficits / Details: + SLR       Communication      Cognition Arousal/Alertness: Awake/alert Behavior During Therapy: WFL for tasks assessed/performed Overall Cognitive Status: Within Functional Limits for tasks assessed                                        General Comments      Exercises     Assessment/Plan    PT Assessment Patient needs continued PT services  PT Problem List Decreased strength;Decreased range of motion;Decreased activity tolerance;Decreased mobility;Decreased knowledge of precautions;Decreased safety awareness;Pain       PT Treatment Interventions DME instruction;Therapeutic exercise;Gait training;Stair training;Functional mobility training;Therapeutic activities;Patient/family education    PT Goals (Current  goals can be found in the Care Plan section)  Acute Rehab PT Goals Patient Stated Goal: go home PT Goal Formulation: With patient/family Time For Goal Achievement: 03/25/18 Potential to Achieve Goals: Good    Frequency 7X/week   Barriers to discharge        Co-evaluation               AM-PAC PT "6 Clicks" Daily Activity  Outcome Measure Difficulty turning over in bed (including adjusting bedclothes, sheets and blankets)?: A Little Difficulty moving from lying on back to sitting on the side of the bed? : A  Little Difficulty sitting down on and standing up from a chair with arms (e.g., wheelchair, bedside commode, etc,.)?: A Little Help needed moving to and from a bed to chair (including a wheelchair)?: A Little Help needed walking in hospital room?: A Little Help needed climbing 3-5 steps with a railing? : A Lot 6 Click Score: 17    End of Session Equipment Utilized During Treatment: Right knee immobilizer Activity Tolerance: Patient tolerated treatment well Patient left: in chair;with call bell/phone within reach;with family/visitor present Nurse Communication: Mobility status PT Visit Diagnosis: Unsteadiness on feet (R26.81)    Time: 0102-7253 PT Time Calculation (min) (ACUTE ONLY): 22 min   Charges:   PT Evaluation $PT Eval Low Complexity: [place] Pager [phone] Office [phone]   [name] 03/21/2018, 5:55 PM

## 2018-03-21 NOTE — Interval H&P Note (Signed)
History and Physical Interval Note:  03/21/2018 8:22 AM  Monica [name]  has presented today for surgery, with the diagnosis of right knee osteoarthritis  The various methods of treatment have been discussed with the patient and family. After consideration of risks, benefits and other options for treatment, the patient has consented to  Procedure(s): RIGHT TOTAL KNEE ARTHROPLASTY (Right) as a surgical intervention .  The patient's history has been reviewed, patient examined, no change in status, stable for surgery.  I have reviewed the patient's chart and labs.  Questions were answered to the patient's satisfaction.     Pilar Plate Kariem Wolfson

## 2018-03-21 NOTE — Op Note (Signed)
OPERATIVE REPORT-TOTAL KNEE ARTHROPLASTY   Pre-operative diagnosis- Osteoarthritis  Right knee(s)  Post-operative diagnosis- Osteoarthritis Right knee(s)  Procedure-  Right  Total Knee Arthroplasty (Depuy Attune)  Surgeon- Dione Plover. Raisha Brabender, MD  Assistant- Ardeen Jourdain, PA-C   Anesthesia-  Adductor canal block and spinal  EBL-25 mL   Drains Hemovac  Tourniquet time-  Total Tourniquet Time Documented: Thigh (Right) - 41 minutes Total: Thigh (Right) - 41 minutes     Complications- None  Condition-PACU - hemodynamically stable.   Brief Clinical Note  Monica [name] is a 72 y.o. year old female with end stage OA of her right knee with progressively worsening pain and dysfunction. She has constant pain, with activity and at rest and significant functional deficits with difficulties even with ADLs. She has had extensive non-op management including analgesics, injections of cortisone, and home exercise program, but remains in significant pain with significant dysfunction.Radiographs show bone on bone arthritis bone on bone medial and patellofemoral. She presents now for right Total Knee Arthroplasty.    Procedure in detail---   The patient is brought into the operating room and positioned supine on the operating table. After successful administration of  Adductor canal block and spinal,   a tourniquet is placed high on the  Right thigh(s) and the lower extremity is prepped and draped in the usual sterile fashion. Time out is performed by the operating team and then the  Right lower extremity is wrapped in Esmarch, knee flexed and the tourniquet inflated to 300 mmHg.       A midline incision is made with a ten blade through the subcutaneous tissue to the level of the extensor mechanism. A fresh blade is used to make a medial parapatellar arthrotomy. Soft tissue over the proximal medial tibia is subperiosteally elevated to the joint line with a knife and into the semimembranosus  bursa with a Cobb elevator. Soft tissue over the proximal lateral tibia is elevated with attention being paid to avoiding the patellar tendon on the tibial tubercle. The patella is everted, knee flexed 90 degrees and the ACL and PCL are removed. Findings are bone on bone medial and patellofemoral with massive global osteophytes.        The drill is used to create a starting hole in the distal femur and the canal is thoroughly irrigated with sterile saline to remove the fatty contents. The 5 degree Right  valgus alignment guide is placed into the femoral canal and the distal femoral cutting block is pinned to remove 9 mm off the distal femur. Resection is made with an oscillating saw.      The tibia is subluxed forward and the menisci are removed. The extramedullary alignment guide is placed referencing proximally at the medial aspect of the tibial tubercle and distally along the second metatarsal axis and tibial crest. The block is pinned to remove 55mm off the more deficient medial  side. Resection is made with an oscillating saw. Size 5is the most appropriate size for the tibia and the proximal tibia is prepared with the modular drill and keel punch for that size.      The femoral sizing guide is placed and size 6 is most appropriate. Rotation is marked off the epicondylar axis and confirmed by creating a rectangular flexion gap at 90 degrees. The size 6 cutting block is pinned in this rotation and the anterior, posterior and chamfer cuts are made with the oscillating saw. The intercondylar block is then placed and that cut is  made.      Trial size 5 tibial component, trial size 6 narrow posterior stabilized femur and a 7  mm posterior stabilized rotating platform insert trial is placed. Full extension is achieved with excellent varus/valgus and anterior/posterior balance throughout full range of motion. The patella is everted and thickness measured to be 22  mm. Free hand resection is taken to 12 mm, a 38  template is placed, lug holes are drilled, trial patella is placed, and it tracks normally. Osteophytes are removed off the posterior femur with the trial in place. All trials are removed and the cut bone surfaces prepared with pulsatile lavage. Cement is mixed and once ready for implantation, the size 5 tibial implant, size  6 narrow posterior stabilized femoral component, and the size 38 patella are cemented in place and the patella is held with the clamp. The trial insert is placed and the knee held in full extension. The Exparel (20 ml mixed with 60 ml saline) is injected into the extensor mechanism, posterior capsule, medial and lateral gutters and subcutaneous tissues.  All extruded cement is removed and once the cement is hard the permanent 7 mm posterior stabilized rotating platform insert is placed into the tibial tray.      The wound is copiously irrigated with saline solution and the extensor mechanism closed over a hemovac drain with #1 V-loc suture. The tourniquet is released for a total tourniquet time of 41  minutes. Flexion against gravity is 140 degrees and the patella tracks normally. Subcutaneous tissue is closed with 2.0 vicryl and subcuticular with running 4.0 Monocryl. The incision is cleaned and dried and steri-strips and a bulky sterile dressing are applied. The limb is placed into a knee immobilizer and the patient is awakened and transported to recovery in stable condition.      Please note that a surgical assistant was a medical necessity for this procedure in order to perform it in a safe and expeditious manner. Surgical assistant was necessary to retract the ligaments and vital neurovascular structures to prevent injury to them and also necessary for proper positioning of the limb to allow for anatomic placement of the prosthesis.   Dione Plover Anne-Marie Genson, MD    03/21/2018, 11:39 AM

## 2018-03-21 NOTE — Progress Notes (Signed)
AssistedDr. Rob Fitzgerald with right, ultrasound guided, adductor canal block. Side rails up, monitors on throughout procedure. See vital signs in flow sheet. Tolerated Procedure well.  

## 2018-03-21 NOTE — Anesthesia Procedure Notes (Signed)
Anesthesia Regional Block: Adductor canal block   Pre-Anesthetic Checklist: ,, timeout performed, Correct Patient, Correct Site, Correct Laterality, Correct Procedure, Correct Position, site marked, Risks and benefits discussed,  Surgical consent,  Pre-op evaluation,  At surgeon's request and post-op pain management  Laterality: Right  Prep: chloraprep       Needles:  Injection technique: Single-shot  Needle Type: Echogenic Needle     Needle Length: 9cm  Needle Gauge: 21     Additional Needles:   Procedures:,,,, ultrasound used (permanent image in chart),,,,  Narrative:  Start time: 03/21/2018 9:40 AM End time: 03/21/2018 9:45 AM Injection made incrementally with aspirations every 5 mL.  Performed by: Personally  Anesthesiologist: Suzette Battiest, MD

## 2018-03-21 NOTE — Anesthesia Postprocedure Evaluation (Signed)
Anesthesia Post Note  Patient: Monica [name]  Procedure(s) Performed: RIGHT TOTAL KNEE ARTHROPLASTY (Right Knee)     Patient location during evaluation: PACU Anesthesia Type: MAC and Spinal Level of consciousness: awake and alert Pain management: pain level controlled Vital Signs Assessment: post-procedure vital signs reviewed and stable Respiratory status: spontaneous breathing and respiratory function stable Cardiovascular status: blood pressure returned to baseline and stable Postop Assessment: spinal receding Anesthetic complications: no    Last Vitals:  Vitals:   03/21/18 1346 03/21/18 1443  BP: (!) 136/93 134/74  Pulse: (!) 59 (!) 59  Resp: 14 14  Temp: 36.4 C 36.6 C  SpO2: 100% 100%    Last Pain:  Vitals:   03/21/18 1443  TempSrc: Oral  PainSc:                  Tiajuana Amass

## 2018-03-21 NOTE — Plan of Care (Signed)

## 2018-03-21 NOTE — Anesthesia Preprocedure Evaluation (Addendum)
Anesthesia Evaluation  Patient identified by MRN, date of birth, ID band Patient awake    Reviewed: Allergy & Precautions, NPO status , Patient's Chart, lab work & pertinent test results  History of Anesthesia Complications (+) PONV  Airway Mallampati: II  TM Distance: >3 FB Neck ROM: Full    Dental  (+) Dental Advisory Given   Pulmonary neg pulmonary ROS,    breath sounds clear to auscultation       Cardiovascular hypertension, Pt. on medications  Rhythm:Regular Rate:Normal     Neuro/Psych  Neuromuscular disease    GI/Hepatic negative GI ROS, Neg liver ROS,   Endo/Other  Hypothyroidism   Renal/GU negative Renal ROS     Musculoskeletal  (+) Arthritis ,   Abdominal   Peds  Hematology  (+) anemia ,   Anesthesia Other Findings   Reproductive/Obstetrics                             Lab Results  Component Value Date   WBC 4.6 03/14/2018   HGB 11.9 (L) 03/14/2018   HCT 36.8 03/14/2018   MCV 89.8 03/14/2018   PLT 266 03/14/2018   Lab Results  Component Value Date   CREATININE 0.93 03/14/2018   BUN 20 03/14/2018   NA 145 03/14/2018   K 4.2 03/14/2018   CL 107 03/14/2018   CO2 31 03/14/2018   Lab Results  Component Value Date   INR 0.93 03/14/2018   INR 0.94 09/14/2017    Anesthesia Physical Anesthesia Plan  ASA: II  Anesthesia Plan: MAC and Spinal   Post-op Pain Management:  Regional for Post-op pain   Induction: Intravenous  PONV Risk Score and Plan: 3 and Propofol infusion, Dexamethasone, Ondansetron and Treatment may vary due to age or medical condition  Airway Management Planned: Natural Airway and Simple Face Mask  Additional Equipment:   Intra-op Plan:   Post-operative Plan:   Informed Consent: I have reviewed the patients History and Physical, chart, labs and discussed the procedure including the risks, benefits and alternatives for the proposed anesthesia  with the patient or authorized representative who has indicated his/her understanding and acceptance.   Dental advisory given  Plan Discussed with: CRNA  Anesthesia Plan Comments:         Anesthesia Quick Evaluation

## 2018-03-21 NOTE — Anesthesia Procedure Notes (Signed)
Date/Time: 03/21/2018 10:20 AM Performed by: Glory Buff, CRNA Oxygen Delivery Method: Nasal cannula

## 2018-03-21 NOTE — Transfer of Care (Signed)
Immediate Anesthesia Transfer of Care Note  Patient: Monica [name]  Procedure(s) Performed: RIGHT TOTAL KNEE ARTHROPLASTY (Right Knee)  Patient Location: PACU  Anesthesia Type:Spinal  Level of Consciousness: awake, alert , oriented and patient cooperative  Airway & Oxygen Therapy: Patient Spontanous Breathing and Patient connected to face mask oxygen  Post-op Assessment: Report given to RN and Post -op Vital signs reviewed and stable  Post vital signs: Reviewed and stable  Last Vitals:  Vitals Value Taken Time  BP 127/74 03/21/2018 11:55 AM  Temp    Pulse 64 03/21/2018 11:57 AM  Resp 17 03/21/2018 11:57 AM  SpO2 96 % 03/21/2018 11:57 AM  Vitals shown include unvalidated device data.  Last Pain:  Vitals:   03/21/18 0950  TempSrc:   PainSc: 0-No pain         Complications: No apparent anesthesia complications

## 2018-03-21 NOTE — Anesthesia Procedure Notes (Signed)
Spinal  Patient location during procedure: OR Start time: 03/21/2018 10:28 AM End time: 03/21/2018 10:35 AM Staffing Anesthesiologist: Suzette Battiest, MD Performed: anesthesiologist  Preanesthetic Checklist Completed: patient identified, site marked, surgical consent, pre-op evaluation, timeout performed, IV checked, risks and benefits discussed and monitors and equipment checked Spinal Block Patient position: sitting Prep: site prepped and draped and DuraPrep Patient monitoring: blood pressure, continuous pulse ox and heart rate Approach: right paramedian Location: L3-4 Injection technique: single-shot Needle Needle type: Whitacre  Needle gauge: 22 G Needle length: 9 cm Additional Notes Unable to obtain CSF via midline approach at two separate levels. Switched to 22 whitacre and right paramedian approach yielded CSF that was free flowing. Unable to aspirate despite 90 degree turn of needle. Return of free flow and LA injected.

## 2018-03-21 NOTE — Discharge Instructions (Addendum)
° °Dr. Frank Aluisio °Total Joint Specialist °[facility] °[address] °[place], [place] [address] °[phone] ° °TOTAL KNEE REPLACEMENT POSTOPERATIVE DIRECTIONS ° °Knee Rehabilitation, Guidelines Following Surgery  °Results after knee surgery are often greatly improved when you follow the exercise, range of motion and muscle strengthening exercises prescribed by your doctor. Safety measures are also important to protect the knee from further injury. Any time any of these exercises cause you to have increased pain or swelling in your knee joint, decrease the amount until you are comfortable again and slowly increase them. If you have problems or questions, call your caregiver or physical therapist for advice.  ° °HOME CARE INSTRUCTIONS  °Remove items at home which could result in a fall. This includes throw rugs or furniture in walking pathways.  °· ICE to the affected knee every three hours for 30 minutes at a time and then as needed for pain and swelling.  Continue to use ice on the knee for pain and swelling from surgery. You may notice swelling that will progress down to the foot and ankle.  This is normal after surgery.  Elevate the leg when you are not up walking on it.   °· Continue to use the breathing machine which will help keep your temperature down.  It is common for your temperature to cycle up and down following surgery, especially at night when you are not up moving around and exerting yourself.  The breathing machine keeps your lungs expanded and your temperature down. °· Do not place pillow under knee, focus on keeping the knee straight while resting ° °DIET °You may resume your previous home diet once your are discharged from the hospital. ° °DRESSING / WOUND CARE / SHOWERING °You may change your dressing every day with sterile gauze.  Please use good hand washing techniques before changing the dressing.  Do not use any lotions or creams on the incision until instructed by your  surgeon. °You may start showering once you are discharged home but do not submerge the incision under water. Just pat the incision dry and apply a dry gauze dressing on daily. °Change the surgical dressing daily and reapply a dry dressing each time. ° °ACTIVITY °Walk with your walker as instructed. °Use walker as long as suggested by your caregivers. °Avoid periods of inactivity such as sitting longer than an hour when not asleep. This helps prevent blood clots.  °You may resume a sexual relationship in one month or when given the OK by your doctor.  °You may return to work once you are cleared by your doctor.  °Do not drive a car for 6 weeks or until released by you surgeon.  °Do not drive while taking narcotics. ° °WEIGHT BEARING °Weight bearing as tolerated with assist device (walker, cane, etc) as directed, use it as long as suggested by your surgeon or therapist, typically at least 4-6 weeks. ° °POSTOPERATIVE CONSTIPATION PROTOCOL °Constipation - defined medically as fewer than three stools per week and severe constipation as less than one stool per week. ° °One of the most common issues patients have following surgery is constipation.  Even if you have a regular bowel pattern at home, your normal regimen is likely to be disrupted due to multiple reasons following surgery.  Combination of anesthesia, postoperative narcotics, change in appetite and fluid intake all can affect your bowels.  In order to avoid complications following surgery, here are some recommendations in order to help you during your recovery period. ° °  Colace (docusate) - Pick up an over-the-counter form of Colace or another stool softener and take twice a day as long as you are requiring postoperative pain medications.  Take with a full glass of water daily.  If you experience loose stools or diarrhea, hold the colace until you stool forms back up.  If your symptoms do not get better within 1 week or if they get worse, check with your  doctor. ° °Dulcolax (bisacodyl) - Pick up over-the-counter and take as directed by the product packaging as needed to assist with the movement of your bowels.  Take with a full glass of water.  Use this product as needed if not relieved by Colace only.  ° °MiraLax (polyethylene glycol) - Pick up over-the-counter to have on hand.  MiraLax is a solution that will increase the amount of water in your bowels to assist with bowel movements.  Take as directed and can mix with a glass of water, juice, soda, coffee, or tea.  Take if you go more than two days without a movement. °Do not use MiraLax more than once per day. Call your doctor if you are still constipated or irregular after using this medication for 7 days in a row. ° °If you continue to have problems with postoperative constipation, please contact the office for further assistance and recommendations.  If you experience "the worst abdominal pain ever" or develop nausea or vomiting, please contact the office immediatly for further recommendations for treatment. ° °ITCHING ° If you experience itching with your medications, try taking only a single pain pill, or even half a pain pill at a time.  You can also use Benadryl over the counter for itching or also to help with sleep.  ° °TED HOSE STOCKINGS °Wear the elastic stockings on both legs for three weeks following surgery during the day but you may remove then at night for sleeping. ° °MEDICATIONS °See your medication summary on the “After Visit Summary” that the nursing staff will review with you prior to discharge.  You may have some home medications which will be placed on hold until you complete the course of blood thinner medication.  It is important for you to complete the blood thinner medication as prescribed by your surgeon.  Continue your approved medications as instructed at time of discharge. ° °PRECAUTIONS °If you experience chest pain or shortness of breath - call 911 immediately for transfer to the  hospital emergency department.  °If you develop a fever greater that 101 F, purulent drainage from wound, increased redness or drainage from wound, foul odor from the wound/dressing, or calf pain - CONTACT YOUR SURGEON.   °                                                °FOLLOW-UP APPOINTMENTS °Make sure you keep all of your appointments after your operation with your surgeon and caregivers. You should call the office at the above phone number and make an appointment for approximately two weeks after the date of your surgery or on the date instructed by your surgeon outlined in the "After Visit Summary". ° ° °RANGE OF MOTION AND STRENGTHENING EXERCISES  °Rehabilitation of the knee is important following a knee injury or an operation. After just a few days of immobilization, the muscles of the thigh which control the knee become weakened and   shrink (atrophy). Knee exercises are designed to build up the tone and strength of the thigh muscles and to improve knee motion. Often times heat used for twenty to thirty minutes before working out will loosen up your tissues and help with improving the range of motion but do not use heat for the first two weeks following surgery. These exercises can be done on a training (exercise) mat, on the floor, on a table or on a bed. Use what ever works the best and is most comfortable for you Knee exercises include:  Leg Lifts - While your knee is still immobilized in a splint or cast, you can do straight leg raises. Lift the leg to 60 degrees, hold for 3 sec, and slowly lower the leg. Repeat 10-20 times 2-3 times daily. Perform this exercise against resistance later as your knee gets better.  Quad and Hamstring Sets - Tighten up the muscle on the front of the thigh (Quad) and hold for 5-10 sec. Repeat this 10-20 times hourly. Hamstring sets are done by pushing the foot backward against an object and holding for 5-10 sec. Repeat as with quad sets.   Leg Slides: Lying on your back,  slowly slide your foot toward your buttocks, bending your knee up off the floor (only go as far as is comfortable). Then slowly slide your foot back down until your leg is flat on the floor again.  Angel Wings: Lying on your back spread your legs to the side as far apart as you can without causing discomfort.  A rehabilitation program following serious knee injuries can speed recovery and prevent re-injury in the future due to weakened muscles. Contact your doctor or a physical therapist for more information on knee rehabilitation.   IF YOU ARE TRANSFERRED TO A SKILLED REHAB FACILITY If the patient is transferred to a skilled rehab facility following release from the hospital, a list of the current medications will be sent to the facility for the patient to continue.  When discharged from the skilled rehab facility, please have the facility set up the patient's [place] prior to being released. Also, the skilled facility will be responsible for providing the patient with their medications at time of release from the facility to include their pain medication, the muscle relaxants, and their blood thinner medication. If the patient is still at the rehab facility at time of the two week follow up appointment, the skilled rehab facility will also need to assist the patient in arranging follow up appointment in our office and any transportation needs.  MAKE SURE YOU:  Understand these instructions.  Get help right away if you are not doing well or get worse.    Pick up stool softner and laxative for home use following surgery while on pain medications. Do not submerge incision under water. Please use good hand washing techniques while changing dressing each day. May shower starting three days after surgery. Please use a clean towel to pat the incision dry following showers. Continue to use ice for pain and swelling after surgery. Do not use any lotions or creams on the incision  until instructed by your surgeon.   Information on my medicine - ELIQUIS (apixaban)  This medication education was reviewed with me or my healthcare representative as part of my discharge preparation.  The pharmacist that spoke with me during my hospital stay was:  Rhona Leavens.  Why was Eliquis prescribed for you? Eliquis was prescribed for you to reduce the risk  of blood clots forming after orthopedic surgery.    What do You need to know about Eliquis? Take your Eliquis TWICE DAILY - one tablet in the morning and one tablet in the evening with or without food.  It would be best to take the dose about the same time each day.  If you have difficulty swallowing the tablet whole please discuss with your pharmacist how to take the medication safely.  Take Eliquis exactly as prescribed by your doctor and DO NOT stop taking Eliquis without talking to the doctor who prescribed the medication.  Stopping without other medication to take the place of Eliquis may increase your risk of developing a clot.  After discharge, you should have regular check-up appointments with your healthcare provider that is prescribing your Eliquis.  What do you do if you miss a dose? If a dose of ELIQUIS is not taken at the scheduled time, take it as soon as possible on the same day and twice-daily administration should be resumed.  The dose should not be doubled to make up for a missed dose.  Do not take more than one tablet of ELIQUIS at the same time.  Important Safety Information A possible side effect of Eliquis is bleeding. You should call your healthcare provider right away if you experience any of the following: ? Bleeding from an injury or your nose that does not stop. ? Unusual colored urine (red or dark brown) or unusual colored stools (red or black). ? Unusual bruising for unknown reasons. ? A serious fall or if you hit your head (even if there is no bleeding).  Some medicines may interact with  Eliquis and might increase your risk of bleeding or clotting while on Eliquis. To help avoid this, consult your healthcare provider or pharmacist prior to using any new prescription or non-prescription medications, including herbals, vitamins, non-steroidal anti-inflammatory drugs (NSAIDs) and supplements.  This website has more information on Eliquis (apixaban): http://[email or web address].com/eliquis/home

## 2018-03-22 ENCOUNTER — Encounter (HOSPITAL_COMMUNITY): Payer: Self-pay | Admitting: Orthopedic Surgery

## 2018-03-22 LAB — CBC
HEMATOCRIT: 31.2 % — AB (ref 36.0–46.0)
HEMOGLOBIN: 10.2 g/dL — AB (ref 12.0–15.0)
MCH: 28.9 pg (ref 26.0–34.0)
MCHC: 32.7 g/dL (ref 30.0–36.0)
MCV: 88.4 fL (ref 78.0–100.0)
Platelets: 329 10*3/uL (ref 150–400)
RBC: 3.53 MIL/uL — ABNORMAL LOW (ref 3.87–5.11)
RDW: 14.1 % (ref 11.5–15.5)
WBC: 8.4 10*3/uL (ref 4.0–10.5)

## 2018-03-22 LAB — BASIC METABOLIC PANEL
ANION GAP: 7 (ref 5–15)
BUN: 19 mg/dL (ref 8–23)
CHLORIDE: 106 mmol/L (ref 98–111)
CO2: 26 mmol/L (ref 22–32)
Calcium: 8.9 mg/dL (ref 8.9–10.3)
Creatinine, Ser: 0.91 mg/dL (ref 0.44–1.00)
GFR calc non Af Amer: >60 mL/min (ref >60)
Glucose, Bld: 164 mg/dL — ABNORMAL HIGH (ref 70–99)
POTASSIUM: 3.8 mmol/L (ref 3.5–5.1)
Sodium: 139 mmol/L (ref 135–145)

## 2018-03-22 NOTE — Progress Notes (Signed)
Physical Therapy Treatment Patient Details Name: Monica [name] MRN: [id number] DOB: 1945/09/10 Today's Date: 03/22/2018    History of Present Illness R TKA, L TKA 4/19    PT Comments    Pt progressing well with mobility, she ambulated 140' with RW and performed TKA exercises with min assist.    Follow Up Recommendations  Follow surgeon's recommendation for DC plan and follow-up therapies;Outpatient PT     Equipment Recommendations  None recommended by PT    Recommendations for Other Services       Precautions / Restrictions Precautions Precautions: Knee Required Braces or Orthoses: Knee Immobilizer - Right Restrictions Weight Bearing Restrictions: No Other Position/Activity Restrictions: WBAT    Mobility  Bed Mobility Overal bed mobility: Modified Independent Bed Mobility: Sit to Supine     Supine to sit: Modified independent (Device/Increase time);HOB elevated Sit to supine: Min guard   General bed mobility comments: min/guard for RLE  Transfers Overall transfer level: Needs assistance Equipment used: Rolling walker (2 wheeled) Transfers: Sit to/from Stand Sit to Stand: Supervision         General transfer comment: cues for hand placement  Ambulation/Gait Ambulation/Gait assistance: Min guard Gait Distance (Feet): 140 Feet Assistive device: Rolling walker (2 wheeled) Gait Pattern/deviations: Step-through pattern Gait velocity: decr   General Gait Details: steady no loss of balance, good sequencing, distance limited by pain   Stairs             Wheelchair Mobility    Modified Rankin (Stroke Patients Only)       Balance Overall balance assessment: Modified Independent                                          Cognition Arousal/Alertness: Awake/alert Behavior During Therapy: WFL for tasks assessed/performed Overall Cognitive Status: Within Functional Limits for tasks assessed                                         Exercises Total Joint Exercises Ankle Circles/Pumps: AROM;Both;10 reps Quad Sets: AROM;Right;Supine;10 reps Short Arc Quad: AROM;Right;10 reps;Supine Heel Slides: AAROM;Right;10 reps;Supine Hip ABduction/ADduction: AAROM;Right;10 reps;Supine Straight Leg Raises: AROM;Right;10 reps;Supine Goniometric ROM: 5-50* AAROM R knee    General Comments        Pertinent Vitals/Pain Pain Score: 8  Pain Location: R knee with walking Pain Descriptors / Indicators: Sore Pain Intervention(s): Limited activity within patient's tolerance;Monitored during session;Premedicated before session;Ice applied    Home Living                      Prior Function            PT Goals (current goals can now be found in the care plan section) Acute Rehab PT Goals Patient Stated Goal: to walk without pain, go shopping PT Goal Formulation: With patient/family Time For Goal Achievement: 03/25/18 Potential to Achieve Goals: Good Progress towards PT goals: Progressing toward goals    Frequency    7X/week      PT Plan Current plan remains appropriate    Co-evaluation              AM-PAC PT "6 Clicks" Daily Activity  Outcome Measure  Difficulty turning over in bed (including adjusting bedclothes, sheets and blankets)?: A Little Difficulty moving from lying  on back to sitting on the side of the bed? : A Little Difficulty sitting down on and standing up from a chair with arms (e.g., wheelchair, bedside commode, etc,.)?: A Little Help needed moving to and from a bed to chair (including a wheelchair)?: A Little Help needed walking in hospital room?: A Little Help needed climbing 3-5 steps with a railing? : A Lot 6 Click Score: 17    End of Session Equipment Utilized During Treatment: Gait belt Activity Tolerance: Patient limited by pain Patient left: with call bell/phone within reach;with family/visitor present;in bed Nurse Communication: Mobility status PT Visit  Diagnosis: Pain;Difficulty in walking, not elsewhere classified (R26.2);Muscle weakness (generalized) (M62.81) Pain - Right/Left: Right Pain - part of body: Knee     Time: 1950-9326 PT Time Calculation (min) (ACUTE ONLY): 25 min  Charges:  $Gait Training: 8-22 mins $Therapeutic Exercise: 8-22 mins $Therapeutic Activity: 8-22 mins                     Blondell Reveal Kistler PT 03/22/2018  Acute Rehabilitation Services Pager [phone] Office [phone]

## 2018-03-22 NOTE — Progress Notes (Signed)
Subjective: 1 Day Post-Op Procedure(s) (LRB): RIGHT TOTAL KNEE ARTHROPLASTY (Right) Patient reports pain as mild.   Patient seen in rounds by Dr. Wynelle Link. Patient is well, and has had no acute complaints or problems other than discomfort in the right knee. Denies chest pain, SOB or calf pain. Foley catheter removed this AM. No issues overnight. Did well ambulating with therapy yesterday, will continue working with PT today.   Objective: Vital signs in last 24 hours: Temp:  [97.6 F (36.4 C)-99.1 F (37.3 C)] 97.7 F (36.5 C) (10/01 0609) Pulse Rate:  [56-65] 58 (10/01 0609) Resp:  [10-23] 17 (10/01 0047) BP: (102-157)/(61-93) 102/71 (10/01 0609) SpO2:  [96 %-100 %] 100 % (10/01 0609) Weight:  [101.2 kg] 101.2 kg (09/30 0811)  Intake/Output from previous day:  Intake/Output Summary (Last 24 hours) at 03/22/2018 0733 Last data filed at 03/22/2018 0635 Gross per 24 hour  Intake 2443.24 ml  Output 2140 ml  Net 303.24 ml    Labs: Recent Labs    03/22/18 0534  HGB 10.2*   Recent Labs    03/22/18 0534  WBC 8.4  RBC 3.53*  HCT 31.2*  PLT 329   Recent Labs    03/22/18 0534  NA 139  K 3.8  CL 106  CO2 26  BUN 19  CREATININE 0.91  GLUCOSE 164*  CALCIUM 8.9   Exam: General - Patient is Alert and Oriented Extremity - Neurologically intact Neurovascular intact Sensation intact distally Dorsiflexion/Plantar flexion intact Dressing - dressing C/D/I Motor Function - intact, moving foot and toes well on exam.   Past Medical History:  Diagnosis Date  . Allergy codeine   rapid heart beat, cold sweat  . Aortic atherosclerosis ([place])   . Arthritis   . Breast cancer ([place]) 01/10/14   Left breast INVASIVE DUCTAL CARCINOMA STAGE 2  . Breast cancer ([place]) 2018   Right breast   . DDD (degenerative disc disease), lumbosacral   . DJD (degenerative joint disease)   . Grade I diastolic dysfunction 30/12/6224   noted on ECHO   . History of cardiomegaly 02/12/2014   Noted  on CXR  . Hypertension   . Hyperthyroidism    medication called in today for hyperthroid  . LVH (left ventricular hypertrophy) 04/03/2014   Mild, noted on ECHO   . OA (osteoarthritis)   . Personal history of chemotherapy 2015   Left Breast Cancer  . Personal history of radiation therapy 2015   Left Breast Cancer  . Personal history of radiation therapy 2018   Right breast cancer   . PONV (postoperative nausea and vomiting)   . S/P radiation therapy 09/21/14 completed   left breast  . Spinal stenosis    Moderate    Assessment/Plan: 1 Day Post-Op Procedure(s) (LRB): RIGHT TOTAL KNEE ARTHROPLASTY (Right) Active Problems:   OA (osteoarthritis) of knee  Estimated body mass index is 35.45 kg/m as calculated from the following:   Height as of this encounter: 5' 6.5" (1.689 m).   Weight as of this encounter: 101.2 kg. Advance diet Up with therapy  Anticipated LOS equal to or greater than 2 midnights due to - Age 70 and older with one or more of the following:  - Obesity  - Expected need for hospital services (PT, OT, Nursing) required for safe  discharge  - Anticipated need for postoperative skilled nursing care or inpatient rehab  - Active co-morbidities: None OR   - Unanticipated findings during/Post Surgery: None  - Patient is a  high risk of re-admission due to: None    DVT Prophylaxis - Xarelto Weight bearing as tolerated. D/C O2 and pulse ox and try on room air. Hemovac pulled without difficulty, will continue therapy today.  Plan is to go Home after hospital stay. Plan for discharge tomorrow.  Theresa Duty, PA-C Orthopedic Surgery 03/22/2018, 7:33 AM

## 2018-03-22 NOTE — Progress Notes (Signed)
Physical Therapy Treatment Patient Details Name: Monica [name] MRN: [id number] DOB: 24-Jul-1945 Today's Date: 03/22/2018    History of Present Illness R TKA, L TKA 4/19    PT Comments    Pt is progressing well with mobility, she ambulated 180' and performed TKA HEP with min assist.    Follow Up Recommendations  Follow surgeon's recommendation for DC plan and follow-up therapies;Outpatient PT     Equipment Recommendations  None recommended by PT    Recommendations for Other Services       Precautions / Restrictions Precautions Precautions: Knee Restrictions Weight Bearing Restrictions: No Other Position/Activity Restrictions: WBAT    Mobility  Bed Mobility Overal bed mobility: Modified Independent Bed Mobility: Supine to Sit     Supine to sit: Modified independent (Device/Increase time);HOB elevated     General bed mobility comments: HOB up, used rail  Transfers Overall transfer level: Needs assistance Equipment used: Rolling walker (2 wheeled) Transfers: Sit to/from Stand Sit to Stand: Min guard         General transfer comment: cues for hand placement  Ambulation/Gait Ambulation/Gait assistance: Min guard Gait Distance (Feet): 180 Feet Assistive device: Rolling walker (2 wheeled) Gait Pattern/deviations: Step-through pattern Gait velocity: decr   General Gait Details: steady no loss of balance, good sequencing   Stairs             Wheelchair Mobility    Modified Rankin (Stroke Patients Only)       Balance Overall balance assessment: Modified Independent                                          Cognition Arousal/Alertness: Awake/alert Behavior During Therapy: WFL for tasks assessed/performed Overall Cognitive Status: Within Functional Limits for tasks assessed                                        Exercises Total Joint Exercises Ankle Circles/Pumps: AROM;Both;10 reps Quad Sets: AROM;Right;5  reps;Supine Short Arc Quad: AROM;Right;10 reps;Supine Heel Slides: AAROM;Right;10 reps;Supine Hip ABduction/ADduction: AAROM;Right;10 reps;Supine Straight Leg Raises: AROM;Right;10 reps;Supine Goniometric ROM: 5-50* AAROM R knee    General Comments        Pertinent Vitals/Pain Pain Score: 6  Pain Location: R knee with walking Pain Descriptors / Indicators: Sore Pain Intervention(s): Limited activity within patient's tolerance;Monitored during session;Premedicated before session;Ice applied    Home Living                      Prior Function            PT Goals (current goals can now be found in the care plan section) Acute Rehab PT Goals Patient Stated Goal: to walk without pain, go shopping PT Goal Formulation: With patient/family Time For Goal Achievement: 03/25/18 Potential to Achieve Goals: Good Progress towards PT goals: Progressing toward goals    Frequency    7X/week      PT Plan      Co-evaluation              AM-PAC PT "6 Clicks" Daily Activity  Outcome Measure  Difficulty turning over in bed (including adjusting bedclothes, sheets and blankets)?: A Little Difficulty moving from lying on back to sitting on the side of the bed? : A Little Difficulty sitting down  on and standing up from a chair with arms (e.g., wheelchair, bedside commode, etc,.)?: A Little Help needed moving to and from a bed to chair (including a wheelchair)?: A Little Help needed walking in hospital room?: A Little Help needed climbing 3-5 steps with a railing? : A Lot 6 Click Score: 17    End of Session Equipment Utilized During Treatment: Gait belt Activity Tolerance: Patient tolerated treatment well Patient left: in chair;with call bell/phone within reach;with family/visitor present Nurse Communication: Mobility status PT Visit Diagnosis: Pain;Difficulty in walking, not elsewhere classified (R26.2);Muscle weakness (generalized) (M62.81) Pain - Right/Left:  Right Pain - part of body: Knee     Time: 0370-4888 PT Time Calculation (min) (ACUTE ONLY): 40 min  Charges:  $Gait Training: 8-22 mins $Therapeutic Exercise: 8-22 mins $Therapeutic Activity: 8-22 mins                     Blondell Reveal Kistler PT 03/22/2018  Acute Rehabilitation Services Pager [phone] Office [phone]

## 2018-03-23 LAB — BASIC METABOLIC PANEL
Anion gap: 9 (ref 5–15)
BUN: 15 mg/dL (ref 8–23)
CALCIUM: 9.4 mg/dL (ref 8.9–10.3)
CHLORIDE: 103 mmol/L (ref 98–111)
CO2: 28 mmol/L (ref 22–32)
Creatinine, Ser: 0.91 mg/dL (ref 0.44–1.00)
GFR calc non Af Amer: >60 mL/min (ref >60)
Glucose, Bld: 103 mg/dL — ABNORMAL HIGH (ref 70–99)
Potassium: 3.6 mmol/L (ref 3.5–5.1)
Sodium: 140 mmol/L (ref 135–145)

## 2018-03-23 LAB — CBC
HEMATOCRIT: 35.4 % — AB (ref 36.0–46.0)
Hemoglobin: 11.3 g/dL — ABNORMAL LOW (ref 12.0–15.0)
MCH: 28.5 pg (ref 26.0–34.0)
MCHC: 31.9 g/dL (ref 30.0–36.0)
MCV: 89.4 fL (ref 78.0–100.0)
Platelets: 356 10*3/uL (ref 150–400)
RBC: 3.96 MIL/uL (ref 3.87–5.11)
RDW: 14.3 % (ref 11.5–15.5)
WBC: 11.2 10*3/uL — AB (ref 4.0–10.5)

## 2018-03-23 MED ORDER — APIXABAN 2.5 MG PO TABS: 2.5000 mg | ORAL_TABLET | Freq: Two times a day (BID) | ORAL | 0 refills | Status: AC

## 2018-03-23 MED ORDER — OXYCODONE HCL 5 MG PO TABS: 5.0000 mg | ORAL_TABLET | Freq: Four times a day (QID) | ORAL | 0 refills | Status: AC | PRN

## 2018-03-23 MED ORDER — APIXABAN 2.5 MG PO TABS
2.5000 mg | ORAL_TABLET | Freq: Two times a day (BID) | ORAL | Status: DC
  Administered 2018-03-23: 2.5 mg via ORAL
  Filled 2018-03-23: qty 1

## 2018-03-23 MED ORDER — CYCLOBENZAPRINE HCL 10 MG PO TABS: 10.0000 mg | ORAL_TABLET | Freq: Three times a day (TID) | ORAL | 0 refills | Status: AC | PRN

## 2018-03-23 MED ORDER — CYCLOBENZAPRINE HCL 10 MG PO TABS
10.0000 mg | ORAL_TABLET | Freq: Three times a day (TID) | ORAL | Status: DC | PRN
  Administered 2018-03-23: 10 mg via ORAL
  Filled 2018-03-23: qty 1

## 2018-03-23 NOTE — Progress Notes (Signed)
Reviewed d/c instructions w/ patient and family. Provided all instructions, prescriptions, equipment. Verbalized understanding of instructions. Will d/c to home after lunch.

## 2018-03-23 NOTE — Progress Notes (Signed)
Patient discharged to home w/ family. Given all belongings, instructions, prescriptions, equipment. Escorted to pov via w/c. 

## 2018-03-23 NOTE — Progress Notes (Signed)
Subjective: 2 Days Post-Op Procedure(s) (LRB): RIGHT TOTAL KNEE ARTHROPLASTY (Right) Patient reports pain as mild.   Patient seen in rounds by Dr. Wynelle Link. Patient is well, and has had no acute complaints or problems other than an episode of vomiting after taking Xarelto yesterday. Nausea has since improved. Also endorses muscle spasms despite use of robaxin. Voiding without difficulty and positive flatus. Denies chest pain, SOB, or calf pain. Plan is to go Home after hospital stay.  Objective: Vital signs in last 24 hours: Temp:  [98.1 F (36.7 C)-98.5 F (36.9 C)] 98.5 F (36.9 C) (10/02 0518) Pulse Rate:  [58-67] 63 (10/02 0518) Resp:  [16] 16 (10/01 0930) BP: (119-133)/(63-69) 133/69 (10/02 0518) SpO2:  [96 %-100 %] 100 % (10/02 0518)  Intake/Output from previous day:  Intake/Output Summary (Last 24 hours) at 03/23/2018 0737 Last data filed at 03/23/2018 0148 Gross per 24 hour  Intake 1449.55 ml  Output 1100 ml  Net 349.55 ml    Labs: Recent Labs    03/22/18 0534 03/23/18 0417  HGB 10.2* 11.3*   Recent Labs    03/22/18 0534 03/23/18 0417  WBC 8.4 11.2*  RBC 3.53* 3.96  HCT 31.2* 35.4*  PLT 329 356   Recent Labs    03/22/18 0534 03/23/18 0417  NA 139 140  K 3.8 3.6  CL 106 103  CO2 26 28  BUN 19 15  CREATININE 0.91 0.91  GLUCOSE 164* 103*  CALCIUM 8.9 9.4   Exam: General - Patient is Alert and Oriented Extremity - Neurologically intact Neurovascular intact Sensation intact distally Dorsiflexion/Plantar flexion intact Dressing/Incision - clean, dry, no drainage Motor Function - intact, moving foot and toes well on exam.   Past Medical History:  Diagnosis Date  . Allergy codeine   rapid heart beat, cold sweat  . Aortic atherosclerosis ([place])   . Arthritis   . Breast cancer ([place]) 01/10/14   Left breast INVASIVE DUCTAL CARCINOMA STAGE 2  . Breast cancer ([place]) 2018   Right breast   . DDD (degenerative disc disease), lumbosacral   . DJD  (degenerative joint disease)   . Grade I diastolic dysfunction 63/87/5643   noted on ECHO   . History of cardiomegaly 02/12/2014   Noted on CXR  . Hypertension   . Hyperthyroidism    medication called in today for hyperthroid  . LVH (left ventricular hypertrophy) 04/03/2014   Mild, noted on ECHO   . OA (osteoarthritis)   . Personal history of chemotherapy 2015   Left Breast Cancer  . Personal history of radiation therapy 2015   Left Breast Cancer  . Personal history of radiation therapy 2018   Right breast cancer   . PONV (postoperative nausea and vomiting)   . S/P radiation therapy 09/21/14 completed   left breast  . Spinal stenosis    Moderate    Assessment/Plan: 2 Days Post-Op Procedure(s) (LRB): RIGHT TOTAL KNEE ARTHROPLASTY (Right) Active Problems:   OA (osteoarthritis) of knee  Estimated body mass index is 35.45 kg/m as calculated from the following:   Height as of this encounter: 5' 6.5" (1.689 m).   Weight as of this encounter: 101.2 kg. Up with therapy D/C IV fluids  DVT Prophylaxis - Xarelto Weight-bearing as tolerated  Xarelto switched to Eliquis, and methocarbamol switched to Flexaril 10 mg TID. Plan for discharge today once meeting goals with therapy. Scheduled for outpatient physical therapy at [facility]. Follow-up in the office in 2 weeks with Dr. Wynelle Link.  Theresa Duty, PA-C  Orthopedic Surgery 03/23/2018, 7:37 AM

## 2018-03-23 NOTE — Progress Notes (Signed)
Physical Therapy Treatment Patient Details Name: Monica [name] MRN: [id number] DOB: 06-18-1946 Today's Date: 03/23/2018    History of Present Illness R TKA, L TKA 4/19    PT Comments    Pt is progressing well with mobility, she ambulated 140' with RW, reviewed TKA HEP, and completed stair training. She is ready to DC home from PT standpoint.     Follow Up Recommendations  Follow surgeon's recommendation for DC plan and follow-up therapies;Outpatient PT     Equipment Recommendations  None recommended by PT    Recommendations for Other Services       Precautions / Restrictions Precautions Precautions: Knee Precaution Booklet Issued: Yes (comment) Precaution Comments: reviewed no pillow under knee Required Braces or Orthoses: Knee Immobilizer - Right Restrictions Weight Bearing Restrictions: No Other Position/Activity Restrictions: WBAT    Mobility  Bed Mobility Overal bed mobility: Modified Independent Bed Mobility: Supine to Sit     Supine to sit: Modified independent (Device/Increase time)        Transfers Overall transfer level: Needs assistance Equipment used: Rolling walker (2 wheeled) Transfers: Sit to/from Stand Sit to Stand: Modified independent (Device/Increase time)         General transfer comment: good safety awareness  Ambulation/Gait Ambulation/Gait assistance: Supervision Gait Distance (Feet): 140 Feet Assistive device: Rolling walker (2 wheeled) Gait Pattern/deviations: Step-through pattern Gait velocity: decr   General Gait Details: steady no loss of balance, good sequencing, distance limited by pain   Stairs Stairs: Yes Stairs assistance: Min guard Stair Management: Two rails;Forwards Number of Stairs: 5 General stair comments: pt reports she has an "elevator lift"  to get up the stairs to enter her home but she wanted to practice stairs anyway; VCs for sequencing   Wheelchair Mobility    Modified Rankin (Stroke Patients  Only)       Balance Overall balance assessment: Modified Independent                                          Cognition Arousal/Alertness: Awake/alert Behavior During Therapy: WFL for tasks assessed/performed Overall Cognitive Status: Within Functional Limits for tasks assessed                                        Exercises Total Joint Exercises Ankle Circles/Pumps: AROM;Both;10 reps Quad Sets: AROM;Right;5 reps;Supine Short Arc Quad: AROM;Right;10 reps;Supine Heel Slides: AAROM;Right;10 reps;Supine Hip ABduction/ADduction: AAROM;Right;10 reps;Supine Straight Leg Raises: AROM;Right;10 reps;Supine Long Arc Quad: AROM;Right;10 reps;Seated Knee Flexion: AAROM;Right;10 reps;Seated Goniometric ROM: 5-50* AAROM R knee    General Comments        Pertinent Vitals/Pain Pain Score: 8  Pain Location: R knee with walking Pain Descriptors / Indicators: Sore Pain Intervention(s): Limited activity within patient's tolerance;Monitored during session;Premedicated before session;Ice applied    Home Living                      Prior Function            PT Goals (current goals can now be found in the care plan section) Acute Rehab PT Goals Patient Stated Goal: to walk without pain, go shopping PT Goal Formulation: With patient/family Time For Goal Achievement: 03/25/18 Potential to Achieve Goals: Good Progress towards PT goals: Progressing toward goals    Frequency  7X/week      PT Plan Current plan remains appropriate    Co-evaluation              AM-PAC PT "6 Clicks" Daily Activity  Outcome Measure  Difficulty turning over in bed (including adjusting bedclothes, sheets and blankets)?: A Little Difficulty moving from lying on back to sitting on the side of the bed? : A Little Difficulty sitting down on and standing up from a chair with arms (e.g., wheelchair, bedside commode, etc,.)?: A Little Help needed moving to  and from a bed to chair (including a wheelchair)?: A Little Help needed walking in hospital room?: None Help needed climbing 3-5 steps with a railing? : A Little 6 Click Score: 19    End of Session Equipment Utilized During Treatment: Gait belt Activity Tolerance: Patient limited by pain;Patient tolerated treatment well Patient left: with call bell/phone within reach;with family/visitor present;in chair Nurse Communication: Mobility status PT Visit Diagnosis: Pain;Difficulty in walking, not elsewhere classified (R26.2);Muscle weakness (generalized) (M62.81) Pain - Right/Left: Right Pain - part of body: Knee     Time: 4315-4008 PT Time Calculation (min) (ACUTE ONLY): 40 min  Charges:  $Gait Training: 8-22 mins $Therapeutic Exercise: 8-22 mins $Therapeutic Activity: 8-22 mins                     Blondell Reveal Kistler PT 03/23/2018  Acute Rehabilitation Services Pager [phone] Office [phone]

## 2018-03-28 NOTE — Discharge Summary (Signed)
Physician Discharge Summary   Patient ID: Monica [name] MRN: [id number] DOB/AGE: 07-02-1945 72 y.o.  Admit date: 03/21/2018 Discharge date: 03/23/2018  Primary Diagnosis: Osteoarthritis, right knee   Admission Diagnoses:  Past Medical History:  Diagnosis Date  . Allergy codeine   rapid heart beat, cold sweat  . Aortic atherosclerosis ([place])   . Arthritis   . Breast cancer ([place]) 01/10/14   Left breast INVASIVE DUCTAL CARCINOMA STAGE 2  . Breast cancer ([facility]) 2018   Right breast   . DDD (degenerative disc disease), lumbosacral   . DJD (degenerative joint disease)   . Grade I diastolic dysfunction 97/67/3419   noted on ECHO   . History of cardiomegaly 02/12/2014   Noted on CXR  . Hypertension   . Hyperthyroidism    medication called in today for hyperthroid  . LVH (left ventricular hypertrophy) 04/03/2014   Mild, noted on ECHO   . OA (osteoarthritis)   . Personal history of chemotherapy 2015   Left Breast Cancer  . Personal history of radiation therapy 2015   Left Breast Cancer  . Personal history of radiation therapy 2018   Right breast cancer   . PONV (postoperative nausea and vomiting)   . S/P radiation therapy 09/21/14 completed   left breast  . Spinal stenosis    Moderate   Discharge Diagnoses:   Active Problems:   OA (osteoarthritis) of knee  Estimated body mass index is 35.45 kg/m as calculated from the following:   Height as of this encounter: 5' 6.5" (1.689 m).   Weight as of this encounter: 101.2 kg.  Procedure:  Procedure(s) (LRB): RIGHT TOTAL KNEE ARTHROPLASTY (Right)   Consults: None  HPI: Monica [name] is a 72 y.o. year old female with end stage OA of her right knee with progressively worsening pain and dysfunction. She has constant pain, with activity and at rest and significant functional deficits with difficulties even with ADLs. She has had extensive non-op management including analgesics, injections of cortisone, and home exercise program,  but remains in significant pain with significant dysfunction.Radiographs show bone on bone arthritis bone on bone medial and patellofemoral. She presents now for right Total Knee Arthroplasty.    Laboratory Data: Admission on 03/21/2018, Discharged on 03/23/2018  Component Date Value Ref Range Status  . WBC 03/22/2018 8.4  4.0 - 10.5 K/uL Final  . RBC 03/22/2018 3.53* 3.87 - 5.11 MIL/uL Final  . Hemoglobin 03/22/2018 10.2* 12.0 - 15.0 g/dL Final  . HCT 03/22/2018 31.2* 36.0 - 46.0 % Final  . MCV 03/22/2018 88.4  78.0 - 100.0 fL Final  . MCH 03/22/2018 28.9  26.0 - 34.0 pg Final  . MCHC 03/22/2018 32.7  30.0 - 36.0 g/dL Final  . RDW 03/22/2018 14.1  11.5 - 15.5 % Final  . Platelets 03/22/2018 329  150 - 400 K/uL Final   Performed at [facility], [place] [address], [place], [place] [address]  . Sodium 03/22/2018 139  135 - 145 mmol/L Final  . Potassium 03/22/2018 3.8  3.5 - 5.1 mmol/L Final  . Chloride 03/22/2018 106  98 - 111 mmol/L Final  . CO2 03/22/2018 26  22 - 32 mmol/L Final  . Glucose, Bld 03/22/2018 164* 70 - 99 mg/dL Final  . BUN 03/22/2018 19  8 - 23 mg/dL Final  . Creatinine, Ser 03/22/2018 0.91  0.44 - 1.00 mg/dL Final  . Calcium 03/22/2018 8.9  8.9 - 10.3 mg/dL Final  . GFR calc non Af Wyvonnia Lora  03/22/2018 >60  >60 mL/min Final  . GFR calc Af Amer 03/22/2018 >60  >60 mL/min Final   Comment: (NOTE) The eGFR has been calculated using the CKD EPI equation. This calculation has not been validated in all clinical situations. eGFR's persistently <60 mL/min signify possible Chronic Kidney Disease.   Georgiann Hahn gap 03/22/2018 7  5 - 15 Final   Performed at [facility], [place] [address], [place], [place] [address]  . WBC 03/23/2018 11.2* 4.0 - 10.5 K/uL Final  . RBC 03/23/2018 3.96  3.87 - 5.11 MIL/uL Final  . Hemoglobin 03/23/2018 11.3* 12.0 - 15.0 g/dL Final  . HCT 03/23/2018 35.4* 36.0 - 46.0 % Final  . MCV 03/23/2018 89.4  78.0 - 100.0 fL Final    . MCH 03/23/2018 28.5  26.0 - 34.0 pg Final  . MCHC 03/23/2018 31.9  30.0 - 36.0 g/dL Final  . RDW 03/23/2018 14.3  11.5 - 15.5 % Final  . Platelets 03/23/2018 356  150 - 400 K/uL Final   Performed at [facility], [place] [address], [place], [place] [address]  . Sodium 03/23/2018 140  135 - 145 mmol/L Final  . Potassium 03/23/2018 3.6  3.5 - 5.1 mmol/L Final  . Chloride 03/23/2018 103  98 - 111 mmol/L Final  . CO2 03/23/2018 28  22 - 32 mmol/L Final  . Glucose, Bld 03/23/2018 103* 70 - 99 mg/dL Final  . BUN 03/23/2018 15  8 - 23 mg/dL Final  . Creatinine, Ser 03/23/2018 0.91  0.44 - 1.00 mg/dL Final  . Calcium 03/23/2018 9.4  8.9 - 10.3 mg/dL Final  . GFR calc non Af Amer 03/23/2018 >60  >60 mL/min Final  . GFR calc Af Amer 03/23/2018 >60  >60 mL/min Final   Comment: (NOTE) The eGFR has been calculated using the CKD EPI equation. This calculation has not been validated in all clinical situations. eGFR's persistently <60 mL/min signify possible Chronic Kidney Disease.   Georgiann Hahn gap 03/23/2018 9  5 - 15 Final   Performed at [facility], [place] [address], [place], [place] [address]  Hospital Outpatient Visit on 03/14/2018  Component Date Value Ref Range Status  . aPTT 03/14/2018 31  24 - 36 seconds Final   Performed at [facility], [place] [address] [place], [place] [address]  . WBC 03/14/2018 4.6  4.0 - 10.5 K/uL Final  . RBC 03/14/2018 4.10  3.87 - 5.11 MIL/uL Final  . Hemoglobin 03/14/2018 11.9* 12.0 - 15.0 g/dL Final  . HCT 03/14/2018 36.8  36.0 - 46.0 % Final  . MCV 03/14/2018 89.8  78.0 - 100.0 fL Final  . MCH 03/14/2018 29.0  26.0 - 34.0 pg Final  . MCHC 03/14/2018 32.3  30.0 - 36.0 g/dL Final  . RDW 03/14/2018 14.5  11.5 - 15.5 % Final  . Platelets 03/14/2018 266  150 - 400 K/uL Final   Performed at [facility], [place] [address], [place], [place] [address]  . Sodium 03/14/2018 145  135 - 145 mmol/L  Final  . Potassium 03/14/2018 4.2  3.5 - 5.1 mmol/L Final  . Chloride 03/14/2018 107  98 - 111 mmol/L Final  . CO2 03/14/2018 31  22 - 32 mmol/L Final  . Glucose, Bld 03/14/2018 120* 70 - 99 mg/dL Final  . BUN 03/14/2018 20  8 - 23 mg/dL Final  . Creatinine, Ser 03/14/2018 0.93  0.44 - 1.00 mg/dL Final  . Calcium 03/14/2018  9.8  8.9 - 10.3 mg/dL Final  . Total Protein 03/14/2018 7.5  6.5 - 8.1 g/dL Final  . Albumin 03/14/2018 3.5  3.5 - 5.0 g/dL Final  . AST 03/14/2018 24  15 - 41 U/L Final  . ALT 03/14/2018 21  0 - 44 U/L Final  . Alkaline Phosphatase 03/14/2018 84  38 - 126 U/L Final  . Total Bilirubin 03/14/2018 0.6  0.3 - 1.2 mg/dL Final  . GFR calc non Af Amer 03/14/2018 >60  >60 mL/min Final  . GFR calc Af Amer 03/14/2018 >60  >60 mL/min Final   Comment: (NOTE) The eGFR has been calculated using the CKD EPI equation. This calculation has not been validated in all clinical situations. eGFR's persistently <60 mL/min signify possible Chronic Kidney Disease.   Georgiann Hahn gap 03/14/2018 7  5 - 15 Final   Performed at [facility], [place] [address] [place], [place] [address]  . Prothrombin Time 03/14/2018 12.4  11.4 - 15.2 seconds Final  . INR 03/14/2018 0.93   Final   Performed at [facility], [place] [address], [place], [place] [address]  . ABO/RH(D) 03/14/2018 B POS   Final  . Antibody Screen 03/14/2018 NEG   Final  . Sample Expiration 03/14/2018 03/24/2018   Final  . Extend sample reason 03/14/2018    Final                   Value:NO TRANSFUSIONS OR PREGNANCY IN THE PAST 3 MONTHS Performed at [facility], [place] [address], [place], [place] [address]   . MRSA, PCR 03/14/2018 NEGATIVE  NEGATIVE Final  . Staphylococcus aureus 03/14/2018 NEGATIVE  NEGATIVE Final   Comment: (NOTE) The Xpert SA Assay (FDA approved for NASAL specimens in patients 47 years of age and older), is one component of a comprehensive surveillance program.  It is not intended to diagnose infection nor to guide or monitor treatment. Performed at [facility], [place] [address], [place],  [address]      X-Rays:No results found.  EKG: Orders placed or performed during the hospital encounter of 04/02/17  . EKG 12 lead  . EKG 12 lead     Hospital Course: Monica [name] is a 72 y.o. who was admitted to [facility]. They were brought to the operating room on 03/21/2018 and underwent Procedure(s): RIGHT TOTAL KNEE ARTHROPLASTY.  Patient tolerated the procedure well and was later transferred to the recovery room and then to the orthopaedic floor for postoperative care. They were given PO and IV analgesics for pain control following their surgery. They were given 24 hours of postoperative antibiotics of  Anti-infectives (From admission, onward)   Start     Dose/Rate Route Frequency Ordered Stop   03/21/18 1630  ceFAZolin (ANCEF) IVPB 2g/100 mL premix     2 g 200 mL/hr over 30 Minutes Intravenous Every 6 hours 03/21/18 1348 03/21/18 2321   03/21/18 0800  ceFAZolin (ANCEF) IVPB 2g/100 mL premix     2 g 200 mL/hr over 30 Minutes Intravenous On call to O.R. 03/21/18 0757 03/21/18 1106     and started on DVT prophylaxis in the form of Xarelto.   PT and OT were ordered for total joint protocol. Discharge planning consulted to help with postop disposition and equipment needs. Patient had a good night on the evening of surgery. They started to get up OOB with therapy on POD #0. Hemovac drain was pulled without difficulty  on day one. Continued to work with therapy into POD #2. Pt was seen during rounds on day two and reported episode of vomiting after taking Xarelto the day prior, nausea had s ince improved. She also endorsed intense muscle spasms despite use of robaxin. Anticoagulation regimen switched to Eliquis 2.5 mg BID. Muscle relaxer switched to flexaril 10 mg TID. Dressing was changed and the incision was clean, dry, and  intact with no drainage. Pt worked with therapy for one additional session and was meeting their goals, with no further issues with nausea and muscle spasms had improved. She was discharged to home later that day in stable condition.  Diet: Cardiac diet Activity: WBAT Follow-up: in 2 weeks with Dr. Wynelle Link Disposition: Home with outpatient therapy at [facility] Discharged Condition: stable   Discharge Instructions    Call MD / Call 911   Complete by:  As directed    If you experience chest pain or shortness of breath, CALL 911 and be transported to the hospital emergency room.  If you develope a fever above 101 F, pus (white drainage) or increased drainage or redness at the wound, or calf pain, call your surgeon's office.   Change dressing   Complete by:  As directed    Change the dressing daily with sterile 4 x 4 inch gauze dressing and apply TED hose.   Constipation Prevention   Complete by:  As directed    Drink plenty of fluids.  Prune juice may be helpful.  You may use a stool softener, such as Colace (over the counter) 100 mg twice a day.  Use MiraLax (over the counter) for constipation as needed.   Diet - low sodium heart healthy   Complete by:  As directed    Discharge instructions   Complete by:  As directed    Dr. Gaynelle Arabian Total Joint Specialist [facility] [address], [place], [place] [address] [phone]  TOTAL KNEE REPLACEMENT POSTOPERATIVE DIRECTIONS  Knee Rehabilitation, Guidelines Following Surgery  Results after knee surgery are often greatly improved when you follow the exercise, range of motion and muscle strengthening exercises prescribed by your doctor. Safety measures are also important to protect the knee from further injury. Any time any of these exercises cause you to have increased pain or swelling in your knee joint, decrease the amount until you are comfortable again and slowly increase them. If you have problems or questions, call  your caregiver or physical therapist for advice.   HOME CARE INSTRUCTIONS  Remove items at home which could result in a fall. This includes throw rugs or furniture in walking pathways.  ICE to the affected knee every three hours for 30 minutes at a time and then as needed for pain and swelling.  Continue to use ice on the knee for pain and swelling from surgery. You may notice swelling that will progress down to the foot and ankle.  This is normal after surgery.  Elevate the leg when you are not up walking on it.   Continue to use the breathing machine which will help keep your temperature down.  It is common for your temperature to cycle up and down following surgery, especially at night when you are not up moving around and exerting yourself.  The breathing machine keeps your lungs expanded and your temperature down. Do not place pillow under knee, focus on keeping the knee straight while resting   DIET You may resume your previous home diet once your are  discharged from the hospital.  DRESSING / WOUND CARE / SHOWERING You may shower 3 days after surgery, but keep the wounds dry during showering.  You may use an occlusive plastic wrap (Press'n Seal for example), NO SOAKING/SUBMERGING IN THE BATHTUB.  If the bandage gets wet, change with a clean dry gauze.  If the incision gets wet, pat the wound dry with a clean towel. You may start showering once you are discharged home but do not submerge the incision under water. Just pat the incision dry and apply a dry gauze dressing on daily. Change the surgical dressing daily and reapply a dry dressing each time.  ACTIVITY Walk with your walker as instructed. Use walker as long as suggested by your caregivers. Avoid periods of inactivity such as sitting longer than an hour when not asleep. This helps prevent blood clots.  You may resume a sexual relationship in one month or when given the OK by your doctor.  You may return to work once you are cleared  by your doctor.  Do not drive a car for 6 weeks or until released by you surgeon.  Do not drive while taking narcotics.  WEIGHT BEARING Weight bearing as tolerated with assist device (walker, cane, etc) as directed, use it as long as suggested by your surgeon or therapist, typically at least 4-6 weeks.  POSTOPERATIVE CONSTIPATION PROTOCOL Constipation - defined medically as fewer than three stools per week and severe constipation as less than one stool per week.  One of the most common issues patients have following surgery is constipation.  Even if you have a regular bowel pattern at home, your normal regimen is likely to be disrupted due to multiple reasons following surgery.  Combination of anesthesia, postoperative narcotics, change in appetite and fluid intake all can affect your bowels.  In order to avoid complications following surgery, here are some recommendations in order to help you during your recovery period.  Colace (docusate) - Pick up an over-the-counter form of Colace or another stool softener and take twice a day as long as you are requiring postoperative pain medications.  Take with a full glass of water daily.  If you experience loose stools or diarrhea, hold the colace until you stool forms back up.  If your symptoms do not get better within 1 week or if they get worse, check with your doctor.  Dulcolax (bisacodyl) - Pick up over-the-counter and take as directed by the product packaging as needed to assist with the movement of your bowels.  Take with a full glass of water.  Use this product as needed if not relieved by Colace only.   MiraLax (polyethylene glycol) - Pick up over-the-counter to have on hand.  MiraLax is a solution that will increase the amount of water in your bowels to assist with bowel movements.  Take as directed and can mix with a glass of water, juice, soda, coffee, or tea.  Take if you go more than two days without a movement. Do not use MiraLax more than  once per day. Call your doctor if you are still constipated or irregular after using this medication for 7 days in a row.  If you continue to have problems with postoperative constipation, please contact the office for further assistance and recommendations.  If you experience "the worst abdominal pain ever" or develop nausea or vomiting, please contact the office immediatly for further recommendations for treatment.  ITCHING  If you experience itching with your medications, try taking only  a single pain pill, or even half a pain pill at a time.  You can also use Benadryl over the counter for itching or also to help with sleep.   TED HOSE STOCKINGS Wear the elastic stockings on both legs for three weeks following surgery during the day but you may remove then at night for sleeping.  MEDICATIONS See your medication summary on the "After Visit Summary" that the nursing staff will review with you prior to discharge.  You may have some home medications which will be placed on hold until you complete the course of blood thinner medication.  It is important for you to complete the blood thinner medication as prescribed by your surgeon.  Continue your approved medications as instructed at time of discharge.  PRECAUTIONS If you experience chest pain or shortness of breath - call 911 immediately for transfer to the hospital emergency department.  If you develop a fever greater that 101 F, purulent drainage from wound, increased redness or drainage from wound, foul odor from the wound/dressing, or calf pain - CONTACT YOUR SURGEON.                                                   FOLLOW-UP APPOINTMENTS Make sure you keep all of your appointments after your operation with your surgeon and caregivers. You should call the office at the above phone number and make an appointment for approximately two weeks after the date of your surgery or on the date instructed by your surgeon outlined in the "After Visit  Summary".   RANGE OF MOTION AND STRENGTHENING EXERCISES  Rehabilitation of the knee is important following a knee injury or an operation. After just a few days of immobilization, the muscles of the thigh which control the knee become weakened and shrink (atrophy). Knee exercises are designed to build up the tone and strength of the thigh muscles and to improve knee motion. Often times heat used for twenty to thirty minutes before working out will loosen up your tissues and help with improving the range of motion but do not use heat for the first two weeks following surgery. These exercises can be done on a training (exercise) mat, on the floor, on a table or on a bed. Use what ever works the best and is most comfortable for you Knee exercises include:  Leg Lifts - While your knee is still immobilized in a splint or cast, you can do straight leg raises. Lift the leg to 60 degrees, hold for 3 sec, and slowly lower the leg. Repeat 10-20 times 2-3 times daily. Perform this exercise against resistance later as your knee gets better.  Quad and Hamstring Sets - Tighten up the muscle on the front of the thigh (Quad) and hold for 5-10 sec. Repeat this 10-20 times hourly. Hamstring sets are done by pushing the foot backward against an object and holding for 5-10 sec. Repeat as with quad sets.  Leg Slides: Lying on your back, slowly slide your foot toward your buttocks, bending your knee up off the floor (only go as far as is comfortable). Then slowly slide your foot back down until your leg is flat on the floor again. Angel Wings: Lying on your back spread your legs to the side as far apart as you can without causing discomfort.  A rehabilitation program following serious  knee injuries can speed recovery and prevent re-injury in the future due to weakened muscles. Contact your doctor or a physical therapist for more information on knee rehabilitation.   IF YOU ARE TRANSFERRED TO A SKILLED REHAB FACILITY If the  patient is transferred to a skilled rehab facility following release from the hospital, a list of the current medications will be sent to the facility for the patient to continue.  When discharged from the skilled rehab facility, please have the facility set up the patient's [place] prior to being released. Also, the skilled facility will be responsible for providing the patient with their medications at time of release from the facility to include their pain medication, the muscle relaxants, and their blood thinner medication. If the patient is still at the rehab facility at time of the two week follow up appointment, the skilled rehab facility will also need to assist the patient in arranging follow up appointment in our office and any transportation needs.  MAKE SURE YOU:  Understand these instructions.  Get help right away if you are not doing well or get worse.    Pick up stool softner and laxative for home use following surgery while on pain medications. Do not submerge incision under water. Please use good hand washing techniques while changing dressing each day. May shower starting three days after surgery. Please use a clean towel to pat the incision dry following showers. Continue to use ice for pain and swelling after surgery. Do not use any lotions or creams on the incision until instructed by your surgeon.   Do not put a pillow under the knee. Place it under the heel.   Complete by:  As directed    Driving restrictions   Complete by:  As directed    No driving for two weeks   TED hose   Complete by:  As directed    Use stockings (TED hose) for three weeks on both leg(s).  You may remove them at night for sleeping.   Weight bearing as tolerated   Complete by:  As directed      Allergies as of 03/23/2018      Reactions   Codeine Palpitations, Other (See Comments)   Heart rate increased, cold sweats    Adhesive [tape]    Pulls off skin   Tramadol Nausea  And Vomiting   Upset stomach      Medication List    STOP taking these medications   Ginkgo Biloba 120 MG Caps   meloxicam 15 MG tablet Commonly known as:  MOBIC   methocarbamol 500 MG tablet Commonly known as:  ROBAXIN   OMEGA 3 PO   oxyCODONE-acetaminophen 5-325 MG tablet Commonly known as:  PERCOCET/ROXICET   potassium chloride SA 20 MEQ tablet Commonly known as:  K-DUR,KLOR-CON   rivaroxaban 10 MG Tabs tablet Commonly known as:  XARELTO   Vitamin D3 2000 units Tabs     TAKE these medications   amLODipine 5 MG tablet Commonly known as:  NORVASC Take 5 mg by mouth daily.   anastrozole 1 MG tablet Commonly known as:  ARIMIDEX Take 1 tablet (1 mg total) by mouth daily.   apixaban 2.5 MG Tabs tablet Commonly known as:  ELIQUIS Take 1 tablet (2.5 mg total) by mouth 2 (two) times daily for 19 days. Then take one 81 mg aspirin once a day for three weeks. Then discontinue aspirin.   CLEAR EYES OP Place 1 drop into both eyes daily  as needed (dry eyes).   cloNIDine 0.1 mg/24hr patch Commonly known as:  CATAPRES - Dosed in mg/24 hr Place 1 patch (0.1 mg total) onto the skin once a week.   COLACE PO Take 3 capsules by mouth daily.   cyclobenzaprine 10 MG tablet Commonly known as:  FLEXERIL Take 1 tablet (10 mg total) by mouth 3 (three) times daily as needed for muscle spasms.   diclofenac sodium 1 % Gel Commonly known as:  VOLTAREN Apply 1 application topically as needed (knee pain).   diphenhydrAMINE 25 MG tablet Commonly known as:  BENADRYL Take 50 mg by mouth at bedtime.   gabapentin 300 MG capsule Commonly known as:  NEURONTIN Take 1 capsule (300 mg total) by mouth at bedtime.   levothyroxine 75 MCG tablet Commonly known as:  SYNTHROID, LEVOTHROID Take 75 mcg by mouth daily before breakfast.   oxyCODONE 5 MG immediate release tablet Commonly known as:  Oxy IR/ROXICODONE Take 1-2 tablets (5-10 mg total) by mouth every 6 (six) hours as needed for  moderate pain (pain score 4-6).   valsartan-hydrochlorothiazide 320-25 MG tablet Commonly known as:  DIOVAN-HCT Take 1 tablet by mouth daily.            Discharge Care Instructions  (From admission, onward)         Start     Ordered   03/23/18 0000  Weight bearing as tolerated     03/23/18 1022   03/23/18 0000  Change dressing    Comments:  Change the dressing daily with sterile 4 x 4 inch gauze dressing and apply TED hose.   03/23/18 1022         Follow-up Information    Gaynelle Arabian, MD. Schedule an appointment as soon as possible for a visit on 04/05/2018.   Specialty:  Orthopedic Surgery Contact information: [address] [place] [address] [phone]           Signed: Theresa Duty, PA-C Orthopedic Surgery 03/28/2018, 9:10 AM

## 2018-03-29 ENCOUNTER — Telehealth: Payer: Self-pay | Admitting: *Deleted

## 2018-03-29 NOTE — Telephone Encounter (Signed)
Pt called and spoke with this RN per continued joint pain occurring since starting the Arimidex " and what else I can do "  She changed the time of taking the medication with no noted benefit.  Monica [name] states she recently had a knee replacement and " the joint pain with the knee surgery is just working against each other ".  Per chart review and prior call with recommendation - this RN informed pt since changing the time of medication was not beneficial she should hold the medication for 2 weeks then call this RN with update.  Monica [name] verbalized understanding.  No further needs at this time.

## 2018-03-30 DIAGNOSIS — M25561 Pain in right knee: Secondary | ICD-10-CM | POA: Diagnosis not present

## 2018-04-01 DIAGNOSIS — M25561 Pain in right knee: Secondary | ICD-10-CM | POA: Diagnosis not present

## 2018-04-04 DIAGNOSIS — M25561 Pain in right knee: Secondary | ICD-10-CM | POA: Diagnosis not present

## 2018-04-05 DIAGNOSIS — Z853 Personal history of malignant neoplasm of breast: Secondary | ICD-10-CM | POA: Diagnosis not present

## 2018-04-05 DIAGNOSIS — C50919 Malignant neoplasm of unspecified site of unspecified female breast: Secondary | ICD-10-CM | POA: Diagnosis not present

## 2018-04-05 DIAGNOSIS — I10 Essential (primary) hypertension: Secondary | ICD-10-CM | POA: Diagnosis not present

## 2018-04-05 DIAGNOSIS — M170 Bilateral primary osteoarthritis of knee: Secondary | ICD-10-CM | POA: Diagnosis not present

## 2018-04-05 DIAGNOSIS — E039 Hypothyroidism, unspecified: Secondary | ICD-10-CM | POA: Diagnosis not present

## 2018-04-05 DIAGNOSIS — J45991 Cough variant asthma: Secondary | ICD-10-CM | POA: Diagnosis not present

## 2018-04-06 DIAGNOSIS — M25561 Pain in right knee: Secondary | ICD-10-CM | POA: Diagnosis not present

## 2018-04-08 DIAGNOSIS — M25561 Pain in right knee: Secondary | ICD-10-CM | POA: Diagnosis not present

## 2018-04-12 ENCOUNTER — Telehealth: Payer: Self-pay | Admitting: *Deleted

## 2018-04-12 DIAGNOSIS — Z471 Aftercare following joint replacement surgery: Secondary | ICD-10-CM | POA: Diagnosis not present

## 2018-04-12 DIAGNOSIS — Z96651 Presence of right artificial knee joint: Secondary | ICD-10-CM | POA: Diagnosis not present

## 2018-04-12 NOTE — Telephone Encounter (Signed)
"  Monica [name] [phone]) calling with update.  I've not used anastrozole for two weeks.  Every joint in my body no longer hurts.  What does Dr. Jana Hakim want me to do next?  Previously I changed the time of day, took medication every other day which did not help or relieve joint pain.  Should I start taking it again?"  Do not resume at this time.  Nurse will call upon receipt of provider orders and instructions.    See 02-02-2018 and 03-29-2018 phone notes.

## 2018-04-12 NOTE — Telephone Encounter (Signed)
Please see note from triage Dr. Jana Hakim

## 2018-04-13 ENCOUNTER — Other Ambulatory Visit: Payer: Self-pay | Admitting: Oncology

## 2018-04-13 ENCOUNTER — Telehealth: Payer: Self-pay | Admitting: Oncology

## 2018-04-13 NOTE — Telephone Encounter (Signed)
Scheduled appt per 10/23 sch message -sent reminder letter in the mail with appt date and time.

## 2018-04-14 DIAGNOSIS — Z96651 Presence of right artificial knee joint: Secondary | ICD-10-CM | POA: Diagnosis not present

## 2018-04-19 DIAGNOSIS — M25561 Pain in right knee: Secondary | ICD-10-CM | POA: Diagnosis not present

## 2018-04-20 DIAGNOSIS — N390 Urinary tract infection, site not specified: Secondary | ICD-10-CM | POA: Diagnosis not present

## 2018-04-20 DIAGNOSIS — R300 Dysuria: Secondary | ICD-10-CM | POA: Diagnosis not present

## 2018-04-21 DIAGNOSIS — M25561 Pain in right knee: Secondary | ICD-10-CM | POA: Diagnosis not present

## 2018-04-27 DIAGNOSIS — Z96651 Presence of right artificial knee joint: Secondary | ICD-10-CM | POA: Diagnosis not present

## 2018-04-27 DIAGNOSIS — Z471 Aftercare following joint replacement surgery: Secondary | ICD-10-CM | POA: Diagnosis not present

## 2018-05-09 DIAGNOSIS — J45991 Cough variant asthma: Secondary | ICD-10-CM | POA: Diagnosis not present

## 2018-05-09 DIAGNOSIS — C50919 Malignant neoplasm of unspecified site of unspecified female breast: Secondary | ICD-10-CM | POA: Diagnosis not present

## 2018-05-09 DIAGNOSIS — I10 Essential (primary) hypertension: Secondary | ICD-10-CM | POA: Diagnosis not present

## 2018-05-09 DIAGNOSIS — Z853 Personal history of malignant neoplasm of breast: Secondary | ICD-10-CM | POA: Diagnosis not present

## 2018-05-09 DIAGNOSIS — M170 Bilateral primary osteoarthritis of knee: Secondary | ICD-10-CM | POA: Diagnosis not present

## 2018-05-09 DIAGNOSIS — E039 Hypothyroidism, unspecified: Secondary | ICD-10-CM | POA: Diagnosis not present

## 2018-05-10 DIAGNOSIS — M5416 Radiculopathy, lumbar region: Secondary | ICD-10-CM | POA: Diagnosis not present

## 2018-05-10 DIAGNOSIS — M5126 Other intervertebral disc displacement, lumbar region: Secondary | ICD-10-CM | POA: Diagnosis not present

## 2018-05-10 DIAGNOSIS — Z79899 Other long term (current) drug therapy: Secondary | ICD-10-CM | POA: Diagnosis not present

## 2018-05-10 DIAGNOSIS — R69 Illness, unspecified: Secondary | ICD-10-CM | POA: Diagnosis not present

## 2018-05-10 DIAGNOSIS — M47812 Spondylosis without myelopathy or radiculopathy, cervical region: Secondary | ICD-10-CM | POA: Diagnosis not present

## 2018-05-11 ENCOUNTER — Other Ambulatory Visit: Payer: Medicare HMO

## 2018-05-11 ENCOUNTER — Ambulatory Visit: Payer: Medicare HMO | Admitting: Oncology

## 2018-05-30 DIAGNOSIS — I10 Essential (primary) hypertension: Secondary | ICD-10-CM | POA: Diagnosis not present

## 2018-05-30 DIAGNOSIS — E2839 Other primary ovarian failure: Secondary | ICD-10-CM | POA: Diagnosis not present

## 2018-05-30 DIAGNOSIS — M170 Bilateral primary osteoarthritis of knee: Secondary | ICD-10-CM | POA: Diagnosis not present

## 2018-05-30 DIAGNOSIS — D72819 Decreased white blood cell count, unspecified: Secondary | ICD-10-CM | POA: Diagnosis not present

## 2018-05-30 DIAGNOSIS — I700 Atherosclerosis of aorta: Secondary | ICD-10-CM | POA: Diagnosis not present

## 2018-05-30 DIAGNOSIS — Z853 Personal history of malignant neoplasm of breast: Secondary | ICD-10-CM | POA: Diagnosis not present

## 2018-05-30 DIAGNOSIS — D709 Neutropenia, unspecified: Secondary | ICD-10-CM | POA: Diagnosis not present

## 2018-05-30 DIAGNOSIS — M5126 Other intervertebral disc displacement, lumbar region: Secondary | ICD-10-CM | POA: Diagnosis not present

## 2018-05-30 DIAGNOSIS — E039 Hypothyroidism, unspecified: Secondary | ICD-10-CM | POA: Diagnosis not present

## 2018-06-02 ENCOUNTER — Other Ambulatory Visit: Payer: Self-pay | Admitting: Oncology

## 2018-06-03 ENCOUNTER — Telehealth: Payer: Self-pay | Admitting: Oncology

## 2018-06-03 NOTE — Telephone Encounter (Signed)
R./s appt per 12/12 sch message - pt is aware of appt date and time

## 2018-06-06 DIAGNOSIS — C50919 Malignant neoplasm of unspecified site of unspecified female breast: Secondary | ICD-10-CM | POA: Diagnosis not present

## 2018-06-06 DIAGNOSIS — E039 Hypothyroidism, unspecified: Secondary | ICD-10-CM | POA: Diagnosis not present

## 2018-06-06 DIAGNOSIS — J45991 Cough variant asthma: Secondary | ICD-10-CM | POA: Diagnosis not present

## 2018-06-06 DIAGNOSIS — M170 Bilateral primary osteoarthritis of knee: Secondary | ICD-10-CM | POA: Diagnosis not present

## 2018-06-06 DIAGNOSIS — I10 Essential (primary) hypertension: Secondary | ICD-10-CM | POA: Diagnosis not present

## 2018-06-06 DIAGNOSIS — Z853 Personal history of malignant neoplasm of breast: Secondary | ICD-10-CM | POA: Diagnosis not present

## 2018-06-28 DIAGNOSIS — E039 Hypothyroidism, unspecified: Secondary | ICD-10-CM | POA: Diagnosis not present

## 2018-07-04 DIAGNOSIS — J45991 Cough variant asthma: Secondary | ICD-10-CM | POA: Diagnosis not present

## 2018-07-04 DIAGNOSIS — E039 Hypothyroidism, unspecified: Secondary | ICD-10-CM | POA: Diagnosis not present

## 2018-07-04 DIAGNOSIS — I10 Essential (primary) hypertension: Secondary | ICD-10-CM | POA: Diagnosis not present

## 2018-07-04 DIAGNOSIS — M170 Bilateral primary osteoarthritis of knee: Secondary | ICD-10-CM | POA: Diagnosis not present

## 2018-07-04 DIAGNOSIS — Z853 Personal history of malignant neoplasm of breast: Secondary | ICD-10-CM | POA: Diagnosis not present

## 2018-07-04 DIAGNOSIS — C50919 Malignant neoplasm of unspecified site of unspecified female breast: Secondary | ICD-10-CM | POA: Diagnosis not present

## 2018-07-06 ENCOUNTER — Other Ambulatory Visit: Payer: Medicare HMO

## 2018-07-06 ENCOUNTER — Inpatient Hospital Stay: Payer: Medicare HMO | Admitting: Adult Health

## 2018-07-06 ENCOUNTER — Ambulatory Visit: Payer: Medicare HMO | Admitting: Oncology

## 2018-07-06 ENCOUNTER — Telehealth: Payer: Self-pay

## 2018-07-06 ENCOUNTER — Inpatient Hospital Stay: Payer: Medicare HMO

## 2018-07-06 NOTE — Telephone Encounter (Signed)
Per 1/15 patient requested r/s appointment for lab and provider. Printed a calender

## 2018-07-06 NOTE — Progress Notes (Deleted)
[facility]  [phone] [phone]     ID: DALILA ARCA DOB: 10-19-1945  MR#: [id number]  ZDG#:[id number]  Patient Care Team: Maury Dus, MD as PCP - General (Family Medicine) Excell Seltzer, MD as Consulting Physician (General Surgery) Magrinat, Virgie Dad, MD as Consulting Physician (Oncology) Maisie Fus, MD as Consulting Physician (Obstetrics and Gynecology) Kennith Center, RD as Dietitian (Family Medicine) Marlaine Hind, MD as Consulting Physician (Physical Medicine and Rehabilitation) Gaynelle Arabian, MD as Consulting Physician (Orthopedic Surgery)   CHIEF COMPLAINT: Ductal carcinoma in situ, estrogen receptor positive  CURRENT TREATMENT: Anastrozole  BREAST CANCER HISTORY: From the original intake note:  [facility] showing a possible mass in her left breast. On 01/05/2014 left diagnostic mammography and ultrasonography at the breast Center showed 2 adjacent poorly defined masses deep in the lower outer portion of the left breast. On physical exam these were palpable measuring approximately 3 cm altogether, at the 4:00 position 16 cm from the left nipple. There were no palpable left axillary lymph nodes. Ultrasound confirmed to and adjacent irregularly marginated hypoechoic masses in the area in question, measuring 1.5 and 1.0 cm respectively. The total area of by ultrasonography measured 2.6 cm. In addition, to left axillary lymph nodes showed mild cortical thickening in one of them showed loss of the normal fatty hilum.  On 01/10/2014 the patient underwent separate biopsies of both the left breast masses in question as well as the more suspicious lymph node. The final pathology ([id number]) showed the lymph node to be benign. (This was interpreted as concordant). Both of the breast masses, however, were positive for an invasive ductal carcinoma, which was HER-2 not amplified,  with the signals ratio of 0.98 and the number per cell being 2.45. Estrogen and progesterone receptor determination is pending but looks negative at this point.  On 01/16/2014 the patient underwent bilateral breast MRI the right breast was unremarkable. In the left breast lower outer quadrant there were 2 oval masses measuring 1.5 and 2.2 cm. Taken together they was approximately 4 cm of disease. There were no findings of concern in the right breast. In the left axilla there was a single mildly prominent lymph node. There was no suspicious internal mammary adenopathy.  The patient's subsequent history is as detailed below  INTERVAL HISTORY: Amorita returns today for a follow-up and treatment of her essentially triple negative breast cancer (very weak estrogen positivity) and her estrogen receptor positive ductal carcinoma in situ.   She continues on anastrozole, which she tolerates well.    On 09/17/2017 she underwent a Diagnostic Bilateral Mammogram with CAD and TOMO, breast density category B, showing no evidence of malignancy.   REVIEW OF SYSTEMS: Estill Bamberg   PAST MEDICAL HISTORY: Past Medical History:  Diagnosis Date  . Allergy codeine   rapid heart beat, cold sweat  . Aortic atherosclerosis ([facility])   . Arthritis   . Breast cancer ([facility]) 01/10/14   Left breast INVASIVE DUCTAL CARCINOMA STAGE 2  . Breast cancer ([facility]) 2018   Right breast   . DDD (degenerative disc disease), lumbosacral   . DJD (degenerative joint disease)   . Grade I diastolic dysfunction 56/43/3295   noted on ECHO   . History of cardiomegaly 02/12/2014   Noted on CXR  . Hypertension   . Hyperthyroidism    medication called in today for hyperthroid  . LVH (left ventricular hypertrophy) 04/03/2014   Mild, noted on ECHO   .  OA (osteoarthritis)   . Personal history of chemotherapy 2015   Left Breast Cancer  . Personal history of radiation therapy 2015   Left Breast Cancer  . Personal history of radiation therapy 2018    Right breast cancer   . PONV (postoperative nausea and vomiting)   . S/P radiation therapy 09/21/14 completed   left breast  . Spinal stenosis    Moderate    PAST SURGICAL HISTORY: Past Surgical History:  Procedure Laterality Date  . ABDOMINAL HYSTERECTOMY  1992  . BREAST LUMPECTOMY Left 02/12/2014  . BREAST LUMPECTOMY Right 2018  . BREAST LUMPECTOMY WITH NEEDLE LOCALIZATION AND AXILLARY SENTINEL LYMPH NODE BX Left 02/12/2014   Procedure: LEFT BREAST LUMPECTOMY WITH NEEDLE LOCALIZATION AND LEFT AXILLARY SENTINEL LYMPH NODE BX;  Surgeon: [name], MD;  Location: [facility];  Service: General;  Laterality: Left;  . BREAST LUMPECTOMY WITH RADIOACTIVE SEED LOCALIZATION Right 04/02/2017   Procedure: BREAST LUMPECTOMY WITH RADIOACTIVE SEED LOCALIZATION;  Surgeon: Excell Seltzer, MD;  Location: [facility];  Service: General;  Laterality: Right;  . COLONOSCOPY    . FOOT ARTHROPLASTY  1998   right  . KNEE ARTHROSCOPY  2012   right  . LUMBAR LAMINECTOMY  1991  . PORTACATH PLACEMENT Right 02/12/2014   Procedure: INSERTION PORT-A-CATH;  Surgeon: [name], MD;  Location: [facility];  Service: General;  Laterality: Right;  . portacath removal    . TOTAL KNEE ARTHROPLASTY Left 09/20/2017   Procedure: LEFT TOTAL KNEE ARTHROPLASTY;  Surgeon: Gaynelle Arabian, MD;  Location: [facility];  Service: Orthopedics;  Laterality: Left;  Adductor Block  . TOTAL KNEE ARTHROPLASTY Right 03/21/2018   Procedure: RIGHT TOTAL KNEE ARTHROPLASTY;  Surgeon: Gaynelle Arabian, MD;  Location: [facility];  Service: Orthopedics;  Laterality: Right;    FAMILY HISTORY Family History  Problem Relation Age of Onset  . Cancer Sister   . Cancer Brother   . Cancer Maternal Aunt   . Cancer Paternal Aunt    the patient's father died at the age of 41 from a stroke. The patient's mother died at the age of 66 from "multiple causes". The patient had 2 brothers and 2 sisters.  The patient's sister was diagnosed with uterine cancer at the age of 8, and the patient's brother [name] had "bone cancer" and died in his 20s. He was treated at the  [facility]. There is no history of breast or ovarian cancer in the family to the patient's knowledge.  GYNECOLOGIC HISTORY:  No LMP recorded. Patient has had a hysterectomy. Menarche age 89, the patient is GX P0. She underwent hysterectomy in 1992, with no salpingo-oophorectomy. She has been on estrogen replacement since. 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 Her husband [name] worked for [employer] in [place] as a Facilities manager. The patient's adopted son [name] works as a laborer in [place]. The patient's god-daughter [name] is an Optometrist. The patient has no grandchildren. The patient attends a local church of  [employer] [place], and her husband [name] is pastor   ADVANCED DIRECTIVES: In place   HEALTH MAINTENANCE: Social History   Tobacco Use  . Smoking status: Never Smoker  . Smokeless tobacco: Never Used  Substance Use Topics  . Alcohol use: No  . Drug use: No     Colonoscopy: October 2008  PAP:  July 2015  Bone density: 01/01/2014  Lipid panel:  Allergies  Allergen Reactions  . Codeine Palpitations and Other (See Comments)    Heart rate increased, cold sweats   . Adhesive [Tape]     Pulls off skin  . Tramadol Nausea And Vomiting    Upset stomach    Current Outpatient Medications  Medication Sig Dispense Refill  . amLODipine (NORVASC) 5 MG tablet Take 5 mg by mouth daily.    [name] anastrozole (ARIMIDEX) 1 MG tablet Take 1 tablet (1 mg total) by mouth daily. 90 tablet 4  . apixaban (ELIQUIS) 2.5 MG TABS tablet Take 1 tablet (2.5 mg total) by mouth 2 (two) times daily for 19 days. Then take one 81 mg aspirin once a day for three weeks. Then discontinue  aspirin. 38 tablet 0  . cloNIDine (CATAPRES-TTS-1) 0.1 mg/24hr patch Place 1 patch (0.1 mg total) onto the skin once a week. 12 patch 3  . cyclobenzaprine (FLEXERIL) 10 MG tablet Take 1 tablet (10 mg total) by mouth 3 (three) times daily as needed for muscle spasms. 40 tablet 0  . diclofenac sodium (VOLTAREN) 1 % GEL Apply 1 application topically as needed (knee pain).    [name] diphenhydrAMINE (BENADRYL) 25 MG tablet Take 50 mg by mouth at bedtime.    Mariane Baumgarten Sodium (COLACE PO) Take 3 capsules by mouth daily.    [name] gabapentin (NEURONTIN) 300 MG capsule Take 1 capsule (300 mg total) by mouth at bedtime. 90 capsule 0  . levothyroxine (SYNTHROID, LEVOTHROID) 75 MCG tablet Take 75 mcg by mouth daily before breakfast.    . Naphazoline HCl (CLEAR EYES OP) Place 1 drop into both eyes daily as needed (dry eyes).    [name] oxyCODONE (OXY IR/ROXICODONE) 5 MG immediate release tablet Take 1-2 tablets (5-10 mg total) by mouth every 6 (six) hours as needed for moderate pain (pain score 4-6). 56 tablet 0  . valsartan-hydrochlorothiazide (DIOVAN-HCT) 320-25 MG per tablet Take 1 tablet by mouth daily.     No current facility-administered medications for this visit.     OBJECTIVE: Middle-aged [place] American woman who appears stated age   There were no vitals filed for this visit.   There is no height or weight on file to calculate BMI.    ECOG FS:1 - Symptomatic but completely ambulatory  GENERAL: Patient is a well appearing female in no acute distress HEENT:  Sclerae anicteric.  Oropharynx clear and moist. No ulcerations or evidence of oropharyngeal candidiasis. Neck is supple.  NODES:  No cervical, supraclavicular, or axillary lymphadenopathy palpated.  BREAST EXAM:  Deferred. LUNGS:  Clear to auscultation bilaterally.  No wheezes or rhonchi. HEART:  Regular rate and rhythm. No murmur appreciated. ABDOMEN:  Soft, nontender.  Positive, normoactive bowel sounds. No organomegaly palpated. MSK:  No focal spinal  tenderness to palpation. Full range of motion bilaterally in the upper extremities. EXTREMITIES:  No peripheral edema.   SKIN:  Clear with no obvious rashes or skin changes. No nail dyscrasia. NEURO:  Nonfocal. Well oriented.  Appropriate affect.    LAB RESULTS:  CMP     Component Value Date/Time   NA 140 03/23/2018 0417   NA 142 04/26/2017 1126   K 3.6 03/23/2018 0417   K 4.0 04/26/2017 1126   CL 103 03/23/2018 0417   CO2 28 03/23/2018 0417   CO2 28 04/26/2017 1126   GLUCOSE 103 (H) 03/23/2018 0417   GLUCOSE 90 04/26/2017 1126   BUN 15 03/23/2018 0417  BUN 18.0 04/26/2017 1126   CREATININE 0.91 03/23/2018 0417   CREATININE 0.9 04/26/2017 1126   CALCIUM 9.4 03/23/2018 0417   CALCIUM 9.9 04/26/2017 1126   PROT 7.5 03/14/2018 1114   PROT 7.5 04/26/2017 1126   ALBUMIN 3.5 03/14/2018 1114   ALBUMIN 3.6 04/26/2017 1126   AST 24 03/14/2018 1114   AST 21 04/26/2017 1126   ALT 21 03/14/2018 1114   ALT 15 04/26/2017 1126   ALKPHOS 84 03/14/2018 1114   ALKPHOS 99 04/26/2017 1126   BILITOT 0.6 03/14/2018 1114   BILITOT 0.52 04/26/2017 1126   GFRNONAA >60 03/23/2018 0417   GFRAA >60 03/23/2018 0417    I No results found for: SPEP  Lab Results  Component Value Date   WBC 11.2 (H) 03/23/2018   NEUTROABS 2.0 11/11/2017   HGB 11.3 (L) 03/23/2018   HCT 35.4 (L) 03/23/2018   MCV 89.4 03/23/2018   PLT 356 03/23/2018      Chemistry      Component Value Date/Time   NA 140 03/23/2018 0417   NA 142 04/26/2017 1126   K 3.6 03/23/2018 0417   K 4.0 04/26/2017 1126   CL 103 03/23/2018 0417   CO2 28 03/23/2018 0417   CO2 28 04/26/2017 1126   BUN 15 03/23/2018 0417   BUN 18.0 04/26/2017 1126   CREATININE 0.91 03/23/2018 0417   CREATININE 0.9 04/26/2017 1126      Component Value Date/Time   CALCIUM 9.4 03/23/2018 0417   CALCIUM 9.9 04/26/2017 1126   ALKPHOS 84 03/14/2018 1114   ALKPHOS 99 04/26/2017 1126   AST 24 03/14/2018 1114   AST 21 04/26/2017 1126   ALT 21  03/14/2018 1114   ALT 15 04/26/2017 1126   BILITOT 0.6 03/14/2018 1114   BILITOT 0.52 04/26/2017 1126       No results found for: LABCA2  No components found for: LABCA125  No results for input(s): INR in the last 168 hours.  Urinalysis    Component Value Date/Time   COLORURINE AMBER (A) 01/16/2016 1751    STUDIES: On 09/17/2017 she underwent a Diagnostic Bilateral Mammogram with CAD and TOMO, breast density category B, showing no evidence of malignancy.   ASSESSMENT: 73 y.o. [place] woman status post left breast lower outer quadrant biopsy of 2 separate lower outer quadrant masses 01/10/2014 for a multifocal cT2 cNX, stage II invasive ductal carcinoma, grade 3, with an MIB-1 of 90%, HER-2 negative, estrogen receptors 3% positive with weak staining, progesterone receptor negative, with an MIB-1 of 90%; this is clinically a "triple-negative" breast cancer and will be treated as such  (1) a suspicious left axillary lymph node biopsied 01/10/2014 was negative (concordant)  (2) left lumpectomy and sentinel lymph node sampling 02/12/2014 showed an mpT2 pN0, stage IIA invasive ductal carcinoma grade 3, repeat HER-2 again negative  (3) adjuvant chemotherapy consisted of  (a) dose dense doxorubicin and cyclophosphamide x4 completed 05/02/2014  (b) weekly paclitaxel and carboplatin with 3 of 12 doses given, then stopped because of neuropathy  (c) Carboplatin and Gemzar started 06/11/2014, given on day 1 & day 8 of a 21 days cycle for 3 cycles, completed 07/30/2014  (4) adjuvant radiation 08/27/2014 through 09/21/2014:  The patient initially received a dose of 42.5 Gy in 17 fractions to the left breast using whole-breast tangent fields. This was delivered using a 3-D conformal technique. The patient then received a boost to the seroma. This delivered an additional 7.5 Gy in 3 fractions using a 3  field photon technique due to the depth of the seroma. The total dose was 50 Gy.  (5)  tamoxifen started (prophylactically) in December 2016; stopped after 2 weeks due to side effects.   (a) given the very weak and low estrogen receptor positivity, resumption of anti-estrogens not anticipated  (6) right breast upper outer quadrant biopsy 04/16/2016 showed no evidence of malignancy  (7) right lumpectomy April 02, 2017 showed ductal carcinoma in situ, grade 1, measuring 0.2 cm, with negative margins.  This was estrogen receptor 100% positive and progesterone receptor 95% positive.  (8) anastrozole started 05/03/2017  (a) bone density at the breast center 09/18/2016 showed a T score of 0.2 (NORMAL)  (9) adjuvant radiation12/12/18-01/10/19 Site/dose:  Right breast/ 42.5 Gy in 17 fractions    PLAN:  Baruch Goldmann, NP  07/06/18 6:46 AM Medical Oncology and Hematology [facility] [place], [place] [address] Tel. [phone]    Fax. [phone]

## 2018-07-08 ENCOUNTER — Encounter: Payer: Self-pay | Admitting: Adult Health

## 2018-07-08 ENCOUNTER — Telehealth: Payer: Self-pay | Admitting: Oncology

## 2018-07-08 ENCOUNTER — Inpatient Hospital Stay: Payer: Medicare HMO | Attending: Oncology

## 2018-07-08 ENCOUNTER — Inpatient Hospital Stay (HOSPITAL_BASED_OUTPATIENT_CLINIC_OR_DEPARTMENT_OTHER): Payer: Medicare HMO | Admitting: Adult Health

## 2018-07-08 VITALS — BP 142/67 | HR 63 | Temp 98.3°F | Resp 16 | Ht 66.5 in | Wt 226.5 lb

## 2018-07-08 DIAGNOSIS — D0511 Intraductal carcinoma in situ of right breast: Secondary | ICD-10-CM

## 2018-07-08 DIAGNOSIS — I10 Essential (primary) hypertension: Secondary | ICD-10-CM | POA: Diagnosis not present

## 2018-07-08 DIAGNOSIS — E0590 Thyrotoxicosis, unspecified without thyrotoxic crisis or storm: Secondary | ICD-10-CM

## 2018-07-08 DIAGNOSIS — R232 Flushing: Secondary | ICD-10-CM

## 2018-07-08 DIAGNOSIS — Z79811 Long term (current) use of aromatase inhibitors: Secondary | ICD-10-CM

## 2018-07-08 DIAGNOSIS — Z171 Estrogen receptor negative status [ER-]: Secondary | ICD-10-CM | POA: Diagnosis not present

## 2018-07-08 DIAGNOSIS — Z170 Estrogen receptor positive status [ER+]: Secondary | ICD-10-CM

## 2018-07-08 DIAGNOSIS — C50512 Malignant neoplasm of lower-outer quadrant of left female breast: Secondary | ICD-10-CM

## 2018-07-08 DIAGNOSIS — Z9221 Personal history of antineoplastic chemotherapy: Secondary | ICD-10-CM | POA: Diagnosis not present

## 2018-07-08 DIAGNOSIS — Z923 Personal history of irradiation: Secondary | ICD-10-CM | POA: Diagnosis not present

## 2018-07-08 DIAGNOSIS — Z7901 Long term (current) use of anticoagulants: Secondary | ICD-10-CM | POA: Diagnosis not present

## 2018-07-08 DIAGNOSIS — Z79899 Other long term (current) drug therapy: Secondary | ICD-10-CM | POA: Insufficient documentation

## 2018-07-08 DIAGNOSIS — C50211 Malignant neoplasm of upper-inner quadrant of right female breast: Secondary | ICD-10-CM

## 2018-07-08 LAB — COMPREHENSIVE METABOLIC PANEL
ALK PHOS: 127 U/L — AB (ref 38–126)
ALT: 17 U/L (ref 0–44)
AST: 20 U/L (ref 15–41)
Albumin: 3.5 g/dL (ref 3.5–5.0)
Anion gap: 7 (ref 5–15)
BILIRUBIN TOTAL: 0.3 mg/dL (ref 0.3–1.2)
BUN: 22 mg/dL (ref 8–23)
CALCIUM: 9.5 mg/dL (ref 8.9–10.3)
CO2: 29 mmol/L (ref 22–32)
CREATININE: 0.97 mg/dL (ref 0.44–1.00)
Chloride: 106 mmol/L (ref 98–111)
GFR, EST NON AFRICAN AMERICAN: 59 mL/min — AB (ref >60)
Glucose, Bld: 100 mg/dL — ABNORMAL HIGH (ref 70–99)
Potassium: 3.4 mmol/L — ABNORMAL LOW (ref 3.5–5.1)
Sodium: 142 mmol/L (ref 135–145)
TOTAL PROTEIN: 7.2 g/dL (ref 6.5–8.1)

## 2018-07-08 LAB — CBC WITH DIFFERENTIAL/PLATELET
Abs Immature Granulocytes: 0 10*3/uL (ref 0.00–0.07)
BASOS ABS: 0 10*3/uL (ref 0.0–0.1)
Basophils Relative: 1 %
EOS ABS: 0.1 10*3/uL (ref 0.0–0.5)
EOS PCT: 4 %
HEMATOCRIT: 38.3 % (ref 36.0–46.0)
Hemoglobin: 12.1 g/dL (ref 12.0–15.0)
Immature Granulocytes: 0 %
Lymphocytes Relative: 28 %
Lymphs Abs: 1 10*3/uL (ref 0.7–4.0)
MCH: 28.3 pg (ref 26.0–34.0)
MCHC: 31.6 g/dL (ref 30.0–36.0)
MCV: 89.7 fL (ref 80.0–100.0)
Monocytes Absolute: 0.3 10*3/uL (ref 0.1–1.0)
Monocytes Relative: 9 %
NRBC: 0 % (ref 0.0–0.2)
Neutro Abs: 2 10*3/uL (ref 1.7–7.7)
Neutrophils Relative %: 58 %
Platelets: 238 10*3/uL (ref 150–400)
RBC: 4.27 MIL/uL (ref 3.87–5.11)
RDW: 15.1 % (ref 11.5–15.5)
WBC: 3.4 10*3/uL — AB (ref 4.0–10.5)

## 2018-07-08 NOTE — Telephone Encounter (Signed)
Gave avs and calendar ° °

## 2018-07-08 NOTE — Progress Notes (Signed)
[facility]  Telephone:([phone]) [phone] Fax:(336) [phone]     ID: Monica [name] DOB: [birth year]  MR#: [id number]  XBW#:[id number]  Patient Care Team: Maury Dus, MD as PCP - General (Family Medicine) Excell Seltzer, MD as Consulting Physician (General Surgery) Magrinat, Virgie Dad, MD as Consulting Physician (Oncology) Maisie Fus, MD as Consulting Physician (Obstetrics and Gynecology) Kennith Center, RD as Dietitian (Family Medicine) Marlaine Hind, MD as Consulting Physician (Physical Medicine and Rehabilitation) Gaynelle Arabian, MD as Consulting Physician (Orthopedic Surgery)   CHIEF COMPLAINT: Ductal carcinoma in situ, estrogen receptor positive  CURRENT TREATMENT: Anastrozole  BREAST CANCER HISTORY: From the original intake note:  [name] showing a possible mass in her left breast. On 01/05/2014 left diagnostic mammography and ultrasonography at the breast Center showed 2 adjacent poorly defined masses deep in the lower outer portion of the left breast. On physical exam these were palpable measuring approximately 3 cm altogether, at the 4:00 position 16 cm from the left nipple. There were no palpable left axillary lymph nodes. Ultrasound confirmed to and adjacent irregularly marginated hypoechoic masses in the area in question, measuring 1.5 and 1.0 cm respectively. The total area of by ultrasonography measured 2.6 cm. In addition, to left axillary lymph nodes showed mild cortical thickening in one of them showed loss of the normal fatty hilum.  On 01/10/2014 the patient underwent separate biopsies of both the left breast masses in question as well as the more suspicious lymph node. The final pathology ([id number]) showed the lymph node to be benign. (This was interpreted as concordant). Both of the breast masses, however, were positive for an invasive ductal carcinoma, which was HER-2 not amplified,  with the signals ratio of 0.98 and the number per cell being 2.45. Estrogen and progesterone receptor determination is pending but looks negative at this point.  On 01/16/2014 the patient underwent bilateral breast MRI the right breast was unremarkable. In the left breast lower outer quadrant there were 2 oval masses measuring 1.5 and 2.2 cm. Taken together they was approximately 4 cm of disease. There were no findings of concern in the right breast. In the left axilla there was a single mildly prominent lymph node. There was no suspicious internal mammary adenopathy.  The patient's subsequent history is as detailed below  INTERVAL HISTORY: Monica [name] returns today for a follow-up and treatment of her essentially triple negative breast cancer (very weak estrogen positivity) and her estrogen receptor positive ductal carcinoma in situ. She is doing well today.  She notes that she stopped taking Anastrozole in 03/2018 due to significant increase in arthralgias particularly around the time she had her knee replacements.  She has not yet restarted anti estrogen therapy and would like to review her options today.  She notes that Dr. Jana Hakim was going to tell her what he wanted her to start on during her visit in December that she had to reschedule.  Monica [name] is up to date with mammogram and bone density testing.  She exercises occasionally.  Since her lats visit she has been diagnosed with hypothyroidism and is taking Synthroid daily.    REVIEW OF SYSTEMS: Monica [name] is feeling well today.  She denies any current concerns.  She denies fevers, chills, weight loss.  She denies any chest pain, palpitations, cough, or shortness of breath.  She isn't having dysphagia, nausea, vomiting, constipation, diarrhea.  A detailed ROS was otherwise non contributory.   PAST MEDICAL HISTORY: Past Medical  History:  Diagnosis Date  . Allergy codeine   rapid heart beat, cold sweat  . Aortic atherosclerosis ([place])   . Arthritis   .  Breast cancer ([place]) 01/10/14   Left breast INVASIVE DUCTAL CARCINOMA STAGE 2  . Breast cancer ([place]) 2018   Right breast   . DDD (degenerative disc disease), lumbosacral   . DJD (degenerative joint disease)   . Grade I diastolic dysfunction 47/02/6282   noted on ECHO   . History of cardiomegaly 02/12/2014   Noted on CXR  . Hypertension   . Hyperthyroidism    medication called in today for hyperthroid  . LVH (left ventricular hypertrophy) 04/03/2014   Mild, noted on ECHO   . OA (osteoarthritis)   . Personal history of chemotherapy 2015   Left Breast Cancer  . Personal history of radiation therapy 2015   Left Breast Cancer  . Personal history of radiation therapy 2018   Right breast cancer   . PONV (postoperative nausea and vomiting)   . S/P radiation therapy 09/21/14 completed   left breast  . Spinal stenosis    Moderate    PAST SURGICAL HISTORY: Past Surgical History:  Procedure Laterality Date  . ABDOMINAL HYSTERECTOMY  1992  . BREAST LUMPECTOMY Left 02/12/2014  . BREAST LUMPECTOMY Right 2018  . BREAST LUMPECTOMY WITH NEEDLE LOCALIZATION AND AXILLARY SENTINEL LYMPH NODE BX Left 02/12/2014   Procedure: LEFT BREAST LUMPECTOMY WITH NEEDLE LOCALIZATION AND LEFT AXILLARY SENTINEL LYMPH NODE BX;  Surgeon: [name], MD;  Location: [facility];  Service: General;  Laterality: Left;  . BREAST LUMPECTOMY WITH RADIOACTIVE SEED LOCALIZATION Right 04/02/2017   Procedure: BREAST LUMPECTOMY WITH RADIOACTIVE SEED LOCALIZATION;  Surgeon: Excell Seltzer, MD;  Location: [facility];  Service: General;  Laterality: Right;  . COLONOSCOPY    . FOOT ARTHROPLASTY  1998   right  . KNEE ARTHROSCOPY  2012   right  . LUMBAR LAMINECTOMY  1991  . PORTACATH PLACEMENT Right 02/12/2014   Procedure: INSERTION PORT-A-CATH;  Surgeon: [name], MD;  Location: [facility];  Service: General;  Laterality: Right;  . portacath removal    . TOTAL  KNEE ARTHROPLASTY Left 09/20/2017   Procedure: LEFT TOTAL KNEE ARTHROPLASTY;  Surgeon: Gaynelle Arabian, MD;  Location: [facility];  Service: Orthopedics;  Laterality: Left;  Adductor Block  . TOTAL KNEE ARTHROPLASTY Right 03/21/2018   Procedure: RIGHT TOTAL KNEE ARTHROPLASTY;  Surgeon: Gaynelle Arabian, MD;  Location: [facility];  Service: Orthopedics;  Laterality: Right;    FAMILY HISTORY Family History  Problem Relation Age of Onset  . Cancer Sister   . Cancer Brother   . Cancer Maternal Aunt   . Cancer Paternal Aunt    the patient's father died at the age of 45 from a stroke. The patient's mother died at the age of 50 from "multiple causes". The patient had 2 brothers and 2 sisters. The patient's sister was diagnosed with uterine cancer at the age of 62, and the patient's brother [name] had "bone cancer" and died in his 16s. He was treated at the [facility]. There is no history of breast or ovarian cancer in the family to the patient's knowledge.  GYNECOLOGIC HISTORY:  No LMP recorded. Patient has had a hysterectomy. Menarche age 39, the patient is GX P0. She underwent hysterectomy in 1992, with no salpingo-oophorectomy. She has been on estrogen replacement since. 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 Her husband Monica [name] worked for [employer] in [place] as a Facilities manager. The patient's adopted son Monica [name] works as a laborer in [place]. The patient's god-daughter Monica [name] is an Optometrist. The patient has no grandchildren. The patient attends a local church of  [employer], and her husband Monica [name] is pastor   ADVANCED DIRECTIVES: In place   HEALTH MAINTENANCE: Social History   Tobacco Use  . Smoking status: Never Smoker  . Smokeless tobacco: Never Used  Substance Use Topics  . Alcohol use: No  . Drug use: No     Colonoscopy:  October 2008  PAP: July 2015  Bone density: 01/01/2014  Lipid panel:  Allergies  Allergen Reactions  . Codeine Palpitations and Other (See Comments)    Heart rate increased, cold sweats   . Adhesive [Tape]     Pulls off skin  . Tramadol Nausea And Vomiting    Upset stomach    Current Outpatient Medications  Medication Sig Dispense Refill  . amLODipine (NORVASC) 5 MG tablet Take 5 mg by mouth daily.    . Cholecalciferol (VITAMIN D-3 PO) Vitamin D3    . Cholecalciferol 50 MCG (2000 UT) CAPS Take by mouth.    . cloNIDine (CATAPRES-TTS-1) 0.1 mg/24hr patch Place 1 patch (0.1 mg total) onto the skin once a week. 12 patch 3  . diclofenac sodium (VOLTAREN) 1 % GEL Apply 1 application topically as needed (knee pain).    [name] diphenhydrAMINE (BENADRYL) 25 MG tablet Take 50 mg by mouth at bedtime.    Mariane Baumgarten Sodium (COLACE PO) Take 3 capsules by mouth daily.    [name] docusate sodium (STOOL SOFTENER) 100 MG capsule Stool Softener    . gabapentin (NEURONTIN) 300 MG capsule Take 1 capsule (300 mg total) by mouth at bedtime. 90 capsule 0  . Ginkgo Biloba 120 MG TABS Take by mouth.    . levothyroxine (SYNTHROID, LEVOTHROID) 75 MCG tablet Take 75 mcg by mouth daily before breakfast.    . meloxicam (MOBIC) 15 MG tablet     . Naphazoline HCl (CLEAR EYES OP) Place 1 drop into both eyes daily as needed (dry eyes).    [name] oxyCODONE (OXY IR/ROXICODONE) 5 MG immediate release tablet Take 1-2 tablets (5-10 mg total) by mouth every 6 (six) hours as needed for moderate pain (pain score 4-6). 56 tablet 0  . valsartan-hydrochlorothiazide (DIOVAN-HCT) 320-25 MG per tablet Take 1 tablet by mouth daily.    [name] anastrozole (ARIMIDEX) 1 MG tablet Take 1 tablet (1 mg total) by mouth daily. (Patient not taking: Reported on 07/08/2018) 90 tablet 4  . apixaban (ELIQUIS) 2.5 MG TABS tablet Take 1 tablet (2.5 mg total) by mouth 2 (two) times daily for 19 days. Then take one 81 mg aspirin once a day for three weeks. Then  discontinue aspirin. 38 tablet 0  . cyclobenzaprine (FLEXERIL) 10 MG tablet Take 1 tablet (10 mg total) by mouth 3 (three) times daily as needed for muscle spasms. (Patient not taking: Reported on 07/08/2018) 40 tablet 0   No current facility-administered medications for this visit.     OBJECTIVE:  Vitals:   07/08/18 1333  BP: (!) 142/67  Pulse: 63  Resp: 16  Temp: 98.3 F (36.8 C)  SpO2: 99%     Body mass index is 36.01 kg/m.    ECOG FS:1 - Symptomatic but completely ambulatory GENERAL: Patient is a well  appearing female in no acute distress HEENT:  Sclerae anicteric.  Oropharynx clear and moist. No ulcerations or evidence of oropharyngeal candidiasis. Neck is supple.  NODES:  No cervical, supraclavicular, or axillary lymphadenopathy palpated.  BREAST EXAM:  Right breast s/p lumpectomy, no sign of local recurrence, left breast s/p lumpectomy, no sign of local recurrence LUNGS:  Clear to auscultation bilaterally.  No wheezes or rhonchi. HEART:  Regular rate and rhythm. No murmur appreciated. ABDOMEN:  Soft, nontender.  Positive, normoactive bowel sounds. No organomegaly palpated. MSK:  No focal spinal tenderness to palpation. Full range of motion bilaterally in the upper extremities. EXTREMITIES:  No peripheral edema.   SKIN:  Clear with no obvious rashes or skin changes. No nail dyscrasia. NEURO:  Nonfocal. Well oriented.  Appropriate affect.     LAB RESULTS:  CMP     Component Value Date/Time   NA 142 07/08/2018 1319   NA 142 04/26/2017 1126   K 3.4 (L) 07/08/2018 1319   K 4.0 04/26/2017 1126   CL 106 07/08/2018 1319   CO2 29 07/08/2018 1319   CO2 28 04/26/2017 1126   GLUCOSE 100 (H) 07/08/2018 1319   GLUCOSE 90 04/26/2017 1126   BUN 22 07/08/2018 1319   BUN 18.0 04/26/2017 1126   CREATININE 0.97 07/08/2018 1319   CREATININE 0.9 04/26/2017 1126   CALCIUM 9.5 07/08/2018 1319   CALCIUM 9.9 04/26/2017 1126   PROT 7.2 07/08/2018 1319   PROT 7.5 04/26/2017 1126    ALBUMIN 3.5 07/08/2018 1319   ALBUMIN 3.6 04/26/2017 1126   AST 20 07/08/2018 1319   AST 21 04/26/2017 1126   ALT 17 07/08/2018 1319   ALT 15 04/26/2017 1126   ALKPHOS 127 (H) 07/08/2018 1319   ALKPHOS 99 04/26/2017 1126   BILITOT 0.3 07/08/2018 1319   BILITOT 0.52 04/26/2017 1126   GFRNONAA 59 (L) 07/08/2018 1319   GFRAA >60 07/08/2018 1319    I No results found for: SPEP  Lab Results  Component Value Date   WBC 3.4 (L) 07/08/2018   NEUTROABS 2.0 07/08/2018   HGB 12.1 07/08/2018   HCT 38.3 07/08/2018   MCV 89.7 07/08/2018   PLT 238 07/08/2018      Chemistry      Component Value Date/Time   NA 142 07/08/2018 1319   NA 142 04/26/2017 1126   K 3.4 (L) 07/08/2018 1319   K 4.0 04/26/2017 1126   CL 106 07/08/2018 1319   CO2 29 07/08/2018 1319   CO2 28 04/26/2017 1126   BUN 22 07/08/2018 1319   BUN 18.0 04/26/2017 1126   CREATININE 0.97 07/08/2018 1319   CREATININE 0.9 04/26/2017 1126      Component Value Date/Time   CALCIUM 9.5 07/08/2018 1319   CALCIUM 9.9 04/26/2017 1126   ALKPHOS 127 (H) 07/08/2018 1319   ALKPHOS 99 04/26/2017 1126   AST 20 07/08/2018 1319   AST 21 04/26/2017 1126   ALT 17 07/08/2018 1319   ALT 15 04/26/2017 1126   BILITOT 0.3 07/08/2018 1319   BILITOT 0.52 04/26/2017 1126       No results found for: LABCA2  No components found for: LABCA125  No results for input(s): INR in the last 168 hours.  Urinalysis    Component Value Date/Time   COLORURINE AMBER (A) 01/16/2016 1751    STUDIES:    ASSESSMENT: 73 y.o. [place] woman status post left breast lower outer quadrant biopsy of 2 separate lower outer quadrant masses 01/10/2014 for a multifocal cT2  cNX, stage II invasive ductal carcinoma, grade 3, with an MIB-1 of 90%, HER-2 negative, estrogen receptors 3% positive with weak staining, progesterone receptor negative, with an MIB-1 of 90%; this is clinically a "triple-negative" breast cancer and will be treated as such  (1) a  suspicious left axillary lymph node biopsied 01/10/2014 was negative (concordant)  (2) left lumpectomy and sentinel lymph node sampling 02/12/2014 showed an mpT2 pN0, stage IIA invasive ductal carcinoma grade 3, repeat HER-2 again negative  (3) adjuvant chemotherapy consisted of  (a) dose dense doxorubicin and cyclophosphamide x4 completed 05/02/2014  (b) weekly paclitaxel and carboplatin with 3 of 12 doses given, then stopped because of neuropathy  (c) Carboplatin and Gemzar started 06/11/2014, given on day 1 & day 8 of a 21 days cycle for 3 cycles, completed 07/30/2014  (4) adjuvant radiation 08/27/2014 through 09/21/2014:  The patient initially received a dose of 42.5 Gy in 17 fractions to the left breast using whole-breast tangent fields. This was delivered using a 3-D conformal technique. The patient then received a boost to the seroma. This delivered an additional 7.5 Gy in 3 fractions using a 3 field photon technique due to the depth of the seroma. The total dose was 50 Gy.  (5) tamoxifen started (prophylactically) in December 2016; stopped after 2 weeks due to side effects.   (a) given the very weak and low estrogen receptor positivity, resumption of anti-estrogens not anticipated  (6) right breast upper outer quadrant biopsy 04/16/2016 showed no evidence of malignancy  (7) right lumpectomy April 02, 2017 showed ductal carcinoma in situ, grade 1, measuring 0.2 cm, with negative margins.  This was estrogen receptor 100% positive and progesterone receptor 95% positive.  (8) anastrozole started 05/03/2017, stopped in 03/2018 due to arthralgias  (a) bone density at the breast center 09/18/2016 showed a T score of 0.2 (NORMAL)  (9) adjuvant radiation12/12/18-01/10/19 Site/dose:  Right breast/ 42.5 Gy in 17 fractions    PLAN:  Monica [name] is doing well today.  She has no clinical or radiographic sign of recurrence.  She will undergo repeat mammogram in 08/2018 when due.  Her last mammogram  was normal.  She is not currently taking anti estrogen therapy and is very interested in trying another option.  I reviewed her options with her including Letrozole, Exemestane, and Tamoxifen.  She remembers that she didn't initially tolerate Tamoxifen well when she was started on it in 05/2015.  She remembers it making her feel like she was on chemotherapy.  We reviewed the risks and benefits of all of the different anti estrogen therapies.  Dr. Jana Hakim will return on Wednesday, and we will wait until then to start anything.  I will send it in and call her.    Monica [name] was counseled on healthy diet and exercise today.    We will go ahead and schedule to see Monica [name] in 3 months for evaluation of how she is tolerating the new anti estrogen therapy. She knows to call for any issues that may develop before that visit.  A total of (30) minutes of face-to-face time was spent with this patient with greater than 50% of that time in counseling and care-coordination.   Wilber Bihari, NP  07/08/18 3:00 PM Medical Oncology and Hematology [facility] [address] [place], [place] [address] Tel. [phone]    Fax. [phone]

## 2018-07-13 ENCOUNTER — Other Ambulatory Visit: Payer: Self-pay | Admitting: Adult Health

## 2018-07-13 DIAGNOSIS — C50211 Malignant neoplasm of upper-inner quadrant of right female breast: Secondary | ICD-10-CM

## 2018-07-13 DIAGNOSIS — Z170 Estrogen receptor positive status [ER+]: Secondary | ICD-10-CM

## 2018-07-13 MED ORDER — EXEMESTANE 25 MG PO TABS: 25.0000 mg | ORAL_TABLET | Freq: Every day | ORAL | 3 refills | Status: AC

## 2018-07-13 NOTE — Progress Notes (Signed)
Reviewed anti estrogen therapy recommendations with patient after discussing with Dr. Jana Hakim.   I placed an order of Exemestane 25mg  daily for her to take.  Reviewed with Valeria in detail.  She verbalizes understanding of it.  She will see Dr. Jana Hakim in 3 months.  E scribed 90 day supply to [facility] at her request.  Wilber Bihari, NP

## 2018-07-18 ENCOUNTER — Other Ambulatory Visit: Payer: Self-pay | Admitting: Oncology

## 2018-07-18 DIAGNOSIS — Z853 Personal history of malignant neoplasm of breast: Secondary | ICD-10-CM

## 2018-08-08 DIAGNOSIS — M1990 Unspecified osteoarthritis, unspecified site: Secondary | ICD-10-CM | POA: Diagnosis not present

## 2018-08-08 DIAGNOSIS — I10 Essential (primary) hypertension: Secondary | ICD-10-CM | POA: Diagnosis not present

## 2018-08-08 DIAGNOSIS — E669 Obesity, unspecified: Secondary | ICD-10-CM | POA: Diagnosis not present

## 2018-08-08 DIAGNOSIS — E039 Hypothyroidism, unspecified: Secondary | ICD-10-CM | POA: Diagnosis not present

## 2018-08-08 DIAGNOSIS — I739 Peripheral vascular disease, unspecified: Secondary | ICD-10-CM | POA: Diagnosis not present

## 2018-08-08 DIAGNOSIS — G629 Polyneuropathy, unspecified: Secondary | ICD-10-CM | POA: Diagnosis not present

## 2018-08-08 DIAGNOSIS — Z809 Family history of malignant neoplasm, unspecified: Secondary | ICD-10-CM | POA: Diagnosis not present

## 2018-08-08 DIAGNOSIS — Z791 Long term (current) use of non-steroidal anti-inflammatories (NSAID): Secondary | ICD-10-CM | POA: Diagnosis not present

## 2018-08-08 DIAGNOSIS — G8929 Other chronic pain: Secondary | ICD-10-CM | POA: Diagnosis not present

## 2018-08-08 DIAGNOSIS — Z79891 Long term (current) use of opiate analgesic: Secondary | ICD-10-CM | POA: Diagnosis not present

## 2018-08-09 DIAGNOSIS — Z79899 Other long term (current) drug therapy: Secondary | ICD-10-CM | POA: Diagnosis not present

## 2018-08-09 DIAGNOSIS — M5416 Radiculopathy, lumbar region: Secondary | ICD-10-CM | POA: Diagnosis not present

## 2018-08-09 DIAGNOSIS — M5126 Other intervertebral disc displacement, lumbar region: Secondary | ICD-10-CM | POA: Diagnosis not present

## 2018-08-09 DIAGNOSIS — R69 Illness, unspecified: Secondary | ICD-10-CM | POA: Diagnosis not present

## 2018-08-16 DIAGNOSIS — H33103 Unspecified retinoschisis, bilateral: Secondary | ICD-10-CM | POA: Diagnosis not present

## 2018-08-16 DIAGNOSIS — H35372 Puckering of macula, left eye: Secondary | ICD-10-CM | POA: Diagnosis not present

## 2018-08-16 DIAGNOSIS — H04123 Dry eye syndrome of bilateral lacrimal glands: Secondary | ICD-10-CM | POA: Diagnosis not present

## 2018-08-16 DIAGNOSIS — H25013 Cortical age-related cataract, bilateral: Secondary | ICD-10-CM | POA: Diagnosis not present

## 2018-08-16 DIAGNOSIS — H2513 Age-related nuclear cataract, bilateral: Secondary | ICD-10-CM | POA: Diagnosis not present

## 2018-08-22 DIAGNOSIS — E039 Hypothyroidism, unspecified: Secondary | ICD-10-CM | POA: Diagnosis not present

## 2018-09-05 DIAGNOSIS — M170 Bilateral primary osteoarthritis of knee: Secondary | ICD-10-CM | POA: Diagnosis not present

## 2018-09-05 DIAGNOSIS — C50919 Malignant neoplasm of unspecified site of unspecified female breast: Secondary | ICD-10-CM | POA: Diagnosis not present

## 2018-09-05 DIAGNOSIS — J45991 Cough variant asthma: Secondary | ICD-10-CM | POA: Diagnosis not present

## 2018-09-05 DIAGNOSIS — Z853 Personal history of malignant neoplasm of breast: Secondary | ICD-10-CM | POA: Diagnosis not present

## 2018-09-05 DIAGNOSIS — I10 Essential (primary) hypertension: Secondary | ICD-10-CM | POA: Diagnosis not present

## 2018-09-05 DIAGNOSIS — E039 Hypothyroidism, unspecified: Secondary | ICD-10-CM | POA: Diagnosis not present

## 2018-09-09 DIAGNOSIS — Z96651 Presence of right artificial knee joint: Secondary | ICD-10-CM | POA: Diagnosis not present

## 2018-09-09 DIAGNOSIS — Z471 Aftercare following joint replacement surgery: Secondary | ICD-10-CM | POA: Diagnosis not present

## 2018-09-20 ENCOUNTER — Other Ambulatory Visit: Payer: Medicare HMO

## 2018-09-21 ENCOUNTER — Encounter: Payer: Self-pay | Admitting: General Practice

## 2018-09-21 NOTE — Progress Notes (Signed)
[facility] Team contacted patient to assess for food insecurity and other psychosocial needs during current COVID19 pandemic.  "We have all we need, great support system."    Patient/family expressed no needs at this time.  Support Team member encouraged patient to call if changes occur or they have any other questions/concerns.    Beverely Pace, [place]

## 2018-09-26 ENCOUNTER — Other Ambulatory Visit: Payer: Self-pay | Admitting: Oncology

## 2018-09-26 DIAGNOSIS — E2839 Other primary ovarian failure: Secondary | ICD-10-CM | POA: Diagnosis not present

## 2018-09-26 DIAGNOSIS — M170 Bilateral primary osteoarthritis of knee: Secondary | ICD-10-CM | POA: Diagnosis not present

## 2018-09-26 DIAGNOSIS — E039 Hypothyroidism, unspecified: Secondary | ICD-10-CM | POA: Diagnosis not present

## 2018-09-26 DIAGNOSIS — I700 Atherosclerosis of aorta: Secondary | ICD-10-CM | POA: Diagnosis not present

## 2018-09-26 DIAGNOSIS — Z170 Estrogen receptor positive status [ER+]: Secondary | ICD-10-CM

## 2018-09-26 DIAGNOSIS — Z853 Personal history of malignant neoplasm of breast: Secondary | ICD-10-CM | POA: Diagnosis not present

## 2018-09-26 DIAGNOSIS — M5126 Other intervertebral disc displacement, lumbar region: Secondary | ICD-10-CM | POA: Diagnosis not present

## 2018-09-26 DIAGNOSIS — D709 Neutropenia, unspecified: Secondary | ICD-10-CM | POA: Diagnosis not present

## 2018-09-26 DIAGNOSIS — I10 Essential (primary) hypertension: Secondary | ICD-10-CM | POA: Diagnosis not present

## 2018-09-26 DIAGNOSIS — C50512 Malignant neoplasm of lower-outer quadrant of left female breast: Secondary | ICD-10-CM

## 2018-09-26 DIAGNOSIS — C50211 Malignant neoplasm of upper-inner quadrant of right female breast: Secondary | ICD-10-CM

## 2018-09-26 DIAGNOSIS — Z0000 Encounter for general adult medical examination without abnormal findings: Secondary | ICD-10-CM | POA: Diagnosis not present

## 2018-10-04 ENCOUNTER — Telehealth: Payer: Self-pay | Admitting: Oncology

## 2018-10-04 NOTE — Telephone Encounter (Signed)
R/s appt per 4/13 sch message - pt is aware of appt date and time   

## 2018-10-07 DIAGNOSIS — I10 Essential (primary) hypertension: Secondary | ICD-10-CM | POA: Diagnosis not present

## 2018-10-10 ENCOUNTER — Ambulatory Visit: Payer: Medicare HMO | Admitting: Oncology

## 2018-10-10 ENCOUNTER — Other Ambulatory Visit: Payer: Medicare HMO

## 2018-10-12 ENCOUNTER — Telehealth: Payer: Self-pay | Admitting: Oncology

## 2018-10-12 NOTE — Telephone Encounter (Signed)
Spoke with the patient and she already had the webex app downloaded onto her computer and her ipad. Patient received the email with the appointment information.

## 2018-10-13 NOTE — Progress Notes (Signed)
[facility]  [phone] Fax:([phone]) [phone]    ID: CRISTIAN GRIEVES DOB: [birth year]  MR#: [id number]  XQJ#:[id number]  Patient Care Team: Maury Dus, MD as PCP - General (Family Medicine) Excell Seltzer, MD as Consulting Physician (General Surgery) Magrinat, Virgie Dad, MD as Consulting Physician (Oncology) Maisie Fus, MD as Consulting Physician (Obstetrics and Gynecology) Kennith Center, RD as Dietitian (Family Medicine) Marlaine Hind, MD as Consulting Physician (Physical Medicine and Rehabilitation) Gaynelle Arabian, MD as Consulting Physician (Orthopedic Surgery)   CHIEF COMPLAINT: Ductal carcinoma in situ, estrogen receptor positive  CURRENT TREATMENT: Exemestane   I connected with Maia Plan on 10/14/18 at  9:30 AM EDT by telephone visit and verified that I am speaking with the correct person using two identifiers.   I discussed the limitations, risks, security and privacy concerns of performing an evaluation and management service by telemedicine and the availability of in-person appointments. I also discussed with the patient that there may be a patient responsible charge related to this service. The patient expressed understanding and agreed to proceed.   Other persons participating in the visit and their role in the encounter:   -Biomedical scientist, Medical Scribe   Patient's location: home  Provider's location: [facility]   Chief Complaint: Ductal carcinoma in situ, estrogen receptor positive    BREAST CANCER HISTORY: From the original intake note:  [facility] showing a possible mass in her left breast. On 01/05/2014 left diagnostic mammography and ultrasonography at the breast Center showed 2 adjacent poorly defined masses deep in the lower outer portion of the left breast. On physical exam these were palpable measuring approximately 3 cm altogether, at the 4:00  position 16 cm from the left nipple. There were no palpable left axillary lymph nodes. Ultrasound confirmed to and adjacent irregularly marginated hypoechoic masses in the area in question, measuring 1.5 and 1.0 cm respectively. The total area of by ultrasonography measured 2.6 cm. In addition, to left axillary lymph nodes showed mild cortical thickening in one of them showed loss of the normal fatty hilum.  On 01/10/2014 the patient underwent separate biopsies of both the left breast masses in question as well as the more suspicious lymph node. The final pathology ([id number]) showed the lymph node to be benign. (This was interpreted as concordant). Both of the breast masses, however, were positive for an invasive ductal carcinoma, which was HER-2 not amplified, with the signals ratio of 0.98 and the number per cell being 2.45. Estrogen and progesterone receptor determination is pending but looks negative at this point.  On 01/16/2014 the patient underwent bilateral breast MRI the right breast was unremarkable. In the left breast lower outer quadrant there were 2 oval masses measuring 1.5 and 2.2 cm. Taken together they was approximately 4 cm of disease. There were no findings of concern in the right breast. In the left axilla there was a single mildly prominent lymph node. There was no suspicious internal mammary adenopathy.  The patient's subsequent history is as detailed below.    INTERVAL HISTORY: Amen was seen today for follow-up and treatment of her essentially triple negative breast cancer (very weak estrogen positivity) and her estrogen receptor positive ductal carcinoma in situ.   She has been on exemestane. Jenina notes some arthralgia; she is having pain in her hips and left wrist primarily. In her wrist, she says that it feels like she has some slipping in her joint.  She did not have these problems at least not all these problems until she started the exemestane.  Graciella's last bone  density screening on 09/18/2016, showed a T-score of 0.2, which is considered normal.    Since her last visit here, she has not undergone any additional studies. She is scheduled for a bone density screening and a mammogram on 12/19/2018.     REVIEW OF SYSTEMS: As part of COVID-19 precautions, she has been avoiding going out as much as possible. She has a thorough cleaning routine for items that may be contaminated. The patient denies unusual headaches, visual changes, nausea, vomiting, or dizziness. There has been no unusual cough, phlegm production, or pleurisy. This been no change in bowel or bladder habits. The patient denies unexplained fatigue or unexplained weight loss, bleeding, rash, or fever. A detailed review of systems was otherwise noncontributory.    PAST MEDICAL HISTORY: Past Medical History:  Diagnosis Date  . Allergy codeine   rapid heart beat, cold sweat  . Aortic atherosclerosis ([place])   . Arthritis   . Breast cancer ([place]) 01/10/14   Left breast INVASIVE DUCTAL CARCINOMA STAGE 2  . Breast cancer ([place]) 2018   Right breast   . DDD (degenerative disc disease), lumbosacral   . DJD (degenerative joint disease)   . Grade I diastolic dysfunction 22/48/2500   noted on ECHO   . History of cardiomegaly 02/12/2014   Noted on CXR  . Hypertension   . Hyperthyroidism    medication called in today for hyperthroid  . LVH (left ventricular hypertrophy) 04/03/2014   Mild, noted on ECHO   . OA (osteoarthritis)   . Personal history of chemotherapy 2015   Left Breast Cancer  . Personal history of radiation therapy 2015   Left Breast Cancer  . Personal history of radiation therapy 2018   Right breast cancer   . PONV (postoperative nausea and vomiting)   . S/P radiation therapy 09/21/14 completed   left breast  . Spinal stenosis    Moderate    PAST SURGICAL HISTORY: Past Surgical History:  Procedure Laterality Date  . ABDOMINAL HYSTERECTOMY  1992  . BREAST LUMPECTOMY Left  02/12/2014  . BREAST LUMPECTOMY Right 2018  . BREAST LUMPECTOMY WITH NEEDLE LOCALIZATION AND AXILLARY SENTINEL LYMPH NODE BX Left 02/12/2014   Procedure: LEFT BREAST LUMPECTOMY WITH NEEDLE LOCALIZATION AND LEFT AXILLARY SENTINEL LYMPH NODE BX;  Surgeon: [name], MD;  Location: [facility];  Service: General;  Laterality: Left;  . BREAST LUMPECTOMY WITH RADIOACTIVE SEED LOCALIZATION Right 04/02/2017   Procedure: BREAST LUMPECTOMY WITH RADIOACTIVE SEED LOCALIZATION;  Surgeon: Excell Seltzer, MD;  Location: [facility];  Service: General;  Laterality: Right;  . COLONOSCOPY    . FOOT ARTHROPLASTY  1998   right  . KNEE ARTHROSCOPY  2012   right  . LUMBAR LAMINECTOMY  1991  . PORTACATH PLACEMENT Right 02/12/2014   Procedure: INSERTION PORT-A-CATH;  Surgeon: [name], MD;  Location: [facility];  Service: General;  Laterality: Right;  . portacath removal    . TOTAL KNEE ARTHROPLASTY Left 09/20/2017   Procedure: LEFT TOTAL KNEE ARTHROPLASTY;  Surgeon: Gaynelle Arabian, MD;  Location: [facility];  Service: Orthopedics;  Laterality: Left;  Adductor Block  . TOTAL KNEE ARTHROPLASTY Right 03/21/2018   Procedure: RIGHT TOTAL KNEE ARTHROPLASTY;  Surgeon: Gaynelle Arabian, MD;  Location: [facility];  Service: Orthopedics;  Laterality: Right;    FAMILY HISTORY Family History  Problem Relation Age  of Onset  . Cancer Sister   . Cancer Brother   . Cancer Maternal Aunt   . Cancer Paternal Aunt    the patient's father died at the age of 50 from a stroke. The patient's mother died at the age of 67 from "multiple causes". The patient had 2 brothers and 2 sisters. The patient's sister was diagnosed with uterine cancer at the age of 52, and the patient's brother [name] had "bone cancer" and died in his 6s. He was treated at the  [facility]. There is no history of breast or ovarian cancer in the family to the patient's knowledge.    GYNECOLOGIC HISTORY:  No LMP recorded. Patient has had a hysterectomy. Menarche age 25, the patient is GX P0. She underwent hysterectomy in 1992, with no salpingo-oophorectomy. She has been on estrogen replacement since. 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 Her husband [name] worked for [employer] in [place] as a Facilities manager. The patient's adopted son [name] works as a laborer in [place]. The patient's god-daughter [name] is an Optometrist. The patient has no grandchildren. The patient attends a local church of  [place], and her husband [name] is pastor   ADVANCED DIRECTIVES: In place   HEALTH MAINTENANCE: Social History   Tobacco Use  . Smoking status: Never Smoker  . Smokeless tobacco: Never Used  Substance Use Topics  . Alcohol use: No  . Drug use: No     Colonoscopy: October 2008  PAP: July 2015  Bone density: 01/01/2014  Lipid panel:  Allergies  Allergen Reactions  . Codeine Palpitations and Other (See Comments)    Heart rate increased, cold sweats   . Adhesive [Tape]     Pulls off skin  . Tramadol Nausea And Vomiting    Upset stomach    Current Outpatient Medications  Medication Sig Dispense Refill  . amLODipine (NORVASC) 5 MG tablet Take 5 mg by mouth daily.    [name] apixaban (ELIQUIS) 2.5 MG TABS tablet Take 1 tablet (2.5 mg total) by mouth 2 (two) times daily for 19 days. Then take one 81 mg aspirin once a day for three weeks. Then discontinue aspirin. 38 tablet 0  . Cholecalciferol (VITAMIN D-3 PO) Vitamin D3    . Cholecalciferol 50 MCG (2000 UT) CAPS Take by mouth.    . cloNIDine (CATAPRES-TTS-1) 0.1 mg/24hr patch Place 1 patch (0.1 mg total) onto the skin once a week. 12 patch 3  . cyclobenzaprine (FLEXERIL) 10 MG tablet Take 1 tablet (10 mg total) by mouth 3 (three) times daily as needed for muscle spasms.  (Patient not taking: Reported on 07/08/2018) 40 tablet 0  . diclofenac sodium (VOLTAREN) 1 % GEL Apply 1 application topically as needed (knee pain).    [name] diphenhydrAMINE (BENADRYL) 25 MG tablet Take 50 mg by mouth at bedtime.    Mariane Baumgarten Sodium (COLACE PO) Take 3 capsules by mouth daily.    [name] docusate sodium (STOOL SOFTENER) 100 MG capsule Stool Softener    . exemestane (AROMASIN) 25 MG tablet Take 1 tablet (25 mg total) by mouth daily after breakfast. 90 tablet 3  . gabapentin (NEURONTIN) 300 MG capsule Take 1 capsule (300 mg total) by mouth at bedtime. 90 capsule 0  . Ginkgo Biloba 120 MG TABS Take by mouth.    . levothyroxine (SYNTHROID, LEVOTHROID) 75 MCG  tablet Take 75 mcg by mouth daily before breakfast.    . meloxicam (MOBIC) 15 MG tablet     . Naphazoline HCl (CLEAR EYES OP) Place 1 drop into both eyes daily as needed (dry eyes).    [name] oxyCODONE (OXY IR/ROXICODONE) 5 MG immediate release tablet Take 1-2 tablets (5-10 mg total) by mouth every 6 (six) hours as needed for moderate pain (pain score 4-6). 56 tablet 0  . valsartan-hydrochlorothiazide (DIOVAN-HCT) 320-25 MG per tablet Take 1 tablet by mouth daily.     No current facility-administered medications for this visit.     OBJECTIVE: Middle-aged African-American woman in no acute distress There were no vitals filed for this visit.   There is no height or weight on file to calculate BMI.    ECOG FS:1 - Symptomatic but completely ambulatory   LAB RESULTS:  CMP     Component Value Date/Time   NA 142 07/08/2018 1319   NA 142 04/26/2017 1126   K 3.4 (L) 07/08/2018 1319   K 4.0 04/26/2017 1126   CL 106 07/08/2018 1319   CO2 29 07/08/2018 1319   CO2 28 04/26/2017 1126   GLUCOSE 100 (H) 07/08/2018 1319   GLUCOSE 90 04/26/2017 1126   BUN 22 07/08/2018 1319   BUN 18.0 04/26/2017 1126   CREATININE 0.97 07/08/2018 1319   CREATININE 0.9 04/26/2017 1126   CALCIUM 9.5 07/08/2018 1319   CALCIUM 9.9 04/26/2017 1126   PROT 7.2  07/08/2018 1319   PROT 7.5 04/26/2017 1126   ALBUMIN 3.5 07/08/2018 1319   ALBUMIN 3.6 04/26/2017 1126   AST 20 07/08/2018 1319   AST 21 04/26/2017 1126   ALT 17 07/08/2018 1319   ALT 15 04/26/2017 1126   ALKPHOS 127 (H) 07/08/2018 1319   ALKPHOS 99 04/26/2017 1126   BILITOT 0.3 07/08/2018 1319   BILITOT 0.52 04/26/2017 1126   GFRNONAA 59 (L) 07/08/2018 1319   GFRAA >60 07/08/2018 1319    I No results found for: SPEP  Lab Results  Component Value Date   WBC 3.4 (L) 07/08/2018   NEUTROABS 2.0 07/08/2018   HGB 12.1 07/08/2018   HCT 38.3 07/08/2018   MCV 89.7 07/08/2018   PLT 238 07/08/2018      Chemistry      Component Value Date/Time   NA 142 07/08/2018 1319   NA 142 04/26/2017 1126   K 3.4 (L) 07/08/2018 1319   K 4.0 04/26/2017 1126   CL 106 07/08/2018 1319   CO2 29 07/08/2018 1319   CO2 28 04/26/2017 1126   BUN 22 07/08/2018 1319   BUN 18.0 04/26/2017 1126   CREATININE 0.97 07/08/2018 1319   CREATININE 0.9 04/26/2017 1126      Component Value Date/Time   CALCIUM 9.5 07/08/2018 1319   CALCIUM 9.9 04/26/2017 1126   ALKPHOS 127 (H) 07/08/2018 1319   ALKPHOS 99 04/26/2017 1126   AST 20 07/08/2018 1319   AST 21 04/26/2017 1126   ALT 17 07/08/2018 1319   ALT 15 04/26/2017 1126   BILITOT 0.3 07/08/2018 1319   BILITOT 0.52 04/26/2017 1126       No results found for: LABCA2  No components found for: LABCA125  No results for input(s): INR in the last 168 hours.  Urinalysis    Component Value Date/Time   COLORURINE AMBER (A) 01/16/2016 1751    STUDIES: No results found.   ASSESSMENT: 73 y.o. [place] woman status post left breast lower outer quadrant biopsy of 2 separate lower  outer quadrant masses 01/10/2014 for a multifocal cT2 cNX, stage II invasive ductal carcinoma, grade 3, with an MIB-1 of 90%, HER-2 negative, estrogen receptors 3% positive with weak staining, progesterone receptor negative, with an MIB-1 of 90%; this is clinically a  "triple-negative" breast cancer and will be treated as such  (1) a suspicious left axillary lymph node biopsied 01/10/2014 was negative (concordant)  (2) left lumpectomy and sentinel lymph node sampling 02/12/2014 showed an mpT2 pN0, stage IIA invasive ductal carcinoma grade 3, repeat HER-2 again negative  (3) adjuvant chemotherapy consisted of  (a) dose dense doxorubicin and cyclophosphamide x4 completed 05/02/2014  (b) weekly paclitaxel and carboplatin with 3 of 12 doses given, then stopped because of neuropathy  (c) Carboplatin and Gemzar started 06/11/2014, given on day 1 & day 8 of a 21 days cycle for 3 cycles, completed 07/30/2014  (4) adjuvant radiation 08/27/2014 through 09/21/2014:  The patient initially received a dose of 42.5 Gy in 17 fractions to the left breast using whole-breast tangent fields. This was delivered using a 3-D conformal technique. The patient then received a boost to the seroma. This delivered an additional 7.5 Gy in 3 fractions using a 3 field photon technique due to the depth of the seroma. The total dose was 50 Gy.  (5) tamoxifen started (prophylactically) in December 2016; stopped after 2 weeks due to side effects.   (a) given the very weak and low estrogen receptor positivity, resumption of anti-estrogens was not anticipated  (6) right breast upper outer quadrant biopsy 04/16/2016 showed no evidence of malignancy  (7) right lumpectomy April 02, 2017 showed ductal carcinoma in situ, grade 1, measuring 0.2 cm, with negative margins.  This was estrogen receptor 100% positive and progesterone receptor 95% positive.  (8) anastrozole started 05/03/2017, stopped in 03/2018 due to arthralgias  (a) bone density at the breast center 09/18/2016 showed a T score of 0.2 (NORMAL)  (9) adjuvant radiation12/12/18-01/10/19 Site/dose:  Right breast/ 42.5 Gy in 17 fractions  (10) exemestane started January 2020, discontinued late April 2020 with arthralgias/myalgias     PLAN:  Karinne initially did very well with the exemestane but eventually did develop significant arthralgias and myalgias which do seem by her story to be related to the medication.  Accordingly we are stopping exemestane at this point.  I have asked her to write down all the symptoms she is having right now.  She will have mammography and a bone density in June and then I will see her again in mid July.  At that point if all those symptoms persist we will go back to exemestane but otherwise very likely we will go off antiestrogens although we could consider toremifene or fulvestrant if she is very motivated  She is taking appropriate pandemic precautions  She knows to call for any other issues that may develop before the next visit.   Magrinat, Virgie Dad, MD  10/14/18 9:52 AM Medical Oncology and Hematology [facility] [address] [place], [place] [address] Tel. [phone]    Fax. [phone]  I, Jacqualyn Posey am acting as a Education administrator for Chauncey Cruel, MD.   I, Lurline Del MD, have reviewed the above documentation for accuracy and completeness, and I agree with the above.

## 2018-10-14 ENCOUNTER — Inpatient Hospital Stay: Payer: Medicare HMO | Attending: Oncology | Admitting: Oncology

## 2018-10-14 DIAGNOSIS — C50512 Malignant neoplasm of lower-outer quadrant of left female breast: Secondary | ICD-10-CM

## 2018-10-17 ENCOUNTER — Telehealth: Payer: Self-pay | Admitting: Oncology

## 2018-10-17 NOTE — Telephone Encounter (Signed)
Called regarding schedule °

## 2018-10-27 DIAGNOSIS — C50919 Malignant neoplasm of unspecified site of unspecified female breast: Secondary | ICD-10-CM | POA: Diagnosis not present

## 2018-10-27 DIAGNOSIS — M170 Bilateral primary osteoarthritis of knee: Secondary | ICD-10-CM | POA: Diagnosis not present

## 2018-10-27 DIAGNOSIS — I10 Essential (primary) hypertension: Secondary | ICD-10-CM | POA: Diagnosis not present

## 2018-10-27 DIAGNOSIS — J45991 Cough variant asthma: Secondary | ICD-10-CM | POA: Diagnosis not present

## 2018-10-27 DIAGNOSIS — E039 Hypothyroidism, unspecified: Secondary | ICD-10-CM | POA: Diagnosis not present

## 2018-10-31 ENCOUNTER — Other Ambulatory Visit: Payer: Medicare HMO

## 2018-11-11 DIAGNOSIS — Z20828 Contact with and (suspected) exposure to other viral communicable diseases: Secondary | ICD-10-CM | POA: Diagnosis not present

## 2018-11-16 ENCOUNTER — Ambulatory Visit: Payer: Medicare HMO | Admitting: Oncology

## 2018-11-16 ENCOUNTER — Other Ambulatory Visit: Payer: Medicare HMO

## 2018-11-17 DIAGNOSIS — Z79899 Other long term (current) drug therapy: Secondary | ICD-10-CM | POA: Diagnosis not present

## 2018-11-17 DIAGNOSIS — R69 Illness, unspecified: Secondary | ICD-10-CM | POA: Diagnosis not present

## 2018-11-17 DIAGNOSIS — M5416 Radiculopathy, lumbar region: Secondary | ICD-10-CM | POA: Diagnosis not present

## 2018-11-17 DIAGNOSIS — M5126 Other intervertebral disc displacement, lumbar region: Secondary | ICD-10-CM | POA: Diagnosis not present

## 2018-11-25 DIAGNOSIS — Z853 Personal history of malignant neoplasm of breast: Secondary | ICD-10-CM | POA: Diagnosis not present

## 2018-11-25 DIAGNOSIS — J45991 Cough variant asthma: Secondary | ICD-10-CM | POA: Diagnosis not present

## 2018-11-25 DIAGNOSIS — E039 Hypothyroidism, unspecified: Secondary | ICD-10-CM | POA: Diagnosis not present

## 2018-11-25 DIAGNOSIS — M170 Bilateral primary osteoarthritis of knee: Secondary | ICD-10-CM | POA: Diagnosis not present

## 2018-11-25 DIAGNOSIS — C50919 Malignant neoplasm of unspecified site of unspecified female breast: Secondary | ICD-10-CM | POA: Diagnosis not present

## 2018-11-25 DIAGNOSIS — I10 Essential (primary) hypertension: Secondary | ICD-10-CM | POA: Diagnosis not present

## 2018-12-04 ENCOUNTER — Other Ambulatory Visit: Payer: Self-pay | Admitting: Oncology

## 2018-12-19 ENCOUNTER — Ambulatory Visit
Admission: RE | Admit: 2018-12-19 | Discharge: 2018-12-19 | Disposition: A | Discharge status: Home / Self Care | Payer: Medicare HMO | Source: Ambulatory Visit | Attending: Oncology | Admitting: Oncology

## 2018-12-19 ENCOUNTER — Other Ambulatory Visit: Payer: Self-pay

## 2018-12-19 DIAGNOSIS — C50211 Malignant neoplasm of upper-inner quadrant of right female breast: Secondary | ICD-10-CM

## 2018-12-19 DIAGNOSIS — Z170 Estrogen receptor positive status [ER+]: Secondary | ICD-10-CM

## 2018-12-19 DIAGNOSIS — R928 Other abnormal and inconclusive findings on diagnostic imaging of breast: Secondary | ICD-10-CM | POA: Diagnosis not present

## 2018-12-19 DIAGNOSIS — Z853 Personal history of malignant neoplasm of breast: Secondary | ICD-10-CM

## 2018-12-19 DIAGNOSIS — Z780 Asymptomatic menopausal state: Secondary | ICD-10-CM | POA: Diagnosis not present

## 2018-12-19 DIAGNOSIS — C50512 Malignant neoplasm of lower-outer quadrant of left female breast: Secondary | ICD-10-CM

## 2018-12-19 DIAGNOSIS — Z13820 Encounter for screening for osteoporosis: Secondary | ICD-10-CM | POA: Diagnosis not present

## 2018-12-22 ENCOUNTER — Telehealth: Payer: Self-pay | Admitting: Oncology

## 2018-12-22 NOTE — Telephone Encounter (Signed)
[facility] PAL 7/21 f/u moved to 7/31 as webex. Left message. Schedule mailed.

## 2019-01-10 ENCOUNTER — Other Ambulatory Visit: Payer: Medicare HMO

## 2019-01-10 ENCOUNTER — Ambulatory Visit: Payer: Medicare HMO | Admitting: Oncology

## 2019-01-19 ENCOUNTER — Telehealth: Payer: Self-pay | Admitting: Oncology

## 2019-01-19 NOTE — Progress Notes (Signed)
Monica [name]  [phone] [phone]    ID: Monica [name] DOB: [birth year]  MR#: [id number]  URK#:[id number]  Patient Care Team: Maury Dus, MD as PCP - General (Family Medicine) Excell Seltzer, MD as Consulting Physician (General Surgery) Modesto Ganoe, Virgie Dad, MD as Consulting Physician (Oncology) Maisie Fus, MD as Consulting Physician (Obstetrics and Gynecology) Kennith Center, RD as Dietitian (Family Medicine) Marlaine Hind, MD as Consulting Physician (Physical Medicine and Rehabilitation) Gaynelle Arabian, MD as Consulting Physician (Orthopedic Surgery)   CHIEF COMPLAINT: Ductal carcinoma in situ, estrogen receptor positive  CURRENT TREATMENT: Observation   I connected with Monica [name] on 10/14/18 at 11:30 AM EDT by telephone visit and verified that I am speaking with the correct person using two identifiers.   I discussed the limitations, risks, security and privacy concerns of performing an evaluation and management service by telemedicine and the availability of in-person appointments. I also discussed with the patient that there may be a patient responsible charge related to this service. The patient expressed understanding and agreed to proceed.   Other persons participating in the visit and their role in the encounter:   -Biomedical scientist, Medical Scribe   Patients location: home  Providers location: [facility]   Chief Complaint: Ductal carcinoma in situ, estrogen receptor positive    BREAST CANCER HISTORY: From the original intake note:  Monica [name] [facility] showing a possible mass in her left breast. On 01/05/2014 left diagnostic mammography and ultrasonography at the breast Center showed 2 adjacent poorly defined masses deep in the lower outer portion of the left breast. On physical exam these were palpable measuring approximately 3 cm altogether, at the 4:00  position 16 cm from the left nipple. There were no palpable left axillary lymph nodes. Ultrasound confirmed to and adjacent irregularly marginated hypoechoic masses in the area in question, measuring 1.5 and 1.0 cm respectively. The total area of by ultrasonography measured 2.6 cm. In addition, to left axillary lymph nodes showed mild cortical thickening in one of them showed loss of the normal fatty hilum.  On 01/10/2014 the patient underwent separate biopsies of both the left breast masses in question as well as the more suspicious lymph node. The final pathology ([id number]) showed the lymph node to be benign. (This was interpreted as concordant). Both of the breast masses, however, were positive for an invasive ductal carcinoma, which was HER-2 not amplified, with the signals ratio of 0.98 and the number per cell being 2.45. Estrogen and progesterone receptor determination is pending but looks negative at this point.  On 01/16/2014 the patient underwent bilateral breast MRI the right breast was unremarkable. In the left breast lower outer quadrant there were 2 oval masses measuring 1.5 and 2.2 cm. Taken together they was approximately 4 cm of disease. There were no findings of concern in the right breast. In the left axilla there was a single mildly prominent lymph node. There was no suspicious internal mammary adenopathy.  The patient's subsequent history is as detailed below.    INTERVAL HISTORY: Monica [name] is contacted today for follow-up and treatment of her essentially triple negative breast cancer (very weak estrogen positivity) and her estrogen receptor positive ductal carcinoma in situ.  She continues under observation.   Monica [name]'s last bone density screening on 12/19/2018, showed a T-score of -0.4, which is considered normal.    Since her last visit here, she underwent a digital diagnostic bilateral mammogram with  tomography on 12/19/2018 showing: Breast Density Category C. There is no  mammographic evidence of malignancy.    REVIEW OF SYSTEMS: Monica [name] has been taking appropriate precautions against the spread of the virus. She requires others to wear a mask in her home if they visit to drop off groceries or other necessities. For exercise, she has been walking on the treadmill every other day. She notes that she has had a lot of dry skin and vaginal dryness. A detailed review of systems was otherwise noncontributory.    PAST MEDICAL HISTORY: Past Medical History:  Diagnosis Date   Allergy codeine   rapid heart beat, cold sweat   Aortic atherosclerosis (HCC)    Arthritis    Breast cancer ([place]) 01/10/14   Left breast INVASIVE DUCTAL CARCINOMA STAGE 2   Breast cancer ([place]) 2018   Right breast    DDD (degenerative disc disease), lumbosacral    DJD (degenerative joint disease)    Grade I diastolic dysfunction 51/70/0174   noted on ECHO    History of cardiomegaly 02/12/2014   Noted on CXR   Hypertension    Hyperthyroidism    medication called in today for hyperthroid   LVH (left ventricular hypertrophy) 04/03/2014   Mild, noted on ECHO    OA (osteoarthritis)    Personal history of chemotherapy 2015   Left Breast Cancer   Personal history of radiation therapy 2015   Left Breast Cancer   Personal history of radiation therapy 2018   Right breast cancer    PONV (postoperative nausea and vomiting)    S/P radiation therapy 09/21/14 completed   left breast   Spinal stenosis    Moderate    PAST SURGICAL HISTORY: Past Surgical History:  Procedure Laterality Date   ABDOMINAL HYSTERECTOMY  1992   BREAST LUMPECTOMY Left 02/12/2014   BREAST LUMPECTOMY Right 2018   BREAST LUMPECTOMY WITH NEEDLE LOCALIZATION AND AXILLARY SENTINEL LYMPH NODE BX Left 02/12/2014   Procedure: LEFT BREAST LUMPECTOMY WITH NEEDLE LOCALIZATION AND LEFT AXILLARY SENTINEL LYMPH NODE BX;  Surgeon: [name], MD;  Location: [facility];  Service: General;   Laterality: Left;   BREAST LUMPECTOMY WITH RADIOACTIVE SEED LOCALIZATION Right 04/02/2017   Procedure: BREAST LUMPECTOMY WITH RADIOACTIVE SEED LOCALIZATION;  Surgeon: Excell Seltzer, MD;  Location: [facility];  Service: General;  Laterality: Right;   COLONOSCOPY     FOOT ARTHROPLASTY  1998   right   KNEE ARTHROSCOPY  2012   right   [place] Right 02/12/2014   Procedure: INSERTION PORT-A-CATH;  Surgeon: [name], MD;  Location: [facility];  Service: General;  Laterality: Right;   portacath removal     TOTAL KNEE ARTHROPLASTY Left 09/20/2017   Procedure: LEFT TOTAL KNEE ARTHROPLASTY;  Surgeon: Gaynelle Arabian, MD;  Location: [facility];  Service: Orthopedics;  Laterality: Left;  Adductor Block   TOTAL KNEE ARTHROPLASTY Right 03/21/2018   Procedure: RIGHT TOTAL KNEE ARTHROPLASTY;  Surgeon: Gaynelle Arabian, MD;  Location: [facility];  Service: Orthopedics;  Laterality: Right;    FAMILY HISTORY Family History  Problem Relation Age of Onset   Cancer Sister    Cancer Brother    Cancer Maternal Aunt    Cancer Paternal Aunt    the patient's father died at the age of 82 from a stroke. The patient's mother died at the age of 53 from "multiple causes". The patient had 2 brothers and 2 sisters. The patient's  sister was diagnosed with uterine cancer at the age of 75, and the patient's brother [name] had "bone cancer" and died in his 36s. He was treated at the [facility]. There is no history of breast or ovarian cancer in the family to the patient's knowledge.   GYNECOLOGIC HISTORY:  No LMP recorded. Patient has had a hysterectomy. Menarche age 51, the patient is GX P0. She underwent hysterectomy in 1992, with no salpingo-oophorectomy. She has been on estrogen replacement since. 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 Her husband [name] worked for [employer] in [place] as a Facilities manager. The patient's adopted son [name] works as a laborer in [place]. The patient's god-daughter [name] is an Optometrist. The patient has no grandchildren. The patient attends a local church of  [place], and her husband [name] is pastor   ADVANCED DIRECTIVES: In place   HEALTH MAINTENANCE: Social History   Tobacco Use   Smoking status: Never Smoker   Smokeless tobacco: Never Used  Substance Use Topics   Alcohol use: No   Drug use: No     Colonoscopy: October 2008  PAP: July 2015  Bone density: 01/01/2014  Lipid panel:  Allergies  Allergen Reactions   Codeine Palpitations and Other (See Comments)    Heart rate increased, cold sweats    Adhesive [Tape]     Pulls off skin   Tramadol Nausea And Vomiting    Upset stomach    Current Outpatient Medications  Medication Sig Dispense Refill   amLODipine (NORVASC) 5 MG tablet Take 5 mg by mouth daily.     apixaban (ELIQUIS) 2.5 MG TABS tablet Take 1 tablet (2.5 mg total) by mouth 2 (two) times daily for 19 days. Then take one 81 mg aspirin once a day for three weeks. Then discontinue aspirin. 38 tablet 0   Cholecalciferol (VITAMIN D-3 PO) Vitamin D3     Cholecalciferol 50 MCG (2000 UT) CAPS Take by mouth.     cloNIDine (CATAPRES-TTS-1) 0.1 mg/24hr patch Place 1 patch (0.1 mg total) onto the skin once a week. 12 patch 3   cyclobenzaprine (FLEXERIL) 10 MG tablet Take 1 tablet (10 mg total) by mouth 3 (three) times daily as needed for muscle spasms. (Patient not taking: Reported on 07/08/2018) 40 tablet 0   diclofenac sodium (VOLTAREN) 1 % GEL Apply 1 application topically as needed (knee pain).     diphenhydrAMINE (BENADRYL) 25 MG tablet Take 50 mg by mouth at bedtime.     Docusate Sodium (COLACE PO) Take 3 capsules by mouth daily.     docusate sodium (STOOL SOFTENER) 100 MG  capsule Stool Softener     exemestane (AROMASIN) 25 MG tablet Take 1 tablet (25 mg total) by mouth daily after breakfast. 90 tablet 3   gabapentin (NEURONTIN) 300 MG capsule Take 1 capsule (300 mg total) by mouth at bedtime. 90 capsule 0   Ginkgo Biloba 120 MG TABS Take by mouth.     levothyroxine (SYNTHROID, LEVOTHROID) 75 MCG tablet Take 75 mcg by mouth daily before breakfast.     meloxicam (MOBIC) 15 MG tablet      Naphazoline HCl (CLEAR EYES OP) Place 1 drop into both eyes daily as needed (dry eyes).     oxyCODONE (OXY IR/ROXICODONE) 5 MG immediate release tablet Take 1-2 tablets (5-10 mg total) by mouth every  6 (six) hours as needed for moderate pain (pain score 4-6). 56 tablet 0   valsartan-hydrochlorothiazide (DIOVAN-HCT) 320-25 MG per tablet Take 1 tablet by mouth daily.     No current facility-administered medications for this visit.     OBJECTIVE: Middle-aged African-American woman in no acute distress There were no vitals filed for this visit.   There is no height or weight on file to calculate BMI.    ECOG FS:1 - Symptomatic but completely ambulatory  Telehealth Visit   LAB RESULTS:  CMP     Component Value Date/Time   NA 142 07/08/2018 1319   NA 142 04/26/2017 1126   K 3.4 (L) 07/08/2018 1319   K 4.0 04/26/2017 1126   CL 106 07/08/2018 1319   CO2 29 07/08/2018 1319   CO2 28 04/26/2017 1126   GLUCOSE 100 (H) 07/08/2018 1319   GLUCOSE 90 04/26/2017 1126   BUN 22 07/08/2018 1319   BUN 18.0 04/26/2017 1126   CREATININE 0.97 07/08/2018 1319   CREATININE 0.9 04/26/2017 1126   CALCIUM 9.5 07/08/2018 1319   CALCIUM 9.9 04/26/2017 1126   PROT 7.2 07/08/2018 1319   PROT 7.5 04/26/2017 1126   ALBUMIN 3.5 07/08/2018 1319   ALBUMIN 3.6 04/26/2017 1126   AST 20 07/08/2018 1319   AST 21 04/26/2017 1126   ALT 17 07/08/2018 1319   ALT 15 04/26/2017 1126   ALKPHOS 127 (H) 07/08/2018 1319   ALKPHOS 99 04/26/2017 1126   BILITOT 0.3 07/08/2018 1319   BILITOT 0.52  04/26/2017 1126   GFRNONAA 59 (L) 07/08/2018 1319   GFRAA >60 07/08/2018 1319    I No results found for: SPEP  Lab Results  Component Value Date   WBC 3.4 (L) 07/08/2018   NEUTROABS 2.0 07/08/2018   HGB 12.1 07/08/2018   HCT 38.3 07/08/2018   MCV 89.7 07/08/2018   PLT 238 07/08/2018      Chemistry      Component Value Date/Time   NA 142 07/08/2018 1319   NA 142 04/26/2017 1126   K 3.4 (L) 07/08/2018 1319   K 4.0 04/26/2017 1126   CL 106 07/08/2018 1319   CO2 29 07/08/2018 1319   CO2 28 04/26/2017 1126   BUN 22 07/08/2018 1319   BUN 18.0 04/26/2017 1126   CREATININE 0.97 07/08/2018 1319   CREATININE 0.9 04/26/2017 1126      Component Value Date/Time   CALCIUM 9.5 07/08/2018 1319   CALCIUM 9.9 04/26/2017 1126   ALKPHOS 127 (H) 07/08/2018 1319   ALKPHOS 99 04/26/2017 1126   AST 20 07/08/2018 1319   AST 21 04/26/2017 1126   ALT 17 07/08/2018 1319   ALT 15 04/26/2017 1126   BILITOT 0.3 07/08/2018 1319   BILITOT 0.52 04/26/2017 1126       No results found for: LABCA2  No components found for: LABCA125  No results for input(s): INR in the last 168 hours.  Urinalysis    Component Value Date/Time   COLORURINE AMBER (A) 01/16/2016 1751    STUDIES: No results found.   ASSESSMENT: 73 y.o. [place] woman status post left breast lower outer quadrant biopsy of 2 separate lower outer quadrant masses 01/10/2014 for a multifocal cT2 cNX, stage II invasive ductal carcinoma, grade 3, with an MIB-1 of 90%, HER-2 negative, estrogen receptors 3% positive with weak staining, progesterone receptor negative, with an MIB-1 of 90%; this is clinically a "triple-negative" breast cancer and will be treated as such  (1) a suspicious left axillary  lymph node biopsied 01/10/2014 was negative (concordant)  (2) left lumpectomy and sentinel lymph node sampling 02/12/2014 showed an mpT2 pN0, stage IIA invasive ductal carcinoma grade 3, repeat HER-2 again negative  (3) adjuvant  chemotherapy consisted of  (a) dose dense doxorubicin and cyclophosphamide x4 completed 05/02/2014  (b) weekly paclitaxel and carboplatin with 3 of 12 doses given, then stopped because of neuropathy  (c) Carboplatin and Gemzar started 06/11/2014, given on day 1 & day 8 of a 21 days cycle for 3 cycles, completed 07/30/2014  (4) adjuvant radiation 08/27/2014 through 09/21/2014:  The patient initially received a dose of 42.5 Gy in 17 fractions to the left breast using whole-breast tangent fields. This was delivered using a 3-D conformal technique. The patient then received a boost to the seroma. This delivered an additional 7.5 Gy in 3 fractions using a 3 field photon technique due to the depth of the seroma. The total dose was 50 Gy.  (5) tamoxifen started (prophylactically) in December 2016; stopped after 2 weeks due to side effects.   (a) given the very weak and low estrogen receptor positivity, resumption of anti-estrogens was not anticipated  (6) right breast upper outer quadrant biopsy 04/16/2016 showed no evidence of malignancy  (7) right lumpectomy April 02, 2017 showed ductal carcinoma in situ, grade 1, measuring 0.2 cm, with negative margins.  This was estrogen receptor 100% positive and progesterone receptor 95% positive.  (8) anastrozole started 05/03/2017, stopped in 03/2018 due to arthralgias  (a) bone density at the breast center 09/18/2016 showed a T score of 0.2 (NORMAL)  (b) repeat bone density 12/19/2018 showed a T score of -0.4, normal  (9) adjuvant radiation12/12/18-01/10/19 Site/dose:  Right breast/ 42.5 Gy in 17 fractions  (10) exemestane started January 2020, discontinued late April 2020 with arthralgias/myalgias    [name]:  Monica [name] is now just about 5 years out from definitive surgery for her original breast cancer, with no evidence of disease recurrence.  This is very favorable.  Recall that the right lumpectomy from October 2018 showed only an area of 2 mm of  ductal carcinoma in situ grade 1.  This really requires no further follow-up.  She was not able to tolerate antiestrogens, nevertheless she has a very good prognosis overall  At this point I am comfortable releasing her from follow-up here.  She will see her primary care physician and gynecologist for breast exam and she will continue to have mammography yearly  I will be glad to see Monica [name] again at any point in the future if and when the need arises but as of now are making no further routine appointments for her here.  Monica [name], Virgie Dad, MD  01/20/19 11:59 AM Medical Oncology and Hematology [facility] [address] [place], [place] [address] Tel. [phone]    Fax. [phone]  I, Jacqualyn Posey am acting as a Education administrator for Chauncey Cruel, MD.   I, Lurline Del MD, have reviewed the above documentation for accuracy and completeness, and I agree with the above.

## 2019-01-19 NOTE — Telephone Encounter (Signed)
Called patient regarding upcoming [facility] appointment, seen in past notes that patient used [facility] before so I sent an invite just in case. Left a voicemail, this may need to be a telephone visit.

## 2019-01-20 ENCOUNTER — Inpatient Hospital Stay: Payer: Medicare HMO | Attending: Oncology | Admitting: Oncology

## 2019-01-20 DIAGNOSIS — Z79811 Long term (current) use of aromatase inhibitors: Secondary | ICD-10-CM

## 2019-01-20 DIAGNOSIS — C50512 Malignant neoplasm of lower-outer quadrant of left female breast: Secondary | ICD-10-CM

## 2019-01-20 DIAGNOSIS — Z170 Estrogen receptor positive status [ER+]: Secondary | ICD-10-CM

## 2019-01-20 DIAGNOSIS — Z923 Personal history of irradiation: Secondary | ICD-10-CM

## 2019-01-22 IMAGING — [facility] DIGITAL DIAGNOSTIC BILATERAL MAMMOGRAM WITH TOMO AND CAD
6 of 10 series · 6 of 26 positions shown · non-contrast
Comparison: Previous exam(s).

CLINICAL DATA: Bilateral lumpectomies.  Annual mammography.

EXAM:
DIGITAL DIAGNOSTIC BILATERAL MAMMOGRAM WITH CAD AND TOMO

[R CC]
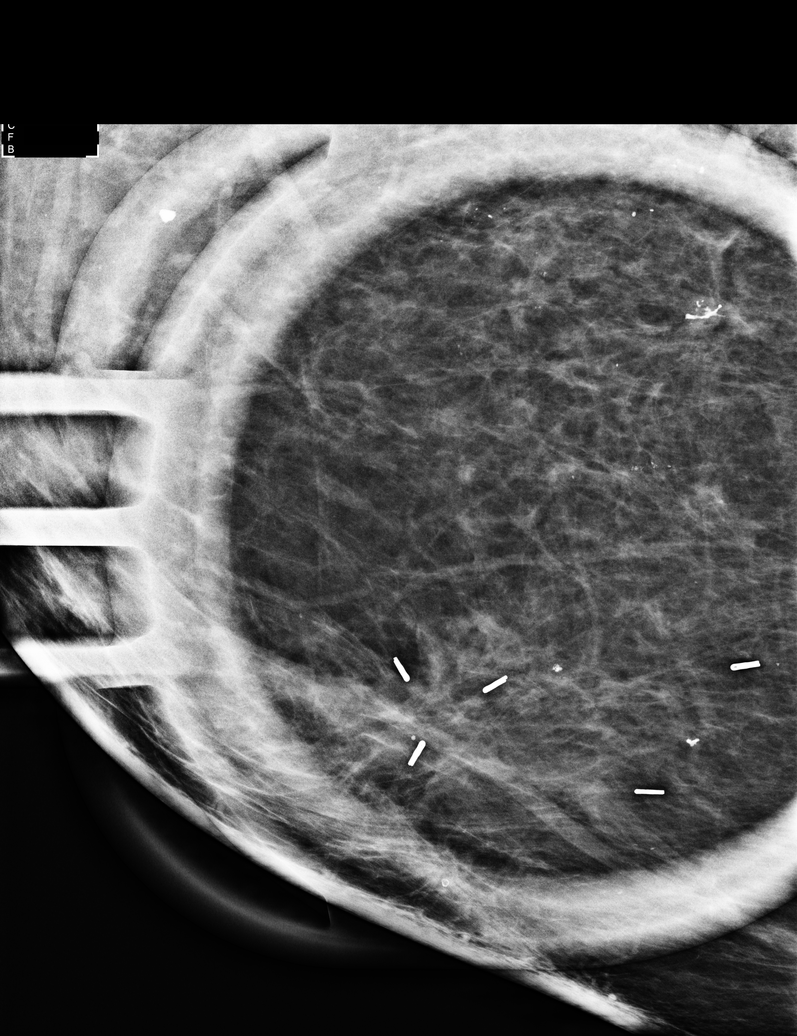

[L CC]
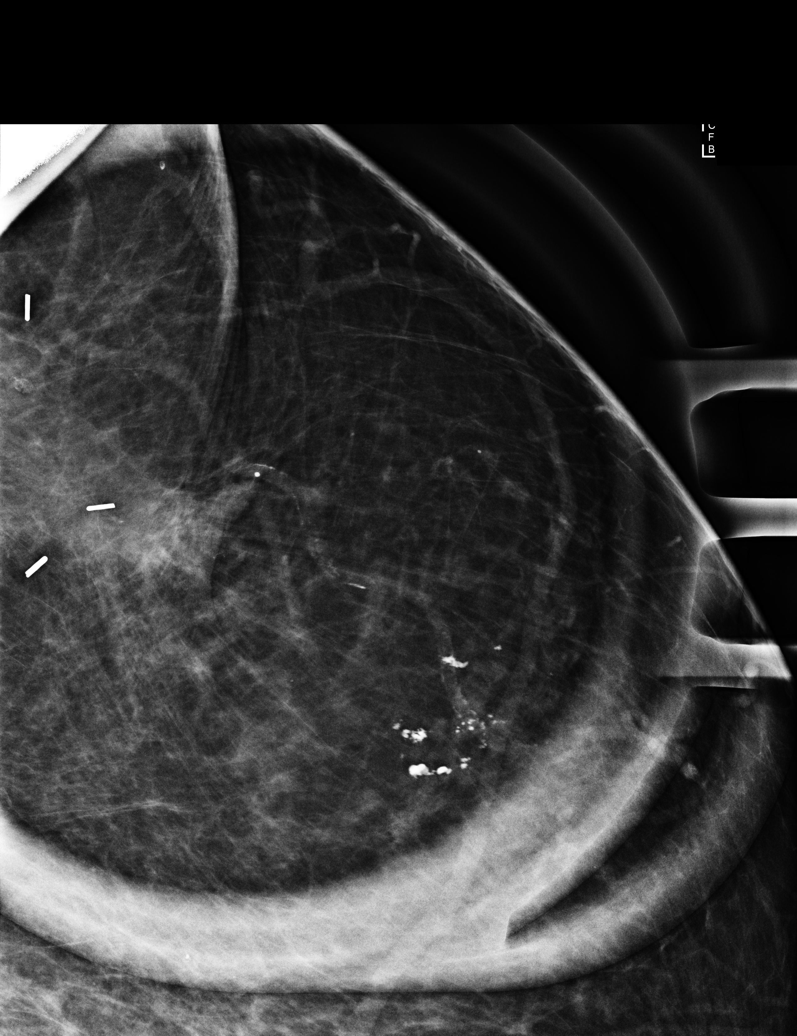

[L MLO synth-2D]
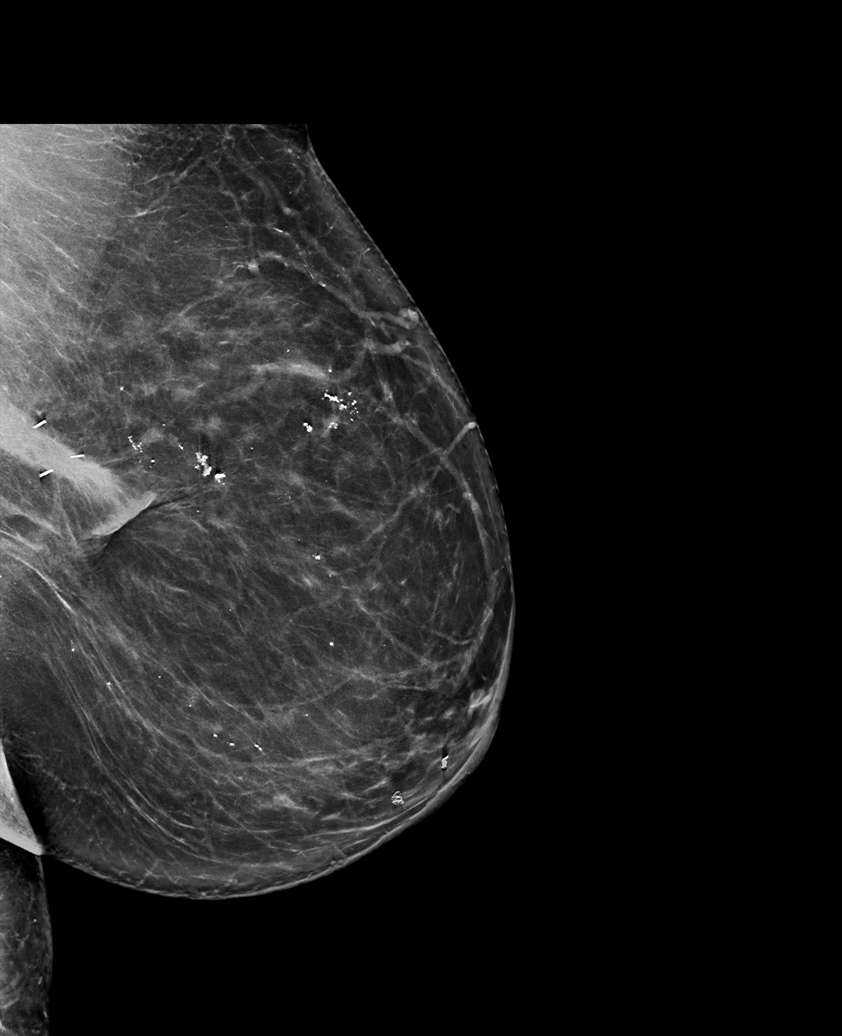

[R CC synth-2D]
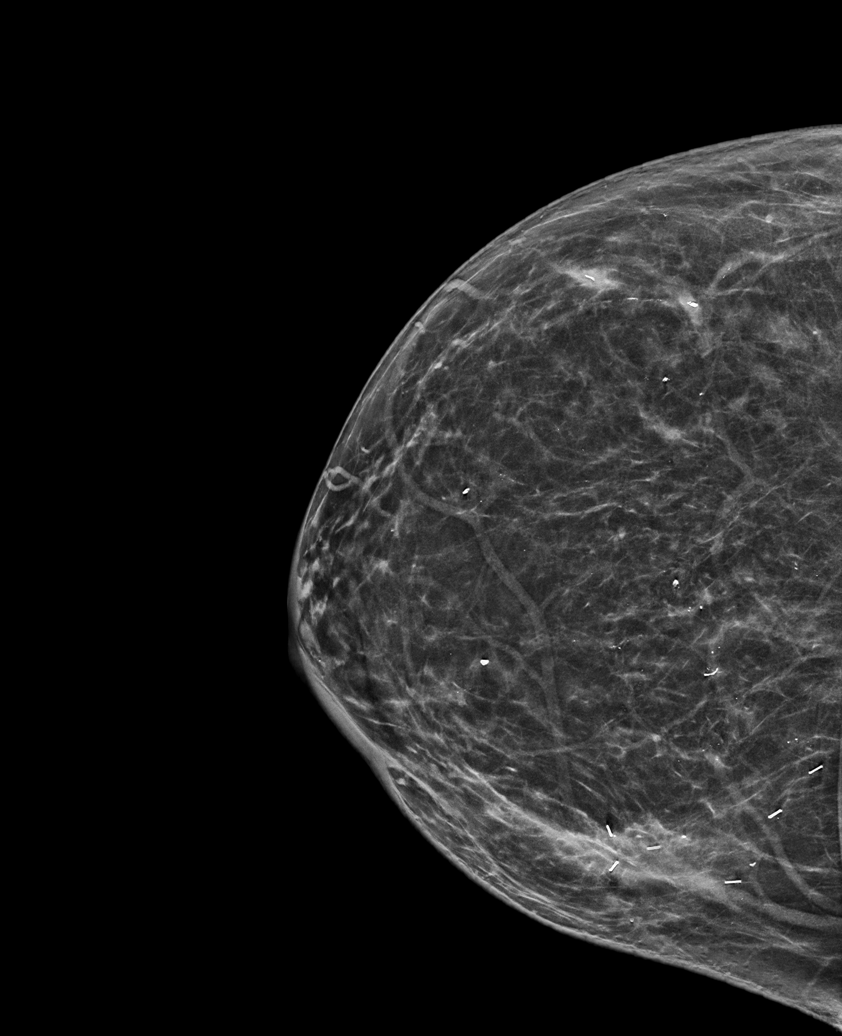

[R MLO synth-2D]
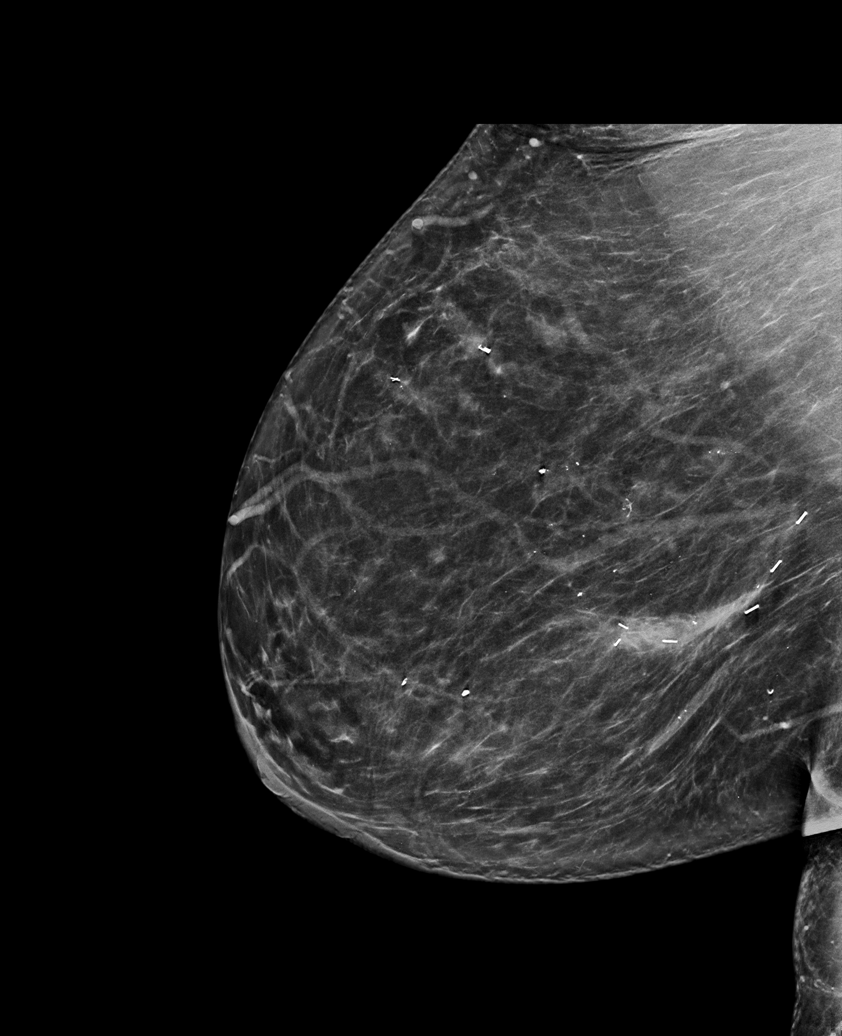

[L CC synth-2D]
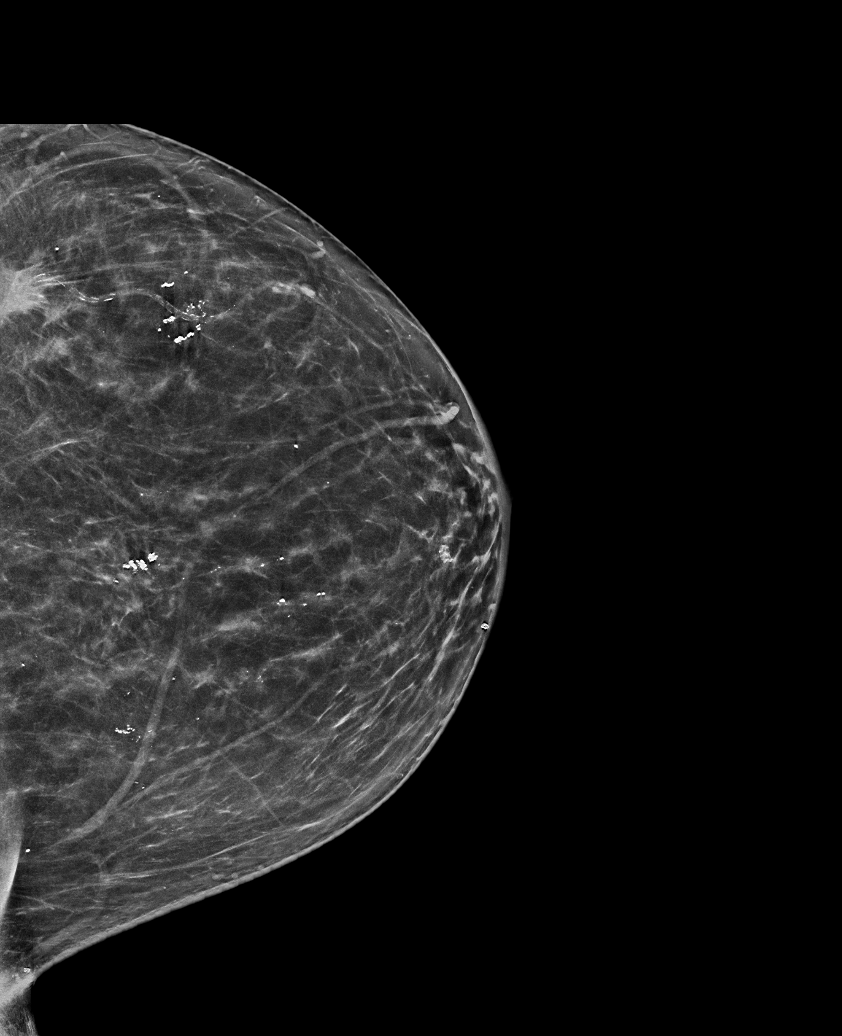

[6 of 26 positions shown; findings below may reference images not displayed]

ACR Breast Density Category b: There are scattered areas of
fibroglandular density.
FINDINGS: Bilateral lumpectomy sites are stable. No suspicious masses,
calcifications, or distortion.

Mammographic images were processed with CAD.
IMPRESSION: No mammographic evidence of malignancy.

RECOMMENDATION:
Annual diagnostic mammography.

I have discussed the findings and recommendations with the patient.
Results were also provided in writing at the conclusion of the
visit. If applicable, a reminder letter will be sent to the patient
regarding the next appointment.

BI-RADS CATEGORY  2: Benign.

## 2019-02-13 ENCOUNTER — Encounter: Payer: Self-pay | Admitting: Physical Therapy

## 2019-02-13 ENCOUNTER — Other Ambulatory Visit: Payer: Self-pay

## 2019-02-13 ENCOUNTER — Ambulatory Visit: Payer: Medicare HMO | Attending: Surgical | Admitting: Physical Therapy

## 2019-02-13 DIAGNOSIS — R278 Other lack of coordination: Secondary | ICD-10-CM

## 2019-02-13 DIAGNOSIS — C50911 Malignant neoplasm of unspecified site of right female breast: Secondary | ICD-10-CM | POA: Diagnosis not present

## 2019-02-13 DIAGNOSIS — M6281 Muscle weakness (generalized): Secondary | ICD-10-CM | POA: Diagnosis not present

## 2019-02-13 DIAGNOSIS — Z171 Estrogen receptor negative status [ER-]: Secondary | ICD-10-CM | POA: Diagnosis not present

## 2019-02-13 DIAGNOSIS — C50912 Malignant neoplasm of unspecified site of left female breast: Secondary | ICD-10-CM | POA: Insufficient documentation

## 2019-02-13 DIAGNOSIS — R252 Cramp and spasm: Secondary | ICD-10-CM | POA: Insufficient documentation

## 2019-02-13 NOTE — Therapy (Signed)
[facility] [address] [place], [place], [address] Phone: [phone]   Fax:  [phone]  Physical Therapy Evaluation  Patient Details  Name: Monica [name] MRN: [id number] Date of Birth: Mar 15, 1946 Referring Provider (PT): Dr. Lurline Del   Encounter Date: 02/13/2019  PT End of Session - 02/13/19 1117    Visit Number  1    Date for PT Re-Evaluation  04/10/19    Authorization Type  [employer]na medicare    PT Start Time  1030    PT Stop Time  1108    PT Time Calculation (min)  38 min    Activity Tolerance  Patient tolerated treatment well;No increased pain    Behavior During Therapy  WFL for tasks assessed/performed       Past Medical History:  Diagnosis Date  . Allergy codeine   rapid heart beat, cold sweat  . Aortic atherosclerosis ([place])   . Arthritis   . Breast cancer ([place]) 01/10/14   Left breast INVASIVE DUCTAL CARCINOMA STAGE 2  . Breast cancer ([place]) 2018   Right breast   . DDD (degenerative disc disease), lumbosacral   . DJD (degenerative joint disease)   . Grade I diastolic dysfunction A999333   noted on ECHO   . History of cardiomegaly 02/12/2014   Noted on CXR  . Hypertension   . Hyperthyroidism    medication called in today for hyperthroid  . LVH (left ventricular hypertrophy) 04/03/2014   Mild, noted on ECHO   . OA (osteoarthritis)   . Personal history of chemotherapy 2015   Left Breast Cancer  . Personal history of radiation therapy 2015   Left Breast Cancer  . Personal history of radiation therapy 2018   Right breast cancer   . PONV (postoperative nausea and vomiting)   . S/P radiation therapy 09/21/14 completed   left breast  . Spinal stenosis    Moderate    Past Surgical History:  Procedure Laterality Date  . ABDOMINAL HYSTERECTOMY  1992  . BREAST LUMPECTOMY Left 02/12/2014  . BREAST LUMPECTOMY Right 2018  . BREAST LUMPECTOMY WITH NEEDLE LOCALIZATION AND AXILLARY SENTINEL  LYMPH NODE BX Left 02/12/2014   Procedure: LEFT BREAST LUMPECTOMY WITH NEEDLE LOCALIZATION AND LEFT AXILLARY SENTINEL LYMPH NODE BX;  Surgeon: [name], MD;  Location: [place];  Service: General;  Laterality: Left;  . BREAST LUMPECTOMY WITH RADIOACTIVE SEED LOCALIZATION Right 04/02/2017   Procedure: BREAST LUMPECTOMY WITH RADIOACTIVE SEED LOCALIZATION;  Surgeon: Excell Seltzer, MD;  Location: [place];  Service: General;  Laterality: Right;  . COLONOSCOPY    . FOOT ARTHROPLASTY  1998   right  . KNEE ARTHROSCOPY  2012   right  . LUMBAR LAMINECTOMY  1991  . PORTACATH PLACEMENT Right 02/12/2014   Procedure: INSERTION PORT-A-CATH;  Surgeon: [name], MD;  Location: [place];  Service: General;  Laterality: Right;  . portacath removal    . TOTAL KNEE ARTHROPLASTY Left 09/20/2017   Procedure: LEFT TOTAL KNEE ARTHROPLASTY;  Surgeon: Gaynelle Arabian, MD;  [facility];  Service: Orthopedics;  Laterality: Left;  Adductor Block  . TOTAL KNEE ARTHROPLASTY Right 03/21/2018   Procedure: RIGHT TOTAL KNEE ARTHROPLASTY;  Surgeon: Gaynelle Arabian, MD;  [facility];  Service: Orthopedics;  Laterality: Right;    There were no vitals filed for this visit.   Subjective Assessment - 02/13/19 1032    Subjective  I have alot of vaginal dryness and  I have to lean forward and to the side to fully empty bladder.    Patient Stated Goals  reduce dryness    Currently in Pain?  Yes    Pain Score  8     Pain Location  Vagina    Pain Orientation  Mid    Pain Descriptors / Indicators  Burning    Pain Type  Acute pain    Pain Onset  More than a month ago    Pain Frequency  Intermittent    Aggravating Factors   during vaginal exam    Pain Relieving Factors  no vaginal exam    Multiple Pain Sites  No         [facility] PT Assessment - 02/13/19 0001      Assessment   Medical Diagnosis  C50.512 Malignant neoplasm of lower-outer quadrant  of left female breast unspecified estrogen recerptor status    Referring Provider (PT)  Dr. Sarajane Jews Magrinat    Onset Date/Surgical Date  --   chronic   Prior Therapy  None      Precautions   Precautions  Other (comment)    Precaution Comments  breast cancer      Restrictions   Weight Bearing Restrictions  No      Balance Screen   Has the patient fallen in the past 6 months  No    Has the patient had a decrease in activity level because of a fear of falling?   No    Is the patient reluctant to leave their home because of a fear of falling?   No      Home Film/video editor residence      Prior Function   Level of Independence  Independent      Cognition   Overall Cognitive Status  Within Functional Limits for tasks assessed      Posture/Postural Control   Posture/Postural Control  No significant limitations      ROM / Strength   AROM / PROM / Strength  AROM;PROM;Strength      Palpation   Palpation comment  tendernes suprapubic on the right,                 Objective measurements completed on examination: See above findings.    Pelvic Floor Special Questions - 02/13/19 0001    Urinary Leakage  No    Urinary urgency  No    Urinary frequency  leans to the left and fully empty the bladder.     Fecal incontinence  No    Falling out feeling (prolapse)  No    Skin Integrity  Intact;Other   dryness   Prolapse  Anterior Wall    Pelvic Floor Internal Exam  patient comfirms identification and approves PT to assess pelvic floor and treatment    Exam Type  Vaginal    Palpation  tenderness located in the levator ani and obturator internist    Strength  fair squeeze, definite lift    Strength # of seconds  2               PT Education - 02/13/19 1109    Education Details  vaginal moisturizers    Person(s) Educated  Patient    Methods  Explanation;Handout    Comprehension  Verbalized understanding       PT Short Term Goals -  02/13/19 1119      PT SHORT TERM GOAL #1   Title  independent with initial HEP    Time  4    Period  Weeks    Status  New    Target Date  03/13/19      PT SHORT TERM GOAL #2   Title  understand how to moisturize the vaginal tissue to reduce dryness    Time  4    Period  Weeks    Status  New    Target Date  03/13/19      PT SHORT TERM GOAL #3   Title  able to fully empty her bladder 25% of the time    Time  4    Period  Weeks    Status  New    Target Date  03/13/19        PT Long Term Goals - 02/13/19 1126      PT LONG TERM GOAL #1   Title  independent with HEP and understand how to progress herself    Baseline  --    Time  8    Period  Weeks    Status  New    Target Date  04/10/19      PT LONG TERM GOAL #2   Title  vaginal dryness improved >/= 75% due to using moisturizers    Time  8    Period  Weeks    Status  New    Target Date  04/10/19      PT LONG TERM GOAL #3   Title  tenderness with internal soft tissue work decreased >/= 75% so she will be able to tolerate a vaginal exam by her doctor    Baseline  ---    Time  8    Period  Weeks    Status  New    Target Date  04/10/19      PT LONG TERM GOAL #4   Title  able to fully empty her bladder due to pelvic floor muscle able to relax    Baseline  --    Time  8    Period  Weeks    Status  New    Target Date  04/10/19             Plan - 02/13/19 1110    Clinical Impression Statement  Patient is a 73 year old s/p breast cancer 2 times. Patient reports extreme vaginal dryness, difficulty emptying her bladder ang pain with vaginal exam. Patient reports she has to lean to the left when she feels she needs to empty her bladder further. Patient reports she is coming up for the vaginal exam and has pain during the exam. Pelvic floor strength is 3/5. Patient has palpable tenderness located on the right suprapubic area, bilateral levator ani and obturator internist. Patient has tightness in the pelvic floor  muscles and perineal body and introitus. Patient will benefit from skilled therapy to improve vaginal health, ability to relax the pelvic floor muscles to improve urination and reduce pain with vaginal exam.    Personal Factors and Comorbidities  Age;Comorbidity 1;Comorbidity 2    Comorbidities  s/P breast cancer 2x; hysterectomy;    Examination-Activity Limitations  Toileting    Stability/Clinical Decision Making  Evolving/Moderate complexity    Clinical Decision Making  Low    Rehab Potential  Excellent    PT Frequency  1x / week    PT Duration  8 weeks    PT Treatment/Interventions  Biofeedback;Therapeutic exercise;Moist Heat;Cryotherapy;Electrical Stimulation;Therapeutic activities;Patient/family education;Neuromuscular re-education;Manual techniques;Dry needling  PT Next Visit Plan  soft tissue work to the pelvic floor muscles, hip flexibility, bulging of the pelvic floor, diaphragmatic breathing    Consulted and Agree with Plan of Care  Patient       Patient will benefit from skilled therapeutic intervention in order to improve the following deficits and impairments:  Increased fascial restricitons, Increased muscle spasms, Pain, Impaired flexibility, Decreased mobility, Decreased strength  Visit Diagnosis: Muscle weakness (generalized) - Plan: PT plan of care cert/re-cert  Other lack of coordination - Plan: PT plan of care cert/re-cert  Cramp and spasm - Plan: PT plan of care cert/re-cert  Malignant neoplasm of both breasts in female, estrogen receptor negative, unspecified site of breast ([place]) - Plan: PT plan of care cert/re-cert     Problem List Patient Active Problem List   Diagnosis Date Noted  . OA (osteoarthritis) of knee 09/20/2017  . Malignant neoplasm of upper-inner quadrant of right breast in female, estrogen receptor positive ([place]) 02/24/2017  . Hot flashes 04/05/2015  . Other pancytopenia ([place]) 06/28/2014  . Corneal abrasion 06/27/2014  . Anemia associated  with chemotherapy 06/25/2014  . Thrombocytopenia ([place]) 06/25/2014  . Chemotherapy-induced neuropathy ([place]) 06/04/2014  . Decreased appetite 05/28/2014  . Body aches 05/28/2014  . Dyspepsia 05/28/2014  . Insomnia 05/02/2014  . UTI (urinary tract infection) 04/17/2014  . Febrile neutropenia ([place]) 04/15/2014  . Hypokalemia 04/15/2014  . Hypertension 04/15/2014  . Itchy eyes 04/09/2014  . Heart palpitations 04/04/2014  . Nausea without vomiting 03/26/2014  . Heartburn 03/26/2014  . Thrush 03/26/2014  . Breast cancer of lower-outer quadrant of left female breast ([place]) 01/12/2014    Earlie Counts, PT 02/13/19 11:39 AM   [facility] [address], [place], [place], [address] Phone: [phone]   Fax:  [phone]  Name: SAMERIA CONTRERAS MRN: [id number] Date of Birth: Oct 18, 1945

## 2019-02-13 NOTE — Patient Instructions (Addendum)
Moisturizers . They are used in the vagina to hydrate the mucous membrane that make up the vaginal canal. . Designed to keep a more normal acid balance (ph) . Once placed in the vagina, it will last between two to three days.  . Use 2-3 times per week at bedtime and last longer than 60 min. . Ingredients to avoid is glycerin and fragrance, can increase chance of infection . Should not be used just before sex due to causing irritation . Most are gels administered either in a tampon-shaped applicator or as a vaginal suppository. They are non-hormonal.   Types of Moisturizers . Samul Dada- drug store . Vitamin E vaginal suppositories- Whole foods, Amazon . Moist Again . Coconut oil- can break down condoms . Julva- (Do no use if on Tamoxifen) amazon . Yes moisturizer- amazon . NeuEve Silk , NeuEve Silver for menopausal or over 65 (if have severe vaginal atrophy or cancer treatments use NeuEve Silk for  1 month than move to The Pepsi)- [employer], MapleFlower.dk . Olive and Bee intimate cream- www.oliveandbee.com.au . Mae vaginal moisturizer- Amazon  Creams to use externally on the Vulva area  Albertson's (good for for cancer patients that had radiation to the area)- [place] or [employer].FlyingBasics.com.br  V-magic cream - amazon  Julva-amazon  Vital "V Wild Yam salve ( help moisturize and help with thinning vulvar area, does have Madelia by Damiva labial moisturizer ([place],    Things to avoid in the vaginal area . Do not use things to irritate the vulvar area . No lotions just specialized creams for the vulva area- Neogyn, V-magic, No soaps; can use Aveeno or Calendula cleanser if needed. Must be gentle . No deodorants . No douches . Good to sleep without underwear to let the vaginal area to air out . No scrubbing: spread the lips to let warm water rinse over labias and pat dry [facility] [address] [place], [place] [address] Phone # [phone] Fax [phone]

## 2019-02-14 DIAGNOSIS — M5416 Radiculopathy, lumbar region: Secondary | ICD-10-CM | POA: Diagnosis not present

## 2019-02-14 DIAGNOSIS — Z79899 Other long term (current) drug therapy: Secondary | ICD-10-CM | POA: Diagnosis not present

## 2019-02-14 DIAGNOSIS — R69 Illness, unspecified: Secondary | ICD-10-CM | POA: Diagnosis not present

## 2019-02-14 DIAGNOSIS — M5126 Other intervertebral disc displacement, lumbar region: Secondary | ICD-10-CM | POA: Diagnosis not present

## 2019-02-15 DIAGNOSIS — Z9013 Acquired absence of bilateral breasts and nipples: Secondary | ICD-10-CM | POA: Diagnosis not present

## 2019-02-15 DIAGNOSIS — Z9012 Acquired absence of left breast and nipple: Secondary | ICD-10-CM | POA: Diagnosis not present

## 2019-02-21 ENCOUNTER — Other Ambulatory Visit: Payer: Self-pay

## 2019-02-21 ENCOUNTER — Encounter: Payer: Self-pay | Admitting: Physical Therapy

## 2019-02-21 ENCOUNTER — Ambulatory Visit: Payer: Medicare HMO | Attending: Surgical | Admitting: Physical Therapy

## 2019-02-21 DIAGNOSIS — Z171 Estrogen receptor negative status [ER-]: Secondary | ICD-10-CM | POA: Insufficient documentation

## 2019-02-21 DIAGNOSIS — C50911 Malignant neoplasm of unspecified site of right female breast: Secondary | ICD-10-CM | POA: Diagnosis not present

## 2019-02-21 DIAGNOSIS — R252 Cramp and spasm: Secondary | ICD-10-CM

## 2019-02-21 DIAGNOSIS — R278 Other lack of coordination: Secondary | ICD-10-CM

## 2019-02-21 DIAGNOSIS — C50912 Malignant neoplasm of unspecified site of left female breast: Secondary | ICD-10-CM | POA: Insufficient documentation

## 2019-02-21 DIAGNOSIS — M6281 Muscle weakness (generalized): Secondary | ICD-10-CM | POA: Insufficient documentation

## 2019-02-21 NOTE — Therapy (Signed)
[facility] [address], [place], [place], [address] Phone: [phone]   Fax:  [phone]  Physical Therapy Treatment  Patient Details  Name: Monica [name] MRN: [id number] Date of Birth: Nov 17, 1945 Referring Provider (PT): Dr. Lurline Del   Encounter Date: 02/21/2019  PT End of Session - 02/21/19 1116    Visit Number  2    Date for PT Re-Evaluation  04/10/19    Authorization Type  [employer]etna medicare    PT Start Time  0930    PT Stop Time  1015    PT Time Calculation (min)  45 min    Activity Tolerance  Patient tolerated treatment well;No increased pain    Behavior During Therapy  WFL for tasks assessed/performed       Past Medical History:  Diagnosis Date  . Allergy codeine   rapid heart beat, cold sweat  . Aortic atherosclerosis ([place])   . Arthritis   . Breast cancer ([place]) 01/10/14   Left breast INVASIVE DUCTAL CARCINOMA STAGE 2  . Breast cancer ([place]) 2018   Right breast   . DDD (degenerative disc disease), lumbosacral   . DJD (degenerative joint disease)   . Grade I diastolic dysfunction A999333   noted on ECHO   . History of cardiomegaly 02/12/2014   Noted on CXR  . Hypertension   . Hyperthyroidism    medication called in today for hyperthroid  . LVH (left ventricular hypertrophy) 04/03/2014   Mild, noted on ECHO   . OA (osteoarthritis)   . Personal history of chemotherapy 2015   Left Breast Cancer  . Personal history of radiation therapy 2015   Left Breast Cancer  . Personal history of radiation therapy 2018   Right breast cancer   . PONV (postoperative nausea and vomiting)   . S/P radiation therapy 09/21/14 completed   left breast  . Spinal stenosis    Moderate    Past Surgical History:  Procedure Laterality Date  . ABDOMINAL HYSTERECTOMY  1992  . BREAST LUMPECTOMY Left 02/12/2014  . BREAST LUMPECTOMY Right 2018  . BREAST LUMPECTOMY WITH NEEDLE LOCALIZATION AND AXILLARY SENTINEL  LYMPH NODE BX Left 02/12/2014   Procedure: LEFT BREAST LUMPECTOMY WITH NEEDLE LOCALIZATION AND LEFT AXILLARY SENTINEL LYMPH NODE BX;  Surgeon: [name], MD;  Location: [facility];  Service: General;  Laterality: Left;  . BREAST LUMPECTOMY WITH RADIOACTIVE SEED LOCALIZATION Right 04/02/2017   Procedure: BREAST LUMPECTOMY WITH RADIOACTIVE SEED LOCALIZATION;  Surgeon: Excell Seltzer, MD;  Location: [place];  Service: General;  Laterality: Right;  . COLONOSCOPY    . FOOT ARTHROPLASTY  1998   right  . KNEE ARTHROSCOPY  2012   right  . LUMBAR LAMINECTOMY  1991  . PORTACATH PLACEMENT Right 02/12/2014   Procedure: INSERTION PORT-A-CATH;  Surgeon: [name], MD;  Location: [facility];  Service: General;  Laterality: Right;  . portacath removal    . TOTAL KNEE ARTHROPLASTY Left 09/20/2017   Procedure: LEFT TOTAL KNEE ARTHROPLASTY;  Surgeon: Gaynelle Arabian, MD;  [facility];  Service: Orthopedics;  Laterality: Left;  Adductor Block  . TOTAL KNEE ARTHROPLASTY Right 03/21/2018   Procedure: RIGHT TOTAL KNEE ARTHROPLASTY;  Surgeon: Gaynelle Arabian, MD;  [facility];  Service: Orthopedics;  Laterality: Right;    There were no vitals filed for this visit.  Subjective Assessment - 02/21/19 0935    Subjective  I could tell a difference but I was  sore. I have been using the gel to the outside. I do not have to bend over to empty my bladder more often. I do the treadmill 15-30 min. twice a day.    Patient Stated Goals  reduce dryness    Currently in Pain?  Yes    Pain Score  2     Pain Location  Vagina    Pain Orientation  Mid    Pain Descriptors / Indicators  Burning    Pain Type  Acute pain    Pain Onset  More than a month ago    Pain Frequency  Intermittent    Aggravating Factors   during vaginal exam    Pain Relieving Factors  no vaginal exam    Multiple Pain Sites  No                    Pelvic Floor  Special Questions - 02/21/19 0001    Pelvic Floor Internal Exam  patient comfirms identification and approves PT to assess pelvic floor and treatment    Exam Type  Vaginal    Palpation  thickness on the left levator ani and left urethra        [facility] Adult PT Treatment/Exercise - 02/21/19 0001      Lumbar Exercises: Stretches   Active Hamstring Stretch  Right;Left;2 reps;30 seconds   sitting   Single Knee to Chest Stretch  Right;Left;1 rep;20 seconds   supine   Lower Trunk Rotation  5 reps;10 seconds    Piriformis Stretch  Right;Left;2 reps;30 seconds   sitting   Other Lumbar Stretch Exercise  butterfly stretch holding for 30 seconds      Lumbar Exercises: Aerobic   Nustep  level 6; level 2 for 6 min while assessing patient      Manual Therapy   Manual Therapy  Internal Pelvic Floor;Myofascial release    Myofascial Release  around the bladder as performing release around the urethra    Internal Pelvic Floor  bil. urethra sphincter, bladder, obturator internist, levator ani             PT Education - 02/21/19 1017    Education Details  Access Code: [email or web address]    Person(s) Educated  Patient    Methods  Explanation;Demonstration;Verbal cues;Handout    Comprehension  Returned demonstration;Verbalized understanding       PT Short Term Goals - 02/21/19 0950      PT SHORT TERM GOAL #1   Title  independent with initial HEP    Time  4    Period  Weeks    Status  On-going    Target Date  03/13/19      PT SHORT TERM GOAL #2   Title  understand how to moisturize the vaginal tissue to reduce dryness    Time  4    Period  Weeks    Status  On-going    Target Date  03/13/19      PT SHORT TERM GOAL #3   Title  able to fully empty her bladder 25% of the time    Time  4    Period  Weeks    Status  Achieved    Target Date  03/13/19        PT Long Term Goals - 02/13/19 1126      PT LONG TERM GOAL #1   Title  independent with HEP and understand how to progress herself     Baseline  --  Time  8    Period  Weeks    Status  New    Target Date  04/10/19      PT LONG TERM GOAL #2   Title  vaginal dryness improved >/= 75% due to using moisturizers    Time  8    Period  Weeks    Status  New    Target Date  04/10/19      PT LONG TERM GOAL #3   Title  tenderness with internal soft tissue work decreased >/= 75% so she will be able to tolerate a vaginal exam by her doctor    Baseline  ---    Time  8    Period  Weeks    Status  New    Target Date  04/10/19      PT LONG TERM GOAL #4   Title  able to fully empty her bladder due to pelvic floor muscle able to relax    Baseline  --    Time  8    Period  Weeks    Status  New    Target Date  04/10/19            Plan - 02/21/19 0944    Clinical Impression Statement  Patient reports the moisturizer she places on her vaginal area has helped. She is able to empty her bladder 25% better. Patient has some tightness vaginally on the left urethra spincter and levator ani. patient has learned stretches to relax the pelvic floor. Patient will benefit from skilled therapy to improve vaginal health, ability to relax the pelvic floor muscles to improve urination and reduce pain with vaginal exam.    Personal Factors and Comorbidities  Age;Comorbidity 1;Comorbidity 2    Comorbidities  s/P breast cancer 2x; hysterectomy;    Examination-Activity Limitations  Toileting    Stability/Clinical Decision Making  Evolving/Moderate complexity    Rehab Potential  Excellent    PT Frequency  1x / week    PT Duration  8 weeks    PT Treatment/Interventions  Biofeedback;Therapeutic exercise;Moist Heat;Cryotherapy;Electrical Stimulation;Therapeutic activities;Patient/family education;Neuromuscular re-education;Manual techniques;Dry needling    PT Next Visit Plan  soft tissue work to the pelvic floor muscles,  bulging of the pelvic floor, diaphragmatic breathing    PT Home Exercise Plan  Access Code: [email or web address]    Recommended Other  Services  MD signed initial eval    Consulted and Agree with Plan of Care  Patient       Patient will benefit from skilled therapeutic intervention in order to improve the following deficits and impairments:  Increased fascial restricitons, Increased muscle spasms, Pain, Impaired flexibility, Decreased mobility, Decreased strength  Visit Diagnosis: Muscle weakness (generalized)  Other lack of coordination  Cramp and spasm  Malignant neoplasm of both breasts in female, estrogen receptor negative, unspecified site of breast [facility])     Problem List Patient Active Problem List   Diagnosis Date Noted  . OA (osteoarthritis) of knee 09/20/2017  . Malignant neoplasm of upper-inner quadrant of right breast in female, estrogen receptor positive ([place]) 02/24/2017  . Hot flashes 04/05/2015  . Other pancytopenia ([place]) 06/28/2014  . Corneal abrasion 06/27/2014  . Anemia associated with chemotherapy 06/25/2014  . Thrombocytopenia ([place]) 06/25/2014  . Chemotherapy-induced neuropathy ([place]) 06/04/2014  . Decreased appetite 05/28/2014  . Body aches 05/28/2014  . Dyspepsia 05/28/2014  . Insomnia 05/02/2014  . UTI (urinary tract infection) 04/17/2014  . Febrile neutropenia ([place]) 04/15/2014  . Hypokalemia 04/15/2014  . Hypertension  04/15/2014  . Itchy eyes 04/09/2014  . Heart palpitations 04/04/2014  . Nausea without vomiting 03/26/2014  . Heartburn 03/26/2014  . Thrush 03/26/2014  . Breast cancer of lower-outer quadrant of left female breast ([place]) 01/12/2014    Earlie Counts, PT 02/21/19 11:22 AM   [facility] [address], [place], [place], [address] Phone: [phone]   Fax:  [phone]  Name: Monica [name] MRN: [id number] Date of Birth: [birth year]

## 2019-02-21 NOTE — Patient Instructions (Signed)
Access Code: [email or web address]  URL: [email or web address]  Date: 02/21/2019  Prepared by: Earlie Counts   Exercises Seated Hamstring Stretch - 2 reps - 1 sets - 30 sec hold - 1x daily - 7x weekly Seated Piriformis Stretch with Trunk Bend - 2 reps - 1 sets - 30 sec hold - 1x daily - 7x weekly Hooklying Single Knee to Chest - 2 reps - 1 sets - 15 sec hold - 1x daily - 7x weekly Supine Butterfly Groin Stretch - 2 reps - 1 sets - 30 sec hold - 1x daily - 7x weekly Supine Lower Trunk Rotation - 10 reps - 1 sets - 1x daily - 7x weekly [facility] [address] [place], [place] [address] Phone # [phone] Fax [phone]

## 2019-02-22 ENCOUNTER — Ambulatory Visit: Payer: Medicare HMO | Admitting: Physical Therapy

## 2019-03-01 ENCOUNTER — Ambulatory Visit: Payer: Medicare HMO | Admitting: Physical Therapy

## 2019-03-01 ENCOUNTER — Other Ambulatory Visit: Payer: Self-pay

## 2019-03-01 ENCOUNTER — Encounter: Payer: Self-pay | Admitting: Physical Therapy

## 2019-03-01 DIAGNOSIS — C50911 Malignant neoplasm of unspecified site of right female breast: Secondary | ICD-10-CM

## 2019-03-01 DIAGNOSIS — Z171 Estrogen receptor negative status [ER-]: Secondary | ICD-10-CM | POA: Diagnosis not present

## 2019-03-01 DIAGNOSIS — R252 Cramp and spasm: Secondary | ICD-10-CM | POA: Diagnosis not present

## 2019-03-01 DIAGNOSIS — M6281 Muscle weakness (generalized): Secondary | ICD-10-CM

## 2019-03-01 DIAGNOSIS — R278 Other lack of coordination: Secondary | ICD-10-CM | POA: Diagnosis not present

## 2019-03-01 DIAGNOSIS — C50912 Malignant neoplasm of unspecified site of left female breast: Secondary | ICD-10-CM | POA: Diagnosis not present

## 2019-03-01 NOTE — Therapy (Signed)
[facility] [address], [place], [place], [address] Phone: [phone]   Fax:  [phone]  Physical Therapy Treatment  Patient Details  Name: Monica [name] MRN: [id number] Date of Birth: 12-31-1945 Referring Provider (PT): Dr. Lurline Del   Encounter Date: 03/01/2019  PT End of Session - 03/01/19 1121    Visit Number  3    Date for PT Re-Evaluation  04/10/19    Authorization Type  [employer] medicare    PT Start Time  1114    PT Stop Time  1153    PT Time Calculation (min)  39 min    Activity Tolerance  Patient tolerated treatment well;No increased pain    Behavior During Therapy  WFL for tasks assessed/performed       Past Medical History:  Diagnosis Date  . Allergy codeine   rapid heart beat, cold sweat  . Aortic atherosclerosis ([place])   . Arthritis   . Breast cancer ([place]) 01/10/14   Left breast INVASIVE DUCTAL CARCINOMA STAGE 2  . Breast cancer ([place]) 2018   Right breast   . DDD (degenerative disc disease), lumbosacral   . DJD (degenerative joint disease)   . Grade I diastolic dysfunction [id number]   noted on ECHO   . History of cardiomegaly 02/12/2014   Noted on CXR  . Hypertension   . Hyperthyroidism    medication called in today for hyperthroid  . LVH (left ventricular hypertrophy) 04/03/2014   Mild, noted on ECHO   . OA (osteoarthritis)   . Personal history of chemotherapy 2015   Left Breast Cancer  . Personal history of radiation therapy 2015   Left Breast Cancer  . Personal history of radiation therapy 2018   Right breast cancer   . PONV (postoperative nausea and vomiting)   . S/P radiation therapy 09/21/14 completed   left breast  . Spinal stenosis    Moderate    Past Surgical History:  Procedure Laterality Date  . ABDOMINAL HYSTERECTOMY  1992  . BREAST LUMPECTOMY Left 02/12/2014  . BREAST LUMPECTOMY Right 2018  . BREAST LUMPECTOMY WITH NEEDLE LOCALIZATION AND AXILLARY SENTINEL  LYMPH NODE BX Left 02/12/2014   Procedure: LEFT BREAST LUMPECTOMY WITH NEEDLE LOCALIZATION AND LEFT AXILLARY SENTINEL LYMPH NODE BX;  Surgeon: [name], MD;  Location: [place];  Service: General;  Laterality: Left;  . BREAST LUMPECTOMY WITH RADIOACTIVE SEED LOCALIZATION Right 04/02/2017   Procedure: BREAST LUMPECTOMY WITH RADIOACTIVE SEED LOCALIZATION;  Surgeon: Excell Seltzer, MD;  Location: [place];  Service: General;  Laterality: Right;  . COLONOSCOPY    . FOOT ARTHROPLASTY  1998   right  . KNEE ARTHROSCOPY  2012   right  . LUMBAR LAMINECTOMY  1991  . PORTACATH PLACEMENT Right 02/12/2014   Procedure: INSERTION PORT-A-CATH;  Surgeon: [name], MD;  Location: [place];  Service: General;  Laterality: Right;  . portacath removal    . TOTAL KNEE ARTHROPLASTY Left 09/20/2017   Procedure: LEFT TOTAL KNEE ARTHROPLASTY;  Surgeon: Gaynelle Arabian, MD;  [facility];  Service: Orthopedics;  Laterality: Left;  Adductor Block  . TOTAL KNEE ARTHROPLASTY Right 03/21/2018   Procedure: RIGHT TOTAL KNEE ARTHROPLASTY;  Surgeon: Gaynelle Arabian, MD;  [facility];  Service: Orthopedics;  Laterality: Right;    There were no vitals filed for this visit.  Subjective Assessment - 03/01/19 1115    Subjective  I am gettng much better. The urine flow  is 70% better. When I use the soap I start to itch and it stops.    Patient Stated Goals  reduce dryness    Currently in Pain?  Yes    Pain Score  2     Pain Location  Vagina    Pain Orientation  Mid    Pain Descriptors / Indicators  Burning    Pain Type  Acute pain    Pain Onset  More than a month ago    Pain Frequency  Intermittent    Aggravating Factors   during vaginal exam    Pain Relieving Factors  no vaginal exam    Multiple Pain Sites  No                       [facility] Adult PT Treatment/Exercise - 03/01/19 0001      Self-Care   Self-Care  Other  Self-Care Comments    Other Self-Care Comments   discussed using the aloe vera for the vaginal dryness and it is feeling better, using soap and not to use the samples anymore and instead using the DOVE      Lumbar Exercises: Stretches   Active Hamstring Stretch  Right;Left;2 reps;30 seconds   sitting   Single Knee to Chest Stretch  Right;Left;1 rep;20 seconds   supine   Lower Trunk Rotation  5 reps;10 seconds      Manual Therapy   Manual Therapy  Soft tissue mobilization;Myofascial release    Manual therapy comments  instructed patient on scar massage for the lower abdominal     Soft tissue mobilization  along the hysterectomy scar to improved mobitliy    Myofascial Release  around the umbilicus, around the bladder, fascial release of the sac of Douglas, along the pubovesical ligaments             PT Education - 03/01/19 1153    Education Details  scar massage    Person(s) Educated  Patient    Methods  Explanation;Demonstration;Handout    Comprehension  Verbalized understanding;Returned demonstration       PT Short Term Goals - 03/01/19 1124      PT SHORT TERM GOAL #1   Title  independent with initial HEP    Time  4    Period  Weeks    Status  Achieved    Target Date  03/13/19      PT SHORT TERM GOAL #2   Title  understand how to moisturize the vaginal tissue to reduce dryness    Time  4    Period  Weeks    Status  Achieved    Target Date  03/13/19      PT SHORT TERM GOAL #3   Title  able to fully empty her bladder 25% of the time    Time  4    Period  Weeks    Status  Achieved    Target Date  03/13/19        PT Long Term Goals - 03/01/19 1157      PT LONG TERM GOAL #1   Title  independent with HEP and understand how to progress herself    Time  8    Period  Weeks    Status  On-going      PT LONG TERM GOAL #2   Title  vaginal dryness improved >/= 75% due to using moisturizers    Time  8    Period  Weeks  Status  On-going      PT LONG TERM GOAL  #3   Title  tenderness with internal soft tissue work decreased >/= 75% so she will be able to tolerate a vaginal exam by her doctor    Time  8    Period  Weeks    Status  On-going      PT LONG TERM GOAL #4   Title  able to fully empty her bladder due to pelvic floor muscle able to relax    Time  8    Period  Weeks    Status  On-going            Plan - 03/01/19 1121    Clinical Impression Statement  Patient is able to empty her bladder with 70% greater ease. Patient has fascial restrictions along the lower abdomen and umbilicus. Patient understands how to perform scar tissue massage. Patient tried a vagianl wash but irritated her and she will stop using it. Patient feels the vaginal dryness is improving and understands to use the moisturize daily. Patient will benefit from skilled therapy to improve vaginal health, ability to relax the pelvic floor muscles to improve urination and reduce pain with vaginal exam.    Personal Factors and Comorbidities  Age;Comorbidity 1;Comorbidity 2    Comorbidities  s/P breast cancer 2x; hysterectomy;    Examination-Activity Limitations  Toileting    Stability/Clinical Decision Making  Evolving/Moderate complexity    Rehab Potential  Excellent    PT Frequency  1x / week    PT Duration  8 weeks    PT Treatment/Interventions  Biofeedback;Therapeutic exercise;Moist Heat;Cryotherapy;Electrical Stimulation;Therapeutic activities;Patient/family education;Neuromuscular re-education;Manual techniques;Dry needling    PT Next Visit Plan  soft tissue work to the pelvic floor muscles,  bulging of the pelvic floor, diaphragmatic breathing    PT Home Exercise Plan  Access Code: [email or web address]    Consulted and Agree with Plan of Care  Patient       Patient will benefit from skilled therapeutic intervention in order to improve the following deficits and impairments:  Increased fascial restricitons, Increased muscle spasms, Pain, Impaired flexibility, Decreased mobility,  Decreased strength  Visit Diagnosis: Muscle weakness (generalized)  Other lack of coordination  Cramp and spasm  Malignant neoplasm of both breasts in female, estrogen receptor negative, unspecified site of breast [facility])     Problem List Patient Active Problem List   Diagnosis Date Noted  . OA (osteoarthritis) of knee 09/20/2017  . Malignant neoplasm of upper-inner quadrant of right breast in female, estrogen receptor positive ([place]) 02/24/2017  . Hot flashes 04/05/2015  . Other pancytopenia ([place]) 06/28/2014  . Corneal abrasion 06/27/2014  . Anemia associated with chemotherapy 06/25/2014  . Thrombocytopenia ([place]) 06/25/2014  . Chemotherapy-induced neuropathy ([place]) 06/04/2014  . Decreased appetite 05/28/2014  . Body aches 05/28/2014  . Dyspepsia 05/28/2014  . Insomnia 05/02/2014  . UTI (urinary tract infection) 04/17/2014  . Febrile neutropenia ([place]) 04/15/2014  . Hypokalemia 04/15/2014  . Hypertension 04/15/2014  . Itchy eyes 04/09/2014  . Heart palpitations 04/04/2014  . Nausea without vomiting 03/26/2014  . Heartburn 03/26/2014  . Thrush 03/26/2014  . Breast cancer of lower-outer quadrant of left female breast ([place]) 01/12/2014    Earlie Counts, PT 03/01/19 11:59 AM    [facility] [address] [place], [place], [address] Phone: [phone]   Fax:  [phone]  Name: Monica [name] MRN: [id number] Date of Birth: 11/13/1945

## 2019-03-08 ENCOUNTER — Ambulatory Visit: Payer: Medicare HMO | Admitting: Physical Therapy

## 2019-03-08 ENCOUNTER — Other Ambulatory Visit: Payer: Self-pay

## 2019-03-08 ENCOUNTER — Encounter: Payer: Self-pay | Admitting: Physical Therapy

## 2019-03-08 DIAGNOSIS — Z171 Estrogen receptor negative status [ER-]: Secondary | ICD-10-CM | POA: Diagnosis not present

## 2019-03-08 DIAGNOSIS — R278 Other lack of coordination: Secondary | ICD-10-CM | POA: Diagnosis not present

## 2019-03-08 DIAGNOSIS — M6281 Muscle weakness (generalized): Secondary | ICD-10-CM | POA: Diagnosis not present

## 2019-03-08 DIAGNOSIS — C50911 Malignant neoplasm of unspecified site of right female breast: Secondary | ICD-10-CM | POA: Diagnosis not present

## 2019-03-08 DIAGNOSIS — C50912 Malignant neoplasm of unspecified site of left female breast: Secondary | ICD-10-CM | POA: Diagnosis not present

## 2019-03-08 DIAGNOSIS — Z23 Encounter for immunization: Secondary | ICD-10-CM | POA: Diagnosis not present

## 2019-03-08 DIAGNOSIS — R252 Cramp and spasm: Secondary | ICD-10-CM

## 2019-03-08 NOTE — Therapy (Signed)
[facility] [address], [place], [place], [address] Phone: [phone]   Fax:  [phone]  Physical Therapy Treatment  Patient Details  Name: Monica [name] MRN: [id number] Date of Birth: 1945/09/09 Referring Provider (PT): Dr. Lurline Del   Encounter Date: 03/08/2019  PT End of Session - 03/08/19 1110    Visit Number  4    Date for PT Re-Evaluation  04/10/19    Authorization Type  [employer]na medicare    PT Start Time  1030    PT Stop Time  1108    PT Time Calculation (min)  38 min    Activity Tolerance  Patient tolerated treatment well;No increased pain    Behavior During Therapy  WFL for tasks assessed/performed       Past Medical History:  Diagnosis Date  . Allergy codeine   rapid heart beat, cold sweat  . Aortic atherosclerosis ([place])   . Arthritis   . Breast cancer ([place]) 01/10/14   Left breast INVASIVE DUCTAL CARCINOMA STAGE 2  . Breast cancer ([place]) 2018   Right breast   . DDD (degenerative disc disease), lumbosacral   . DJD (degenerative joint disease)   . Grade I diastolic dysfunction 72/53/6644   noted on ECHO   . History of cardiomegaly 02/12/2014   Noted on CXR  . Hypertension   . Hyperthyroidism    medication called in today for hyperthroid  . LVH (left ventricular hypertrophy) 04/03/2014   Mild, noted on ECHO   . OA (osteoarthritis)   . Personal history of chemotherapy 2015   Left Breast Cancer  . Personal history of radiation therapy 2015   Left Breast Cancer  . Personal history of radiation therapy 2018   Right breast cancer   . PONV (postoperative nausea and vomiting)   . S/P radiation therapy 09/21/14 completed   left breast  . Spinal stenosis    Moderate    Past Surgical History:  Procedure Laterality Date  . ABDOMINAL HYSTERECTOMY  1992  . BREAST LUMPECTOMY Left 02/12/2014  . BREAST LUMPECTOMY Right 2018  . BREAST LUMPECTOMY WITH NEEDLE LOCALIZATION AND AXILLARY SENTINEL  LYMPH NODE BX Left 02/12/2014   Procedure: LEFT BREAST LUMPECTOMY WITH NEEDLE LOCALIZATION AND LEFT AXILLARY SENTINEL LYMPH NODE BX;  Surgeon: [name], MD;  Location: [place];  Service: General;  Laterality: Left;  . BREAST LUMPECTOMY WITH RADIOACTIVE SEED LOCALIZATION Right 04/02/2017   Procedure: BREAST LUMPECTOMY WITH RADIOACTIVE SEED LOCALIZATION;  Surgeon: Excell Seltzer, MD;  Location: [place];  Service: General;  Laterality: Right;  . COLONOSCOPY    . FOOT ARTHROPLASTY  1998   right  . KNEE ARTHROSCOPY  2012   right  . LUMBAR LAMINECTOMY  1991  . PORTACATH PLACEMENT Right 02/12/2014   Procedure: INSERTION PORT-A-CATH;  Surgeon: [name], MD;  Location: [place];  Service: General;  Laterality: Right;  . portacath removal    . TOTAL KNEE ARTHROPLASTY Left 09/20/2017   Procedure: LEFT TOTAL KNEE ARTHROPLASTY;  Surgeon: Gaynelle Arabian, MD;  [facility];  Service: Orthopedics;  Laterality: Left;  Adductor Block  . TOTAL KNEE ARTHROPLASTY Right 03/21/2018   Procedure: RIGHT TOTAL KNEE ARTHROPLASTY;  Surgeon: Gaynelle Arabian, MD;  [facility];  Service: Orthopedics;  Laterality: Right;    There were no vitals filed for this visit.  Subjective Assessment - 03/08/19 1038    Subjective  After last session I could tell a difference.  The scar is looser and not as painful.  Urine flow is more powerful. I am having bowel movements daily now.    Patient Stated Goals  reduce dryness    Currently in Pain?  No/denies         [facility] PT Assessment - 03/08/19 0001      Assessment   Medical Diagnosis  C50.512 Malignant neoplasm of lower-outer quadrant of left female breast unspecified estrogen recerptor status    Referring Provider (PT)  Dr. Sarajane Jews Magrinat    Onset Date/Surgical Date  --   chronic   Prior Therapy  None      Precautions   Precautions  Other (comment)    Precaution Comments  breast cancer       Restrictions   Weight Bearing Restrictions  No      Home Environment   Living Environment  Private residence      Prior Function   Level of Independence  Independent      Cognition   Overall Cognitive Status  Within Functional Limits for tasks assessed                Pelvic Floor Special Questions - 03/08/19 0001    Pelvic Floor Internal Exam  patient comfirms identification and approves PT to assess pelvic floor and treatment    Exam Type  Vaginal    Strength  good squeeze, good lift, able to hold agaisnt strong resistance        [facility] Adult PT Treatment/Exercise - 03/08/19 0001      Lumbar Exercises: Aerobic   Nustep  level 2 for 8 min while assessing patient      Manual Therapy   Manual Therapy  Soft tissue mobilization;Internal Pelvic Floor;Myofascial release    Soft tissue mobilization  along the hysterectomy scar to improved mobitliy    Myofascial Release  around the umbilicus, around the bladder fascial release of the sac of Douglas, along the pubovesical ligaments    Internal Pelvic Floor  along the introitus to release the tissue               PT Short Term Goals - 03/01/19 1124      PT SHORT TERM GOAL #1   Title  independent with initial HEP    Time  4    Period  Weeks    Status  Achieved    Target Date  03/13/19      PT SHORT TERM GOAL #2   Title  understand how to moisturize the vaginal tissue to reduce dryness    Time  4    Period  Weeks    Status  Achieved    Target Date  03/13/19      PT SHORT TERM GOAL #3   Title  able to fully empty her bladder 25% of the time    Time  4    Period  Weeks    Status  Achieved    Target Date  03/13/19        PT Long Term Goals - 03/08/19 1041      PT LONG TERM GOAL #1   Title  independent with HEP and understand how to progress herself    Time  8    Period  Weeks    Status  Achieved      PT LONG TERM GOAL #2   Title  vaginal dryness improved >/= 75% due to using moisturizers     Time  8  Period  Weeks    Status  Achieved      PT LONG TERM GOAL #3   Title  tenderness with internal soft tissue work decreased >/= 75% so she will be able to tolerate a vaginal exam by her doctor    Time  8    Period  Weeks    Status  Achieved      PT LONG TERM GOAL #4   Title  able to fully empty her bladder due to pelvic floor muscle able to relax    Time  8    Period  Weeks    Status  Achieved            Plan - 03/08/19 1033    Clinical Impression Statement  Patient is not able to fully empty her bladder with a strong urine stream. Patient is able to hold her urine longer to get to the bathroom. Patient has no pain along the scar on her lower abdomen. Patient pelvic floor strength increased to 4/5. Patient is using a moisturizer vaginally which has improved her dryness. Patient is ready for discharge.    Personal Factors and Comorbidities  Age;Comorbidity 1;Comorbidity 2    Comorbidities  s/P breast cancer 2x; hysterectomy;    Examination-Activity Limitations  Toileting    Stability/Clinical Decision Making  Evolving/Moderate complexity    Rehab Potential  Excellent    PT Frequency  --    PT Duration  --    PT Treatment/Interventions  Biofeedback;Therapeutic exercise;Moist Heat;Cryotherapy;Electrical Stimulation;Therapeutic activities;Patient/family education;Neuromuscular re-education;Manual techniques;Dry needling    PT Next Visit Plan  discharge to HEP this visit    PT Home Exercise Plan  Access Code: [email or web address]    Consulted and Agree with Plan of Care  Patient       Patient will benefit from skilled therapeutic intervention in order to improve the following deficits and impairments:  Increased fascial restricitons, Increased muscle spasms, Pain, Impaired flexibility, Decreased mobility, Decreased strength  Visit Diagnosis: Muscle weakness (generalized)  Other lack of coordination  Cramp and spasm  Malignant neoplasm of both breasts in female, estrogen  receptor negative, unspecified site of breast [facility])     Problem List Patient Active Problem List   Diagnosis Date Noted  . OA (osteoarthritis) of knee 09/20/2017  . Malignant neoplasm of upper-inner quadrant of right breast in female, estrogen receptor positive ([place]) 02/24/2017  . Hot flashes 04/05/2015  . Other pancytopenia ([place]) 06/28/2014  . Corneal abrasion 06/27/2014  . Anemia associated with chemotherapy 06/25/2014  . Thrombocytopenia ([place]) 06/25/2014  . Chemotherapy-induced neuropathy ([place]) 06/04/2014  . Decreased appetite 05/28/2014  . Body aches 05/28/2014  . Dyspepsia 05/28/2014  . Insomnia 05/02/2014  . UTI (urinary tract infection) 04/17/2014  . Febrile neutropenia ([place]) 04/15/2014  . Hypokalemia 04/15/2014  . Hypertension 04/15/2014  . Itchy eyes 04/09/2014  . Heart palpitations 04/04/2014  . Nausea without vomiting 03/26/2014  . Heartburn 03/26/2014  . Thrush 03/26/2014  . Breast cancer of lower-outer quadrant of left female breast ([place]) 01/12/2014    Earlie Counts, PT 03/08/19 11:13 AM    [facility] [address] [place], [place], [address] Phone: [phone]   Fax:  [phone]  Name: Monica [name] MRN: [id number] Date of Birth: 09-18-1945 PHYSICAL THERAPY DISCHARGE SUMMARY  Visits from Start of Care: 4  Current functional level related to goals / functional outcomes: See above. Has met all goals.   Remaining deficits: See above.    Education /  Equipment:  HEP    Plan: Patient agrees to discharge.  Patient goals were met. Patient is being discharged due to meeting the stated rehab goals. Thank you for the referral. Earlie Counts, PT 03/08/19 11:14 AM   ?????

## 2019-03-15 ENCOUNTER — Encounter: Payer: Medicare HMO | Admitting: Physical Therapy

## 2019-03-15 DIAGNOSIS — H60542 Acute eczematoid otitis externa, left ear: Secondary | ICD-10-CM | POA: Diagnosis not present

## 2019-03-22 ENCOUNTER — Encounter: Payer: Medicare HMO | Admitting: Physical Therapy

## 2019-03-22 DIAGNOSIS — Z6837 Body mass index (BMI) 37.0-37.9, adult: Secondary | ICD-10-CM | POA: Diagnosis not present

## 2019-03-22 DIAGNOSIS — Z124 Encounter for screening for malignant neoplasm of cervix: Secondary | ICD-10-CM | POA: Diagnosis not present

## 2019-03-22 DIAGNOSIS — N760 Acute vaginitis: Secondary | ICD-10-CM | POA: Diagnosis not present

## 2019-04-10 DIAGNOSIS — D709 Neutropenia, unspecified: Secondary | ICD-10-CM | POA: Diagnosis not present

## 2019-04-10 DIAGNOSIS — M5126 Other intervertebral disc displacement, lumbar region: Secondary | ICD-10-CM | POA: Diagnosis not present

## 2019-04-10 DIAGNOSIS — L853 Xerosis cutis: Secondary | ICD-10-CM | POA: Diagnosis not present

## 2019-04-10 DIAGNOSIS — I10 Essential (primary) hypertension: Secondary | ICD-10-CM | POA: Diagnosis not present

## 2019-04-10 DIAGNOSIS — I700 Atherosclerosis of aorta: Secondary | ICD-10-CM | POA: Diagnosis not present

## 2019-04-10 DIAGNOSIS — M170 Bilateral primary osteoarthritis of knee: Secondary | ICD-10-CM | POA: Diagnosis not present

## 2019-04-10 DIAGNOSIS — Z853 Personal history of malignant neoplasm of breast: Secondary | ICD-10-CM | POA: Diagnosis not present

## 2019-04-10 DIAGNOSIS — E039 Hypothyroidism, unspecified: Secondary | ICD-10-CM | POA: Diagnosis not present

## 2019-05-16 DIAGNOSIS — Z79899 Other long term (current) drug therapy: Secondary | ICD-10-CM | POA: Diagnosis not present

## 2019-05-16 DIAGNOSIS — R69 Illness, unspecified: Secondary | ICD-10-CM | POA: Diagnosis not present

## 2019-05-16 DIAGNOSIS — Z6837 Body mass index (BMI) 37.0-37.9, adult: Secondary | ICD-10-CM | POA: Diagnosis not present

## 2019-05-16 DIAGNOSIS — M5416 Radiculopathy, lumbar region: Secondary | ICD-10-CM | POA: Diagnosis not present

## 2019-05-16 DIAGNOSIS — M5126 Other intervertebral disc displacement, lumbar region: Secondary | ICD-10-CM | POA: Diagnosis not present

## 2019-05-16 DIAGNOSIS — I10 Essential (primary) hypertension: Secondary | ICD-10-CM | POA: Diagnosis not present

## 2019-12-07 ENCOUNTER — Other Ambulatory Visit: Payer: Self-pay | Admitting: Oncology

## 2019-12-07 DIAGNOSIS — Z853 Personal history of malignant neoplasm of breast: Secondary | ICD-10-CM

## 2019-12-07 DIAGNOSIS — Z9889 Other specified postprocedural states: Secondary | ICD-10-CM

## 2019-12-20 ENCOUNTER — Ambulatory Visit
Admission: RE | Admit: 2019-12-20 | Discharge: 2019-12-20 | Disposition: A | Discharge status: Home / Self Care | Payer: Medicare HMO | Source: Ambulatory Visit | Attending: Oncology | Admitting: Oncology

## 2019-12-20 ENCOUNTER — Other Ambulatory Visit: Payer: Self-pay

## 2019-12-20 DIAGNOSIS — Z9889 Other specified postprocedural states: Secondary | ICD-10-CM

## 2019-12-20 DIAGNOSIS — Z853 Personal history of malignant neoplasm of breast: Secondary | ICD-10-CM

## 2020-02-13 ENCOUNTER — Other Ambulatory Visit: Payer: Self-pay | Admitting: Gastroenterology

## 2020-02-13 DIAGNOSIS — R1310 Dysphagia, unspecified: Secondary | ICD-10-CM

## 2020-02-19 ENCOUNTER — Ambulatory Visit
Admission: RE | Admit: 2020-02-19 | Discharge: 2020-02-19 | Disposition: A | Discharge status: Home / Self Care | Payer: Medicare HMO | Source: Ambulatory Visit | Attending: Gastroenterology | Admitting: Gastroenterology

## 2020-02-19 DIAGNOSIS — R1310 Dysphagia, unspecified: Secondary | ICD-10-CM

## 2020-11-29 ENCOUNTER — Other Ambulatory Visit: Payer: Self-pay | Admitting: Oncology

## 2020-11-29 ENCOUNTER — Other Ambulatory Visit: Payer: Self-pay | Admitting: Family Medicine

## 2020-11-29 DIAGNOSIS — Z9889 Other specified postprocedural states: Secondary | ICD-10-CM

## 2020-12-20 ENCOUNTER — Other Ambulatory Visit: Payer: Self-pay

## 2020-12-20 ENCOUNTER — Other Ambulatory Visit: Payer: Self-pay | Admitting: Oncology

## 2020-12-20 ENCOUNTER — Ambulatory Visit
Admission: RE | Admit: 2020-12-20 | Discharge: 2020-12-20 | Disposition: A | Discharge status: Home / Self Care | Payer: Medicare HMO | Source: Ambulatory Visit | Attending: Oncology | Admitting: Oncology

## 2020-12-20 DIAGNOSIS — Z9889 Other specified postprocedural states: Secondary | ICD-10-CM

## 2021-12-03 ENCOUNTER — Other Ambulatory Visit: Payer: Self-pay | Admitting: Family Medicine

## 2021-12-03 DIAGNOSIS — Z1231 Encounter for screening mammogram for malignant neoplasm of breast: Secondary | ICD-10-CM

## 2021-12-15 ENCOUNTER — Telehealth: Payer: Self-pay | Admitting: Podiatry

## 2021-12-15 ENCOUNTER — Ambulatory Visit (INDEPENDENT_AMBULATORY_CARE_PROVIDER_SITE_OTHER): Payer: Medicare Other | Admitting: Podiatry

## 2021-12-15 ENCOUNTER — Ambulatory Visit (INDEPENDENT_AMBULATORY_CARE_PROVIDER_SITE_OTHER): Payer: Medicare Other

## 2021-12-15 DIAGNOSIS — M2012 Hallux valgus (acquired), left foot: Secondary | ICD-10-CM

## 2021-12-15 DIAGNOSIS — L600 Ingrowing nail: Secondary | ICD-10-CM | POA: Diagnosis not present

## 2021-12-15 MED ORDER — GENTAMICIN SULFATE 0.1 % EX CREA: 1.0000 | TOPICAL_CREAM | Freq: Two times a day (BID) | CUTANEOUS | 1 refills | Status: AC

## 2021-12-15 NOTE — Progress Notes (Signed)
Subjective: Patient presents today for evaluation of pain to the lateral border of the left great toenail plate. Patient is concerned for possible ingrown nail.  It is very sensitive to touch.  Patient states that she began to notice it after she underwent chemotherapy which created permanent peripheral neuropathy as well as nail dystrophy especially to the left hallux nail plate.  Patient presents today for further treatment and evaluation.  Past Medical History:  Diagnosis Date   Allergy codeine   rapid heart beat, cold sweat   Aortic atherosclerosis ([facility])    Arthritis    Breast cancer ([facility]) 01/10/14   Left breast INVASIVE DUCTAL CARCINOMA STAGE 2   Breast cancer ([facility]) 2018   Right breast    DDD (degenerative disc disease), lumbosacral    DJD (degenerative joint disease)    Grade I diastolic dysfunction 04/03/2014   noted on ECHO    History of cardiomegaly 02/12/2014   Noted on CXR   Hypertension    Hyperthyroidism    medication called in today for hyperthroid   LVH (left ventricular hypertrophy) 04/03/2014   Mild, noted on ECHO    OA (osteoarthritis)    Personal history of chemotherapy 2015   Left Breast Cancer   Personal history of radiation therapy 2015   Left Breast Cancer   Personal history of radiation therapy 2018   Right breast cancer    PONV (postoperative nausea and vomiting)    S/P radiation therapy 09/21/14 completed   left breast   Spinal stenosis    Moderate   Past Surgical History:  Procedure Laterality Date   ABDOMINAL HYSTERECTOMY  1992   BREAST LUMPECTOMY Left 02/12/2014   BREAST LUMPECTOMY Right 2018   BREAST LUMPECTOMY WITH NEEDLE LOCALIZATION AND AXILLARY SENTINEL LYMPH NODE BX Left 02/12/2014   Procedure: LEFT BREAST LUMPECTOMY WITH NEEDLE LOCALIZATION AND LEFT AXILLARY SENTINEL LYMPH NODE BX;  Surgeon: Mariella Saa, MD;  Location: [facility];  Service: General;  Laterality: Left;   BREAST LUMPECTOMY WITH RADIOACTIVE SEED  LOCALIZATION Right 04/02/2017   Procedure: BREAST LUMPECTOMY WITH RADIOACTIVE SEED LOCALIZATION;  Surgeon: Glenna Fellows, MD;  Location: [facility];  Service: General;  Laterality: Right;   COLONOSCOPY     FOOT ARTHROPLASTY  1998   right   KNEE ARTHROSCOPY  2012   right   LUMBAR LAMINECTOMY  1991   PORTACATH PLACEMENT Right 02/12/2014   Procedure: INSERTION PORT-A-CATH;  Surgeon: Mariella Saa, MD;  Location: [facility];  Service: General;  Laterality: Right;   portacath removal     TOTAL KNEE ARTHROPLASTY Left 09/20/2017   Procedure: LEFT TOTAL KNEE ARTHROPLASTY;  Surgeon: Ollen Gross, MD;  [facility];  Service: Orthopedics;  Laterality: Left;  Adductor Block   TOTAL KNEE ARTHROPLASTY Right 03/21/2018   Procedure: RIGHT TOTAL KNEE ARTHROPLASTY;  Surgeon: Ollen Gross, MD;  [facility];  Service: Orthopedics;  Laterality: Right;   Allergies  Allergen Reactions   Codeine Palpitations and Other (See Comments)    Heart rate increased, cold sweats    Adhesive [Tape]     Pulls off skin   Tramadol Nausea And Vomiting    Upset stomach    Objective:  General: Well developed, nourished, in no acute distress, alert and oriented x3   Dermatology: Skin is warm, dry and supple bilateral.  Lateral border left hallux nail plate appears to be erythematous with evidence of an ingrowing nail. Pain on palpation noted to the border  of the nail fold. The remaining nails appear unremarkable at this time. There are no open sores, lesions.  Vascular: Dorsalis Pedis artery and Posterior Tibial artery pedal pulses palpable. No lower extremity edema noted.   Neruologic: Diminished via light touch bilateral.  Musculoskeletal: Moderate hallux valgus deformity noted with a prominent medial eminence of the first metatarsal  Radiographic exam LT foot 12/15/2021: Normal osseous mineralization.  Mild hallux valgus deformity noted with a increased intermetatarsal  angle  Assesement: #1 Paronychia with ingrowing nail lateral border left great toe #2  Mild to moderate hallux valgus left  Plan of Care:  1. Patient evaluated.  2. Discussed treatment alternatives and plan of care. Explained nail avulsion procedure and post procedure course to patient. 3. Patient opted for permanent partial nail avulsion of the ingrown portion of the nail.  4. Prior to procedure, local anesthesia infiltration utilized using 3 ml of a 50:50 mixture of 2% plain lidocaine and 0.5% plain marcaine in a normal hallux block fashion and a betadine prep performed.  5. Partial permanent nail avulsion with chemical matrixectomy performed using 3x30sec applications of phenol followed by alcohol flush.  6. Light dressing applied.  Post care instructions provided 7.  Prescription for gentamicin 2% cream  8.  In regards to the hallux valgus deformity, recommend conservative treatment.  The patient states that it is not painful enough to have surgery.  It is somewhat tolerable depending on the shoes that she wears.   9.  Return to clinic 2 weeks.  Felecia Shelling, DPM [facility]  Dr. Felecia Shelling, DPM    [address] [place], [place] [address]                Office [phone]  Fax [phone]

## 2021-12-30 ENCOUNTER — Ambulatory Visit
Admission: RE | Admit: 2021-12-30 | Discharge: 2021-12-30 | Disposition: A | Discharge status: Home / Self Care | Payer: Medicare Other | Source: Ambulatory Visit | Attending: Family Medicine | Admitting: Family Medicine

## 2021-12-30 DIAGNOSIS — Z1231 Encounter for screening mammogram for malignant neoplasm of breast: Secondary | ICD-10-CM

## 2022-01-07 ENCOUNTER — Ambulatory Visit (INDEPENDENT_AMBULATORY_CARE_PROVIDER_SITE_OTHER): Payer: Medicare Other | Admitting: Podiatry

## 2022-01-07 DIAGNOSIS — L600 Ingrowing nail: Secondary | ICD-10-CM

## 2022-01-07 NOTE — Progress Notes (Signed)
   Chief Complaint  Patient presents with   Follow-up    Left foot great toe ingrown follow-up.    Subjective: 76 y.o. female presents today status post permanent nail avulsion procedure of the lateral border left great toe that was performed on 12/15/2021.  Patient doing well.  She says that she no longer has any pain to the area.  She has been soaking her foot and applying the antibiotic cream as instructed.  No new complaints at this time  Past Medical History:  Diagnosis Date   Allergy codeine   rapid heart beat, cold sweat   Aortic atherosclerosis (HCC)    Arthritis    Breast cancer ([facility]) 01/10/14   Left breast INVASIVE DUCTAL CARCINOMA STAGE 2   Breast cancer ([place]) 2018   Right breast    DDD (degenerative disc disease), lumbosacral    DJD (degenerative joint disease)    Grade I diastolic dysfunction 37/90/2409   noted on ECHO    History of cardiomegaly 02/12/2014   Noted on CXR   Hypertension    Hyperthyroidism    medication called in today for hyperthroid   LVH (left ventricular hypertrophy) 04/03/2014   Mild, noted on ECHO    OA (osteoarthritis)    Personal history of chemotherapy 2015   Left Breast Cancer   Personal history of radiation therapy 2015   Left Breast Cancer   Personal history of radiation therapy 2018   Right breast cancer    PONV (postoperative nausea and vomiting)    S/P radiation therapy 09/21/14 completed   left breast   Spinal stenosis    Moderate    Objective: Skin is warm, dry and supple. Nail and respective nail fold appears to be healing appropriately. Open wound to the associated nail fold with a granular wound base and moderate amount of fibrotic tissue. Minimal drainage noted. Mild erythema around the periungual region likely due to phenol chemical matricectomy.  Assessment: #1 s/p partial permanent nail matrixectomy lateral border left great toe   Plan of care: #1 patient was evaluated  #2 light debridement of open wound was  performed to the periungual border of the respective toe using a currette. Antibiotic ointment and Band-Aid was applied. #3 patient is to return to clinic on a PRN basis.   Edrick Kins, DPM [facility]  Dr. Edrick Kins, DPM    [address], [place] [address]                Office [phone]  Fax [phone]

## 2022-04-14 ENCOUNTER — Other Ambulatory Visit: Payer: Self-pay | Admitting: Family Medicine

## 2022-04-14 DIAGNOSIS — I739 Peripheral vascular disease, unspecified: Secondary | ICD-10-CM

## 2022-04-27 ENCOUNTER — Ambulatory Visit
Admission: RE | Admit: 2022-04-27 | Discharge: 2022-04-27 | Disposition: A | Discharge status: Home / Self Care | Payer: Medicare Other | Source: Ambulatory Visit | Attending: Family Medicine | Admitting: Family Medicine

## 2022-04-27 DIAGNOSIS — I739 Peripheral vascular disease, unspecified: Secondary | ICD-10-CM

## 2023-02-16 ENCOUNTER — Other Ambulatory Visit: Payer: Self-pay | Admitting: Obstetrics and Gynecology

## 2023-02-16 DIAGNOSIS — Z0000 Encounter for general adult medical examination without abnormal findings: Secondary | ICD-10-CM

## 2023-02-18 ENCOUNTER — Ambulatory Visit
Admission: RE | Admit: 2023-02-18 | Discharge: 2023-02-18 | Disposition: A | Discharge status: Home / Self Care | Payer: Medicare Other | Source: Ambulatory Visit | Attending: Obstetrics and Gynecology | Admitting: Obstetrics and Gynecology

## 2023-02-18 DIAGNOSIS — Z0000 Encounter for general adult medical examination without abnormal findings: Secondary | ICD-10-CM

## 2024-01-21 ENCOUNTER — Other Ambulatory Visit: Payer: Self-pay | Admitting: Obstetrics and Gynecology

## 2024-01-21 DIAGNOSIS — Z1231 Encounter for screening mammogram for malignant neoplasm of breast: Secondary | ICD-10-CM

## 2024-02-16 ENCOUNTER — Other Ambulatory Visit (HOSPITAL_COMMUNITY): Payer: Self-pay

## 2024-02-22 ENCOUNTER — Ambulatory Visit
Admission: RE | Admit: 2024-02-22 | Discharge: 2024-02-22 | Disposition: A | Discharge status: Home / Self Care | Source: Ambulatory Visit | Attending: Obstetrics and Gynecology | Admitting: Obstetrics and Gynecology

## 2024-02-22 DIAGNOSIS — Z1231 Encounter for screening mammogram for malignant neoplasm of breast: Secondary | ICD-10-CM
